# Patient Record
Sex: Female | Born: 1940 | Race: Black or African American | Hispanic: No | Smoking: Never smoker
Health system: Southern US, Community
[De-identification: ages and names within clinical notes are randomized; demographics above are authoritative.]

## PROBLEM LIST (undated history)

## (undated) DIAGNOSIS — K219 Gastro-esophageal reflux disease without esophagitis: Secondary | ICD-10-CM

## (undated) DIAGNOSIS — T451X5A Adverse effect of antineoplastic and immunosuppressive drugs, initial encounter: Secondary | ICD-10-CM

## (undated) DIAGNOSIS — Z87440 Personal history of urinary (tract) infections: Secondary | ICD-10-CM

## (undated) DIAGNOSIS — Z8619 Personal history of other infectious and parasitic diseases: Secondary | ICD-10-CM

## (undated) DIAGNOSIS — E876 Hypokalemia: Secondary | ICD-10-CM

## (undated) DIAGNOSIS — Z972 Presence of dental prosthetic device (complete) (partial): Secondary | ICD-10-CM

## (undated) DIAGNOSIS — Z9484 Stem cells transplant status: Secondary | ICD-10-CM

## (undated) DIAGNOSIS — N200 Calculus of kidney: Secondary | ICD-10-CM

## (undated) DIAGNOSIS — Z973 Presence of spectacles and contact lenses: Secondary | ICD-10-CM

## (undated) DIAGNOSIS — M109 Gout, unspecified: Secondary | ICD-10-CM

## (undated) DIAGNOSIS — G620 Drug-induced polyneuropathy: Secondary | ICD-10-CM

## (undated) DIAGNOSIS — R197 Diarrhea, unspecified: Secondary | ICD-10-CM

## (undated) DIAGNOSIS — E785 Hyperlipidemia, unspecified: Secondary | ICD-10-CM

## (undated) DIAGNOSIS — N201 Calculus of ureter: Secondary | ICD-10-CM

## (undated) DIAGNOSIS — Z9221 Personal history of antineoplastic chemotherapy: Secondary | ICD-10-CM

## (undated) DIAGNOSIS — M1990 Unspecified osteoarthritis, unspecified site: Secondary | ICD-10-CM

## (undated) DIAGNOSIS — C9001 Multiple myeloma in remission: Secondary | ICD-10-CM

## (undated) HISTORY — DX: Gastro-esophageal reflux disease without esophagitis: K21.9

## (undated) HISTORY — PX: CARPAL TUNNEL RELEASE: SHX101

## (undated) HISTORY — DX: Unspecified osteoarthritis, unspecified site: M19.90

## (undated) HISTORY — DX: Hyperlipidemia, unspecified: E78.5

## (undated) HISTORY — PX: APPENDECTOMY: SHX54

---

## 1992-04-01 HISTORY — PX: VAGINAL HYSTERECTOMY: SUR661

## 1998-03-13 ENCOUNTER — Other Ambulatory Visit: Admission: RE | Admit: 1998-03-13 | Discharge: 1998-03-13 | Payer: Self-pay | Admitting: *Deleted

## 1999-03-08 ENCOUNTER — Encounter: Admission: RE | Admit: 1999-03-08 | Discharge: 1999-03-08 | Payer: Self-pay | Admitting: *Deleted

## 1999-03-29 ENCOUNTER — Encounter: Admission: RE | Admit: 1999-03-29 | Discharge: 1999-03-29 | Payer: Self-pay | Admitting: *Deleted

## 1999-04-19 ENCOUNTER — Other Ambulatory Visit: Admission: RE | Admit: 1999-04-19 | Discharge: 1999-04-19 | Payer: Self-pay | Admitting: *Deleted

## 1999-11-19 ENCOUNTER — Encounter: Admission: RE | Admit: 1999-11-19 | Discharge: 1999-11-19 | Payer: Self-pay | Admitting: *Deleted

## 2000-11-24 ENCOUNTER — Encounter: Admission: RE | Admit: 2000-11-24 | Discharge: 2000-11-24 | Payer: Self-pay | Admitting: Internal Medicine

## 2000-11-24 ENCOUNTER — Encounter: Payer: Self-pay | Admitting: Internal Medicine

## 2001-09-22 ENCOUNTER — Ambulatory Visit (HOSPITAL_COMMUNITY): Admission: RE | Admit: 2001-09-22 | Discharge: 2001-09-22 | Payer: Self-pay | Admitting: *Deleted

## 2001-09-22 ENCOUNTER — Encounter (INDEPENDENT_AMBULATORY_CARE_PROVIDER_SITE_OTHER): Payer: Self-pay | Admitting: Specialist

## 2001-10-14 ENCOUNTER — Ambulatory Visit (HOSPITAL_BASED_OUTPATIENT_CLINIC_OR_DEPARTMENT_OTHER): Admission: RE | Admit: 2001-10-14 | Discharge: 2001-10-14 | Payer: Self-pay | Admitting: *Deleted

## 2001-12-11 ENCOUNTER — Encounter: Payer: Self-pay | Admitting: Internal Medicine

## 2001-12-11 ENCOUNTER — Encounter: Admission: RE | Admit: 2001-12-11 | Discharge: 2001-12-11 | Payer: Self-pay | Admitting: Internal Medicine

## 2005-06-17 ENCOUNTER — Encounter: Admission: RE | Admit: 2005-06-17 | Discharge: 2005-06-17 | Payer: Self-pay | Admitting: Internal Medicine

## 2007-09-23 ENCOUNTER — Encounter: Admission: RE | Admit: 2007-09-23 | Discharge: 2007-09-23 | Payer: Self-pay | Admitting: Internal Medicine

## 2008-06-04 ENCOUNTER — Inpatient Hospital Stay (HOSPITAL_COMMUNITY): Admission: EM | Admit: 2008-06-04 | Discharge: 2008-06-05 | Payer: Self-pay | Admitting: Emergency Medicine

## 2008-06-05 ENCOUNTER — Encounter (INDEPENDENT_AMBULATORY_CARE_PROVIDER_SITE_OTHER): Payer: Self-pay | Admitting: Cardiology

## 2008-12-23 ENCOUNTER — Encounter: Admission: RE | Admit: 2008-12-23 | Discharge: 2008-12-23 | Payer: Self-pay | Admitting: Internal Medicine

## 2009-01-31 ENCOUNTER — Ambulatory Visit: Payer: Self-pay | Admitting: Oncology

## 2009-02-03 LAB — CBC & DIFF AND RETIC
BASO%: 0.2 % (ref 0.0–2.0)
Basophils Absolute: 0 10*3/uL (ref 0.0–0.1)
EOS%: 0.6 % (ref 0.0–7.0)
Eosinophils Absolute: 0 10*3/uL (ref 0.0–0.5)
HCT: 31.3 % — ABNORMAL LOW (ref 34.8–46.6)
HGB: 10.1 g/dL — ABNORMAL LOW (ref 11.6–15.9)
Immature Retic Fract: 14.7 % — ABNORMAL HIGH (ref 0.00–10.70)
LYMPH%: 44.1 % (ref 14.0–49.7)
MCH: 30.9 pg (ref 25.1–34.0)
MCHC: 32.3 g/dL (ref 31.5–36.0)
MCV: 95.7 fL (ref 79.5–101.0)
MONO#: 0.3 10*3/uL (ref 0.1–0.9)
MONO%: 5.4 % (ref 0.0–14.0)
NEUT#: 2.7 10*3/uL (ref 1.5–6.5)
NEUT%: 49.7 % (ref 38.4–76.8)
Platelets: 222 10*3/uL (ref 145–400)
RBC: 3.27 10*6/uL — ABNORMAL LOW (ref 3.70–5.45)
RDW: 14 % (ref 11.2–14.5)
Retic %: 1.54 % — ABNORMAL HIGH (ref 0.50–1.50)
Retic Ct Abs: 50.36 10*3/uL (ref 18.30–72.70)
WBC: 5.4 10*3/uL (ref 3.9–10.3)
lymph#: 2.4 10*3/uL (ref 0.9–3.3)
nRBC: 0 % (ref 0–0)

## 2009-02-03 LAB — SEDIMENTATION RATE: Sed Rate: 31 mm/hr — ABNORMAL HIGH (ref 0–22)

## 2009-02-03 LAB — CHCC SMEAR

## 2009-02-08 LAB — LACTATE DEHYDROGENASE: LDH: 225 U/L (ref 94–250)

## 2009-02-08 LAB — IMMUNOFIXATION ELECTROPHORESIS
IgA: 68 mg/dL (ref 68–378)
IgG (Immunoglobin G), Serum: 868 mg/dL (ref 694–1618)
IgM, Serum: 22 mg/dL — ABNORMAL LOW (ref 60–263)
Total Protein, Serum Electrophoresis: 6.9 g/dL (ref 6.0–8.3)

## 2009-02-08 LAB — COMPREHENSIVE METABOLIC PANEL
ALT: 15 U/L (ref 0–35)
AST: 16 U/L (ref 0–37)
Albumin: 4.7 g/dL (ref 3.5–5.2)
Alkaline Phosphatase: 69 U/L (ref 39–117)
BUN: 13 mg/dL (ref 6–23)
CO2: 27 mEq/L (ref 19–32)
Calcium: 9.9 mg/dL (ref 8.4–10.5)
Chloride: 106 mEq/L (ref 96–112)
Creatinine, Ser: 0.93 mg/dL (ref 0.40–1.20)
Glucose, Bld: 109 mg/dL — ABNORMAL HIGH (ref 70–99)
Potassium: 4.1 mEq/L (ref 3.5–5.3)
Sodium: 144 mEq/L (ref 135–145)
Total Bilirubin: 0.4 mg/dL (ref 0.3–1.2)
Total Protein: 6.9 g/dL (ref 6.0–8.3)

## 2009-02-08 LAB — FOLATE RBC: RBC Folate: 855 ng/mL — ABNORMAL HIGH (ref 180–600)

## 2009-02-08 LAB — IRON AND TIBC
%SAT: 25 % (ref 20–55)
Iron: 75 ug/dL (ref 42–145)
TIBC: 297 ug/dL (ref 250–470)
UIBC: 222 ug/dL

## 2009-02-08 LAB — FERRITIN: Ferritin: 359 ng/mL — ABNORMAL HIGH (ref 10–291)

## 2009-02-08 LAB — VITAMIN B12: Vitamin B-12: 371 pg/mL (ref 211–911)

## 2009-02-08 LAB — TRANSFERRIN RECEPTOR, SOLUABLE: Transferrin Receptor, Soluble: 17.8 nmol/L

## 2009-03-03 ENCOUNTER — Ambulatory Visit: Payer: Self-pay | Admitting: Oncology

## 2009-03-03 LAB — CBC WITH DIFFERENTIAL/PLATELET
BASO%: 0.2 % (ref 0.0–2.0)
Basophils Absolute: 0 10*3/uL (ref 0.0–0.1)
EOS%: 0.7 % (ref 0.0–7.0)
Eosinophils Absolute: 0 10*3/uL (ref 0.0–0.5)
HCT: 30.9 % — ABNORMAL LOW (ref 34.8–46.6)
HGB: 9.8 g/dL — ABNORMAL LOW (ref 11.6–15.9)
LYMPH%: 47.3 % (ref 14.0–49.7)
MCH: 30.5 pg (ref 25.1–34.0)
MCHC: 31.7 g/dL (ref 31.5–36.0)
MCV: 96.3 fL (ref 79.5–101.0)
MONO#: 0.3 10*3/uL (ref 0.1–0.9)
MONO%: 5.7 % (ref 0.0–14.0)
NEUT#: 2.6 10*3/uL (ref 1.5–6.5)
NEUT%: 46.1 % (ref 38.4–76.8)
Platelets: 199 10*3/uL (ref 145–400)
RBC: 3.21 10*6/uL — ABNORMAL LOW (ref 3.70–5.45)
RDW: 14.3 % (ref 11.2–14.5)
WBC: 5.6 10*3/uL (ref 3.9–10.3)
lymph#: 2.7 10*3/uL (ref 0.9–3.3)
nRBC: 0 % (ref 0–0)

## 2009-03-09 LAB — UIFE/LIGHT CHAINS/TP QN, 24-HR UR
Albumin, U: DETECTED
Alpha 1, Urine: DETECTED — AB
Alpha 2, Urine: DETECTED — AB
Beta, Urine: DETECTED — AB
Free Kappa Lt Chains,Ur: 818 mg/dL — ABNORMAL HIGH (ref 0.04–1.51)
Free Kappa/Lambda Ratio: 1573.08 ratio — ABNORMAL HIGH (ref 0.46–4.00)
Free Lambda Excretion/Day: 9.36 mg/d
Free Lambda Lt Chains,Ur: 0.52 mg/dL (ref 0.08–1.01)
Free Lt Chn Excr Rate: 14724 mg/d
Gamma Globulin, Urine: DETECTED — AB
Time: 24 hours
Total Protein, Urine-Ur/day: 14796 mg/d — ABNORMAL HIGH (ref 10–140)
Total Protein, Urine: 822 mg/dL
Volume, Urine: 1800 mL

## 2009-03-09 LAB — CREATININE CLEARANCE, URINE, 24 HOUR
Collection Interval-CRCL: 24 hours
Creatinine Clearance: 115 mL/min (ref 75–115)
Creatinine, 24H Ur: 1628 mg/d (ref 700–1800)
Creatinine, Urine: 97.2 mg/dL
Creatinine: 0.98 mg/dL (ref 0.40–1.20)
Urine Total Volume-CRCL: 1675 mL

## 2009-03-30 ENCOUNTER — Ambulatory Visit (HOSPITAL_COMMUNITY): Admission: RE | Admit: 2009-03-30 | Discharge: 2009-03-30 | Payer: Self-pay | Admitting: Oncology

## 2009-03-30 ENCOUNTER — Ambulatory Visit: Payer: Self-pay | Admitting: Oncology

## 2009-04-05 ENCOUNTER — Ambulatory Visit: Payer: Self-pay | Admitting: Oncology

## 2009-04-07 LAB — CBC WITH DIFFERENTIAL/PLATELET
BASO%: 0.1 % (ref 0.0–2.0)
Basophils Absolute: 0 10*3/uL (ref 0.0–0.1)
EOS%: 0.8 % (ref 0.0–7.0)
Eosinophils Absolute: 0.1 10*3/uL (ref 0.0–0.5)
HCT: 29.2 % — ABNORMAL LOW (ref 34.8–46.6)
HGB: 9.8 g/dL — ABNORMAL LOW (ref 11.6–15.9)
LYMPH%: 48.3 % (ref 14.0–49.7)
MCH: 32.5 pg (ref 25.1–34.0)
MCHC: 33.5 g/dL (ref 31.5–36.0)
MCV: 96.9 fL (ref 79.5–101.0)
MONO#: 0.3 10*3/uL (ref 0.1–0.9)
MONO%: 5 % (ref 0.0–14.0)
NEUT#: 3.1 10*3/uL (ref 1.5–6.5)
NEUT%: 45.8 % (ref 38.4–76.8)
Platelets: 255 10*3/uL (ref 145–400)
RBC: 3.01 10*6/uL — ABNORMAL LOW (ref 3.70–5.45)
RDW: 14.2 % (ref 11.2–14.5)
WBC: 6.8 10*3/uL (ref 3.9–10.3)
lymph#: 3.3 10*3/uL (ref 0.9–3.3)

## 2009-04-07 LAB — COMPREHENSIVE METABOLIC PANEL
ALT: 14 U/L (ref 0–35)
AST: 18 U/L (ref 0–37)
Albumin: 4.1 g/dL (ref 3.5–5.2)
Alkaline Phosphatase: 68 U/L (ref 39–117)
BUN: 13 mg/dL (ref 6–23)
CO2: 29 mEq/L (ref 19–32)
Calcium: 9.5 mg/dL (ref 8.4–10.5)
Chloride: 106 mEq/L (ref 96–112)
Creatinine, Ser: 0.91 mg/dL (ref 0.40–1.20)
Glucose, Bld: 106 mg/dL — ABNORMAL HIGH (ref 70–99)
Potassium: 3.8 mEq/L (ref 3.5–5.3)
Sodium: 143 mEq/L (ref 135–145)
Total Bilirubin: 0.5 mg/dL (ref 0.3–1.2)
Total Protein: 7.2 g/dL (ref 6.0–8.3)

## 2009-04-07 LAB — LACTATE DEHYDROGENASE: LDH: 209 U/L (ref 94–250)

## 2009-04-10 LAB — CREATININE CLEARANCE, URINE, 24 HOUR
Collection Interval-CRCL: 24 hours
Creatinine Clearance: 134 mL/min — ABNORMAL HIGH (ref 75–115)
Creatinine, 24H Ur: 1733 mg/d (ref 700–1800)
Creatinine, Urine: 80.6 mg/dL
Creatinine: 0.9 mg/dL (ref 0.40–1.20)
Urine Total Volume-CRCL: 2150 mL

## 2009-04-10 LAB — SEDIMENTATION RATE: Sed Rate: 43 mm/hr — ABNORMAL HIGH (ref 0–22)

## 2009-04-10 LAB — IGG, IGA, IGM
IgA: 60 mg/dL — ABNORMAL LOW (ref 68–378)
IgG (Immunoglobin G), Serum: 774 mg/dL (ref 694–1618)
IgM, Serum: 24 mg/dL — ABNORMAL LOW (ref 60–263)

## 2009-04-10 LAB — PROTEIN, URINE, 24 HOUR
Collection Interval-UPROT: 24 hours
Protein, Ur: 1634 mg/d — ABNORMAL HIGH (ref 50–100)
Urine Total Volume-UPROT: 2150 mL

## 2009-04-10 LAB — KAPPA/LAMBDA LIGHT CHAINS
Kappa free light chain: 342 mg/dL — ABNORMAL HIGH (ref 0.33–1.94)
Lambda Free Lght Chn: 0.74 mg/dL (ref 0.57–2.63)

## 2009-04-18 ENCOUNTER — Ambulatory Visit: Admission: RE | Admit: 2009-04-18 | Discharge: 2009-04-18 | Payer: Self-pay | Admitting: Oncology

## 2009-04-18 ENCOUNTER — Encounter (HOSPITAL_COMMUNITY): Payer: Self-pay | Admitting: Oncology

## 2009-04-19 LAB — CBC WITH DIFFERENTIAL/PLATELET
BASO%: 0.4 % (ref 0.0–2.0)
Basophils Absolute: 0 10*3/uL (ref 0.0–0.1)
EOS%: 1.1 % (ref 0.0–7.0)
Eosinophils Absolute: 0.1 10*3/uL (ref 0.0–0.5)
HCT: 31.5 % — ABNORMAL LOW (ref 34.8–46.6)
HGB: 10.3 g/dL — ABNORMAL LOW (ref 11.6–15.9)
LYMPH%: 49.6 % (ref 14.0–49.7)
MCH: 32 pg (ref 25.1–34.0)
MCHC: 32.6 g/dL (ref 31.5–36.0)
MCV: 98.1 fL (ref 79.5–101.0)
MONO#: 0.3 10*3/uL (ref 0.1–0.9)
MONO%: 5.9 % (ref 0.0–14.0)
NEUT#: 2.3 10*3/uL (ref 1.5–6.5)
NEUT%: 43 % (ref 38.4–76.8)
Platelets: 203 10*3/uL (ref 145–400)
RBC: 3.21 10*6/uL — ABNORMAL LOW (ref 3.70–5.45)
RDW: 14.5 % (ref 11.2–14.5)
WBC: 5.2 10*3/uL (ref 3.9–10.3)
lymph#: 2.6 10*3/uL (ref 0.9–3.3)

## 2009-04-20 LAB — COMPREHENSIVE METABOLIC PANEL
ALT: 11 U/L (ref 0–35)
AST: 13 U/L (ref 0–37)
Albumin: 4.3 g/dL (ref 3.5–5.2)
Alkaline Phosphatase: 66 U/L (ref 39–117)
BUN: 15 mg/dL (ref 6–23)
CO2: 27 mEq/L (ref 19–32)
Calcium: 9.3 mg/dL (ref 8.4–10.5)
Chloride: 106 mEq/L (ref 96–112)
Creatinine, Ser: 1.02 mg/dL (ref 0.40–1.20)
Glucose, Bld: 106 mg/dL — ABNORMAL HIGH (ref 70–99)
Potassium: 3.5 mEq/L (ref 3.5–5.3)
Sodium: 144 mEq/L (ref 135–145)
Total Bilirubin: 0.3 mg/dL (ref 0.3–1.2)
Total Protein: 6.7 g/dL (ref 6.0–8.3)

## 2009-04-20 LAB — KAPPA/LAMBDA LIGHT CHAINS

## 2009-04-20 LAB — BETA 2 MICROGLOBULIN, SERUM: Beta-2 Microglobulin: 1.84 mg/L — ABNORMAL HIGH (ref 1.01–1.73)

## 2009-04-20 LAB — LACTATE DEHYDROGENASE: LDH: 209 U/L (ref 94–250)

## 2009-04-20 LAB — URIC ACID: Uric Acid, Serum: 7.3 mg/dL — ABNORMAL HIGH (ref 2.4–7.0)

## 2009-04-24 LAB — CBC WITH DIFFERENTIAL/PLATELET
BASO%: 0.2 % (ref 0.0–2.0)
Basophils Absolute: 0 10*3/uL (ref 0.0–0.1)
EOS%: 0.7 % (ref 0.0–7.0)
Eosinophils Absolute: 0 10*3/uL (ref 0.0–0.5)
HCT: 30.9 % — ABNORMAL LOW (ref 34.8–46.6)
HGB: 9.9 g/dL — ABNORMAL LOW (ref 11.6–15.9)
LYMPH%: 54 % — ABNORMAL HIGH (ref 14.0–49.7)
MCH: 30.9 pg (ref 25.1–34.0)
MCHC: 32 g/dL (ref 31.5–36.0)
MCV: 96.6 fL (ref 79.5–101.0)
MONO#: 0.4 10*3/uL (ref 0.1–0.9)
MONO%: 6.2 % (ref 0.0–14.0)
NEUT#: 2.2 10*3/uL (ref 1.5–6.5)
NEUT%: 38.9 % (ref 38.4–76.8)
Platelets: 178 10*3/uL (ref 145–400)
RBC: 3.2 10*6/uL — ABNORMAL LOW (ref 3.70–5.45)
RDW: 14.2 % (ref 11.2–14.5)
WBC: 5.7 10*3/uL (ref 3.9–10.3)
lymph#: 3.1 10*3/uL (ref 0.9–3.3)

## 2009-04-25 LAB — KAPPA/LAMBDA LIGHT CHAINS
Kappa free light chain: 342 mg/dL — ABNORMAL HIGH (ref 0.33–1.94)
Lambda Free Lght Chn: 0.72 mg/dL (ref 0.57–2.63)

## 2009-04-25 LAB — BETA 2 MICROGLOBULIN, SERUM: Beta-2 Microglobulin: 2.14 mg/L — ABNORMAL HIGH (ref 1.01–1.73)

## 2009-04-27 LAB — CBC WITH DIFFERENTIAL/PLATELET
BASO%: 0.3 % (ref 0.0–2.0)
Basophils Absolute: 0 10*3/uL (ref 0.0–0.1)
EOS%: 0.6 % (ref 0.0–7.0)
Eosinophils Absolute: 0 10*3/uL (ref 0.0–0.5)
HCT: 31.1 % — ABNORMAL LOW (ref 34.8–46.6)
HGB: 10 g/dL — ABNORMAL LOW (ref 11.6–15.9)
LYMPH%: 49.4 % (ref 14.0–49.7)
MCH: 30.9 pg (ref 25.1–34.0)
MCHC: 32.2 g/dL (ref 31.5–36.0)
MCV: 96 fL (ref 79.5–101.0)
MONO#: 0.5 10*3/uL (ref 0.1–0.9)
MONO%: 6.7 % (ref 0.0–14.0)
NEUT#: 3 10*3/uL (ref 1.5–6.5)
NEUT%: 43 % (ref 38.4–76.8)
Platelets: 190 10*3/uL (ref 145–400)
RBC: 3.24 10*6/uL — ABNORMAL LOW (ref 3.70–5.45)
RDW: 14.1 % (ref 11.2–14.5)
WBC: 6.9 10*3/uL (ref 3.9–10.3)
lymph#: 3.4 10*3/uL — ABNORMAL HIGH (ref 0.9–3.3)

## 2009-05-01 LAB — CBC WITH DIFFERENTIAL/PLATELET
BASO%: 0.1 % (ref 0.0–2.0)
Basophils Absolute: 0 10*3/uL (ref 0.0–0.1)
EOS%: 0.7 % (ref 0.0–7.0)
Eosinophils Absolute: 0.1 10*3/uL (ref 0.0–0.5)
HCT: 31.7 % — ABNORMAL LOW (ref 34.8–46.6)
HGB: 10.4 g/dL — ABNORMAL LOW (ref 11.6–15.9)
LYMPH%: 47.3 % (ref 14.0–49.7)
MCH: 31.4 pg (ref 25.1–34.0)
MCHC: 32.8 g/dL (ref 31.5–36.0)
MCV: 95.8 fL (ref 79.5–101.0)
MONO#: 0.5 10*3/uL (ref 0.1–0.9)
MONO%: 6.4 % (ref 0.0–14.0)
NEUT#: 3.4 10*3/uL (ref 1.5–6.5)
NEUT%: 45.5 % (ref 38.4–76.8)
Platelets: 198 10*3/uL (ref 145–400)
RBC: 3.31 10*6/uL — ABNORMAL LOW (ref 3.70–5.45)
RDW: 14 % (ref 11.2–14.5)
WBC: 7.5 10*3/uL (ref 3.9–10.3)
lymph#: 3.6 10*3/uL — ABNORMAL HIGH (ref 0.9–3.3)

## 2009-05-04 LAB — CBC WITH DIFFERENTIAL/PLATELET
BASO%: 0.1 % (ref 0.0–2.0)
Basophils Absolute: 0 10*3/uL (ref 0.0–0.1)
EOS%: 0.6 % (ref 0.0–7.0)
Eosinophils Absolute: 0 10*3/uL (ref 0.0–0.5)
HCT: 31.4 % — ABNORMAL LOW (ref 34.8–46.6)
HGB: 10 g/dL — ABNORMAL LOW (ref 11.6–15.9)
LYMPH%: 44 % (ref 14.0–49.7)
MCH: 30.7 pg (ref 25.1–34.0)
MCHC: 31.8 g/dL (ref 31.5–36.0)
MCV: 96.3 fL (ref 79.5–101.0)
MONO#: 0.4 10*3/uL (ref 0.1–0.9)
MONO%: 5.7 % (ref 0.0–14.0)
NEUT#: 3.6 10*3/uL (ref 1.5–6.5)
NEUT%: 49.6 % (ref 38.4–76.8)
Platelets: 176 10*3/uL (ref 145–400)
RBC: 3.26 10*6/uL — ABNORMAL LOW (ref 3.70–5.45)
RDW: 14.1 % (ref 11.2–14.5)
WBC: 7.2 10*3/uL (ref 3.9–10.3)
lymph#: 3.2 10*3/uL (ref 0.9–3.3)
nRBC: 1 % — ABNORMAL HIGH (ref 0–0)

## 2009-05-08 ENCOUNTER — Ambulatory Visit: Payer: Self-pay | Admitting: Oncology

## 2009-05-08 LAB — CBC WITH DIFFERENTIAL/PLATELET
BASO%: 0.3 % (ref 0.0–2.0)
Basophils Absolute: 0 10*3/uL (ref 0.0–0.1)
EOS%: 0.8 % (ref 0.0–7.0)
Eosinophils Absolute: 0 10*3/uL (ref 0.0–0.5)
HCT: 31.3 % — ABNORMAL LOW (ref 34.8–46.6)
HGB: 10.4 g/dL — ABNORMAL LOW (ref 11.6–15.9)
LYMPH%: 44.4 % (ref 14.0–49.7)
MCH: 32.5 pg (ref 25.1–34.0)
MCHC: 33.4 g/dL (ref 31.5–36.0)
MCV: 97.4 fL (ref 79.5–101.0)
MONO#: 0.3 10*3/uL (ref 0.1–0.9)
MONO%: 4.9 % (ref 0.0–14.0)
NEUT#: 3.1 10*3/uL (ref 1.5–6.5)
NEUT%: 49.6 % (ref 38.4–76.8)
Platelets: 150 10*3/uL (ref 145–400)
RBC: 3.21 10*6/uL — ABNORMAL LOW (ref 3.70–5.45)
RDW: 14.1 % (ref 11.2–14.5)
WBC: 6.2 10*3/uL (ref 3.9–10.3)
lymph#: 2.7 10*3/uL (ref 0.9–3.3)

## 2009-05-15 LAB — PROTEIN / CREATININE RATIO, URINE
Creatinine, Urine: 240.5 mg/dL
Protein Creatinine Ratio: 0.65 — ABNORMAL HIGH (ref <0.15)
Total Protein, Urine: 157 mg/dL

## 2009-05-15 LAB — CBC WITH DIFFERENTIAL/PLATELET
BASO%: 2.1 % — ABNORMAL HIGH (ref 0.0–2.0)
Basophils Absolute: 0.1 10*3/uL (ref 0.0–0.1)
EOS%: 0.9 % (ref 0.0–7.0)
Eosinophils Absolute: 0.1 10*3/uL (ref 0.0–0.5)
HCT: 31.8 % — ABNORMAL LOW (ref 34.8–46.6)
HGB: 10.4 g/dL — ABNORMAL LOW (ref 11.6–15.9)
LYMPH%: 50.2 % — ABNORMAL HIGH (ref 14.0–49.7)
MCH: 31.3 pg (ref 25.1–34.0)
MCHC: 32.7 g/dL (ref 31.5–36.0)
MCV: 95.8 fL (ref 79.5–101.0)
MONO#: 0.4 10*3/uL (ref 0.1–0.9)
MONO%: 6.3 % (ref 0.0–14.0)
NEUT#: 2.3 10*3/uL (ref 1.5–6.5)
NEUT%: 40.5 % (ref 38.4–76.8)
Platelets: 228 10*3/uL (ref 145–400)
RBC: 3.32 10*6/uL — ABNORMAL LOW (ref 3.70–5.45)
RDW: 14.1 % (ref 11.2–14.5)
WBC: 5.7 10*3/uL (ref 3.9–10.3)
lymph#: 2.9 10*3/uL (ref 0.9–3.3)
nRBC: 0 % (ref 0–0)

## 2009-05-17 LAB — COMPREHENSIVE METABOLIC PANEL
ALT: 12 U/L (ref 0–35)
AST: 13 U/L (ref 0–37)
Albumin: 4.5 g/dL (ref 3.5–5.2)
Alkaline Phosphatase: 62 U/L (ref 39–117)
BUN: 17 mg/dL (ref 6–23)
CO2: 25 mEq/L (ref 19–32)
Calcium: 9.6 mg/dL (ref 8.4–10.5)
Chloride: 108 mEq/L (ref 96–112)
Creatinine, Ser: 0.99 mg/dL (ref 0.40–1.20)
Glucose, Bld: 77 mg/dL (ref 70–99)
Potassium: 3.8 mEq/L (ref 3.5–5.3)
Sodium: 144 mEq/L (ref 135–145)
Total Bilirubin: 0.3 mg/dL (ref 0.3–1.2)
Total Protein: 6.8 g/dL (ref 6.0–8.3)

## 2009-05-17 LAB — KAPPA/LAMBDA LIGHT CHAINS
Kappa free light chain: 294 mg/dL — ABNORMAL HIGH (ref 0.33–1.94)
Lambda Free Lght Chn: <0.47 mg/dL — ABNORMAL LOW (ref 0.57–2.63)

## 2009-05-17 LAB — LACTATE DEHYDROGENASE: LDH: 249 U/L (ref 94–250)

## 2009-05-18 LAB — CBC WITH DIFFERENTIAL/PLATELET
BASO%: 0.3 % (ref 0.0–2.0)
Basophils Absolute: 0 10*3/uL (ref 0.0–0.1)
EOS%: 0 % (ref 0.0–7.0)
Eosinophils Absolute: 0 10*3/uL (ref 0.0–0.5)
HCT: 29.5 % — ABNORMAL LOW (ref 34.8–46.6)
HGB: 9.5 g/dL — ABNORMAL LOW (ref 11.6–15.9)
LYMPH%: 47.7 % (ref 14.0–49.7)
MCH: 30.5 pg (ref 25.1–34.0)
MCHC: 32.2 g/dL (ref 31.5–36.0)
MCV: 94.9 fL (ref 79.5–101.0)
MONO#: 0.3 10*3/uL (ref 0.1–0.9)
MONO%: 8.9 % (ref 0.0–14.0)
NEUT#: 1.4 10*3/uL — ABNORMAL LOW (ref 1.5–6.5)
NEUT%: 43.1 % (ref 38.4–76.8)
Platelets: 181 10*3/uL (ref 145–400)
RBC: 3.11 10*6/uL — ABNORMAL LOW (ref 3.70–5.45)
RDW: 14 % (ref 11.2–14.5)
WBC: 3.3 10*3/uL — ABNORMAL LOW (ref 3.9–10.3)
lymph#: 1.6 10*3/uL (ref 0.9–3.3)

## 2009-05-22 LAB — CBC WITH DIFFERENTIAL/PLATELET
BASO%: 0.4 % (ref 0.0–2.0)
Basophils Absolute: 0 10*3/uL (ref 0.0–0.1)
EOS%: 0 % (ref 0.0–7.0)
Eosinophils Absolute: 0 10*3/uL (ref 0.0–0.5)
HCT: 29.1 % — ABNORMAL LOW (ref 34.8–46.6)
HGB: 9.4 g/dL — ABNORMAL LOW (ref 11.6–15.9)
LYMPH%: 47.1 % (ref 14.0–49.7)
MCH: 30.9 pg (ref 25.1–34.0)
MCHC: 32.3 g/dL (ref 31.5–36.0)
MCV: 95.7 fL (ref 79.5–101.0)
MONO#: 0.1 10*3/uL (ref 0.1–0.9)
MONO%: 5.4 % (ref 0.0–14.0)
NEUT#: 1.2 10*3/uL — ABNORMAL LOW (ref 1.5–6.5)
NEUT%: 47.1 % (ref 38.4–76.8)
Platelets: 119 10*3/uL — ABNORMAL LOW (ref 145–400)
RBC: 3.04 10*6/uL — ABNORMAL LOW (ref 3.70–5.45)
RDW: 13.9 % (ref 11.2–14.5)
WBC: 2.6 10*3/uL — ABNORMAL LOW (ref 3.9–10.3)
lymph#: 1.2 10*3/uL (ref 0.9–3.3)

## 2009-05-25 LAB — CBC WITH DIFFERENTIAL/PLATELET
BASO%: 0.3 % (ref 0.0–2.0)
Basophils Absolute: 0 10*3/uL (ref 0.0–0.1)
EOS%: 0.3 % (ref 0.0–7.0)
Eosinophils Absolute: 0 10*3/uL (ref 0.0–0.5)
HCT: 26 % — ABNORMAL LOW (ref 34.8–46.6)
HGB: 8.4 g/dL — ABNORMAL LOW (ref 11.6–15.9)
LYMPH%: 58.8 % — ABNORMAL HIGH (ref 14.0–49.7)
MCH: 30.7 pg (ref 25.1–34.0)
MCHC: 32.3 g/dL (ref 31.5–36.0)
MCV: 94.9 fL (ref 79.5–101.0)
MONO#: 0.2 10*3/uL (ref 0.1–0.9)
MONO%: 5.9 % (ref 0.0–14.0)
NEUT#: 1.2 10*3/uL — ABNORMAL LOW (ref 1.5–6.5)
NEUT%: 34.7 % — ABNORMAL LOW (ref 38.4–76.8)
Platelets: 151 10*3/uL (ref 145–400)
RBC: 2.74 10*6/uL — ABNORMAL LOW (ref 3.70–5.45)
RDW: 13.9 % (ref 11.2–14.5)
WBC: 3.5 10*3/uL — ABNORMAL LOW (ref 3.9–10.3)
lymph#: 2.1 10*3/uL (ref 0.9–3.3)

## 2009-05-29 LAB — CBC WITH DIFFERENTIAL/PLATELET
BASO%: 0.3 % (ref 0.0–2.0)
Basophils Absolute: 0 10*3/uL (ref 0.0–0.1)
EOS%: 0.3 % (ref 0.0–7.0)
Eosinophils Absolute: 0 10*3/uL (ref 0.0–0.5)
HCT: 28.8 % — ABNORMAL LOW (ref 34.8–46.6)
HGB: 9.1 g/dL — ABNORMAL LOW (ref 11.6–15.9)
LYMPH%: 53.5 % — ABNORMAL HIGH (ref 14.0–49.7)
MCH: 29.9 pg (ref 25.1–34.0)
MCHC: 31.6 g/dL (ref 31.5–36.0)
MCV: 94.7 fL (ref 79.5–101.0)
MONO#: 0.1 10*3/uL (ref 0.1–0.9)
MONO%: 4 % (ref 0.0–14.0)
NEUT#: 1.4 10*3/uL — ABNORMAL LOW (ref 1.5–6.5)
NEUT%: 41.9 % (ref 38.4–76.8)
Platelets: 171 10*3/uL (ref 145–400)
RBC: 3.04 10*6/uL — ABNORMAL LOW (ref 3.70–5.45)
RDW: 13.8 % (ref 11.2–14.5)
WBC: 3.3 10*3/uL — ABNORMAL LOW (ref 3.9–10.3)
lymph#: 1.8 10*3/uL (ref 0.9–3.3)
nRBC: 0 % (ref 0–0)

## 2009-05-29 LAB — FECAL OCCULT BLOOD, GUAIAC: Occult Blood: NEGATIVE

## 2009-06-05 ENCOUNTER — Ambulatory Visit: Payer: Self-pay | Admitting: Oncology

## 2009-06-05 LAB — CBC WITH DIFFERENTIAL/PLATELET
BASO%: 0.3 % (ref 0.0–2.0)
Basophils Absolute: 0 10*3/uL (ref 0.0–0.1)
EOS%: 0.3 % (ref 0.0–7.0)
Eosinophils Absolute: 0 10*3/uL (ref 0.0–0.5)
HCT: 28 % — ABNORMAL LOW (ref 34.8–46.6)
HGB: 9 g/dL — ABNORMAL LOW (ref 11.6–15.9)
LYMPH%: 51.8 % — ABNORMAL HIGH (ref 14.0–49.7)
MCH: 30.8 pg (ref 25.1–34.0)
MCHC: 32.1 g/dL (ref 31.5–36.0)
MCV: 95.9 fL (ref 79.5–101.0)
MONO#: 0.3 10*3/uL (ref 0.1–0.9)
MONO%: 7.5 % (ref 0.0–14.0)
NEUT#: 1.4 10*3/uL — ABNORMAL LOW (ref 1.5–6.5)
NEUT%: 40.1 % (ref 38.4–76.8)
Platelets: 243 10*3/uL (ref 145–400)
RBC: 2.92 10*6/uL — ABNORMAL LOW (ref 3.70–5.45)
RDW: 14.5 % (ref 11.2–14.5)
WBC: 3.6 10*3/uL — ABNORMAL LOW (ref 3.9–10.3)
lymph#: 1.9 10*3/uL (ref 0.9–3.3)

## 2009-06-05 LAB — PROTEIN / CREATININE RATIO, URINE
Creatinine, Urine: 274.3 mg/dL
Protein Creatinine Ratio: 1.42 — ABNORMAL HIGH (ref <0.15)
Total Protein, Urine: 389 mg/dL

## 2009-06-06 LAB — COMPREHENSIVE METABOLIC PANEL
ALT: 19 U/L (ref 0–35)
AST: 15 U/L (ref 0–37)
Albumin: 4.3 g/dL (ref 3.5–5.2)
Alkaline Phosphatase: 69 U/L (ref 39–117)
BUN: 14 mg/dL (ref 6–23)
CO2: 23 mEq/L (ref 19–32)
Calcium: 9.2 mg/dL (ref 8.4–10.5)
Chloride: 106 mEq/L (ref 96–112)
Creatinine, Ser: 0.76 mg/dL (ref 0.40–1.20)
Glucose, Bld: 75 mg/dL (ref 70–99)
Potassium: 4.1 mEq/L (ref 3.5–5.3)
Sodium: 140 mEq/L (ref 135–145)
Total Bilirubin: 0.3 mg/dL (ref 0.3–1.2)
Total Protein: 6.9 g/dL (ref 6.0–8.3)

## 2009-06-06 LAB — KAPPA/LAMBDA LIGHT CHAINS
Kappa free light chain: 102 mg/dL — ABNORMAL HIGH (ref 0.33–1.94)
Lambda Free Lght Chn: <0.47 mg/dL — ABNORMAL LOW (ref 0.57–2.63)

## 2009-06-06 LAB — LACTATE DEHYDROGENASE: LDH: 283 U/L — ABNORMAL HIGH (ref 94–250)

## 2009-06-12 LAB — CBC WITH DIFFERENTIAL/PLATELET
BASO%: 0.2 % (ref 0.0–2.0)
Basophils Absolute: 0 10*3/uL (ref 0.0–0.1)
EOS%: 0.5 % (ref 0.0–7.0)
Eosinophils Absolute: 0 10*3/uL (ref 0.0–0.5)
HCT: 29.3 % — ABNORMAL LOW (ref 34.8–46.6)
HGB: 9.3 g/dL — ABNORMAL LOW (ref 11.6–15.9)
LYMPH%: 47 % (ref 14.0–49.7)
MCH: 30.6 pg (ref 25.1–34.0)
MCHC: 31.7 g/dL (ref 31.5–36.0)
MCV: 96.4 fL (ref 79.5–101.0)
MONO#: 0.6 10*3/uL (ref 0.1–0.9)
MONO%: 11.1 % (ref 0.0–14.0)
NEUT#: 2.3 10*3/uL (ref 1.5–6.5)
NEUT%: 41.2 % (ref 38.4–76.8)
Platelets: 231 10*3/uL (ref 145–400)
RBC: 3.04 10*6/uL — ABNORMAL LOW (ref 3.70–5.45)
RDW: 15.3 % — ABNORMAL HIGH (ref 11.2–14.5)
WBC: 5.6 10*3/uL (ref 3.9–10.3)
lymph#: 2.6 10*3/uL (ref 0.9–3.3)
nRBC: 0 % (ref 0–0)

## 2009-06-15 LAB — CBC WITH DIFFERENTIAL/PLATELET
BASO%: 0.1 % (ref 0.0–2.0)
Basophils Absolute: 0 10*3/uL (ref 0.0–0.1)
EOS%: 0.4 % (ref 0.0–7.0)
Eosinophils Absolute: 0 10*3/uL (ref 0.0–0.5)
HCT: 31.1 % — ABNORMAL LOW (ref 34.8–46.6)
HGB: 9.9 g/dL — ABNORMAL LOW (ref 11.6–15.9)
LYMPH%: 31.9 % (ref 14.0–49.7)
MCH: 31 pg (ref 25.1–34.0)
MCHC: 31.8 g/dL (ref 31.5–36.0)
MCV: 97.5 fL (ref 79.5–101.0)
MONO#: 0.9 10*3/uL (ref 0.1–0.9)
MONO%: 11.3 % (ref 0.0–14.0)
NEUT#: 4.4 10*3/uL (ref 1.5–6.5)
NEUT%: 56.3 % (ref 38.4–76.8)
Platelets: 222 10*3/uL (ref 145–400)
RBC: 3.19 10*6/uL — ABNORMAL LOW (ref 3.70–5.45)
RDW: 15.1 % — ABNORMAL HIGH (ref 11.2–14.5)
WBC: 7.8 10*3/uL (ref 3.9–10.3)
lymph#: 2.5 10*3/uL (ref 0.9–3.3)
nRBC: 1 % — ABNORMAL HIGH (ref 0–0)

## 2009-06-19 LAB — CBC WITH DIFFERENTIAL/PLATELET
BASO%: 0.1 % (ref 0.0–2.0)
Basophils Absolute: 0 10*3/uL (ref 0.0–0.1)
EOS%: 0.6 % (ref 0.0–7.0)
Eosinophils Absolute: 0 10*3/uL (ref 0.0–0.5)
HCT: 28.7 % — ABNORMAL LOW (ref 34.8–46.6)
HGB: 9.2 g/dL — ABNORMAL LOW (ref 11.6–15.9)
LYMPH%: 42.3 % (ref 14.0–49.7)
MCH: 30.8 pg (ref 25.1–34.0)
MCHC: 32.1 g/dL (ref 31.5–36.0)
MCV: 96 fL (ref 79.5–101.0)
MONO#: 0.6 10*3/uL (ref 0.1–0.9)
MONO%: 8.5 % (ref 0.0–14.0)
NEUT#: 3.4 10*3/uL (ref 1.5–6.5)
NEUT%: 48.5 % (ref 38.4–76.8)
Platelets: 170 10*3/uL (ref 145–400)
RBC: 2.99 10*6/uL — ABNORMAL LOW (ref 3.70–5.45)
RDW: 15.3 % — ABNORMAL HIGH (ref 11.2–14.5)
WBC: 7 10*3/uL (ref 3.9–10.3)
lymph#: 3 10*3/uL (ref 0.9–3.3)
nRBC: 1 % — ABNORMAL HIGH (ref 0–0)

## 2009-06-27 LAB — CBC WITH DIFFERENTIAL/PLATELET
BASO%: 0.3 % (ref 0.0–2.0)
Basophils Absolute: 0 10*3/uL (ref 0.0–0.1)
EOS%: 0.5 % (ref 0.0–7.0)
Eosinophils Absolute: 0 10*3/uL (ref 0.0–0.5)
HCT: 32 % — ABNORMAL LOW (ref 34.8–46.6)
HGB: 10.2 g/dL — ABNORMAL LOW (ref 11.6–15.9)
LYMPH%: 32.6 % (ref 14.0–49.7)
MCH: 31.2 pg (ref 25.1–34.0)
MCHC: 31.9 g/dL (ref 31.5–36.0)
MCV: 97.9 fL (ref 79.5–101.0)
MONO#: 0.4 10*3/uL (ref 0.1–0.9)
MONO%: 6.4 % (ref 0.0–14.0)
NEUT#: 3.8 10*3/uL (ref 1.5–6.5)
NEUT%: 60.2 % (ref 38.4–76.8)
Platelets: 189 10*3/uL (ref 145–400)
RBC: 3.27 10*6/uL — ABNORMAL LOW (ref 3.70–5.45)
RDW: 16 % — ABNORMAL HIGH (ref 11.2–14.5)
WBC: 6.3 10*3/uL (ref 3.9–10.3)
lymph#: 2.1 10*3/uL (ref 0.9–3.3)
nRBC: 0 % (ref 0–0)

## 2009-06-28 LAB — COMPREHENSIVE METABOLIC PANEL
ALT: 11 U/L (ref 0–35)
AST: 16 U/L (ref 0–37)
Albumin: 4.5 g/dL (ref 3.5–5.2)
Alkaline Phosphatase: 62 U/L (ref 39–117)
BUN: 16 mg/dL (ref 6–23)
CO2: 22 mEq/L (ref 19–32)
Calcium: 9.8 mg/dL (ref 8.4–10.5)
Chloride: 108 mEq/L (ref 96–112)
Creatinine, Ser: 0.86 mg/dL (ref 0.40–1.20)
Glucose, Bld: 95 mg/dL (ref 70–99)
Potassium: 4.1 mEq/L (ref 3.5–5.3)
Sodium: 143 mEq/L (ref 135–145)
Total Bilirubin: 0.3 mg/dL (ref 0.3–1.2)
Total Protein: 7 g/dL (ref 6.0–8.3)

## 2009-06-28 LAB — KAPPA/LAMBDA LIGHT CHAINS
Kappa free light chain: 237 mg/dL — ABNORMAL HIGH (ref 0.33–1.94)
Lambda Free Lght Chn: 0.65 mg/dL (ref 0.57–2.63)

## 2009-06-28 LAB — PROTEIN / CREATININE RATIO, URINE
Creatinine, Urine: 90.5 mg/dL
Protein Creatinine Ratio: 0.72 — ABNORMAL HIGH (ref <0.15)
Total Protein, Urine: 65 mg/dL

## 2009-06-28 LAB — LACTATE DEHYDROGENASE: LDH: 258 U/L — ABNORMAL HIGH (ref 94–250)

## 2009-06-30 LAB — CBC WITH DIFFERENTIAL/PLATELET
BASO%: 0.2 % (ref 0.0–2.0)
Basophils Absolute: 0 10*3/uL (ref 0.0–0.1)
EOS%: 0.3 % (ref 0.0–7.0)
Eosinophils Absolute: 0 10*3/uL (ref 0.0–0.5)
HCT: 28.8 % — ABNORMAL LOW (ref 34.8–46.6)
HGB: 9.3 g/dL — ABNORMAL LOW (ref 11.6–15.9)
LYMPH%: 37.3 % (ref 14.0–49.7)
MCH: 31.1 pg (ref 25.1–34.0)
MCHC: 32.3 g/dL (ref 31.5–36.0)
MCV: 96.3 fL (ref 79.5–101.0)
MONO#: 0.5 10*3/uL (ref 0.1–0.9)
MONO%: 7.9 % (ref 0.0–14.0)
NEUT#: 3.2 10*3/uL (ref 1.5–6.5)
NEUT%: 54.3 % (ref 38.4–76.8)
Platelets: 186 10*3/uL (ref 145–400)
RBC: 2.99 10*6/uL — ABNORMAL LOW (ref 3.70–5.45)
RDW: 15.8 % — ABNORMAL HIGH (ref 11.2–14.5)
WBC: 5.8 10*3/uL (ref 3.9–10.3)
lymph#: 2.2 10*3/uL (ref 0.9–3.3)
nRBC: 1 % — ABNORMAL HIGH (ref 0–0)

## 2009-07-03 LAB — CBC WITH DIFFERENTIAL/PLATELET
BASO%: 0.2 % (ref 0.0–2.0)
Basophils Absolute: 0 10*3/uL (ref 0.0–0.1)
EOS%: 0.7 % (ref 0.0–7.0)
Eosinophils Absolute: 0 10*3/uL (ref 0.0–0.5)
HCT: 29.1 % — ABNORMAL LOW (ref 34.8–46.6)
HGB: 9.4 g/dL — ABNORMAL LOW (ref 11.6–15.9)
LYMPH%: 43.4 % (ref 14.0–49.7)
MCH: 31.1 pg (ref 25.1–34.0)
MCHC: 32.3 g/dL (ref 31.5–36.0)
MCV: 96.4 fL (ref 79.5–101.0)
MONO#: 0.5 10*3/uL (ref 0.1–0.9)
MONO%: 8.1 % (ref 0.0–14.0)
NEUT#: 2.8 10*3/uL (ref 1.5–6.5)
NEUT%: 47.6 % (ref 38.4–76.8)
Platelets: 186 10*3/uL (ref 145–400)
RBC: 3.02 10*6/uL — ABNORMAL LOW (ref 3.70–5.45)
RDW: 15.6 % — ABNORMAL HIGH (ref 11.2–14.5)
WBC: 5.8 10*3/uL (ref 3.9–10.3)
lymph#: 2.5 10*3/uL (ref 0.9–3.3)

## 2009-07-06 ENCOUNTER — Ambulatory Visit: Payer: Self-pay | Admitting: Oncology

## 2009-07-06 LAB — CBC WITH DIFFERENTIAL/PLATELET
BASO%: 0.2 % (ref 0.0–2.0)
Basophils Absolute: 0 10*3/uL (ref 0.0–0.1)
EOS%: 0.2 % (ref 0.0–7.0)
Eosinophils Absolute: 0 10*3/uL (ref 0.0–0.5)
HCT: 28.6 % — ABNORMAL LOW (ref 34.8–46.6)
HGB: 9.2 g/dL — ABNORMAL LOW (ref 11.6–15.9)
LYMPH%: 44.9 % (ref 14.0–49.7)
MCH: 30.6 pg (ref 25.1–34.0)
MCHC: 32.2 g/dL (ref 31.5–36.0)
MCV: 95 fL (ref 79.5–101.0)
MONO#: 0.5 10*3/uL (ref 0.1–0.9)
MONO%: 8.2 % (ref 0.0–14.0)
NEUT#: 3 10*3/uL (ref 1.5–6.5)
NEUT%: 46.5 % (ref 38.4–76.8)
Platelets: 153 10*3/uL (ref 145–400)
RBC: 3.01 10*6/uL — ABNORMAL LOW (ref 3.70–5.45)
RDW: 15.7 % — ABNORMAL HIGH (ref 11.2–14.5)
WBC: 6.4 10*3/uL (ref 3.9–10.3)
lymph#: 2.9 10*3/uL (ref 0.9–3.3)
nRBC: 2 % — ABNORMAL HIGH (ref 0–0)

## 2009-07-24 ENCOUNTER — Ambulatory Visit: Admission: RE | Admit: 2009-07-24 | Discharge: 2009-07-24 | Payer: Self-pay | Admitting: Oncology

## 2009-07-24 ENCOUNTER — Ambulatory Visit: Payer: Self-pay | Admitting: Vascular Surgery

## 2009-07-24 ENCOUNTER — Encounter (HOSPITAL_COMMUNITY): Payer: Self-pay | Admitting: Oncology

## 2009-07-24 LAB — CBC WITH DIFFERENTIAL/PLATELET
BASO%: 0.5 % (ref 0.0–2.0)
Basophils Absolute: 0 10*3/uL (ref 0.0–0.1)
EOS%: 1.5 % (ref 0.0–7.0)
Eosinophils Absolute: 0.1 10*3/uL (ref 0.0–0.5)
HCT: 28.8 % — ABNORMAL LOW (ref 34.8–46.6)
HGB: 9.2 g/dL — ABNORMAL LOW (ref 11.6–15.9)
LYMPH%: 40.7 % (ref 14.0–49.7)
MCH: 31.1 pg (ref 25.1–34.0)
MCHC: 31.9 g/dL (ref 31.5–36.0)
MCV: 97.3 fL (ref 79.5–101.0)
MONO#: 0.4 10*3/uL (ref 0.1–0.9)
MONO%: 9 % (ref 0.0–14.0)
NEUT#: 1.9 10*3/uL (ref 1.5–6.5)
NEUT%: 48.3 % (ref 38.4–76.8)
Platelets: 267 10*3/uL (ref 145–400)
RBC: 2.96 10*6/uL — ABNORMAL LOW (ref 3.70–5.45)
RDW: 16.7 % — ABNORMAL HIGH (ref 11.2–14.5)
WBC: 4 10*3/uL (ref 3.9–10.3)
lymph#: 1.6 10*3/uL (ref 0.9–3.3)
nRBC: 0 % (ref 0–0)

## 2009-07-25 LAB — KAPPA/LAMBDA LIGHT CHAINS
Kappa free light chain: 242 mg/dL — ABNORMAL HIGH (ref 0.33–1.94)
Lambda Free Lght Chn: 0.64 mg/dL (ref 0.57–2.63)

## 2009-07-25 LAB — COMPREHENSIVE METABOLIC PANEL
ALT: 20 U/L (ref 0–35)
AST: 24 U/L (ref 0–37)
Albumin: 4.4 g/dL (ref 3.5–5.2)
Alkaline Phosphatase: 58 U/L (ref 39–117)
BUN: 11 mg/dL (ref 6–23)
CO2: 24 mEq/L (ref 19–32)
Calcium: 9.2 mg/dL (ref 8.4–10.5)
Chloride: 106 mEq/L (ref 96–112)
Creatinine, Ser: 0.75 mg/dL (ref 0.40–1.20)
Glucose, Bld: 88 mg/dL (ref 70–99)
Potassium: 4.2 mEq/L (ref 3.5–5.3)
Sodium: 141 mEq/L (ref 135–145)
Total Bilirubin: 0.3 mg/dL (ref 0.3–1.2)
Total Protein: 6.7 g/dL (ref 6.0–8.3)

## 2009-07-25 LAB — PROTEIN / CREATININE RATIO, URINE
Creatinine, Urine: 229.7 mg/dL
Protein Creatinine Ratio: 0.61 — ABNORMAL HIGH (ref <0.15)
Total Protein, Urine: 140 mg/dL

## 2009-07-25 LAB — LACTATE DEHYDROGENASE: LDH: 327 U/L — ABNORMAL HIGH (ref 94–250)

## 2009-07-27 LAB — CBC WITH DIFFERENTIAL/PLATELET
BASO%: 0.4 % (ref 0.0–2.0)
Basophils Absolute: 0 10*3/uL (ref 0.0–0.1)
EOS%: 0.8 % (ref 0.0–7.0)
Eosinophils Absolute: 0 10*3/uL (ref 0.0–0.5)
HCT: 30.3 % — ABNORMAL LOW (ref 34.8–46.6)
HGB: 9.9 g/dL — ABNORMAL LOW (ref 11.6–15.9)
LYMPH%: 40.7 % (ref 14.0–49.7)
MCH: 31.5 pg (ref 25.1–34.0)
MCHC: 32.7 g/dL (ref 31.5–36.0)
MCV: 96.5 fL (ref 79.5–101.0)
MONO#: 0.6 10*3/uL (ref 0.1–0.9)
MONO%: 11.5 % (ref 0.0–14.0)
NEUT#: 2.2 10*3/uL (ref 1.5–6.5)
NEUT%: 46.6 % (ref 38.4–76.8)
Platelets: 240 10*3/uL (ref 145–400)
RBC: 3.14 10*6/uL — ABNORMAL LOW (ref 3.70–5.45)
RDW: 16.4 % — ABNORMAL HIGH (ref 11.2–14.5)
WBC: 4.8 10*3/uL (ref 3.9–10.3)
lymph#: 2 10*3/uL (ref 0.9–3.3)

## 2009-07-31 ENCOUNTER — Ambulatory Visit (HOSPITAL_COMMUNITY): Admission: RE | Admit: 2009-07-31 | Discharge: 2009-07-31 | Payer: Self-pay | Admitting: Oncology

## 2009-07-31 ENCOUNTER — Ambulatory Visit: Payer: Self-pay | Admitting: Oncology

## 2009-07-31 LAB — CBC WITH DIFFERENTIAL/PLATELET
BASO%: 0.2 % (ref 0.0–2.0)
Basophils Absolute: 0 10*3/uL (ref 0.0–0.1)
EOS%: 0.9 % (ref 0.0–7.0)
Eosinophils Absolute: 0.1 10*3/uL (ref 0.0–0.5)
HCT: 30.5 % — ABNORMAL LOW (ref 34.8–46.6)
HGB: 9.8 g/dL — ABNORMAL LOW (ref 11.6–15.9)
LYMPH%: 47 % (ref 14.0–49.7)
MCH: 31.2 pg (ref 25.1–34.0)
MCHC: 32.1 g/dL (ref 31.5–36.0)
MCV: 97.1 fL (ref 79.5–101.0)
MONO#: 0.6 10*3/uL (ref 0.1–0.9)
MONO%: 10.5 % (ref 0.0–14.0)
NEUT#: 2.3 10*3/uL (ref 1.5–6.5)
NEUT%: 41.4 % (ref 38.4–76.8)
Platelets: 185 10*3/uL (ref 145–400)
RBC: 3.14 10*6/uL — ABNORMAL LOW (ref 3.70–5.45)
RDW: 16.1 % — ABNORMAL HIGH (ref 11.2–14.5)
WBC: 5.5 10*3/uL (ref 3.9–10.3)
lymph#: 2.6 10*3/uL (ref 0.9–3.3)
nRBC: 1 % — ABNORMAL HIGH (ref 0–0)

## 2009-08-01 LAB — UIFE/LIGHT CHAINS/TP QN, 24-HR UR
Albumin, U: DETECTED
Alpha 1, Urine: DETECTED — AB
Alpha 2, Urine: DETECTED — AB
Beta, Urine: DETECTED — AB
Free Kappa Lt Chains,Ur: 322 mg/dL — ABNORMAL HIGH (ref 0.04–1.51)
Free Kappa/Lambda Ratio: 2476.92 ratio — ABNORMAL HIGH (ref 0.46–4.00)
Free Lambda Excretion/Day: 3.25 mg/d
Free Lambda Lt Chains,Ur: 0.13 mg/dL (ref 0.08–1.01)
Free Lt Chn Excr Rate: 8050 mg/d
Gamma Globulin, Urine: DETECTED — AB
Time: 24 hours
Total Protein, Urine-Ur/day: 8100 mg/d — ABNORMAL HIGH (ref 10–140)
Total Protein, Urine: 324 mg/dL
Volume, Urine: 2500 mL

## 2009-08-03 ENCOUNTER — Ambulatory Visit (HOSPITAL_COMMUNITY): Admission: RE | Admit: 2009-08-03 | Discharge: 2009-08-03 | Payer: Self-pay | Admitting: Oncology

## 2009-08-17 ENCOUNTER — Ambulatory Visit: Payer: Self-pay | Admitting: Oncology

## 2009-08-18 LAB — CBC WITH DIFFERENTIAL/PLATELET
BASO%: 0.2 % (ref 0.0–2.0)
Basophils Absolute: 0 10*3/uL (ref 0.0–0.1)
EOS%: 0.6 % (ref 0.0–7.0)
Eosinophils Absolute: 0 10*3/uL (ref 0.0–0.5)
HCT: 32.2 % — ABNORMAL LOW (ref 34.8–46.6)
HGB: 10.4 g/dL — ABNORMAL LOW (ref 11.6–15.9)
LYMPH%: 42.3 % (ref 14.0–49.7)
MCH: 31.3 pg (ref 25.1–34.0)
MCHC: 32.3 g/dL (ref 31.5–36.0)
MCV: 97 fL (ref 79.5–101.0)
MONO#: 0.4 10*3/uL (ref 0.1–0.9)
MONO%: 8 % (ref 0.0–14.0)
NEUT#: 2.5 10*3/uL (ref 1.5–6.5)
NEUT%: 48.9 % (ref 38.4–76.8)
Platelets: 246 10*3/uL (ref 145–400)
RBC: 3.32 10*6/uL — ABNORMAL LOW (ref 3.70–5.45)
RDW: 15.2 % — ABNORMAL HIGH (ref 11.2–14.5)
WBC: 5.2 10*3/uL (ref 3.9–10.3)
lymph#: 2.2 10*3/uL (ref 0.9–3.3)

## 2009-08-19 LAB — PROTEIN / CREATININE RATIO, URINE
Creatinine, Urine: 265.8 mg/dL
Protein Creatinine Ratio: 0.96 — ABNORMAL HIGH (ref <0.15)
Total Protein, Urine: 255 mg/dL

## 2009-08-21 LAB — CBC WITH DIFFERENTIAL/PLATELET
BASO%: 0.3 % (ref 0.0–2.0)
Basophils Absolute: 0 10*3/uL (ref 0.0–0.1)
EOS%: 0.7 % (ref 0.0–7.0)
Eosinophils Absolute: 0 10*3/uL (ref 0.0–0.5)
HCT: 28.7 % — ABNORMAL LOW (ref 34.8–46.6)
HGB: 9.7 g/dL — ABNORMAL LOW (ref 11.6–15.9)
LYMPH%: 35 % (ref 14.0–49.7)
MCH: 33.2 pg (ref 25.1–34.0)
MCHC: 33.7 g/dL (ref 31.5–36.0)
MCV: 98.5 fL (ref 79.5–101.0)
MONO#: 0.4 10*3/uL (ref 0.1–0.9)
MONO%: 9.7 % (ref 0.0–14.0)
NEUT#: 2.2 10*3/uL (ref 1.5–6.5)
NEUT%: 54.3 % (ref 38.4–76.8)
Platelets: 222 10*3/uL (ref 145–400)
RBC: 2.91 10*6/uL — ABNORMAL LOW (ref 3.70–5.45)
RDW: 15.9 % — ABNORMAL HIGH (ref 11.2–14.5)
WBC: 4 10*3/uL (ref 3.9–10.3)
lymph#: 1.4 10*3/uL (ref 0.9–3.3)

## 2009-08-21 LAB — CK: Total CK: 174 U/L (ref 7–177)

## 2009-09-01 LAB — PROTIME-INR
INR: 1 — ABNORMAL LOW (ref 2.00–3.50)
Protime: 12 Seconds (ref 10.6–13.4)

## 2009-09-01 LAB — CBC WITH DIFFERENTIAL/PLATELET
BASO%: 0.4 % (ref 0.0–2.0)
Basophils Absolute: 0 10*3/uL (ref 0.0–0.1)
EOS%: 0.6 % (ref 0.0–7.0)
Eosinophils Absolute: 0 10*3/uL (ref 0.0–0.5)
HCT: 30.1 % — ABNORMAL LOW (ref 34.8–46.6)
HGB: 10.2 g/dL — ABNORMAL LOW (ref 11.6–15.9)
LYMPH%: 33.5 % (ref 14.0–49.7)
MCH: 33.1 pg (ref 25.1–34.0)
MCHC: 33.7 g/dL (ref 31.5–36.0)
MCV: 98 fL (ref 79.5–101.0)
MONO#: 0.6 10*3/uL (ref 0.1–0.9)
MONO%: 11.3 % (ref 0.0–14.0)
NEUT#: 2.7 10*3/uL (ref 1.5–6.5)
NEUT%: 54.2 % (ref 38.4–76.8)
Platelets: 207 10*3/uL (ref 145–400)
RBC: 3.08 10*6/uL — ABNORMAL LOW (ref 3.70–5.45)
RDW: 15 % — ABNORMAL HIGH (ref 11.2–14.5)
WBC: 4.9 10*3/uL (ref 3.9–10.3)
lymph#: 1.7 10*3/uL (ref 0.9–3.3)

## 2009-09-01 LAB — COMPREHENSIVE METABOLIC PANEL
ALT: <8 U/L (ref 0–35)
AST: 13 U/L (ref 0–37)
Albumin: 4.8 g/dL (ref 3.5–5.2)
Alkaline Phosphatase: 67 U/L (ref 39–117)
BUN: 32 mg/dL — ABNORMAL HIGH (ref 6–23)
CO2: 23 mEq/L (ref 19–32)
Calcium: 10 mg/dL (ref 8.4–10.5)
Chloride: 105 mEq/L (ref 96–112)
Creatinine, Ser: 1.69 mg/dL — ABNORMAL HIGH (ref 0.40–1.20)
Glucose, Bld: 101 mg/dL — ABNORMAL HIGH (ref 70–99)
Potassium: 4.7 mEq/L (ref 3.5–5.3)
Sodium: 140 mEq/L (ref 135–145)
Total Bilirubin: 0.3 mg/dL (ref 0.3–1.2)
Total Protein: 7.3 g/dL (ref 6.0–8.3)

## 2009-09-01 LAB — LACTATE DEHYDROGENASE: LDH: 307 U/L — ABNORMAL HIGH (ref 94–250)

## 2009-09-01 LAB — URIC ACID: Uric Acid, Serum: 4.6 mg/dL (ref 2.4–7.0)

## 2009-09-05 LAB — PROTIME-INR
INR: 1.1 — ABNORMAL LOW (ref 2.00–3.50)
Protime: 13.2 Seconds (ref 10.6–13.4)

## 2009-09-08 LAB — CBC WITH DIFFERENTIAL/PLATELET
BASO%: 0.2 % (ref 0.0–2.0)
Basophils Absolute: 0 10*3/uL (ref 0.0–0.1)
EOS%: 1.8 % (ref 0.0–7.0)
Eosinophils Absolute: 0.1 10*3/uL (ref 0.0–0.5)
HCT: 29 % — ABNORMAL LOW (ref 34.8–46.6)
HGB: 9.4 g/dL — ABNORMAL LOW (ref 11.6–15.9)
LYMPH%: 33.1 % (ref 14.0–49.7)
MCH: 31.4 pg (ref 25.1–34.0)
MCHC: 32.4 g/dL (ref 31.5–36.0)
MCV: 97 fL (ref 79.5–101.0)
MONO#: 0.4 10*3/uL (ref 0.1–0.9)
MONO%: 6.5 % (ref 0.0–14.0)
NEUT#: 3.2 10*3/uL (ref 1.5–6.5)
NEUT%: 58.4 % (ref 38.4–76.8)
Platelets: 184 10*3/uL (ref 145–400)
RBC: 2.99 10*6/uL — ABNORMAL LOW (ref 3.70–5.45)
RDW: 13.7 % (ref 11.2–14.5)
WBC: 5.5 10*3/uL (ref 3.9–10.3)
lymph#: 1.8 10*3/uL (ref 0.9–3.3)

## 2009-09-08 LAB — PROTIME-INR
INR: 1 — ABNORMAL LOW (ref 2.00–3.50)
Protime: 12 Seconds (ref 10.6–13.4)

## 2009-09-12 LAB — PROTIME-INR
INR: 1.1 — ABNORMAL LOW (ref 2.00–3.50)
Protime: 13.2 s (ref 10.6–13.4)

## 2009-09-15 LAB — CBC WITH DIFFERENTIAL/PLATELET
BASO%: 0.2 % (ref 0.0–2.0)
Basophils Absolute: 0 10*3/uL (ref 0.0–0.1)
EOS%: 1.4 % (ref 0.0–7.0)
Eosinophils Absolute: 0.1 10*3/uL (ref 0.0–0.5)
HCT: 30.2 % — ABNORMAL LOW (ref 34.8–46.6)
HGB: 9.9 g/dL — ABNORMAL LOW (ref 11.6–15.9)
LYMPH%: 31.5 % (ref 14.0–49.7)
MCH: 31.7 pg (ref 25.1–34.0)
MCHC: 32.8 g/dL (ref 31.5–36.0)
MCV: 96.8 fL (ref 79.5–101.0)
MONO#: 0.6 10*3/uL (ref 0.1–0.9)
MONO%: 9.8 % (ref 0.0–14.0)
NEUT#: 3.4 10*3/uL (ref 1.5–6.5)
NEUT%: 57.1 % (ref 38.4–76.8)
Platelets: 179 10*3/uL (ref 145–400)
RBC: 3.12 10*6/uL — ABNORMAL LOW (ref 3.70–5.45)
RDW: 13.4 % (ref 11.2–14.5)
WBC: 5.9 10*3/uL (ref 3.9–10.3)
lymph#: 1.9 10*3/uL (ref 0.9–3.3)

## 2009-09-15 LAB — PROTIME-INR
INR: 1.1 — ABNORMAL LOW (ref 2.00–3.50)
Protime: 13.2 Seconds (ref 10.6–13.4)

## 2009-09-20 ENCOUNTER — Ambulatory Visit: Payer: Self-pay | Admitting: Oncology

## 2009-09-22 LAB — CBC WITH DIFFERENTIAL/PLATELET
BASO%: 0.3 % (ref 0.0–2.0)
Basophils Absolute: 0 10*3/uL (ref 0.0–0.1)
EOS%: 3.5 % (ref 0.0–7.0)
Eosinophils Absolute: 0.1 10*3/uL (ref 0.0–0.5)
HCT: 29.2 % — ABNORMAL LOW (ref 34.8–46.6)
HGB: 9.5 g/dL — ABNORMAL LOW (ref 11.6–15.9)
LYMPH%: 49.8 % — ABNORMAL HIGH (ref 14.0–49.7)
MCH: 31.6 pg (ref 25.1–34.0)
MCHC: 32.5 g/dL (ref 31.5–36.0)
MCV: 97 fL (ref 79.5–101.0)
MONO#: 0.3 10*3/uL (ref 0.1–0.9)
MONO%: 8.6 % (ref 0.0–14.0)
NEUT#: 1.2 10*3/uL — ABNORMAL LOW (ref 1.5–6.5)
NEUT%: 37.8 % — ABNORMAL LOW (ref 38.4–76.8)
Platelets: 192 10*3/uL (ref 145–400)
RBC: 3.01 10*6/uL — ABNORMAL LOW (ref 3.70–5.45)
RDW: 13.7 % (ref 11.2–14.5)
WBC: 3.2 10*3/uL — ABNORMAL LOW (ref 3.9–10.3)
lymph#: 1.6 10*3/uL (ref 0.9–3.3)
nRBC: 0 % (ref 0–0)

## 2009-09-22 LAB — PROTIME-INR
INR: 2.5 (ref 2.00–3.50)
Protime: 30 Seconds — ABNORMAL HIGH (ref 10.6–13.4)

## 2009-09-25 LAB — PROTIME-INR
INR: 3 (ref 2.00–3.50)
Protime: 36 Seconds — ABNORMAL HIGH (ref 10.6–13.4)

## 2009-09-28 LAB — CBC WITH DIFFERENTIAL/PLATELET
BASO%: 0.6 % (ref 0.0–2.0)
Basophils Absolute: 0 10*3/uL (ref 0.0–0.1)
EOS%: 2.9 % (ref 0.0–7.0)
Eosinophils Absolute: 0.1 10*3/uL (ref 0.0–0.5)
HCT: 31.4 % — ABNORMAL LOW (ref 34.8–46.6)
HGB: 10 g/dL — ABNORMAL LOW (ref 11.6–15.9)
LYMPH%: 39.4 % (ref 14.0–49.7)
MCH: 31.6 pg (ref 25.1–34.0)
MCHC: 32 g/dL (ref 31.5–36.0)
MCV: 98.9 fL (ref 79.5–101.0)
MONO#: 0.2 10*3/uL (ref 0.1–0.9)
MONO%: 5.3 % (ref 0.0–14.0)
NEUT#: 2.1 10*3/uL (ref 1.5–6.5)
NEUT%: 51.8 % (ref 38.4–76.8)
Platelets: 210 10*3/uL (ref 145–400)
RBC: 3.18 10*6/uL — ABNORMAL LOW (ref 3.70–5.45)
RDW: 14.4 % (ref 11.2–14.5)
WBC: 4 10*3/uL (ref 3.9–10.3)
lymph#: 1.6 10*3/uL (ref 0.9–3.3)

## 2009-09-28 LAB — COMPREHENSIVE METABOLIC PANEL
ALT: 14 U/L (ref 0–35)
AST: 16 U/L (ref 0–37)
Albumin: 4 g/dL (ref 3.5–5.2)
Alkaline Phosphatase: 61 U/L (ref 39–117)
BUN: 14 mg/dL (ref 6–23)
CO2: 28 mEq/L (ref 19–32)
Calcium: 9.3 mg/dL (ref 8.4–10.5)
Chloride: 105 mEq/L (ref 96–112)
Creatinine, Ser: 0.94 mg/dL (ref 0.40–1.20)
Glucose, Bld: 122 mg/dL — ABNORMAL HIGH (ref 70–99)
Potassium: 4 mEq/L (ref 3.5–5.3)
Sodium: 139 mEq/L (ref 135–145)
Total Bilirubin: 0.7 mg/dL (ref 0.3–1.2)
Total Protein: 6.3 g/dL (ref 6.0–8.3)

## 2009-09-28 LAB — LACTATE DEHYDROGENASE: LDH: 184 U/L (ref 94–250)

## 2009-09-28 LAB — PROTIME-INR
INR: 1.3 — ABNORMAL LOW (ref 2.00–3.50)
Protime: 15.6 Seconds — ABNORMAL HIGH (ref 10.6–13.4)

## 2009-09-28 LAB — URIC ACID: Uric Acid, Serum: 3.1 mg/dL (ref 2.4–7.0)

## 2009-09-29 LAB — KAPPA/LAMBDA LIGHT CHAINS
Kappa free light chain: 145 mg/dL — ABNORMAL HIGH (ref 0.33–1.94)
Lambda Free Lght Chn: <0.42 mg/dL — ABNORMAL LOW (ref 0.57–2.63)

## 2009-09-29 LAB — PROTEIN / CREATININE RATIO, URINE
Creatinine, Urine: 162.4 mg/dL
Protein Creatinine Ratio: 0.35 — ABNORMAL HIGH (ref <0.15)
Total Protein, Urine: 57 mg/dL

## 2009-10-05 LAB — CBC WITH DIFFERENTIAL/PLATELET
BASO%: 0.7 % (ref 0.0–2.0)
Basophils Absolute: 0 10*3/uL (ref 0.0–0.1)
EOS%: 6.4 % (ref 0.0–7.0)
Eosinophils Absolute: 0.3 10*3/uL (ref 0.0–0.5)
HCT: 30.4 % — ABNORMAL LOW (ref 34.8–46.6)
HGB: 9.9 g/dL — ABNORMAL LOW (ref 11.6–15.9)
LYMPH%: 42.3 % (ref 14.0–49.7)
MCH: 31.6 pg (ref 25.1–34.0)
MCHC: 32.6 g/dL (ref 31.5–36.0)
MCV: 97.1 fL (ref 79.5–101.0)
MONO#: 0.2 10*3/uL (ref 0.1–0.9)
MONO%: 4.6 % (ref 0.0–14.0)
NEUT#: 1.9 10*3/uL (ref 1.5–6.5)
NEUT%: 46 % (ref 38.4–76.8)
Platelets: 141 10*3/uL — ABNORMAL LOW (ref 145–400)
RBC: 3.13 10*6/uL — ABNORMAL LOW (ref 3.70–5.45)
RDW: 13.8 % (ref 11.2–14.5)
WBC: 4.1 10*3/uL (ref 3.9–10.3)
lymph#: 1.7 10*3/uL (ref 0.9–3.3)
nRBC: 0 % (ref 0–0)

## 2009-10-05 LAB — PROTIME-INR
INR: 1.3 — ABNORMAL LOW (ref 2.00–3.50)
Protime: 15.6 Seconds — ABNORMAL HIGH (ref 10.6–13.4)

## 2009-10-12 LAB — CBC WITH DIFFERENTIAL/PLATELET
BASO%: 0.5 % (ref 0.0–2.0)
Basophils Absolute: 0 10*3/uL (ref 0.0–0.1)
EOS%: 4.6 % (ref 0.0–7.0)
Eosinophils Absolute: 0.2 10*3/uL (ref 0.0–0.5)
HCT: 30 % — ABNORMAL LOW (ref 34.8–46.6)
HGB: 9.8 g/dL — ABNORMAL LOW (ref 11.6–15.9)
LYMPH%: 55.2 % — ABNORMAL HIGH (ref 14.0–49.7)
MCH: 31.9 pg (ref 25.1–34.0)
MCHC: 32.7 g/dL (ref 31.5–36.0)
MCV: 97.7 fL (ref 79.5–101.0)
MONO#: 0.2 10*3/uL (ref 0.1–0.9)
MONO%: 5.2 % (ref 0.0–14.0)
NEUT#: 1.3 10*3/uL — ABNORMAL LOW (ref 1.5–6.5)
NEUT%: 34.5 % — ABNORMAL LOW (ref 38.4–76.8)
Platelets: 168 10*3/uL (ref 145–400)
RBC: 3.07 10*6/uL — ABNORMAL LOW (ref 3.70–5.45)
RDW: 13.7 % (ref 11.2–14.5)
WBC: 3.7 10*3/uL — ABNORMAL LOW (ref 3.9–10.3)
lymph#: 2 10*3/uL (ref 0.9–3.3)

## 2009-10-12 LAB — PROTIME-INR
INR: 1.3 — ABNORMAL LOW (ref 2.00–3.50)
Protime: 15.6 Seconds — ABNORMAL HIGH (ref 10.6–13.4)

## 2009-10-19 LAB — CBC WITH DIFFERENTIAL/PLATELET
BASO%: 0.6 % (ref 0.0–2.0)
Basophils Absolute: 0 10*3/uL (ref 0.0–0.1)
EOS%: 3.1 % (ref 0.0–7.0)
Eosinophils Absolute: 0.1 10*3/uL (ref 0.0–0.5)
HCT: 29.3 % — ABNORMAL LOW (ref 34.8–46.6)
HGB: 9.5 g/dL — ABNORMAL LOW (ref 11.6–15.9)
LYMPH%: 55.9 % — ABNORMAL HIGH (ref 14.0–49.7)
MCH: 31.4 pg (ref 25.1–34.0)
MCHC: 32.4 g/dL (ref 31.5–36.0)
MCV: 96.7 fL (ref 79.5–101.0)
MONO#: 0.1 10*3/uL (ref 0.1–0.9)
MONO%: 4 % (ref 0.0–14.0)
NEUT#: 1.2 10*3/uL — ABNORMAL LOW (ref 1.5–6.5)
NEUT%: 36.4 % — ABNORMAL LOW (ref 38.4–76.8)
Platelets: 214 10*3/uL (ref 145–400)
RBC: 3.03 10*6/uL — ABNORMAL LOW (ref 3.70–5.45)
RDW: 13.8 % (ref 11.2–14.5)
WBC: 3.2 10*3/uL — ABNORMAL LOW (ref 3.9–10.3)
lymph#: 1.8 10*3/uL (ref 0.9–3.3)

## 2009-10-19 LAB — PROTIME-INR
INR: 1.3 — ABNORMAL LOW (ref 2.00–3.50)
Protime: 15.6 Seconds — ABNORMAL HIGH (ref 10.6–13.4)

## 2009-10-23 ENCOUNTER — Ambulatory Visit: Payer: Self-pay | Admitting: Oncology

## 2009-10-23 LAB — PROTIME-INR
INR: 1.5 — ABNORMAL LOW (ref 2.00–3.50)
Protime: 18 Seconds — ABNORMAL HIGH (ref 10.6–13.4)

## 2009-10-26 LAB — CBC WITH DIFFERENTIAL/PLATELET
BASO%: 0.3 % (ref 0.0–2.0)
Basophils Absolute: 0 10*3/uL (ref 0.0–0.1)
EOS%: 2.8 % (ref 0.0–7.0)
Eosinophils Absolute: 0.1 10*3/uL (ref 0.0–0.5)
HCT: 29.3 % — ABNORMAL LOW (ref 34.8–46.6)
HGB: 10 g/dL — ABNORMAL LOW (ref 11.6–15.9)
LYMPH%: 49.9 % — ABNORMAL HIGH (ref 14.0–49.7)
MCH: 33.4 pg (ref 25.1–34.0)
MCHC: 34.1 g/dL (ref 31.5–36.0)
MCV: 97.9 fL (ref 79.5–101.0)
MONO#: 0.1 10*3/uL (ref 0.1–0.9)
MONO%: 4.6 % (ref 0.0–14.0)
NEUT#: 1.4 10*3/uL — ABNORMAL LOW (ref 1.5–6.5)
NEUT%: 42.4 % (ref 38.4–76.8)
Platelets: 225 10*3/uL (ref 145–400)
RBC: 2.99 10*6/uL — ABNORMAL LOW (ref 3.70–5.45)
RDW: 13.9 % (ref 11.2–14.5)
WBC: 3.2 10*3/uL — ABNORMAL LOW (ref 3.9–10.3)
lymph#: 1.6 10*3/uL (ref 0.9–3.3)

## 2009-10-26 LAB — PROTHROMBIN TIME
INR: 1.34 (ref <1.50)
Prothrombin Time: 16.5 seconds — ABNORMAL HIGH (ref 11.6–15.2)

## 2009-10-27 LAB — COMPREHENSIVE METABOLIC PANEL
ALT: 9 U/L (ref 0–35)
AST: 10 U/L (ref 0–37)
Albumin: 4.2 g/dL (ref 3.5–5.2)
Alkaline Phosphatase: 77 U/L (ref 39–117)
BUN: 13 mg/dL (ref 6–23)
CO2: 24 mEq/L (ref 19–32)
Calcium: 9 mg/dL (ref 8.4–10.5)
Chloride: 102 mEq/L (ref 96–112)
Creatinine, Ser: 0.84 mg/dL (ref 0.40–1.20)
Glucose, Bld: 98 mg/dL (ref 70–99)
Potassium: 4 mEq/L (ref 3.5–5.3)
Sodium: 138 mEq/L (ref 135–145)
Total Bilirubin: 0.3 mg/dL (ref 0.3–1.2)
Total Protein: 6.2 g/dL (ref 6.0–8.3)

## 2009-10-27 LAB — PROTEIN / CREATININE RATIO, URINE
Creatinine, Urine: 48.9 mg/dL
Protein Creatinine Ratio: 0.41 — ABNORMAL HIGH (ref <0.15)
Total Protein, Urine: 20 mg/dL

## 2009-10-27 LAB — KAPPA/LAMBDA LIGHT CHAINS
Kappa free light chain: 73.2 mg/dL — ABNORMAL HIGH (ref 0.33–1.94)
Lambda Free Lght Chn: <0.42 mg/dL — ABNORMAL LOW (ref 0.57–2.63)

## 2009-10-27 LAB — LACTATE DEHYDROGENASE: LDH: 178 U/L (ref 94–250)

## 2009-10-27 LAB — URIC ACID: Uric Acid, Serum: 3 mg/dL (ref 2.4–7.0)

## 2009-11-03 LAB — CBC WITH DIFFERENTIAL/PLATELET
BASO%: 0.4 % (ref 0.0–2.0)
Basophils Absolute: 0 10*3/uL (ref 0.0–0.1)
EOS%: 7.1 % — ABNORMAL HIGH (ref 0.0–7.0)
Eosinophils Absolute: 0.2 10*3/uL (ref 0.0–0.5)
HCT: 32.3 % — ABNORMAL LOW (ref 34.8–46.6)
HGB: 10.3 g/dL — ABNORMAL LOW (ref 11.6–15.9)
LYMPH%: 50 % — ABNORMAL HIGH (ref 14.0–49.7)
MCH: 31.1 pg (ref 25.1–34.0)
MCHC: 31.9 g/dL (ref 31.5–36.0)
MCV: 97.6 fL (ref 79.5–101.0)
MONO#: 0.2 10*3/uL (ref 0.1–0.9)
MONO%: 6 % (ref 0.0–14.0)
NEUT#: 1 10*3/uL — ABNORMAL LOW (ref 1.5–6.5)
NEUT%: 36.5 % — ABNORMAL LOW (ref 38.4–76.8)
Platelets: 188 10*3/uL (ref 145–400)
RBC: 3.31 10*6/uL — ABNORMAL LOW (ref 3.70–5.45)
RDW: 14.6 % — ABNORMAL HIGH (ref 11.2–14.5)
WBC: 2.7 10*3/uL — ABNORMAL LOW (ref 3.9–10.3)
lymph#: 1.3 10*3/uL (ref 0.9–3.3)

## 2009-11-03 LAB — PROTIME-INR
INR: 3 (ref 2.00–3.50)
Protime: 36 Seconds — ABNORMAL HIGH (ref 10.6–13.4)

## 2009-11-06 LAB — PROTIME-INR
INR: 4.2 — ABNORMAL HIGH (ref 2.00–3.50)
Protime: 50.4 Seconds — ABNORMAL HIGH (ref 10.6–13.4)

## 2009-11-10 LAB — CBC WITH DIFFERENTIAL/PLATELET
BASO%: 0.3 % (ref 0.0–2.0)
Basophils Absolute: 0 10*3/uL (ref 0.0–0.1)
EOS%: 2.3 % (ref 0.0–7.0)
Eosinophils Absolute: 0.1 10*3/uL (ref 0.0–0.5)
HCT: 29.8 % — ABNORMAL LOW (ref 34.8–46.6)
HGB: 9.5 g/dL — ABNORMAL LOW (ref 11.6–15.9)
LYMPH%: 44.8 % (ref 14.0–49.7)
MCH: 31.1 pg (ref 25.1–34.0)
MCHC: 31.9 g/dL (ref 31.5–36.0)
MCV: 97.7 fL (ref 79.5–101.0)
MONO#: 0.3 10*3/uL (ref 0.1–0.9)
MONO%: 8.4 % (ref 0.0–14.0)
NEUT#: 1.7 10*3/uL (ref 1.5–6.5)
NEUT%: 44.2 % (ref 38.4–76.8)
Platelets: 238 10*3/uL (ref 145–400)
RBC: 3.05 10*6/uL — ABNORMAL LOW (ref 3.70–5.45)
RDW: 14.5 % (ref 11.2–14.5)
WBC: 3.9 10*3/uL (ref 3.9–10.3)
lymph#: 1.8 10*3/uL (ref 0.9–3.3)

## 2009-11-10 LAB — PROTIME-INR
INR: 2.2 (ref 2.00–3.50)
Protime: 26.4 Seconds — ABNORMAL HIGH (ref 10.6–13.4)

## 2009-11-13 LAB — PROTIME-INR
INR: 3.4 (ref 2.00–3.50)
Protime: 40.8 Seconds — ABNORMAL HIGH (ref 10.6–13.4)

## 2009-11-17 LAB — CBC WITH DIFFERENTIAL/PLATELET
BASO%: 0.6 % (ref 0.0–2.0)
Basophils Absolute: 0 10*3/uL (ref 0.0–0.1)
EOS%: 3 % (ref 0.0–7.0)
Eosinophils Absolute: 0.1 10*3/uL (ref 0.0–0.5)
HCT: 29.5 % — ABNORMAL LOW (ref 34.8–46.6)
HGB: 9.4 g/dL — ABNORMAL LOW (ref 11.6–15.9)
LYMPH%: 35.8 % (ref 14.0–49.7)
MCH: 31 pg (ref 25.1–34.0)
MCHC: 31.9 g/dL (ref 31.5–36.0)
MCV: 97.4 fL (ref 79.5–101.0)
MONO#: 0.3 10*3/uL (ref 0.1–0.9)
MONO%: 7 % (ref 0.0–14.0)
NEUT#: 2.5 10*3/uL (ref 1.5–6.5)
NEUT%: 53.6 % (ref 38.4–76.8)
Platelets: 264 10*3/uL (ref 145–400)
RBC: 3.03 10*6/uL — ABNORMAL LOW (ref 3.70–5.45)
RDW: 14.9 % — ABNORMAL HIGH (ref 11.2–14.5)
WBC: 4.7 10*3/uL (ref 3.9–10.3)
lymph#: 1.7 10*3/uL (ref 0.9–3.3)

## 2009-11-17 LAB — PROTIME-INR
INR: 1.9 — ABNORMAL LOW (ref 2.00–3.50)
Protime: 22.8 Seconds — ABNORMAL HIGH (ref 10.6–13.4)

## 2009-11-21 LAB — PROTIME-INR
INR: 2.3 (ref 2.00–3.50)
Protime: 27.6 Seconds — ABNORMAL HIGH (ref 10.6–13.4)

## 2009-11-22 ENCOUNTER — Ambulatory Visit: Payer: Self-pay | Admitting: Oncology

## 2009-11-24 LAB — CBC WITH DIFFERENTIAL/PLATELET
BASO%: 0.5 % (ref 0.0–2.0)
Basophils Absolute: 0 10*3/uL (ref 0.0–0.1)
EOS%: 5.3 % (ref 0.0–7.0)
Eosinophils Absolute: 0.2 10*3/uL (ref 0.0–0.5)
HCT: 31.4 % — ABNORMAL LOW (ref 34.8–46.6)
HGB: 10 g/dL — ABNORMAL LOW (ref 11.6–15.9)
LYMPH%: 32.9 % (ref 14.0–49.7)
MCH: 31.1 pg (ref 25.1–34.0)
MCHC: 31.8 g/dL (ref 31.5–36.0)
MCV: 97.5 fL (ref 79.5–101.0)
MONO#: 0.3 10*3/uL (ref 0.1–0.9)
MONO%: 8 % (ref 0.0–14.0)
NEUT#: 2 10*3/uL (ref 1.5–6.5)
NEUT%: 53.3 % (ref 38.4–76.8)
Platelets: 250 10*3/uL (ref 145–400)
RBC: 3.22 10*6/uL — ABNORMAL LOW (ref 3.70–5.45)
RDW: 15 % — ABNORMAL HIGH (ref 11.2–14.5)
WBC: 3.7 10*3/uL — ABNORMAL LOW (ref 3.9–10.3)
lymph#: 1.2 10*3/uL (ref 0.9–3.3)
nRBC: 0 % (ref 0–0)

## 2009-11-24 LAB — PROTIME-INR
INR: 2.4 (ref 2.00–3.50)
Protime: 28.8 Seconds — ABNORMAL HIGH (ref 10.6–13.4)

## 2009-11-25 LAB — PROTEIN / CREATININE RATIO, URINE
Creatinine, Urine: 194.4 mg/dL
Protein Creatinine Ratio: 0.43 — ABNORMAL HIGH (ref <0.15)
Total Protein, Urine: 83 mg/dL

## 2009-11-27 LAB — COMPREHENSIVE METABOLIC PANEL
ALT: 13 U/L (ref 0–35)
AST: 13 U/L (ref 0–37)
Albumin: 4.1 g/dL (ref 3.5–5.2)
Alkaline Phosphatase: 81 U/L (ref 39–117)
BUN: 15 mg/dL (ref 6–23)
CO2: 24 mEq/L (ref 19–32)
Calcium: 9.1 mg/dL (ref 8.4–10.5)
Chloride: 106 mEq/L (ref 96–112)
Creatinine, Ser: 0.92 mg/dL (ref 0.40–1.20)
Glucose, Bld: 101 mg/dL — ABNORMAL HIGH (ref 70–99)
Potassium: 4 mEq/L (ref 3.5–5.3)
Sodium: 140 mEq/L (ref 135–145)
Total Bilirubin: 0.3 mg/dL (ref 0.3–1.2)
Total Protein: 6.2 g/dL (ref 6.0–8.3)

## 2009-11-27 LAB — LACTATE DEHYDROGENASE: LDH: 217 U/L (ref 94–250)

## 2009-11-27 LAB — KAPPA/LAMBDA LIGHT CHAINS
Kappa free light chain: 15.9 mg/dL — ABNORMAL HIGH (ref 0.33–1.94)
Kappa:Lambda Ratio: 17.87 — ABNORMAL HIGH (ref 0.26–1.65)
Lambda Free Lght Chn: 0.89 mg/dL (ref 0.57–2.63)

## 2009-12-01 LAB — CBC WITH DIFFERENTIAL/PLATELET
BASO%: 0.5 % (ref 0.0–2.0)
Basophils Absolute: 0 10*3/uL (ref 0.0–0.1)
EOS%: 9.5 % — ABNORMAL HIGH (ref 0.0–7.0)
Eosinophils Absolute: 0.3 10*3/uL (ref 0.0–0.5)
HCT: 30.5 % — ABNORMAL LOW (ref 34.8–46.6)
HGB: 10.1 g/dL — ABNORMAL LOW (ref 11.6–15.9)
LYMPH%: 37.8 % (ref 14.0–49.7)
MCH: 32.2 pg (ref 25.1–34.0)
MCHC: 33.2 g/dL (ref 31.5–36.0)
MCV: 97.2 fL (ref 79.5–101.0)
MONO#: 0.2 10*3/uL (ref 0.1–0.9)
MONO%: 5.5 % (ref 0.0–14.0)
NEUT#: 1.6 10*3/uL (ref 1.5–6.5)
NEUT%: 46.7 % (ref 38.4–76.8)
Platelets: 193 10*3/uL (ref 145–400)
RBC: 3.14 10*6/uL — ABNORMAL LOW (ref 3.70–5.45)
RDW: 16.7 % — ABNORMAL HIGH (ref 11.2–14.5)
WBC: 3.4 10*3/uL — ABNORMAL LOW (ref 3.9–10.3)
lymph#: 1.3 10*3/uL (ref 0.9–3.3)

## 2009-12-01 LAB — PROTIME-INR
INR: 2.2 (ref 2.00–3.50)
Protime: 26.4 Seconds — ABNORMAL HIGH (ref 10.6–13.4)

## 2009-12-06 LAB — UIFE/LIGHT CHAINS/TP QN, 24-HR UR
Albumin, U: DETECTED
Alpha 1, Urine: DETECTED — AB
Alpha 2, Urine: DETECTED — AB
Beta, Urine: DETECTED — AB
Free Kappa Lt Chains,Ur: 113 mg/dL — ABNORMAL HIGH (ref 0.04–1.51)
Free Kappa/Lambda Ratio: 513.64 ratio — ABNORMAL HIGH (ref 0.46–4.00)
Free Lambda Excretion/Day: 4.51 mg/d
Free Lambda Lt Chains,Ur: 0.22 mg/dL (ref 0.08–1.01)
Free Lt Chn Excr Rate: 2316.5 mg/d
Gamma Globulin, Urine: DETECTED — AB
Time: 24 hours
Total Protein, Urine-Ur/day: 2331 mg/d — ABNORMAL HIGH (ref 10–140)
Total Protein, Urine: 113.7 mg/dL
Volume, Urine: 2050 mL

## 2009-12-06 LAB — CREATININE CLEARANCE, URINE, 24 HOUR
Collection Interval-CRCL: 24 hours
Creatinine Clearance: 136 mL/min — ABNORMAL HIGH (ref 75–115)
Creatinine, 24H Ur: 1924 mg/d — ABNORMAL HIGH (ref 700–1800)
Creatinine, Urine: 93.9 mg/dL
Creatinine: 0.98 mg/dL (ref 0.40–1.20)
Urine Total Volume-CRCL: 2050 mL

## 2009-12-08 LAB — CBC WITH DIFFERENTIAL/PLATELET
BASO%: 0.6 % (ref 0.0–2.0)
Basophils Absolute: 0 10*3/uL (ref 0.0–0.1)
EOS%: 5.1 % (ref 0.0–7.0)
Eosinophils Absolute: 0.2 10*3/uL (ref 0.0–0.5)
HCT: 30.6 % — ABNORMAL LOW (ref 34.8–46.6)
HGB: 9.7 g/dL — ABNORMAL LOW (ref 11.6–15.9)
LYMPH%: 49.9 % — ABNORMAL HIGH (ref 14.0–49.7)
MCH: 30.8 pg (ref 25.1–34.0)
MCHC: 31.7 g/dL (ref 31.5–36.0)
MCV: 97.1 fL (ref 79.5–101.0)
MONO#: 0.3 10*3/uL (ref 0.1–0.9)
MONO%: 9.3 % (ref 0.0–14.0)
NEUT#: 1.3 10*3/uL — ABNORMAL LOW (ref 1.5–6.5)
NEUT%: 35.1 % — ABNORMAL LOW (ref 38.4–76.8)
Platelets: 206 10*3/uL (ref 145–400)
RBC: 3.15 10*6/uL — ABNORMAL LOW (ref 3.70–5.45)
RDW: 15.6 % — ABNORMAL HIGH (ref 11.2–14.5)
WBC: 3.6 10*3/uL — ABNORMAL LOW (ref 3.9–10.3)
lymph#: 1.8 10*3/uL (ref 0.9–3.3)

## 2009-12-08 LAB — PROTIME-INR
INR: 2.6 (ref 2.00–3.50)
Protime: 31.2 Seconds — ABNORMAL HIGH (ref 10.6–13.4)

## 2009-12-15 LAB — CBC WITH DIFFERENTIAL/PLATELET
BASO%: 2.2 % — ABNORMAL HIGH (ref 0.0–2.0)
Basophils Absolute: 0.1 10*3/uL (ref 0.0–0.1)
EOS%: 4.7 % (ref 0.0–7.0)
Eosinophils Absolute: 0.2 10*3/uL (ref 0.0–0.5)
HCT: 33.1 % — ABNORMAL LOW (ref 34.8–46.6)
HGB: 10.4 g/dL — ABNORMAL LOW (ref 11.6–15.9)
LYMPH%: 41.5 % (ref 14.0–49.7)
MCH: 30.9 pg (ref 25.1–34.0)
MCHC: 31.4 g/dL — ABNORMAL LOW (ref 31.5–36.0)
MCV: 98.2 fL (ref 79.5–101.0)
MONO#: 0.2 10*3/uL (ref 0.1–0.9)
MONO%: 5.4 % (ref 0.0–14.0)
NEUT#: 1.9 10*3/uL (ref 1.5–6.5)
NEUT%: 46.2 % (ref 38.4–76.8)
Platelets: 207 10*3/uL (ref 145–400)
RBC: 3.37 10*6/uL — ABNORMAL LOW (ref 3.70–5.45)
RDW: 16 % — ABNORMAL HIGH (ref 11.2–14.5)
WBC: 4.1 10*3/uL (ref 3.9–10.3)
lymph#: 1.7 10*3/uL (ref 0.9–3.3)
nRBC: 2 % — ABNORMAL HIGH (ref 0–0)

## 2009-12-15 LAB — PROTIME-INR
INR: 2.8 (ref 2.00–3.50)
Protime: 33.6 Seconds — ABNORMAL HIGH (ref 10.6–13.4)

## 2009-12-22 ENCOUNTER — Ambulatory Visit: Payer: Self-pay | Admitting: Oncology

## 2009-12-22 LAB — CBC WITH DIFFERENTIAL/PLATELET
BASO%: 0.5 % (ref 0.0–2.0)
Basophils Absolute: 0 10*3/uL (ref 0.0–0.1)
EOS%: 3.4 % (ref 0.0–7.0)
Eosinophils Absolute: 0.1 10*3/uL (ref 0.0–0.5)
HCT: 32.9 % — ABNORMAL LOW (ref 34.8–46.6)
HGB: 10.6 g/dL — ABNORMAL LOW (ref 11.6–15.9)
LYMPH%: 44.1 % (ref 14.0–49.7)
MCH: 31.4 pg (ref 25.1–34.0)
MCHC: 32.2 g/dL (ref 31.5–36.0)
MCV: 97.5 fL (ref 79.5–101.0)
MONO#: 0.2 10*3/uL (ref 0.1–0.9)
MONO%: 7.7 % (ref 0.0–14.0)
NEUT#: 1.4 10*3/uL — ABNORMAL LOW (ref 1.5–6.5)
NEUT%: 44.3 % (ref 38.4–76.8)
Platelets: 201 10*3/uL (ref 145–400)
RBC: 3.38 10*6/uL — ABNORMAL LOW (ref 3.70–5.45)
RDW: 17.2 % — ABNORMAL HIGH (ref 11.2–14.5)
WBC: 3.2 10*3/uL — ABNORMAL LOW (ref 3.9–10.3)
lymph#: 1.4 10*3/uL (ref 0.9–3.3)

## 2009-12-22 LAB — PROTIME-INR
INR: 4.6 — ABNORMAL HIGH (ref 2.00–3.50)
Protime: 55.2 Seconds — ABNORMAL HIGH (ref 10.6–13.4)

## 2009-12-23 LAB — PROTEIN / CREATININE RATIO, URINE
Creatinine, Urine: 271.5 mg/dL
Protein Creatinine Ratio: 0.27 — ABNORMAL HIGH (ref <0.15)
Total Protein, Urine: 73 mg/dL

## 2009-12-25 LAB — KAPPA/LAMBDA LIGHT CHAINS
Kappa free light chain: 10.1 mg/dL — ABNORMAL HIGH (ref 0.33–1.94)
Kappa:Lambda Ratio: 18.36 — ABNORMAL HIGH (ref 0.26–1.65)
Lambda Free Lght Chn: 0.55 mg/dL — ABNORMAL LOW (ref 0.57–2.63)

## 2009-12-25 LAB — COMPREHENSIVE METABOLIC PANEL
ALT: 13 U/L (ref 0–35)
AST: 12 U/L (ref 0–37)
Albumin: 4 g/dL (ref 3.5–5.2)
Alkaline Phosphatase: 90 U/L (ref 39–117)
BUN: 13 mg/dL (ref 6–23)
CO2: 24 mEq/L (ref 19–32)
Calcium: 9 mg/dL (ref 8.4–10.5)
Chloride: 102 mEq/L (ref 96–112)
Creatinine, Ser: 0.95 mg/dL (ref 0.40–1.20)
Glucose, Bld: 85 mg/dL (ref 70–99)
Potassium: 3.6 mEq/L (ref 3.5–5.3)
Sodium: 136 mEq/L (ref 135–145)
Total Bilirubin: 0.3 mg/dL (ref 0.3–1.2)
Total Protein: 5.8 g/dL — ABNORMAL LOW (ref 6.0–8.3)

## 2009-12-25 LAB — LACTATE DEHYDROGENASE: LDH: 195 U/L (ref 94–250)

## 2009-12-26 LAB — PROTIME-INR
INR: 1.6 — ABNORMAL LOW (ref 2.00–3.50)
Protime: 19.2 Seconds — ABNORMAL HIGH (ref 10.6–13.4)

## 2009-12-29 LAB — CBC WITH DIFFERENTIAL/PLATELET
BASO%: 0.6 % (ref 0.0–2.0)
Basophils Absolute: 0 10*3/uL (ref 0.0–0.1)
EOS%: 6.7 % (ref 0.0–7.0)
Eosinophils Absolute: 0.2 10*3/uL (ref 0.0–0.5)
HCT: 32.3 % — ABNORMAL LOW (ref 34.8–46.6)
HGB: 10.1 g/dL — ABNORMAL LOW (ref 11.6–15.9)
LYMPH%: 52.6 % — ABNORMAL HIGH (ref 14.0–49.7)
MCH: 30.7 pg (ref 25.1–34.0)
MCHC: 31.3 g/dL — ABNORMAL LOW (ref 31.5–36.0)
MCV: 98.2 fL (ref 79.5–101.0)
MONO#: 0.2 10*3/uL (ref 0.1–0.9)
MONO%: 6.1 % (ref 0.0–14.0)
NEUT#: 1.1 10*3/uL — ABNORMAL LOW (ref 1.5–6.5)
NEUT%: 34 % — ABNORMAL LOW (ref 38.4–76.8)
Platelets: 170 10*3/uL (ref 145–400)
RBC: 3.29 10*6/uL — ABNORMAL LOW (ref 3.70–5.45)
RDW: 15.7 % — ABNORMAL HIGH (ref 11.2–14.5)
WBC: 3.1 10*3/uL — ABNORMAL LOW (ref 3.9–10.3)
lymph#: 1.6 10*3/uL (ref 0.9–3.3)
nRBC: 0 % (ref 0–0)

## 2009-12-29 LAB — PROTIME-INR
INR: 2 (ref 2.00–3.50)
Protime: 24 Seconds — ABNORMAL HIGH (ref 10.6–13.4)

## 2010-01-05 LAB — CBC WITH DIFFERENTIAL/PLATELET
BASO%: 0.9 % (ref 0.0–2.0)
Basophils Absolute: 0 10*3/uL (ref 0.0–0.1)
EOS%: 1.8 % (ref 0.0–7.0)
Eosinophils Absolute: 0.1 10*3/uL (ref 0.0–0.5)
HCT: 32.8 % — ABNORMAL LOW (ref 34.8–46.6)
HGB: 10.3 g/dL — ABNORMAL LOW (ref 11.6–15.9)
LYMPH%: 54.8 % — ABNORMAL HIGH (ref 14.0–49.7)
MCH: 30.7 pg (ref 25.1–34.0)
MCHC: 31.4 g/dL — ABNORMAL LOW (ref 31.5–36.0)
MCV: 97.9 fL (ref 79.5–101.0)
MONO#: 0.3 10*3/uL (ref 0.1–0.9)
MONO%: 8.3 % (ref 0.0–14.0)
NEUT#: 1.2 10*3/uL — ABNORMAL LOW (ref 1.5–6.5)
NEUT%: 34.2 % — ABNORMAL LOW (ref 38.4–76.8)
Platelets: 238 10*3/uL (ref 145–400)
RBC: 3.35 10*6/uL — ABNORMAL LOW (ref 3.70–5.45)
RDW: 15.9 % — ABNORMAL HIGH (ref 11.2–14.5)
WBC: 3.4 10*3/uL — ABNORMAL LOW (ref 3.9–10.3)
lymph#: 1.8 10*3/uL (ref 0.9–3.3)
nRBC: 0 % (ref 0–0)

## 2010-01-05 LAB — PROTIME-INR
INR: 2.1 (ref 2.00–3.50)
Protime: 25.2 Seconds — ABNORMAL HIGH (ref 10.6–13.4)

## 2010-01-12 LAB — PROTIME-INR
INR: 2.1 (ref 2.00–3.50)
Protime: 25.2 Seconds — ABNORMAL HIGH (ref 10.6–13.4)

## 2010-01-12 LAB — CBC WITH DIFFERENTIAL/PLATELET
BASO%: 0.5 % (ref 0.0–2.0)
Basophils Absolute: 0 10*3/uL (ref 0.0–0.1)
EOS%: 2.1 % (ref 0.0–7.0)
Eosinophils Absolute: 0.1 10*3/uL (ref 0.0–0.5)
HCT: 32.9 % — ABNORMAL LOW (ref 34.8–46.6)
HGB: 10.2 g/dL — ABNORMAL LOW (ref 11.6–15.9)
LYMPH%: 44.9 % (ref 14.0–49.7)
MCH: 31.2 pg (ref 25.1–34.0)
MCHC: 31 g/dL — ABNORMAL LOW (ref 31.5–36.0)
MCV: 100.6 fL (ref 79.5–101.0)
MONO#: 0.3 10*3/uL (ref 0.1–0.9)
MONO%: 6.6 % (ref 0.0–14.0)
NEUT#: 1.8 10*3/uL (ref 1.5–6.5)
NEUT%: 45.9 % (ref 38.4–76.8)
Platelets: 236 10*3/uL (ref 145–400)
RBC: 3.27 10*6/uL — ABNORMAL LOW (ref 3.70–5.45)
RDW: 16.5 % — ABNORMAL HIGH (ref 11.2–14.5)
WBC: 3.8 10*3/uL — ABNORMAL LOW (ref 3.9–10.3)
lymph#: 1.7 10*3/uL (ref 0.9–3.3)

## 2010-01-17 ENCOUNTER — Ambulatory Visit: Payer: Self-pay | Admitting: Oncology

## 2010-01-18 LAB — CBC WITH DIFFERENTIAL/PLATELET
BASO%: 0.7 % (ref 0.0–2.0)
Basophils Absolute: 0 10*3/uL (ref 0.0–0.1)
EOS%: 2.1 % (ref 0.0–7.0)
Eosinophils Absolute: 0.1 10*3/uL (ref 0.0–0.5)
HCT: 34.4 % — ABNORMAL LOW (ref 34.8–46.6)
HGB: 11.2 g/dL — ABNORMAL LOW (ref 11.6–15.9)
LYMPH%: 35.6 % (ref 14.0–49.7)
MCH: 32.2 pg (ref 25.1–34.0)
MCHC: 32.6 g/dL (ref 31.5–36.0)
MCV: 98.8 fL (ref 79.5–101.0)
MONO#: 0.1 10*3/uL (ref 0.1–0.9)
MONO%: 2.9 % (ref 0.0–14.0)
NEUT#: 2.6 10*3/uL (ref 1.5–6.5)
NEUT%: 58.7 % (ref 38.4–76.8)
Platelets: 206 10*3/uL (ref 145–400)
RBC: 3.49 10*6/uL — ABNORMAL LOW (ref 3.70–5.45)
RDW: 18.4 % — ABNORMAL HIGH (ref 11.2–14.5)
WBC: 4.4 10*3/uL (ref 3.9–10.3)
lymph#: 1.5 10*3/uL (ref 0.9–3.3)

## 2010-01-18 LAB — PROTIME-INR
INR: 1.7 — ABNORMAL LOW (ref 2.00–3.50)
Protime: 20.4 Seconds — ABNORMAL HIGH (ref 10.6–13.4)

## 2010-01-19 LAB — COMPREHENSIVE METABOLIC PANEL
ALT: 10 U/L (ref 0–35)
AST: 12 U/L (ref 0–37)
Albumin: 4.3 g/dL (ref 3.5–5.2)
Alkaline Phosphatase: 87 U/L (ref 39–117)
BUN: 12 mg/dL (ref 6–23)
CO2: 23 mEq/L (ref 19–32)
Calcium: 9.3 mg/dL (ref 8.4–10.5)
Chloride: 106 mEq/L (ref 96–112)
Creatinine, Ser: 0.88 mg/dL (ref 0.40–1.20)
Glucose, Bld: 89 mg/dL (ref 70–99)
Potassium: 3.6 mEq/L (ref 3.5–5.3)
Sodium: 143 mEq/L (ref 135–145)
Total Bilirubin: 0.4 mg/dL (ref 0.3–1.2)
Total Protein: 6.2 g/dL (ref 6.0–8.3)

## 2010-01-19 LAB — KAPPA/LAMBDA LIGHT CHAINS
Kappa free light chain: 10.4 mg/dL — ABNORMAL HIGH (ref 0.33–1.94)
Kappa:Lambda Ratio: 20.39 — ABNORMAL HIGH (ref 0.26–1.65)
Lambda Free Lght Chn: 0.51 mg/dL — ABNORMAL LOW (ref 0.57–2.63)

## 2010-01-19 LAB — LACTATE DEHYDROGENASE: LDH: 210 U/L (ref 94–250)

## 2010-01-19 LAB — PROTEIN / CREATININE RATIO, URINE
Creatinine, Urine: 62.2 mg/dL
Protein Creatinine Ratio: 0.16 — ABNORMAL HIGH (ref <0.15)
Total Protein, Urine: 10 mg/dL

## 2010-01-26 LAB — CBC WITH DIFFERENTIAL/PLATELET
BASO%: 1.8 % (ref 0.0–2.0)
Basophils Absolute: 0.1 10*3/uL (ref 0.0–0.1)
EOS%: 6.2 % (ref 0.0–7.0)
Eosinophils Absolute: 0.2 10*3/uL (ref 0.0–0.5)
HCT: 33.4 % — ABNORMAL LOW (ref 34.8–46.6)
HGB: 10.7 g/dL — ABNORMAL LOW (ref 11.6–15.9)
LYMPH%: 36.1 % (ref 14.0–49.7)
MCH: 31.3 pg (ref 25.1–34.0)
MCHC: 32 g/dL (ref 31.5–36.0)
MCV: 97.7 fL (ref 79.5–101.0)
MONO#: 0.4 10*3/uL (ref 0.1–0.9)
MONO%: 11.4 % (ref 0.0–14.0)
NEUT#: 1.5 10*3/uL (ref 1.5–6.5)
NEUT%: 44.5 % (ref 38.4–76.8)
Platelets: 183 10*3/uL (ref 145–400)
RBC: 3.42 10*6/uL — ABNORMAL LOW (ref 3.70–5.45)
RDW: 16.1 % — ABNORMAL HIGH (ref 11.2–14.5)
WBC: 3.4 10*3/uL — ABNORMAL LOW (ref 3.9–10.3)
lymph#: 1.2 10*3/uL (ref 0.9–3.3)
nRBC: 1 % — ABNORMAL HIGH (ref 0–0)

## 2010-01-26 LAB — PROTIME-INR
INR: 2.4 (ref 2.00–3.50)
Protime: 28.8 Seconds — ABNORMAL HIGH (ref 10.6–13.4)

## 2010-02-02 LAB — CBC WITH DIFFERENTIAL/PLATELET
BASO%: 0.7 % (ref 0.0–2.0)
Basophils Absolute: 0 10*3/uL (ref 0.0–0.1)
EOS%: 2.7 % (ref 0.0–7.0)
Eosinophils Absolute: 0.1 10*3/uL (ref 0.0–0.5)
HCT: 34.2 % — ABNORMAL LOW (ref 34.8–46.6)
HGB: 10.8 g/dL — ABNORMAL LOW (ref 11.6–15.9)
LYMPH%: 48.8 % (ref 14.0–49.7)
MCH: 31 pg (ref 25.1–34.0)
MCHC: 31.6 g/dL (ref 31.5–36.0)
MCV: 98.3 fL (ref 79.5–101.0)
MONO#: 0.5 10*3/uL (ref 0.1–0.9)
MONO%: 10.9 % (ref 0.0–14.0)
NEUT#: 1.5 10*3/uL (ref 1.5–6.5)
NEUT%: 36.9 % — ABNORMAL LOW (ref 38.4–76.8)
Platelets: 210 10*3/uL (ref 145–400)
RBC: 3.48 10*6/uL — ABNORMAL LOW (ref 3.70–5.45)
RDW: 16.2 % — ABNORMAL HIGH (ref 11.2–14.5)
WBC: 4.1 10*3/uL (ref 3.9–10.3)
lymph#: 2 10*3/uL (ref 0.9–3.3)
nRBC: 0 % (ref 0–0)

## 2010-02-02 LAB — PROTIME-INR
INR: 2.1 (ref 2.00–3.50)
Protime: 25.2 Seconds — ABNORMAL HIGH (ref 10.6–13.4)

## 2010-02-09 LAB — CBC WITH DIFFERENTIAL/PLATELET
BASO%: 0.7 % (ref 0.0–2.0)
Basophils Absolute: 0 10*3/uL (ref 0.0–0.1)
EOS%: 2 % (ref 0.0–7.0)
Eosinophils Absolute: 0.1 10*3/uL (ref 0.0–0.5)
HCT: 32.9 % — ABNORMAL LOW (ref 34.8–46.6)
HGB: 10.4 g/dL — ABNORMAL LOW (ref 11.6–15.9)
LYMPH%: 40.2 % (ref 14.0–49.7)
MCH: 31.5 pg (ref 25.1–34.0)
MCHC: 31.6 g/dL (ref 31.5–36.0)
MCV: 99.7 fL (ref 79.5–101.0)
MONO#: 0.5 10*3/uL (ref 0.1–0.9)
MONO%: 12.7 % (ref 0.0–14.0)
NEUT#: 1.8 10*3/uL (ref 1.5–6.5)
NEUT%: 44.4 % (ref 38.4–76.8)
Platelets: 227 10*3/uL (ref 145–400)
RBC: 3.3 10*6/uL — ABNORMAL LOW (ref 3.70–5.45)
RDW: 16.4 % — ABNORMAL HIGH (ref 11.2–14.5)
WBC: 4.1 10*3/uL (ref 3.9–10.3)
lymph#: 1.7 10*3/uL (ref 0.9–3.3)

## 2010-02-09 LAB — PROTIME-INR
INR: 2 (ref 2.00–3.50)
Protime: 24 Seconds — ABNORMAL HIGH (ref 10.6–13.4)

## 2010-02-16 ENCOUNTER — Ambulatory Visit: Payer: Self-pay | Admitting: Oncology

## 2010-02-16 LAB — CBC WITH DIFFERENTIAL/PLATELET
BASO%: 0.6 % (ref 0.0–2.0)
Basophils Absolute: 0 10*3/uL (ref 0.0–0.1)
EOS%: 2.5 % (ref 0.0–7.0)
Eosinophils Absolute: 0.1 10*3/uL (ref 0.0–0.5)
HCT: 35.1 % (ref 34.8–46.6)
HGB: 11.1 g/dL — ABNORMAL LOW (ref 11.6–15.9)
LYMPH%: 33.4 % (ref 14.0–49.7)
MCH: 31.3 pg (ref 25.1–34.0)
MCHC: 31.6 g/dL (ref 31.5–36.0)
MCV: 98.9 fL (ref 79.5–101.0)
MONO#: 0.5 10*3/uL (ref 0.1–0.9)
MONO%: 9.7 % (ref 0.0–14.0)
NEUT#: 2.6 10*3/uL (ref 1.5–6.5)
NEUT%: 53.8 % (ref 38.4–76.8)
Platelets: 192 10*3/uL (ref 145–400)
RBC: 3.55 10*6/uL — ABNORMAL LOW (ref 3.70–5.45)
RDW: 16 % — ABNORMAL HIGH (ref 11.2–14.5)
WBC: 4.8 10*3/uL (ref 3.9–10.3)
lymph#: 1.6 10*3/uL (ref 0.9–3.3)

## 2010-02-16 LAB — PROTIME-INR
INR: 4.5 — ABNORMAL HIGH (ref 2.00–3.50)
Protime: 54 Seconds — ABNORMAL HIGH (ref 10.6–13.4)

## 2010-02-19 LAB — COMPREHENSIVE METABOLIC PANEL
ALT: 16 U/L (ref 0–35)
AST: 14 U/L (ref 0–37)
Albumin: 3.8 g/dL (ref 3.5–5.2)
Alkaline Phosphatase: 74 U/L (ref 39–117)
BUN: 19 mg/dL (ref 6–23)
CO2: 28 mEq/L (ref 19–32)
Calcium: 9.2 mg/dL (ref 8.4–10.5)
Chloride: 108 mEq/L (ref 96–112)
Creatinine, Ser: 1.17 mg/dL (ref 0.40–1.20)
Glucose, Bld: 81 mg/dL (ref 70–99)
Potassium: 3.3 mEq/L — ABNORMAL LOW (ref 3.5–5.3)
Sodium: 143 mEq/L (ref 135–145)
Total Bilirubin: 0.6 mg/dL (ref 0.3–1.2)
Total Protein: 6 g/dL (ref 6.0–8.3)

## 2010-02-19 LAB — CBC WITH DIFFERENTIAL/PLATELET
BASO%: 0 % (ref 0.0–2.0)
Basophils Absolute: 0 10*3/uL (ref 0.0–0.1)
EOS%: 1.6 % (ref 0.0–7.0)
Eosinophils Absolute: 0.1 10*3/uL (ref 0.0–0.5)
HCT: 32 % — ABNORMAL LOW (ref 34.8–46.6)
HGB: 10.5 g/dL — ABNORMAL LOW (ref 11.6–15.9)
LYMPH%: 32.3 % (ref 14.0–49.7)
MCH: 32 pg (ref 25.1–34.0)
MCHC: 32.8 g/dL (ref 31.5–36.0)
MCV: 97.4 fL (ref 79.5–101.0)
MONO#: 0.6 10*3/uL (ref 0.1–0.9)
MONO%: 11.9 % (ref 0.0–14.0)
NEUT#: 2.7 10*3/uL (ref 1.5–6.5)
NEUT%: 54.2 % (ref 38.4–76.8)
Platelets: 176 10*3/uL (ref 145–400)
RBC: 3.28 10*6/uL — ABNORMAL LOW (ref 3.70–5.45)
RDW: 17.8 % — ABNORMAL HIGH (ref 11.2–14.5)
WBC: 4.9 10*3/uL (ref 3.9–10.3)
lymph#: 1.6 10*3/uL (ref 0.9–3.3)

## 2010-02-19 LAB — LACTATE DEHYDROGENASE: LDH: 184 U/L (ref 94–250)

## 2010-02-19 LAB — PROTEIN / CREATININE RATIO, URINE
Creatinine, Urine: 214.2 mg/dL
Protein Creatinine Ratio: 0.14 (ref <0.15)
Total Protein, Urine: 31 mg/dL

## 2010-02-19 LAB — PROTIME-INR
INR: 2.9 (ref 2.00–3.50)
Protime: 34.8 Seconds — ABNORMAL HIGH (ref 10.6–13.4)

## 2010-02-20 LAB — KAPPA/LAMBDA LIGHT CHAINS
Kappa free light chain: 6.1 mg/dL — ABNORMAL HIGH (ref 0.33–1.94)
Lambda Free Lght Chn: <0.47 mg/dL — ABNORMAL LOW (ref 0.57–2.63)

## 2010-02-21 LAB — PROTIME-INR
INR: 2.9 (ref 2.00–3.50)
Protime: 34.8 Seconds — ABNORMAL HIGH (ref 10.6–13.4)

## 2010-02-26 LAB — PROTIME-INR
INR: 3.1 (ref 2.00–3.50)
Protime: 37.2 Seconds — ABNORMAL HIGH (ref 10.6–13.4)

## 2010-03-01 ENCOUNTER — Ambulatory Visit (HOSPITAL_COMMUNITY)
Admission: RE | Admit: 2010-03-01 | Discharge: 2010-03-01 | Payer: Self-pay | Source: Home / Self Care | Admitting: Oncology

## 2010-03-02 LAB — CBC WITH DIFFERENTIAL/PLATELET
BASO%: 0.4 % (ref 0.0–2.0)
Basophils Absolute: 0 10*3/uL (ref 0.0–0.1)
EOS%: 3.9 % (ref 0.0–7.0)
Eosinophils Absolute: 0.1 10*3/uL (ref 0.0–0.5)
HCT: 31.3 % — ABNORMAL LOW (ref 34.8–46.6)
HGB: 10.4 g/dL — ABNORMAL LOW (ref 11.6–15.9)
LYMPH%: 38.8 % (ref 14.0–49.7)
MCH: 32.1 pg (ref 25.1–34.0)
MCHC: 33.1 g/dL (ref 31.5–36.0)
MCV: 97 fL (ref 79.5–101.0)
MONO#: 0.3 10*3/uL (ref 0.1–0.9)
MONO%: 9.1 % (ref 0.0–14.0)
NEUT#: 1.5 10*3/uL (ref 1.5–6.5)
NEUT%: 47.8 % (ref 38.4–76.8)
Platelets: 198 10*3/uL (ref 145–400)
RBC: 3.23 10*6/uL — ABNORMAL LOW (ref 3.70–5.45)
RDW: 17.4 % — ABNORMAL HIGH (ref 11.2–14.5)
WBC: 3.2 10*3/uL — ABNORMAL LOW (ref 3.9–10.3)
lymph#: 1.3 10*3/uL (ref 0.9–3.3)

## 2010-03-02 LAB — PROTIME-INR
INR: 2.7 (ref 2.00–3.50)
Protime: 32.4 Seconds — ABNORMAL HIGH (ref 10.6–13.4)

## 2010-03-09 LAB — PROTIME-INR
INR: 2 (ref 2.00–3.50)
Protime: 24 Seconds — ABNORMAL HIGH (ref 10.6–13.4)

## 2010-03-16 LAB — PROTIME-INR
INR: 2 (ref 2.00–3.50)
Protime: 24 Seconds — ABNORMAL HIGH (ref 10.6–13.4)

## 2010-03-21 ENCOUNTER — Emergency Department (HOSPITAL_COMMUNITY)
Admission: EM | Admit: 2010-03-21 | Discharge: 2010-03-21 | Payer: Self-pay | Source: Home / Self Care | Admitting: Emergency Medicine

## 2010-03-22 ENCOUNTER — Ambulatory Visit: Payer: Self-pay | Admitting: Oncology

## 2010-03-23 LAB — PROTIME-INR
INR: 2.1 (ref 2.00–3.50)
Protime: 25.2 Seconds — ABNORMAL HIGH (ref 10.6–13.4)

## 2010-03-30 ENCOUNTER — Emergency Department (HOSPITAL_COMMUNITY)
Admission: EM | Admit: 2010-03-30 | Discharge: 2010-03-30 | Payer: Self-pay | Source: Home / Self Care | Admitting: Family Medicine

## 2010-03-30 LAB — PROTIME-INR
INR: 1.5 — ABNORMAL LOW (ref 2.00–3.50)
Protime: 18 Seconds — ABNORMAL HIGH (ref 10.6–13.4)

## 2010-04-03 LAB — CBC WITH DIFFERENTIAL/PLATELET
BASO%: 0.7 % (ref 0.0–2.0)
Basophils Absolute: 0 10*3/uL (ref 0.0–0.1)
EOS%: 0.9 % (ref 0.0–7.0)
Eosinophils Absolute: 0 10*3/uL (ref 0.0–0.5)
HCT: 31.9 % — ABNORMAL LOW (ref 34.8–46.6)
HGB: 10.3 g/dL — ABNORMAL LOW (ref 11.6–15.9)
LYMPH%: 37.1 % (ref 14.0–49.7)
MCH: 32.3 pg (ref 25.1–34.0)
MCHC: 32.3 g/dL (ref 31.5–36.0)
MCV: 99.9 fL (ref 79.5–101.0)
MONO#: 0.3 10*3/uL (ref 0.1–0.9)
MONO%: 7.3 % (ref 0.0–14.0)
NEUT#: 2 10*3/uL (ref 1.5–6.5)
NEUT%: 54 % (ref 38.4–76.8)
Platelets: 243 10*3/uL (ref 145–400)
RBC: 3.19 10*6/uL — ABNORMAL LOW (ref 3.70–5.45)
RDW: 17.5 % — ABNORMAL HIGH (ref 11.2–14.5)
WBC: 3.6 10*3/uL — ABNORMAL LOW (ref 3.9–10.3)
lymph#: 1.3 10*3/uL (ref 0.9–3.3)

## 2010-04-03 LAB — COMPREHENSIVE METABOLIC PANEL
ALT: 13 U/L (ref 0–35)
AST: 16 U/L (ref 0–37)
Albumin: 3.7 g/dL (ref 3.5–5.2)
Alkaline Phosphatase: 76 U/L (ref 39–117)
BUN: 15 mg/dL (ref 6–23)
CO2: 29 mEq/L (ref 19–32)
Calcium: 9.2 mg/dL (ref 8.4–10.5)
Chloride: 108 mEq/L (ref 96–112)
Creatinine, Ser: 0.98 mg/dL (ref 0.40–1.20)
Glucose, Bld: 89 mg/dL (ref 70–99)
Potassium: 4 mEq/L (ref 3.5–5.3)
Sodium: 143 mEq/L (ref 135–145)
Total Bilirubin: 0.4 mg/dL (ref 0.3–1.2)
Total Protein: 6 g/dL (ref 6.0–8.3)

## 2010-04-09 LAB — COMPREHENSIVE METABOLIC PANEL
ALT: 12 U/L (ref 0–35)
AST: 16 U/L (ref 0–37)
Albumin: 3.5 g/dL (ref 3.5–5.2)
Alkaline Phosphatase: 81 U/L (ref 39–117)
BUN: 14 mg/dL (ref 6–23)
CO2: 30 mEq/L (ref 19–32)
Calcium: 9 mg/dL (ref 8.4–10.5)
Chloride: 104 mEq/L (ref 96–112)
Creatinine, Ser: 0.98 mg/dL (ref 0.40–1.20)
Glucose, Bld: 140 mg/dL — ABNORMAL HIGH (ref 70–99)
Potassium: 3.4 mEq/L — ABNORMAL LOW (ref 3.5–5.3)
Sodium: 142 mEq/L (ref 135–145)
Total Bilirubin: 0.8 mg/dL (ref 0.3–1.2)
Total Protein: 5.8 g/dL — ABNORMAL LOW (ref 6.0–8.3)

## 2010-04-09 LAB — CBC WITH DIFFERENTIAL/PLATELET
BASO%: 0.4 % (ref 0.0–2.0)
Basophils Absolute: 0 10*3/uL (ref 0.0–0.1)
EOS%: 0.8 % (ref 0.0–7.0)
Eosinophils Absolute: 0 10*3/uL (ref 0.0–0.5)
HCT: 30 % — ABNORMAL LOW (ref 34.8–46.6)
HGB: 9.9 g/dL — ABNORMAL LOW (ref 11.6–15.9)
LYMPH%: 14.9 % (ref 14.0–49.7)
MCH: 32.5 pg (ref 25.1–34.0)
MCHC: 33 g/dL (ref 31.5–36.0)
MCV: 98.4 fL (ref 79.5–101.0)
MONO#: 0 10*3/uL — ABNORMAL LOW (ref 0.1–0.9)
MONO%: 1.1 % (ref 0.0–14.0)
NEUT#: 2.8 10*3/uL (ref 1.5–6.5)
NEUT%: 82.8 % — ABNORMAL HIGH (ref 38.4–76.8)
Platelets: 195 10*3/uL (ref 145–400)
RBC: 3.04 10*6/uL — ABNORMAL LOW (ref 3.70–5.45)
RDW: 18.2 % — ABNORMAL HIGH (ref 11.2–14.5)
WBC: 3.4 10*3/uL — ABNORMAL LOW (ref 3.9–10.3)
lymph#: 0.5 10*3/uL — ABNORMAL LOW (ref 0.9–3.3)

## 2010-04-11 LAB — CBC WITH DIFFERENTIAL/PLATELET
BASO%: 0.2 % (ref 0.0–2.0)
Basophils Absolute: 0 10*3/uL (ref 0.0–0.1)
EOS%: 0.8 % (ref 0.0–7.0)
Eosinophils Absolute: 0 10*3/uL (ref 0.0–0.5)
HCT: 31.7 % — ABNORMAL LOW (ref 34.8–46.6)
HGB: 10.3 g/dL — ABNORMAL LOW (ref 11.6–15.9)
LYMPH%: 8.9 % — ABNORMAL LOW (ref 14.0–49.7)
MCH: 32.1 pg (ref 25.1–34.0)
MCHC: 32.6 g/dL (ref 31.5–36.0)
MCV: 98.5 fL (ref 79.5–101.0)
MONO#: 0 10*3/uL — ABNORMAL LOW (ref 0.1–0.9)
MONO%: 0.7 % (ref 0.0–14.0)
NEUT#: 3 10*3/uL (ref 1.5–6.5)
NEUT%: 89.4 % — ABNORMAL HIGH (ref 38.4–76.8)
Platelets: 148 10*3/uL (ref 145–400)
RBC: 3.22 10*6/uL — ABNORMAL LOW (ref 3.70–5.45)
RDW: 17 % — ABNORMAL HIGH (ref 11.2–14.5)
WBC: 3.3 10*3/uL — ABNORMAL LOW (ref 3.9–10.3)
lymph#: 0.3 10*3/uL — ABNORMAL LOW (ref 0.9–3.3)

## 2010-04-11 LAB — COMPREHENSIVE METABOLIC PANEL
ALT: 13 U/L (ref 0–35)
AST: 16 U/L (ref 0–37)
Albumin: 3.6 g/dL (ref 3.5–5.2)
Alkaline Phosphatase: 86 U/L (ref 39–117)
BUN: 12 mg/dL (ref 6–23)
CO2: 29 mEq/L (ref 19–32)
Calcium: 9.1 mg/dL (ref 8.4–10.5)
Chloride: 107 mEq/L (ref 96–112)
Creatinine, Ser: 0.88 mg/dL (ref 0.40–1.20)
Glucose, Bld: 129 mg/dL — ABNORMAL HIGH (ref 70–99)
Potassium: 3.5 mEq/L (ref 3.5–5.3)
Sodium: 142 mEq/L (ref 135–145)
Total Bilirubin: 0.7 mg/dL (ref 0.3–1.2)
Total Protein: 6 g/dL (ref 6.0–8.3)

## 2010-04-13 LAB — CBC WITH DIFFERENTIAL/PLATELET
HCT: 29.8 % — ABNORMAL LOW (ref 34.8–46.6)
HGB: 9.6 g/dL — ABNORMAL LOW (ref 11.6–15.9)
MCH: 31.1 pg (ref 25.1–34.0)
MCHC: 32.2 g/dL (ref 31.5–36.0)
MCV: 96.4 fL (ref 79.5–101.0)
NEUT#: 0 10*3/uL — CL (ref 1.5–6.5)
Platelets: 82 10*3/uL — ABNORMAL LOW (ref 145–400)
RBC: 3.09 10*6/uL — ABNORMAL LOW (ref 3.70–5.45)
RDW: 15 % — ABNORMAL HIGH (ref 11.2–14.5)
WBC: 0.2 10*3/uL — CL (ref 3.9–10.3)

## 2010-04-13 LAB — PROTEIN / CREATININE RATIO, URINE
Creatinine, Urine: 310.3 mg/dL
Protein Creatinine Ratio: 0.41 — ABNORMAL HIGH (ref <0.15)
Total Protein, Urine: 128 mg/dL

## 2010-04-13 LAB — PROTIME-INR
INR: 1 — ABNORMAL LOW (ref 2.00–3.50)
Protime: 12 Seconds (ref 10.6–13.4)

## 2010-04-13 LAB — TECHNOLOGIST REVIEW

## 2010-04-16 LAB — COMPREHENSIVE METABOLIC PANEL
ALT: 16 U/L (ref 0–35)
AST: 15 U/L (ref 0–37)
Albumin: 3.7 g/dL (ref 3.5–5.2)
Alkaline Phosphatase: 88 U/L (ref 39–117)
BUN: 10 mg/dL (ref 6–23)
CO2: 27 mEq/L (ref 19–32)
Calcium: 9.3 mg/dL (ref 8.4–10.5)
Chloride: 108 mEq/L (ref 96–112)
Creatinine, Ser: 0.86 mg/dL (ref 0.40–1.20)
Glucose, Bld: 175 mg/dL — ABNORMAL HIGH (ref 70–99)
Potassium: 3.6 mEq/L (ref 3.5–5.3)
Sodium: 143 mEq/L (ref 135–145)
Total Bilirubin: 0.6 mg/dL (ref 0.3–1.2)
Total Protein: 6.1 g/dL (ref 6.0–8.3)

## 2010-04-16 LAB — CBC WITH DIFFERENTIAL/PLATELET
BASO%: 0 % (ref 0.0–2.0)
Basophils Absolute: 0 10*3/uL (ref 0.0–0.1)
EOS%: 2.4 % (ref 0.0–7.0)
Eosinophils Absolute: 0 10*3/uL (ref 0.0–0.5)
HCT: 27.8 % — ABNORMAL LOW (ref 34.8–46.6)
HGB: 8.9 g/dL — ABNORMAL LOW (ref 11.6–15.9)
LYMPH%: 47.6 % (ref 14.0–49.7)
MCH: 30.6 pg (ref 25.1–34.0)
MCHC: 32 g/dL (ref 31.5–36.0)
MCV: 95.5 fL (ref 79.5–101.0)
MONO#: 0.1 10*3/uL (ref 0.1–0.9)
MONO%: 11.9 % (ref 0.0–14.0)
NEUT#: 0.2 10*3/uL — CL (ref 1.5–6.5)
NEUT%: 38.1 % — ABNORMAL LOW (ref 38.4–76.8)
Platelets: 80 10*3/uL — ABNORMAL LOW (ref 145–400)
RBC: 2.91 10*6/uL — ABNORMAL LOW (ref 3.70–5.45)
RDW: 14.7 % — ABNORMAL HIGH (ref 11.2–14.5)
WBC: 0.4 10*3/uL — CL (ref 3.9–10.3)
lymph#: 0.2 10*3/uL — ABNORMAL LOW (ref 0.9–3.3)
nRBC: 0 % (ref 0–0)

## 2010-04-17 LAB — COMPREHENSIVE METABOLIC PANEL
ALT: 8 U/L (ref 0–35)
AST: 11 U/L (ref 0–37)
Albumin: 4.2 g/dL (ref 3.5–5.2)
Alkaline Phosphatase: 98 U/L (ref 39–117)
BUN: 13 mg/dL (ref 6–23)
CO2: 27 mEq/L (ref 19–32)
Calcium: 9 mg/dL (ref 8.4–10.5)
Chloride: 109 mEq/L (ref 96–112)
Creatinine, Ser: 0.79 mg/dL (ref 0.40–1.20)
Glucose, Bld: 142 mg/dL — ABNORMAL HIGH (ref 70–99)
Potassium: 3.6 mEq/L (ref 3.5–5.3)
Sodium: 143 mEq/L (ref 135–145)
Total Bilirubin: 0.5 mg/dL (ref 0.3–1.2)
Total Protein: 6.2 g/dL (ref 6.0–8.3)

## 2010-04-17 LAB — KAPPA/LAMBDA LIGHT CHAINS
Kappa free light chain: 87.6 mg/dL — ABNORMAL HIGH (ref 0.33–1.94)
Kappa:Lambda Ratio: 40.56 — ABNORMAL HIGH (ref 0.26–1.65)
Lambda Free Lght Chn: 2.16 mg/dL (ref 0.57–2.63)

## 2010-04-17 LAB — LACTATE DEHYDROGENASE: LDH: 154 U/L (ref 94–250)

## 2010-04-22 ENCOUNTER — Encounter (HOSPITAL_COMMUNITY): Payer: Self-pay | Admitting: Oncology

## 2010-05-21 ENCOUNTER — Encounter (HOSPITAL_BASED_OUTPATIENT_CLINIC_OR_DEPARTMENT_OTHER): Payer: MEDICARE | Admitting: Oncology

## 2010-05-21 ENCOUNTER — Other Ambulatory Visit (HOSPITAL_COMMUNITY): Payer: Self-pay | Admitting: Oncology

## 2010-05-21 DIAGNOSIS — H409 Unspecified glaucoma: Secondary | ICD-10-CM

## 2010-05-21 DIAGNOSIS — C9000 Multiple myeloma not having achieved remission: Secondary | ICD-10-CM

## 2010-05-21 DIAGNOSIS — Z7901 Long term (current) use of anticoagulants: Secondary | ICD-10-CM

## 2010-05-21 LAB — CBC WITH DIFFERENTIAL/PLATELET
BASO%: 0.3 % (ref 0.0–2.0)
Basophils Absolute: 0 10*3/uL (ref 0.0–0.1)
EOS%: 0.1 % (ref 0.0–7.0)
Eosinophils Absolute: 0 10*3/uL (ref 0.0–0.5)
HCT: 33.4 % — ABNORMAL LOW (ref 34.8–46.6)
HGB: 11.2 g/dL — ABNORMAL LOW (ref 11.6–15.9)
LYMPH%: 15.4 % (ref 14.0–49.7)
MCH: 32 pg (ref 25.1–34.0)
MCHC: 33.6 g/dL (ref 31.5–36.0)
MCV: 95.2 fL (ref 79.5–101.0)
MONO#: 0.8 10*3/uL (ref 0.1–0.9)
MONO%: 11.7 % (ref 0.0–14.0)
NEUT#: 5.1 10*3/uL (ref 1.5–6.5)
NEUT%: 72.5 % (ref 38.4–76.8)
Platelets: 292 10*3/uL (ref 145–400)
RBC: 3.51 10*6/uL — ABNORMAL LOW (ref 3.70–5.45)
RDW: 17.2 % — ABNORMAL HIGH (ref 11.2–14.5)
WBC: 7.1 10*3/uL (ref 3.9–10.3)
lymph#: 1.1 10*3/uL (ref 0.9–3.3)

## 2010-05-21 LAB — COMPREHENSIVE METABOLIC PANEL
ALT: 26 U/L (ref 0–35)
AST: 25 U/L (ref 0–37)
Albumin: 3.9 g/dL (ref 3.5–5.2)
Alkaline Phosphatase: 89 U/L (ref 39–117)
BUN: 14 mg/dL (ref 6–23)
CO2: 25 mEq/L (ref 19–32)
Calcium: 10.2 mg/dL (ref 8.4–10.5)
Chloride: 106 mEq/L (ref 96–112)
Creatinine, Ser: 1.25 mg/dL — ABNORMAL HIGH (ref 0.40–1.20)
Glucose, Bld: 138 mg/dL — ABNORMAL HIGH (ref 70–99)
Potassium: 4.7 mEq/L (ref 3.5–5.3)
Sodium: 140 mEq/L (ref 135–145)
Total Bilirubin: 0.6 mg/dL (ref 0.3–1.2)
Total Protein: 6.8 g/dL (ref 6.0–8.3)

## 2010-05-21 LAB — MAGNESIUM: Magnesium: 2.1 mg/dL (ref 1.5–2.5)

## 2010-05-21 LAB — LACTATE DEHYDROGENASE: LDH: 271 U/L — ABNORMAL HIGH (ref 94–250)

## 2010-05-28 ENCOUNTER — Other Ambulatory Visit (HOSPITAL_COMMUNITY): Payer: Self-pay | Admitting: Oncology

## 2010-05-28 ENCOUNTER — Encounter (HOSPITAL_BASED_OUTPATIENT_CLINIC_OR_DEPARTMENT_OTHER): Payer: MEDICARE | Admitting: Oncology

## 2010-05-28 DIAGNOSIS — C9000 Multiple myeloma not having achieved remission: Secondary | ICD-10-CM

## 2010-05-28 DIAGNOSIS — Z7901 Long term (current) use of anticoagulants: Secondary | ICD-10-CM

## 2010-05-28 LAB — COMPREHENSIVE METABOLIC PANEL
ALT: 22 U/L (ref 0–35)
AST: 27 U/L (ref 0–37)
Albumin: 3.8 g/dL (ref 3.5–5.2)
Alkaline Phosphatase: 88 U/L (ref 39–117)
BUN: 10 mg/dL (ref 6–23)
CO2: 26 mEq/L (ref 19–32)
Calcium: 9.7 mg/dL (ref 8.4–10.5)
Chloride: 107 mEq/L (ref 96–112)
Creatinine, Ser: 1.07 mg/dL (ref 0.40–1.20)
Glucose, Bld: 111 mg/dL — ABNORMAL HIGH (ref 70–99)
Potassium: 4.4 mEq/L (ref 3.5–5.3)
Sodium: 141 mEq/L (ref 135–145)
Total Bilirubin: 0.4 mg/dL (ref 0.3–1.2)
Total Protein: 6.2 g/dL (ref 6.0–8.3)

## 2010-05-28 LAB — CBC WITH DIFFERENTIAL/PLATELET
BASO%: 0.4 % (ref 0.0–2.0)
Basophils Absolute: 0 10*3/uL (ref 0.0–0.1)
EOS%: 1.2 % (ref 0.0–7.0)
Eosinophils Absolute: 0.1 10*3/uL (ref 0.0–0.5)
HCT: 31.9 % — ABNORMAL LOW (ref 34.8–46.6)
HGB: 10.3 g/dL — ABNORMAL LOW (ref 11.6–15.9)
LYMPH%: 36.6 % (ref 14.0–49.7)
MCH: 30.7 pg (ref 25.1–34.0)
MCHC: 32.3 g/dL (ref 31.5–36.0)
MCV: 94.9 fL (ref 79.5–101.0)
MONO#: 1.8 10*3/uL — ABNORMAL HIGH (ref 0.1–0.9)
MONO%: 21.9 % — ABNORMAL HIGH (ref 0.0–14.0)
NEUT#: 3.2 10*3/uL (ref 1.5–6.5)
NEUT%: 39.9 % (ref 38.4–76.8)
Platelets: 218 10*3/uL (ref 145–400)
RBC: 3.36 10*6/uL — ABNORMAL LOW (ref 3.70–5.45)
RDW: 17.2 % — ABNORMAL HIGH (ref 11.2–14.5)
WBC: 8.1 10*3/uL (ref 3.9–10.3)
lymph#: 3 10*3/uL (ref 0.9–3.3)
nRBC: 0 % (ref 0–0)

## 2010-05-28 LAB — TECHNOLOGIST REVIEW

## 2010-05-28 LAB — MAGNESIUM: Magnesium: 2.1 mg/dL (ref 1.5–2.5)

## 2010-06-04 ENCOUNTER — Encounter (HOSPITAL_BASED_OUTPATIENT_CLINIC_OR_DEPARTMENT_OTHER): Payer: MEDICARE | Admitting: Oncology

## 2010-06-04 ENCOUNTER — Other Ambulatory Visit (HOSPITAL_COMMUNITY): Payer: Self-pay | Admitting: Oncology

## 2010-06-04 DIAGNOSIS — C9000 Multiple myeloma not having achieved remission: Secondary | ICD-10-CM

## 2010-06-04 DIAGNOSIS — Z7901 Long term (current) use of anticoagulants: Secondary | ICD-10-CM

## 2010-06-04 LAB — CBC WITH DIFFERENTIAL/PLATELET
BASO%: 0.4 % (ref 0.0–2.0)
Basophils Absolute: 0 10*3/uL (ref 0.0–0.1)
EOS%: 1.9 % (ref 0.0–7.0)
Eosinophils Absolute: 0.1 10*3/uL (ref 0.0–0.5)
HCT: 30.6 % — ABNORMAL LOW (ref 34.8–46.6)
HGB: 10.1 g/dL — ABNORMAL LOW (ref 11.6–15.9)
LYMPH%: 35.5 % (ref 14.0–49.7)
MCH: 31.8 pg (ref 25.1–34.0)
MCHC: 33 g/dL (ref 31.5–36.0)
MCV: 96.5 fL (ref 79.5–101.0)
MONO#: 1.4 10*3/uL — ABNORMAL HIGH (ref 0.1–0.9)
MONO%: 17.7 % — ABNORMAL HIGH (ref 0.0–14.0)
NEUT#: 3.5 10*3/uL (ref 1.5–6.5)
NEUT%: 44.5 % (ref 38.4–76.8)
Platelets: 205 10*3/uL (ref 145–400)
RBC: 3.17 10*6/uL — ABNORMAL LOW (ref 3.70–5.45)
RDW: 19.4 % — ABNORMAL HIGH (ref 11.2–14.5)
WBC: 7.9 10*3/uL (ref 3.9–10.3)
lymph#: 2.8 10*3/uL (ref 0.9–3.3)

## 2010-06-04 LAB — COMPREHENSIVE METABOLIC PANEL
ALT: 29 U/L (ref 0–35)
AST: 30 U/L (ref 0–37)
Albumin: 3.7 g/dL (ref 3.5–5.2)
Alkaline Phosphatase: 91 U/L (ref 39–117)
BUN: 13 mg/dL (ref 6–23)
CO2: 26 mEq/L (ref 19–32)
Calcium: 9.6 mg/dL (ref 8.4–10.5)
Chloride: 108 mEq/L (ref 96–112)
Creatinine, Ser: 0.94 mg/dL (ref 0.40–1.20)
Glucose, Bld: 83 mg/dL (ref 70–99)
Potassium: 4.5 mEq/L (ref 3.5–5.3)
Sodium: 141 mEq/L (ref 135–145)
Total Bilirubin: 0.6 mg/dL (ref 0.3–1.2)
Total Protein: 6.3 g/dL (ref 6.0–8.3)

## 2010-06-04 LAB — MAGNESIUM: Magnesium: 2.2 mg/dL (ref 1.5–2.5)

## 2010-06-11 LAB — CBC
HCT: 33.3 % — ABNORMAL LOW (ref 36.0–46.0)
Hemoglobin: 10.3 g/dL — ABNORMAL LOW (ref 12.0–15.0)
MCH: 30.5 pg (ref 26.0–34.0)
MCHC: 30.9 g/dL (ref 30.0–36.0)
MCV: 98.5 fL (ref 78.0–100.0)
Platelets: 203 10*3/uL (ref 150–400)
RBC: 3.38 MIL/uL — ABNORMAL LOW (ref 3.87–5.11)
RDW: 15.7 % — ABNORMAL HIGH (ref 11.5–15.5)
WBC: 3.2 10*3/uL — ABNORMAL LOW (ref 4.0–10.5)

## 2010-06-11 LAB — POCT URINALYSIS DIPSTICK
Bilirubin Urine: NEGATIVE
Glucose, UA: NEGATIVE mg/dL
Ketones, ur: NEGATIVE mg/dL
Nitrite: NEGATIVE
Protein, ur: 100 mg/dL — AB
Specific Gravity, Urine: 1.02 (ref 1.005–1.030)
Urobilinogen, UA: 1 mg/dL (ref 0.0–1.0)
pH: 7 (ref 5.0–8.0)

## 2010-06-11 LAB — COMPREHENSIVE METABOLIC PANEL
ALT: 17 U/L (ref 0–35)
AST: 13 U/L (ref 0–37)
Albumin: 3.5 g/dL (ref 3.5–5.2)
Alkaline Phosphatase: 77 U/L (ref 39–117)
BUN: 11 mg/dL (ref 6–23)
CO2: 26 mEq/L (ref 19–32)
Calcium: 9.1 mg/dL (ref 8.4–10.5)
Chloride: 110 mEq/L (ref 96–112)
Creatinine, Ser: 0.93 mg/dL (ref 0.4–1.2)
GFR calc Af Amer: >60 mL/min (ref >60)
GFR calc non Af Amer: >60 mL/min (ref >60)
Glucose, Bld: 152 mg/dL — ABNORMAL HIGH (ref 70–99)
Potassium: 3.7 mEq/L (ref 3.5–5.1)
Sodium: 143 mEq/L (ref 135–145)
Total Bilirubin: 0.3 mg/dL (ref 0.3–1.2)
Total Protein: 6.1 g/dL (ref 6.0–8.3)

## 2010-06-11 LAB — URINALYSIS, ROUTINE W REFLEX MICROSCOPIC
Bilirubin Urine: NEGATIVE
Glucose, UA: NEGATIVE mg/dL
Ketones, ur: NEGATIVE mg/dL
Leukocytes, UA: NEGATIVE
Nitrite: NEGATIVE
Protein, ur: NEGATIVE mg/dL
Specific Gravity, Urine: 1.007 (ref 1.005–1.030)
Urobilinogen, UA: 0.2 mg/dL (ref 0.0–1.0)
pH: 6 (ref 5.0–8.0)

## 2010-06-11 LAB — POCT CARDIAC MARKERS
CKMB, poc: <1 ng/mL — ABNORMAL LOW (ref 1.0–8.0)
Myoglobin, poc: 77.2 ng/mL (ref 12–200)
Troponin i, poc: <0.05 ng/mL (ref 0.00–0.09)

## 2010-06-11 LAB — DIFFERENTIAL
Basophils Absolute: 0 10*3/uL (ref 0.0–0.1)
Basophils Relative: 0 % (ref 0–1)
Eosinophils Absolute: 0.1 10*3/uL (ref 0.0–0.7)
Eosinophils Relative: 3 % (ref 0–5)
Lymphocytes Relative: 32 % (ref 12–46)
Lymphs Abs: 1 10*3/uL (ref 0.7–4.0)
Monocytes Absolute: 0.2 10*3/uL (ref 0.1–1.0)
Monocytes Relative: 6 % (ref 3–12)
Neutro Abs: 1.9 10*3/uL (ref 1.7–7.7)
Neutrophils Relative %: 58 % (ref 43–77)

## 2010-06-11 LAB — URINE MICROSCOPIC-ADD ON

## 2010-06-11 LAB — PROTIME-INR
INR: 1.95 — ABNORMAL HIGH (ref 0.00–1.49)
Prothrombin Time: 22.4 seconds — ABNORMAL HIGH (ref 11.6–15.2)

## 2010-06-12 LAB — CBC
HCT: 32.5 % — ABNORMAL LOW (ref 36.0–46.0)
Hemoglobin: 10.5 g/dL — ABNORMAL LOW (ref 12.0–15.0)
MCH: 31.3 pg (ref 26.0–34.0)
MCHC: 32.3 g/dL (ref 30.0–36.0)
MCV: 97 fL (ref 78.0–100.0)
Platelets: 197 10*3/uL (ref 150–400)
RBC: 3.35 MIL/uL — ABNORMAL LOW (ref 3.87–5.11)
RDW: 15.7 % — ABNORMAL HIGH (ref 11.5–15.5)
WBC: 2.4 10*3/uL — ABNORMAL LOW (ref 4.0–10.5)

## 2010-06-12 LAB — DIFFERENTIAL
Basophils Absolute: 0 10*3/uL (ref 0.0–0.1)
Basophils Relative: 0 % (ref 0–1)
Eosinophils Absolute: 0.1 10*3/uL (ref 0.0–0.7)
Eosinophils Relative: 5 % (ref 0–5)
Lymphocytes Relative: 46 % (ref 12–46)
Lymphs Abs: 1.1 10*3/uL (ref 0.7–4.0)
Monocytes Absolute: 0.3 10*3/uL (ref 0.1–1.0)
Monocytes Relative: 11 % (ref 3–12)
Neutro Abs: 0.9 10*3/uL — ABNORMAL LOW (ref 1.7–7.7)
Neutrophils Relative %: 38 % — ABNORMAL LOW (ref 43–77)

## 2010-06-12 LAB — TISSUE HYBRIDIZATION (BONE MARROW)-NCBH

## 2010-06-12 LAB — BONE MARROW EXAM

## 2010-06-12 LAB — CHROMOSOME ANALYSIS, BONE MARROW

## 2010-06-19 ENCOUNTER — Encounter (HOSPITAL_BASED_OUTPATIENT_CLINIC_OR_DEPARTMENT_OTHER): Payer: MEDICARE | Admitting: Oncology

## 2010-06-19 ENCOUNTER — Other Ambulatory Visit (HOSPITAL_COMMUNITY): Payer: Self-pay | Admitting: Oncology

## 2010-06-19 DIAGNOSIS — Z7901 Long term (current) use of anticoagulants: Secondary | ICD-10-CM

## 2010-06-19 DIAGNOSIS — C9000 Multiple myeloma not having achieved remission: Secondary | ICD-10-CM

## 2010-06-19 LAB — CBC WITH DIFFERENTIAL/PLATELET
BASO%: 0.4 % (ref 0.0–2.0)
Basophils Absolute: 0 10*3/uL (ref 0.0–0.1)
EOS%: 2.6 % (ref 0.0–7.0)
Eosinophils Absolute: 0.2 10*3/uL (ref 0.0–0.5)
HCT: 32.3 % — ABNORMAL LOW (ref 34.8–46.6)
HGB: 10.6 g/dL — ABNORMAL LOW (ref 11.6–15.9)
LYMPH%: 35.8 % (ref 14.0–49.7)
MCH: 32.2 pg (ref 25.1–34.0)
MCHC: 32.7 g/dL (ref 31.5–36.0)
MCV: 98.3 fL (ref 79.5–101.0)
MONO#: 0.7 10*3/uL (ref 0.1–0.9)
MONO%: 10 % (ref 0.0–14.0)
NEUT#: 3.3 10*3/uL (ref 1.5–6.5)
NEUT%: 51.2 % (ref 38.4–76.8)
Platelets: 210 10*3/uL (ref 145–400)
RBC: 3.29 10*6/uL — ABNORMAL LOW (ref 3.70–5.45)
RDW: 18.9 % — ABNORMAL HIGH (ref 11.2–14.5)
WBC: 6.5 10*3/uL (ref 3.9–10.3)
lymph#: 2.3 10*3/uL (ref 0.9–3.3)

## 2010-06-19 LAB — DIFFERENTIAL
Basophils Absolute: 0 10*3/uL (ref 0.0–0.1)
Basophils Relative: 1 % (ref 0–1)
Eosinophils Absolute: 0 10*3/uL (ref 0.0–0.7)
Eosinophils Relative: 0 % (ref 0–5)
Lymphocytes Relative: 35 % (ref 12–46)
Lymphs Abs: 1.3 10*3/uL (ref 0.7–4.0)
Monocytes Absolute: 0.4 10*3/uL (ref 0.1–1.0)
Monocytes Relative: 10 % (ref 3–12)
Neutro Abs: 2 10*3/uL (ref 1.7–7.7)
Neutrophils Relative %: 54 % (ref 43–77)

## 2010-06-19 LAB — CBC
HCT: 29.2 % — ABNORMAL LOW (ref 36.0–46.0)
Hemoglobin: 9.5 g/dL — ABNORMAL LOW (ref 12.0–15.0)
MCHC: 32.7 g/dL (ref 30.0–36.0)
MCV: 98.6 fL (ref 78.0–100.0)
Platelets: 161 10*3/uL (ref 150–400)
RBC: 2.96 MIL/uL — ABNORMAL LOW (ref 3.87–5.11)
RDW: 17.7 % — ABNORMAL HIGH (ref 11.5–15.5)
WBC: 3.7 10*3/uL — ABNORMAL LOW (ref 4.0–10.5)

## 2010-06-19 LAB — TISSUE HYBRIDIZATION (BONE MARROW)-NCBH

## 2010-06-19 LAB — CHROMOSOME ANALYSIS, BONE MARROW

## 2010-06-19 LAB — BONE MARROW EXAM

## 2010-06-20 LAB — COMPREHENSIVE METABOLIC PANEL
ALT: 20 U/L (ref 0–35)
AST: 24 U/L (ref 0–37)
Albumin: 4.1 g/dL (ref 3.5–5.2)
Alkaline Phosphatase: 74 U/L (ref 39–117)
BUN: 15 mg/dL (ref 6–23)
CO2: 22 mEq/L (ref 19–32)
Calcium: 9.7 mg/dL (ref 8.4–10.5)
Chloride: 109 mEq/L (ref 96–112)
Creatinine, Ser: 0.91 mg/dL (ref 0.40–1.20)
Glucose, Bld: 115 mg/dL — ABNORMAL HIGH (ref 70–99)
Potassium: 4.3 mEq/L (ref 3.5–5.3)
Sodium: 141 mEq/L (ref 135–145)
Total Bilirubin: 0.4 mg/dL (ref 0.3–1.2)
Total Protein: 5.9 g/dL — ABNORMAL LOW (ref 6.0–8.3)

## 2010-06-20 LAB — LACTATE DEHYDROGENASE: LDH: 211 U/L (ref 94–250)

## 2010-06-20 LAB — MAGNESIUM: Magnesium: 2.1 mg/dL (ref 1.5–2.5)

## 2010-06-20 LAB — KAPPA/LAMBDA LIGHT CHAINS
Kappa free light chain: 5.77 mg/dL — ABNORMAL HIGH (ref 0.33–1.94)
Kappa:Lambda Ratio: 9.78 — ABNORMAL HIGH (ref 0.26–1.65)
Lambda Free Lght Chn: 0.59 mg/dL (ref 0.57–2.63)

## 2010-06-20 LAB — URIC ACID: Uric Acid, Serum: 3.5 mg/dL (ref 2.4–7.0)

## 2010-07-02 LAB — CBC
HCT: 27.3 % — ABNORMAL LOW (ref 36.0–46.0)
Hemoglobin: 9 g/dL — ABNORMAL LOW (ref 12.0–15.0)
MCHC: 32.9 g/dL (ref 30.0–36.0)
MCV: 95.5 fL (ref 78.0–100.0)
Platelets: 174 10*3/uL (ref 150–400)
RBC: 2.86 MIL/uL — ABNORMAL LOW (ref 3.87–5.11)
RDW: 14.2 % (ref 11.5–15.5)
WBC: 4.6 10*3/uL (ref 4.0–10.5)

## 2010-07-02 LAB — DIFFERENTIAL
Basophils Absolute: 0 10*3/uL (ref 0.0–0.1)
Basophils Relative: 0 % (ref 0–1)
Eosinophils Absolute: 0 10*3/uL (ref 0.0–0.7)
Eosinophils Relative: 1 % (ref 0–5)
Lymphocytes Relative: 48 % — ABNORMAL HIGH (ref 12–46)
Lymphs Abs: 2.2 10*3/uL (ref 0.7–4.0)
Monocytes Absolute: 0.3 10*3/uL (ref 0.1–1.0)
Monocytes Relative: 7 % (ref 3–12)
Neutro Abs: 2 10*3/uL (ref 1.7–7.7)
Neutrophils Relative %: 44 % (ref 43–77)

## 2010-07-02 LAB — BONE MARROW EXAM

## 2010-07-02 LAB — CHROMOSOME ANALYSIS, BONE MARROW

## 2010-07-02 LAB — TISSUE HYBRIDIZATION (BONE MARROW)-NCBH

## 2010-07-10 ENCOUNTER — Other Ambulatory Visit (HOSPITAL_COMMUNITY): Payer: Self-pay | Admitting: Oncology

## 2010-07-12 LAB — COMPREHENSIVE METABOLIC PANEL
ALT: 17 U/L (ref 0–35)
AST: 16 U/L (ref 0–37)
Albumin: 3.6 g/dL (ref 3.5–5.2)
Alkaline Phosphatase: 68 U/L (ref 39–117)
BUN: 11 mg/dL (ref 6–23)
CO2: 29 mEq/L (ref 19–32)
Calcium: 9.5 mg/dL (ref 8.4–10.5)
Chloride: 109 mEq/L (ref 96–112)
Creatinine, Ser: 0.85 mg/dL (ref 0.4–1.2)
GFR calc Af Amer: >60 mL/min (ref >60)
GFR calc non Af Amer: >60 mL/min (ref >60)
Glucose, Bld: 116 mg/dL — ABNORMAL HIGH (ref 70–99)
Potassium: 4 mEq/L (ref 3.5–5.1)
Sodium: 142 mEq/L (ref 135–145)
Total Bilirubin: 0.6 mg/dL (ref 0.3–1.2)
Total Protein: 6 g/dL (ref 6.0–8.3)

## 2010-07-12 LAB — CBC
HCT: 35 % — ABNORMAL LOW (ref 36.0–46.0)
Hemoglobin: 12 g/dL (ref 12.0–15.0)
MCHC: 34.3 g/dL (ref 30.0–36.0)
MCV: 94.9 fL (ref 78.0–100.0)
Platelets: 208 10*3/uL (ref 150–400)
RBC: 3.68 MIL/uL — ABNORMAL LOW (ref 3.87–5.11)
RDW: 13.5 % (ref 11.5–15.5)
WBC: 6.6 10*3/uL (ref 4.0–10.5)

## 2010-07-12 LAB — HEPATIC FUNCTION PANEL
ALT: 22 U/L (ref 0–35)
AST: 20 U/L (ref 0–37)
Albumin: 4.3 g/dL (ref 3.5–5.2)
Alkaline Phosphatase: 77 U/L (ref 39–117)
Bilirubin, Direct: 0.1 mg/dL (ref 0.0–0.3)
Indirect Bilirubin: 0.5 mg/dL (ref 0.3–0.9)
Total Bilirubin: 0.6 mg/dL (ref 0.3–1.2)
Total Protein: 7 g/dL (ref 6.0–8.3)

## 2010-07-12 LAB — TSH: TSH: 0.547 u[IU]/mL (ref 0.350–4.500)

## 2010-07-12 LAB — URINE MICROSCOPIC-ADD ON

## 2010-07-12 LAB — CK TOTAL AND CKMB (NOT AT ARMC)
CK, MB: 1 ng/mL (ref 0.3–4.0)
CK, MB: 1 ng/mL (ref 0.3–4.0)
CK, MB: 1.2 ng/mL (ref 0.3–4.0)
Relative Index: 0.8 (ref 0.0–2.5)
Relative Index: 0.8 (ref 0.0–2.5)
Relative Index: 0.9 (ref 0.0–2.5)
Total CK: 116 U/L (ref 7–177)
Total CK: 128 U/L (ref 7–177)
Total CK: 158 U/L (ref 7–177)

## 2010-07-12 LAB — LIPID PANEL
Cholesterol: 155 mg/dL (ref 0–200)
HDL: 38 mg/dL — ABNORMAL LOW (ref >39)
LDL Cholesterol: 92 mg/dL (ref 0–99)
Total CHOL/HDL Ratio: 4.1 RATIO
Triglycerides: 125 mg/dL (ref <150)
VLDL: 25 mg/dL (ref 0–40)

## 2010-07-12 LAB — UIFE/LIGHT CHAINS/TP QN, 24-HR UR
Albumin, U: DETECTED
Alpha 1, Urine: DETECTED — AB
Alpha 2, Urine: DETECTED — AB
Beta, Urine: DETECTED — AB
Free Kappa Lt Chains,Ur: 2.29 mg/dL — ABNORMAL HIGH (ref 0.04–1.51)
Free Lambda Lt Chains,Ur: <0.04 mg/dL — ABNORMAL LOW (ref 0.08–1.01)
Free Lt Chn Excr Rate: 50.38 mg/d
Gamma Globulin, Urine: DETECTED — AB
Time: 24 hours
Total Protein, Urine-Ur/day: 62 mg/d (ref 10–140)
Total Protein, Urine: 2.8 mg/dL
Volume, Urine: 2200 mL

## 2010-07-12 LAB — POCT CARDIAC MARKERS
CKMB, poc: <1 ng/mL — ABNORMAL LOW (ref 1.0–8.0)
CKMB, poc: <1 ng/mL — ABNORMAL LOW (ref 1.0–8.0)
Myoglobin, poc: 52.1 ng/mL (ref 12–200)
Myoglobin, poc: 76.3 ng/mL (ref 12–200)
Troponin i, poc: <0.05 ng/mL (ref 0.00–0.09)
Troponin i, poc: <0.05 ng/mL (ref 0.00–0.09)

## 2010-07-12 LAB — DIFFERENTIAL
Basophils Absolute: 0 10*3/uL (ref 0.0–0.1)
Basophils Relative: 0 % (ref 0–1)
Eosinophils Absolute: 0 10*3/uL (ref 0.0–0.7)
Eosinophils Relative: 0 % (ref 0–5)
Lymphocytes Relative: 27 % (ref 12–46)
Lymphs Abs: 1.8 10*3/uL (ref 0.7–4.0)
Monocytes Absolute: 0.3 10*3/uL (ref 0.1–1.0)
Monocytes Relative: 5 % (ref 3–12)
Neutro Abs: 4.4 10*3/uL (ref 1.7–7.7)
Neutrophils Relative %: 67 % (ref 43–77)

## 2010-07-12 LAB — POCT I-STAT, CHEM 8
BUN: 18 mg/dL (ref 6–23)
Calcium, Ion: 1.21 mmol/L (ref 1.12–1.32)
Chloride: 107 mEq/L (ref 96–112)
Creatinine, Ser: 0.8 mg/dL (ref 0.4–1.2)
Glucose, Bld: 117 mg/dL — ABNORMAL HIGH (ref 70–99)
HCT: 38 % (ref 36.0–46.0)
Hemoglobin: 12.9 g/dL (ref 12.0–15.0)
Potassium: 4 mEq/L (ref 3.5–5.1)
Sodium: 141 mEq/L (ref 135–145)
TCO2: 28 mmol/L (ref 0–100)

## 2010-07-12 LAB — CARDIAC PANEL(CRET KIN+CKTOT+MB+TROPI)
CK, MB: 0.9 ng/mL (ref 0.3–4.0)
Relative Index: 0.7 (ref 0.0–2.5)
Total CK: 126 U/L (ref 7–177)
Troponin I: <0.01 ng/mL (ref 0.00–0.06)

## 2010-07-12 LAB — PROTIME-INR
INR: 1 (ref 0.00–1.49)
Prothrombin Time: 13.5 seconds (ref 11.6–15.2)

## 2010-07-12 LAB — URINALYSIS, ROUTINE W REFLEX MICROSCOPIC
Bilirubin Urine: NEGATIVE
Glucose, UA: NEGATIVE mg/dL
Hgb urine dipstick: NEGATIVE
Ketones, ur: NEGATIVE mg/dL
Leukocytes, UA: NEGATIVE
Nitrite: NEGATIVE
Protein, ur: 100 mg/dL — AB
Specific Gravity, Urine: 1.029 (ref 1.005–1.030)
Urobilinogen, UA: 0.2 mg/dL (ref 0.0–1.0)
pH: 7 (ref 5.0–8.0)

## 2010-07-12 LAB — URINE CULTURE: Colony Count: 25000

## 2010-07-12 LAB — TROPONIN I
Troponin I: <0.01 ng/mL (ref 0.00–0.06)
Troponin I: <0.01 ng/mL (ref 0.00–0.06)
Troponin I: <0.01 ng/mL (ref 0.00–0.06)

## 2010-07-12 LAB — APTT: aPTT: 26 seconds (ref 24–37)

## 2010-07-12 LAB — D-DIMER, QUANTITATIVE: D-Dimer, Quant: 0.55 ug/mL-FEU — ABNORMAL HIGH (ref 0.00–0.48)

## 2010-07-12 LAB — GLUCOSE, CAPILLARY: Glucose-Capillary: 130 mg/dL — ABNORMAL HIGH (ref 70–99)

## 2010-07-12 LAB — HEMOGLOBIN A1C
Hgb A1c MFr Bld: 6.4 % — ABNORMAL HIGH (ref 4.6–6.1)
Mean Plasma Glucose: 137 mg/dL

## 2010-07-12 LAB — BRAIN NATRIURETIC PEPTIDE: Pro B Natriuretic peptide (BNP): <30 pg/mL (ref 0.0–100.0)

## 2010-07-19 ENCOUNTER — Encounter (HOSPITAL_BASED_OUTPATIENT_CLINIC_OR_DEPARTMENT_OTHER): Payer: MEDICARE | Admitting: Oncology

## 2010-07-19 ENCOUNTER — Other Ambulatory Visit (HOSPITAL_COMMUNITY): Payer: Self-pay | Admitting: Oncology

## 2010-07-19 DIAGNOSIS — C9000 Multiple myeloma not having achieved remission: Secondary | ICD-10-CM

## 2010-07-19 LAB — CBC WITH DIFFERENTIAL/PLATELET
BASO%: 0.4 % (ref 0.0–2.0)
Basophils Absolute: 0 10*3/uL (ref 0.0–0.1)
EOS%: 2.1 % (ref 0.0–7.0)
Eosinophils Absolute: 0.1 10*3/uL (ref 0.0–0.5)
HCT: 33.2 % — ABNORMAL LOW (ref 34.8–46.6)
HGB: 10.9 g/dL — ABNORMAL LOW (ref 11.6–15.9)
LYMPH%: 26.6 % (ref 14.0–49.7)
MCH: 33.3 pg (ref 25.1–34.0)
MCHC: 33 g/dL (ref 31.5–36.0)
MCV: 101.1 fL — ABNORMAL HIGH (ref 79.5–101.0)
MONO#: 0.7 10*3/uL (ref 0.1–0.9)
MONO%: 11.1 % (ref 0.0–14.0)
NEUT#: 4 10*3/uL (ref 1.5–6.5)
NEUT%: 59.8 % (ref 38.4–76.8)
Platelets: 212 10*3/uL (ref 145–400)
RBC: 3.29 10*6/uL — ABNORMAL LOW (ref 3.70–5.45)
RDW: 15.9 % — ABNORMAL HIGH (ref 11.2–14.5)
WBC: 6.8 10*3/uL (ref 3.9–10.3)
lymph#: 1.8 10*3/uL (ref 0.9–3.3)

## 2010-07-19 LAB — PROTEIN / CREATININE RATIO, URINE
Creatinine, Urine: 93.7 mg/dL
Protein Creatinine Ratio: 0.09 (ref <0.15)
Total Protein, Urine: 8 mg/dL

## 2010-07-20 LAB — MAGNESIUM: Magnesium: 2 mg/dL (ref 1.5–2.5)

## 2010-07-20 LAB — COMPREHENSIVE METABOLIC PANEL
ALT: 12 U/L (ref 0–35)
AST: 17 U/L (ref 0–37)
Albumin: 4.5 g/dL (ref 3.5–5.2)
Alkaline Phosphatase: 91 U/L (ref 39–117)
BUN: 14 mg/dL (ref 6–23)
CO2: 24 mEq/L (ref 19–32)
Calcium: 9.9 mg/dL (ref 8.4–10.5)
Chloride: 105 mEq/L (ref 96–112)
Creatinine, Ser: 0.86 mg/dL (ref 0.40–1.20)
Glucose, Bld: 115 mg/dL — ABNORMAL HIGH (ref 70–99)
Potassium: 3.9 mEq/L (ref 3.5–5.3)
Sodium: 140 mEq/L (ref 135–145)
Total Bilirubin: 0.4 mg/dL (ref 0.3–1.2)
Total Protein: 6.6 g/dL (ref 6.0–8.3)

## 2010-07-20 LAB — URIC ACID: Uric Acid, Serum: 3.6 mg/dL (ref 2.4–7.0)

## 2010-07-20 LAB — KAPPA/LAMBDA LIGHT CHAINS
Kappa free light chain: 4.93 mg/dL — ABNORMAL HIGH (ref 0.33–1.94)
Kappa:Lambda Ratio: 10.49 — ABNORMAL HIGH (ref 0.26–1.65)
Lambda Free Lght Chn: 0.47 mg/dL — ABNORMAL LOW (ref 0.57–2.63)

## 2010-07-20 LAB — LACTATE DEHYDROGENASE: LDH: 187 U/L (ref 94–250)

## 2010-08-14 NOTE — H&P (Signed)
NAME:  Carmen Fisher, Carmen Fisher NO.:  [id number]   MEDICAL RECORD NO.:  [id number]          PATIENT TYPE:  INP   LOCATION:  1827                         FACILITY:  [facility]   PHYSICIAN:  Elliot Cousin, [name]    DATE OF BIRTH:  12-04-1940   DATE OF ADMISSION:  06/04/2008  DATE OF DISCHARGE:                              HISTORY & PHYSICAL   PRIMARY CARE PHYSICIAN:  [name], [name]   CHIEF COMPLAINT:  Chest pain.   HISTORY OF PRESENT ILLNESS:  The patient is a 70 year old woman with a  past medical history significant for dyslipidemia and hypertension, who  presents to the emergency department today with a chief complaint of  chest pain.  The chest pain actually started approximately 2 days ago.  Over the past 48 hours it has become progressively worse.  It is located  primarily in the substernal area with a tendency to radiate a little to  the left.  There is no other radiation.  The pain is described as a  throbbing pain.  At its worse it has been an 8/10 in intensity.  Initially the pain was intermittent.  However, over the past 24 hours it  has been constant.  At times, when she breathes in deeply the pain  intensifies.  The pain also can occur at rest and with activity.  She  did have some associated dizziness, frontal headache, shortness of  breath, diaphoresis and nausea.  She denies unusual swelling and pain in  her legs.  She denies any recent heartburn.  She does acknowledge that a  small child ran into her chest a few days ago.  She denies any recent  heavy lifting or musculoskeletal strain.  She had chest pain similar to  the pain today approximately 10 years ago.  Apparently she underwent a  stress test that was negative.   During the evaluation in the emergency department, the patient is noted  to be mildly hypertensive.  Otherwise hemodynamically stable.  Her EKG  reveals a heart rate of 81 beats per minute, artifact and nonspecific T-  wave  abnormalities..  Her electrolytes are virtually unremarkable.  Her  CBC is virtually unremarkable.  Her initial cardiac markers are  negative.  Her D-dimer however was elevated at 0.55.  The emergency  department physician ordered a CT angiogram of the chest and it was  negative for PE.  Given her risk factors, she will be admitted for  further evaluation and management.   PAST MEDICAL HISTORY:  1. Dyslipidemia.  2. Hypertension (diet-controlled).  3. Lipoma of the left upper back.  4. Status post hysterectomy secondary to fibroids.   MEDICATIONS:  1. Lipitor 40 mg daily.  2. Multivitamin once daily.   ALLERGIES:  The patient has an allergy to ASPIRIN which causes facial  swelling.   SOCIAL HISTORY:  The patient is widowed.  [place]  She has two children.  She is retired from [employer].  She  denies tobacco and illicit drug use.  She does drink alcohol  occasionally.  FAMILY HISTORY:  Her mother is 68 years of age and has a history of a  heart murmur but no history of myocardial infarction or heart failure.  She also has a history of two mini strokes.  The patient's father died  of throat cancer at 68 years of age.   REVIEW OF SYSTEMS:  Positive for occasional numbness in the right hand.  Her review of systems is negative for blackouts, visual changes,  difficulty swallowing, abdominal pain, vomiting, black tarry stools,  bright red blood per rectum, pain with urination and unusual swelling in  her legs.   PHYSICAL EXAMINATION:  VITAL SIGNS:  Temperature 98.1, blood pressure  143/89, pulse 72, respiratory rate 18, oxygen saturation 98% on room  air.  GENERAL:  The patient is a pleasant 70 year old African American woman  who is currently sitting up in bed in no acute distress.  HEENT:  Head is normocephalic and nontraumatic.  Pupils equal, round,  reactive to light.  Extraocular muscles are intact.  Conjunctivae are  clear.  Sclerae are  white.  Nasal mucosa is mildly dry.  No sinus  tenderness.  Oropharynx reveals moist mucous membranes.  There is a full  set of dentures.  No posterior exudates or erythema.  NECK:  Neck is supple.  No adenopathy, no thyromegaly, no bruit or JVD.  LUNGS:  Clear to auscultation bilaterally.  HEART:  S1-S2 with no murmurs, rubs or gallops.  ABDOMEN:  Positive bowel sounds, soft, nontender, nondistended.  No  hepatosplenomegaly, no masses palpated.  GU:  Deferred.  RECTAL:  Deferred.  EXTREMITIES:  Pedal pulses are palpable bilaterally.  No pretibial  edema.  No pedal edema.  NEUROLOGIC:  The patient is alert and oriented x3.  Cranial nerves II-  XII are intact.  Strength is 5/5 throughout.  Sensation is intact.   Chest x-ray reveals no active cardiopulmonary disease.  CT angiogram of  the chest reveals negative for acute PE, scattered aortic and  brachiocephalic arterial calcifications and bibasilar dependent  atelectasis.  EKG reveals nonspecific T-wave abnormalities with a heart  rate of 81 beats per minute and artifact.  Urinalysis essentially  negative.  CK-MB less than 1, troponin I less than 0.05, myoglobin 52.1,  D-dimer 0.55, PTT 26, PT 13.5.  Ionized calcium 1.21, CO2 28, hemoglobin  12.9, sodium 141, potassium 4.0, chloride 107, glucose 117, BUN 18,  creatinine 0.8, WBC 6.6, hemoglobin 12.0, hematocrit 35.0, platelets  208.   ASSESSMENT:  1. Chest pain.  The patient's pain is somewhat concerning for angina,      although at other times it appears to be atypical.  She does have      risk factors for coronary artery disease including age,      hypertension and dyslipidemia.  Her chest pain was mildly relieved      with one sublingual nitroglycerin.  Her EKG reveals quite a bit of      artifact although there may be some nonspecific T-wave      abnormalities.  2. Dyslipidemia.  3. Hypertension.  4. Aspirin allergy.   PLAN:  1. The patient was started on nitroglycerin  paste.  This will      continue.  2. Will add metoprolol at 12.5 mg b.i.d. for beta blockade therapy and      achievement of control of mild hypertension.  3. The patient received 300 mg of Plavix by the emergency department      physician.  Will continue Plavix at  75 mg daily.  Will add full-      dose Lovenox empirically.  4. Will treat the patient's pain in addition with as needed morphine.      She will be placed on oxygen to keep her oxygen saturations greater      than 90%.  Supportive treatment with antiemetics will be initiated      and proton pump      inhibitor therapy will be started.  5. Will consult cardiology (Dr. Elsie Lincoln from [facility] and      Vascular has been consulted already).  6. For further evaluation will check cardiac enzymes, LFTs, TSH,      fasting lipid panel and BNP.      Elliot Cousin, [name]  Electronically Signed     DF/MEDQ  D:  06/04/2008  T:  06/04/2008  Job:  [id number]   cc:   [name], [name]

## 2010-08-17 NOTE — Op Note (Signed)
[place]. [facility]  Patient:    Carmen Fisher, Carmen Fisher Visit Number: [id number] MRN: [id number]          Service Type: DSU Location: [facility] Attending Physician:  Kendell Bane Dictated by:   Lowell Bouton, [name] Proc. Date: 10/14/01 Admit Date:  10/14/2001                             Operative Report  PREOPERATIVE DIAGNOSIS:  Left carpal tunnel syndrome and left ring trigger finger.  POSTOPERATIVE DIAGNOSIS:  Left carpal tunnel syndrome and left ring trigger finger.  OPERATION PERFORMED:  Decompression median nerve, left carpal tunnel with release of A-1 pulley, left ring finger.  SURGEON:  Lowell Bouton, [name]  ANESTHESIA:  0.5% Marcaine local with sedation.  OPERATIVE FINDINGS:  The patient had a fairly narrow carpal canal with no masses present.  The motor branch of the nerve was intact.  DESCRIPTION OF PROCEDURE:  Under 0.5% Marcaine local anesthesia with a tourniquet on the left arm, the left hand was prepped and draped in the usual fashion and after exsanguinating the limb, the tourniquet was inflated to 250 mmHg.  A 3 cm longitudinal incision was made in the palm just ulnar to the thenar crease.  Sharp dissection was carried through the subcutaneous tissues and bleeding points were coagulated.  Blunt dissection was carried down through the superficial palmar fascia distal to the transverse carpal ligament.  A hemostat was placed in the carpal canal up against the hook of the hamate and the transverse carpal ligament was divided on the ulnar border of the median nerve.  The proximal end of the ligament was divided with the scissors after dissecting the nerve away from the undersurface of the ligament.  The carpal canal was then palpated and was found to be adequately decompressed.  The nerve was examined and motor branch identified.  The wound was irrigated with saline and closed with 4-0 nylon sutures.  A  transverse incision was then made separately over the A-1 pulley of the left ring finger. Blunt dissection was carried through the subcutaneous tissues down to the flexor tendon sheath.  The A-1 pulley was incised with a knife followed by scissors to completely release the pulley.  The finger was then passively flexed and no further triggering was identified.  The wound was irrigated with saline and the skin was closed with 4-0 nylon sutures.  Sterile dressing was applied followed by volar wrist splint.  The patient tolerated the procedure well and went to the recovery room awake and stable in good condition. Dictated by:   Lowell Bouton, [name] Attending Physician:  Kendell Bane DD:  10/14/01 TD:  10/15/01 Job: [id number]

## 2010-08-17 NOTE — Procedures (Signed)
[place]. [facility]  Patient:    Carmen Fisher, Carmen Fisher Visit Number: [id number] MRN: [id number]          Service Type: END Location: ENDO Attending Physician:  Sabino Gasser Dictated by:   Sabino Gasser, [name] Admit Date:  09/22/2001                             Procedure Report  PROCEDURE:  Colonoscopy.  INDICATION:  Rectal bleeding.  ANESTHESIA:  Demerol none further, Versed 5 mg.  DESCRIPTION OF PROCEDURE:  With the patient mildly sedated in the left lateral decubitus position, the Olympus videoscopic colonoscope was inserted in the rectum and passed under direct vision to the cecum, identified by the ileocecal valve and appendiceal orifice.  We entered into the terminal ileum, which also appeared normal and was photographed.  From this point the colonoscope was slowly withdrawn, taking circumferential views of the entire colonic mucosa, stopping in the proximal rectum, where a small polyp was seen, photographed, and removed using snare cautery technique, setting of 20-20 blended current.  The endoscope was then withdrawn to the rectum, which appeared normal on direct and showed hemorrhoids on retroflex view.  The endoscope was straightened and withdrawn.  The patients vital signs and pulse oximetry remained stable.  The patient tolerated the procedure well with no apparent complications.  FINDINGS:  Internal hemorrhoids, small polyp of rectosigmoid area.  PLAN:  Await biopsy report.  The patient will call me for results and follow up with me as an outpatient. Dictated by:   Sabino Gasser, [name] Attending Physician:  Sabino Gasser DD:  09/22/01 TD:  09/23/01 Job: [id number]

## 2010-08-17 NOTE — Discharge Summary (Signed)
NAME:  Carmen Fisher, Carmen Fisher                ACCOUNT NO.:  [id number]   MEDICAL RECORD NO.:  [id number]          PATIENT TYPE:  INP   LOCATION:  3730                         FACILITY:  [facility]   PHYSICIAN:  Theodosia Paling, MD    DATE OF BIRTH:  December 19, 1940   DATE OF ADMISSION:  06/04/2008  DATE OF DISCHARGE:  06/05/2008                               DISCHARGE SUMMARY   PRIMARY CARE PHYSICIAN:  [name], MD   DISCHARGE DIAGNOSES:  1. Chest pain with negative stress test, likely noncardiac in origin      and negative CTA for pulmonary embolism also.  2. History of hypertension.  3. History of dyslipidemia.  4. History of lipoma of the left upper back.   DISCHARGE MEDICATIONS:  1. The patient is to continue home medication Lipitor of 40 mg daily.  2. Multivitamin 1 tablet p.o. daily.   NEW MEDICATIONS ADDED:  1. Aspirin enteric-coated 81 mg daily.  2. Prilosec 20 mg daily q. 12 h.  3. Maalox 30 mL p.o. q.6 h. p.r.n.   HOSPITAL COURSE:  Following issues were addressed during the  hospitalization.  1. Chest pain.  The patient was admitted.  Cardiac enzymes were      performed every 8 hours.  The patient underwent a cardiac stress      test, which was essentially negative.  She also underwent a CTA,      pulmonary embolism was ruled out.  Cardiology consultation was      performed.  2. The patient's pain resolved on its own especially with Maalox.  The      patient is going home with aspirin, Prilosec, and Maalox.  3. Dyslipidemia.  Statins were continued.  4. Hypertension.  The patient's blood pressure actually stayed within      normal limit without any antihypertensives.   PROCEDURES PERFORMED:  1. Myocardial perfusion test, negative for pharmacological stress-      induced ischemia, left ventricular ejection fraction around 71%.  2. CTA of the chest performed on June 04, 2008, was negative for acute      pulmonary embolism.  3. Chest x-ray performed on June 04, 2008, was  negative for any acute      cardiopulmonary event.   DISPOSITION:  The patient will follow up with Dr. [name] within 1 week's  time.   TOTAL TIME SPENT IN DISCHARGE OF THIS PATIENT:  45 minutes.      Theodosia Paling, MD  Electronically Signed     NP/MEDQ  D:  08/11/2008  T:  08/12/2008  Job:  [id number]   cc:   [name], [name]

## 2010-09-03 ENCOUNTER — Other Ambulatory Visit (HOSPITAL_COMMUNITY): Payer: Self-pay | Admitting: Oncology

## 2010-09-03 ENCOUNTER — Encounter (HOSPITAL_BASED_OUTPATIENT_CLINIC_OR_DEPARTMENT_OTHER): Payer: Medicare Other | Admitting: Oncology

## 2010-09-03 DIAGNOSIS — C9000 Multiple myeloma not having achieved remission: Secondary | ICD-10-CM

## 2010-09-03 LAB — CBC WITH DIFFERENTIAL/PLATELET
BASO%: 0.3 % (ref 0.0–2.0)
Basophils Absolute: 0 10*3/uL (ref 0.0–0.1)
EOS%: 1.6 % (ref 0.0–7.0)
Eosinophils Absolute: 0.1 10*3/uL (ref 0.0–0.5)
HCT: 34.5 % — ABNORMAL LOW (ref 34.8–46.6)
HGB: 11.3 g/dL — ABNORMAL LOW (ref 11.6–15.9)
LYMPH%: 34.4 % (ref 14.0–49.7)
MCH: 32.7 pg (ref 25.1–34.0)
MCHC: 32.7 g/dL (ref 31.5–36.0)
MCV: 100.1 fL (ref 79.5–101.0)
MONO#: 0.3 10*3/uL (ref 0.1–0.9)
MONO%: 7.4 % (ref 0.0–14.0)
NEUT#: 2.6 10*3/uL (ref 1.5–6.5)
NEUT%: 56.3 % (ref 38.4–76.8)
Platelets: 199 10*3/uL (ref 145–400)
RBC: 3.45 10*6/uL — ABNORMAL LOW (ref 3.70–5.45)
RDW: 13.7 % (ref 11.2–14.5)
WBC: 4.6 10*3/uL (ref 3.9–10.3)
lymph#: 1.6 10*3/uL (ref 0.9–3.3)

## 2010-09-03 LAB — PROTEIN / CREATININE RATIO, URINE
Creatinine, Urine: 147.5 mg/dL
Protein Creatinine Ratio: 0.04 (ref <0.15)
Total Protein, Urine: 6 mg/dL

## 2010-09-04 LAB — COMPREHENSIVE METABOLIC PANEL
ALT: 18 U/L (ref 0–35)
AST: 18 U/L (ref 0–37)
Albumin: 4.5 g/dL (ref 3.5–5.2)
Alkaline Phosphatase: 86 U/L (ref 39–117)
BUN: 17 mg/dL (ref 6–23)
CO2: 25 mEq/L (ref 19–32)
Calcium: 10 mg/dL (ref 8.4–10.5)
Chloride: 106 mEq/L (ref 96–112)
Creatinine, Ser: 0.87 mg/dL (ref 0.50–1.10)
Glucose, Bld: 128 mg/dL — ABNORMAL HIGH (ref 70–99)
Potassium: 4 mEq/L (ref 3.5–5.3)
Sodium: 141 mEq/L (ref 135–145)
Total Bilirubin: 0.2 mg/dL — ABNORMAL LOW (ref 0.3–1.2)
Total Protein: 6.6 g/dL (ref 6.0–8.3)

## 2010-09-04 LAB — URIC ACID: Uric Acid, Serum: 3.4 mg/dL (ref 2.4–7.0)

## 2010-09-04 LAB — KAPPA/LAMBDA LIGHT CHAINS
Kappa free light chain: 8.55 mg/dL — ABNORMAL HIGH (ref 0.33–1.94)
Kappa:Lambda Ratio: 9.61 — ABNORMAL HIGH (ref 0.26–1.65)
Lambda Free Lght Chn: 0.89 mg/dL (ref 0.57–2.63)

## 2010-09-04 LAB — MAGNESIUM: Magnesium: 2 mg/dL (ref 1.5–2.5)

## 2010-09-04 LAB — LACTATE DEHYDROGENASE: LDH: 164 U/L (ref 94–250)

## 2010-09-12 ENCOUNTER — Encounter (HOSPITAL_BASED_OUTPATIENT_CLINIC_OR_DEPARTMENT_OTHER): Payer: Medicare Other | Admitting: Oncology

## 2010-09-12 ENCOUNTER — Other Ambulatory Visit (HOSPITAL_COMMUNITY): Payer: Self-pay | Admitting: Oncology

## 2010-09-12 DIAGNOSIS — C9000 Multiple myeloma not having achieved remission: Secondary | ICD-10-CM

## 2010-09-12 DIAGNOSIS — Z7901 Long term (current) use of anticoagulants: Secondary | ICD-10-CM

## 2010-09-12 LAB — PROTIME-INR
INR: 0.9 — ABNORMAL LOW (ref 2.00–3.50)
Protime: 10.8 Seconds (ref 10.6–13.4)

## 2010-09-17 ENCOUNTER — Encounter (HOSPITAL_BASED_OUTPATIENT_CLINIC_OR_DEPARTMENT_OTHER): Payer: Medicare Other | Admitting: Oncology

## 2010-09-17 ENCOUNTER — Other Ambulatory Visit (HOSPITAL_COMMUNITY): Payer: Self-pay | Admitting: Oncology

## 2010-09-17 DIAGNOSIS — C9000 Multiple myeloma not having achieved remission: Secondary | ICD-10-CM

## 2010-09-17 DIAGNOSIS — Z7901 Long term (current) use of anticoagulants: Secondary | ICD-10-CM

## 2010-09-17 LAB — PROTIME-INR
INR: 1.2 — ABNORMAL LOW (ref 2.00–3.50)
Protime: 14.4 Seconds — ABNORMAL HIGH (ref 10.6–13.4)

## 2010-09-19 ENCOUNTER — Encounter (HOSPITAL_BASED_OUTPATIENT_CLINIC_OR_DEPARTMENT_OTHER): Payer: Medicare Other | Admitting: Oncology

## 2010-09-19 ENCOUNTER — Other Ambulatory Visit (HOSPITAL_COMMUNITY): Payer: Self-pay | Admitting: Oncology

## 2010-09-19 DIAGNOSIS — Z7901 Long term (current) use of anticoagulants: Secondary | ICD-10-CM

## 2010-09-19 DIAGNOSIS — C9000 Multiple myeloma not having achieved remission: Secondary | ICD-10-CM

## 2010-09-19 LAB — PROTIME-INR
INR: 1.3 — ABNORMAL LOW (ref 2.00–3.50)
Protime: 15.6 Seconds — ABNORMAL HIGH (ref 10.6–13.4)

## 2010-09-24 ENCOUNTER — Encounter (HOSPITAL_BASED_OUTPATIENT_CLINIC_OR_DEPARTMENT_OTHER): Payer: Medicare Other | Admitting: Oncology

## 2010-09-24 ENCOUNTER — Other Ambulatory Visit (HOSPITAL_COMMUNITY): Payer: Self-pay | Admitting: Oncology

## 2010-09-24 DIAGNOSIS — C9000 Multiple myeloma not having achieved remission: Secondary | ICD-10-CM

## 2010-09-24 DIAGNOSIS — Z7901 Long term (current) use of anticoagulants: Secondary | ICD-10-CM

## 2010-09-24 LAB — PROTIME-INR
INR: 1.5 — ABNORMAL LOW (ref 2.00–3.50)
Protime: 18 Seconds — ABNORMAL HIGH (ref 10.6–13.4)

## 2010-09-28 ENCOUNTER — Other Ambulatory Visit (HOSPITAL_COMMUNITY): Payer: Self-pay | Admitting: Oncology

## 2010-09-28 ENCOUNTER — Encounter (HOSPITAL_BASED_OUTPATIENT_CLINIC_OR_DEPARTMENT_OTHER): Payer: Medicare Other | Admitting: Oncology

## 2010-09-28 DIAGNOSIS — C9000 Multiple myeloma not having achieved remission: Secondary | ICD-10-CM

## 2010-09-28 DIAGNOSIS — Z7901 Long term (current) use of anticoagulants: Secondary | ICD-10-CM

## 2010-09-28 LAB — CBC WITH DIFFERENTIAL/PLATELET
BASO%: 0.4 % (ref 0.0–2.0)
Basophils Absolute: 0 10*3/uL (ref 0.0–0.1)
EOS%: 2 % (ref 0.0–7.0)
Eosinophils Absolute: 0.1 10*3/uL (ref 0.0–0.5)
HCT: 36.2 % (ref 34.8–46.6)
HGB: 11.9 g/dL (ref 11.6–15.9)
LYMPH%: 32.5 % (ref 14.0–49.7)
MCH: 31.2 pg (ref 25.1–34.0)
MCHC: 32.9 g/dL (ref 31.5–36.0)
MCV: 95 fL (ref 79.5–101.0)
MONO#: 0.6 10*3/uL (ref 0.1–0.9)
MONO%: 11.8 % (ref 0.0–14.0)
NEUT#: 2.7 10*3/uL (ref 1.5–6.5)
NEUT%: 53.3 % (ref 38.4–76.8)
Platelets: 213 10*3/uL (ref 145–400)
RBC: 3.81 10*6/uL (ref 3.70–5.45)
RDW: 13.4 % (ref 11.2–14.5)
WBC: 5 10*3/uL (ref 3.9–10.3)
lymph#: 1.6 10*3/uL (ref 0.9–3.3)

## 2010-09-28 LAB — PROTIME-INR
INR: 2.1 (ref 2.00–3.50)
Protime: 25.2 Seconds — ABNORMAL HIGH (ref 10.6–13.4)

## 2010-09-29 LAB — PROTEIN / CREATININE RATIO, URINE
Creatinine, Urine: 142.5 mg/dL
Protein Creatinine Ratio: 0.04 (ref <0.15)
Total Protein, Urine: 5 mg/dL

## 2010-10-01 ENCOUNTER — Other Ambulatory Visit (HOSPITAL_COMMUNITY): Payer: Self-pay | Admitting: Oncology

## 2010-10-02 LAB — IMMUNOFIXATION ELECTROPHORESIS
IgA: 86 mg/dL (ref 68–380)
IgG (Immunoglobin G), Serum: 858 mg/dL (ref 690–1700)
IgM, Serum: 30 mg/dL — ABNORMAL LOW (ref 52–322)
Total Protein, Serum Electrophoresis: 7 g/dL (ref 6.0–8.3)

## 2010-10-02 LAB — COMPREHENSIVE METABOLIC PANEL
ALT: 18 U/L (ref 0–35)
AST: 16 U/L (ref 0–37)
Albumin: 4.6 g/dL (ref 3.5–5.2)
Alkaline Phosphatase: 94 U/L (ref 39–117)
BUN: 14 mg/dL (ref 6–23)
CO2: 25 mEq/L (ref 19–32)
Calcium: 10.2 mg/dL (ref 8.4–10.5)
Chloride: 105 mEq/L (ref 96–112)
Creatinine, Ser: 0.93 mg/dL (ref 0.50–1.10)
Glucose, Bld: 95 mg/dL (ref 70–99)
Potassium: 4 mEq/L (ref 3.5–5.3)
Sodium: 140 mEq/L (ref 135–145)
Total Bilirubin: 0.2 mg/dL — ABNORMAL LOW (ref 0.3–1.2)
Total Protein: 7 g/dL (ref 6.0–8.3)

## 2010-10-02 LAB — KAPPA/LAMBDA LIGHT CHAINS
Kappa free light chain: 8.29 mg/dL — ABNORMAL HIGH (ref 0.33–1.94)
Kappa:Lambda Ratio: 7.9 — ABNORMAL HIGH (ref 0.26–1.65)
Lambda Free Lght Chn: 1.05 mg/dL (ref 0.57–2.63)

## 2010-10-02 LAB — LACTATE DEHYDROGENASE: LDH: 181 U/L (ref 94–250)

## 2010-10-04 LAB — UIFE/LIGHT CHAINS/TP QN, 24-HR UR
Albumin, U: DETECTED
Alpha 1, Urine: DETECTED — AB
Alpha 2, Urine: DETECTED — AB
Beta, Urine: DETECTED — AB
Free Kappa Lt Chains,Ur: 4.87 mg/dL — ABNORMAL HIGH (ref 0.14–2.42)
Free Kappa/Lambda Ratio: 27.06 ratio — ABNORMAL HIGH (ref 2.04–10.37)
Free Lambda Excretion/Day: 3.96 mg/d
Free Lambda Lt Chains,Ur: 0.18 mg/dL (ref 0.02–0.67)
Free Lt Chn Excr Rate: 107.14 mg/d
Gamma Globulin, Urine: DETECTED — AB
Time: 24 hours
Total Protein, Urine-Ur/day: 125 mg/d (ref 10–140)
Total Protein, Urine: 5.7 mg/dL
Volume, Urine: 2200 mL

## 2010-10-04 LAB — CREATININE CLEARANCE, URINE, 24 HOUR
Collection Interval-CRCL: 24 hours
Creatinine Clearance: 116 mL/min — ABNORMAL HIGH (ref 75–115)
Creatinine, 24H Ur: 1560 mg/d (ref 700–1800)
Creatinine, Urine: 70.9 mg/dL
Creatinine: 0.93 mg/dL (ref 0.50–1.10)
Urine Total Volume-CRCL: 2200 mL

## 2010-10-05 ENCOUNTER — Other Ambulatory Visit (HOSPITAL_COMMUNITY): Payer: Self-pay | Admitting: Oncology

## 2010-10-05 ENCOUNTER — Encounter (HOSPITAL_BASED_OUTPATIENT_CLINIC_OR_DEPARTMENT_OTHER): Payer: Medicare Other | Admitting: Oncology

## 2010-10-05 DIAGNOSIS — Z7901 Long term (current) use of anticoagulants: Secondary | ICD-10-CM

## 2010-10-05 DIAGNOSIS — C9000 Multiple myeloma not having achieved remission: Secondary | ICD-10-CM

## 2010-10-05 LAB — CBC WITH DIFFERENTIAL/PLATELET
BASO%: 0.3 % (ref 0.0–2.0)
Basophils Absolute: 0 10*3/uL (ref 0.0–0.1)
EOS%: 1.7 % (ref 0.0–7.0)
Eosinophils Absolute: 0.1 10*3/uL (ref 0.0–0.5)
HCT: 36.2 % (ref 34.8–46.6)
HGB: 11.8 g/dL (ref 11.6–15.9)
LYMPH%: 25 % (ref 14.0–49.7)
MCH: 30.9 pg (ref 25.1–34.0)
MCHC: 32.6 g/dL (ref 31.5–36.0)
MCV: 94.8 fL (ref 79.5–101.0)
MONO#: 0.8 10*3/uL (ref 0.1–0.9)
MONO%: 12 % (ref 0.0–14.0)
NEUT#: 3.9 10*3/uL (ref 1.5–6.5)
NEUT%: 61 % (ref 38.4–76.8)
Platelets: 193 10*3/uL (ref 145–400)
RBC: 3.82 10*6/uL (ref 3.70–5.45)
RDW: 13.5 % (ref 11.2–14.5)
WBC: 6.4 10*3/uL (ref 3.9–10.3)
lymph#: 1.6 10*3/uL (ref 0.9–3.3)
nRBC: 0 % (ref 0–0)

## 2010-10-05 LAB — PROTIME-INR
INR: 1.7 — ABNORMAL LOW (ref 2.00–3.50)
Protime: 20.4 Seconds — ABNORMAL HIGH (ref 10.6–13.4)

## 2010-10-12 ENCOUNTER — Encounter (HOSPITAL_BASED_OUTPATIENT_CLINIC_OR_DEPARTMENT_OTHER): Payer: Medicare Other | Admitting: Oncology

## 2010-10-12 ENCOUNTER — Other Ambulatory Visit (HOSPITAL_COMMUNITY): Payer: Self-pay | Admitting: Oncology

## 2010-10-12 DIAGNOSIS — Z7901 Long term (current) use of anticoagulants: Secondary | ICD-10-CM

## 2010-10-12 DIAGNOSIS — C9000 Multiple myeloma not having achieved remission: Secondary | ICD-10-CM

## 2010-10-12 LAB — CBC WITH DIFFERENTIAL/PLATELET
BASO%: 0.7 % (ref 0.0–2.0)
Basophils Absolute: 0 10*3/uL (ref 0.0–0.1)
EOS%: 2.3 % (ref 0.0–7.0)
Eosinophils Absolute: 0.1 10*3/uL (ref 0.0–0.5)
HCT: 33.3 % — ABNORMAL LOW (ref 34.8–46.6)
HGB: 10.9 g/dL — ABNORMAL LOW (ref 11.6–15.9)
LYMPH%: 35.7 % (ref 14.0–49.7)
MCH: 30.9 pg (ref 25.1–34.0)
MCHC: 32.7 g/dL (ref 31.5–36.0)
MCV: 94.3 fL (ref 79.5–101.0)
MONO#: 0.6 10*3/uL (ref 0.1–0.9)
MONO%: 14 % (ref 0.0–14.0)
NEUT#: 2.1 10*3/uL (ref 1.5–6.5)
NEUT%: 47.3 % (ref 38.4–76.8)
Platelets: 222 10*3/uL (ref 145–400)
RBC: 3.53 10*6/uL — ABNORMAL LOW (ref 3.70–5.45)
RDW: 13.7 % (ref 11.2–14.5)
WBC: 4.4 10*3/uL (ref 3.9–10.3)
lymph#: 1.6 10*3/uL (ref 0.9–3.3)

## 2010-10-12 LAB — PROTIME-INR
INR: 1.7 — ABNORMAL LOW (ref 2.00–3.50)
Protime: 20.4 Seconds — ABNORMAL HIGH (ref 10.6–13.4)

## 2010-10-19 ENCOUNTER — Encounter (HOSPITAL_BASED_OUTPATIENT_CLINIC_OR_DEPARTMENT_OTHER): Payer: Medicare Other | Admitting: Oncology

## 2010-10-19 ENCOUNTER — Other Ambulatory Visit (HOSPITAL_COMMUNITY): Payer: Self-pay | Admitting: Oncology

## 2010-10-19 DIAGNOSIS — Z7901 Long term (current) use of anticoagulants: Secondary | ICD-10-CM

## 2010-10-19 DIAGNOSIS — C9000 Multiple myeloma not having achieved remission: Secondary | ICD-10-CM

## 2010-10-19 LAB — CBC WITH DIFFERENTIAL/PLATELET
BASO%: 0.7 % (ref 0.0–2.0)
Basophils Absolute: 0 10*3/uL (ref 0.0–0.1)
EOS%: 1.9 % (ref 0.0–7.0)
Eosinophils Absolute: 0.1 10*3/uL (ref 0.0–0.5)
HCT: 34.6 % — ABNORMAL LOW (ref 34.8–46.6)
HGB: 11.4 g/dL — ABNORMAL LOW (ref 11.6–15.9)
LYMPH%: 41.2 % (ref 14.0–49.7)
MCH: 30.8 pg (ref 25.1–34.0)
MCHC: 32.9 g/dL (ref 31.5–36.0)
MCV: 93.5 fL (ref 79.5–101.0)
MONO#: 0.5 10*3/uL (ref 0.1–0.9)
MONO%: 11.4 % (ref 0.0–14.0)
NEUT#: 1.9 10*3/uL (ref 1.5–6.5)
NEUT%: 44.8 % (ref 38.4–76.8)
Platelets: 259 10*3/uL (ref 145–400)
RBC: 3.7 10*6/uL (ref 3.70–5.45)
RDW: 14 % (ref 11.2–14.5)
WBC: 4.2 10*3/uL (ref 3.9–10.3)
lymph#: 1.7 10*3/uL (ref 0.9–3.3)
nRBC: 0 % (ref 0–0)

## 2010-10-19 LAB — PROTIME-INR
INR: 1.7 — ABNORMAL LOW (ref 2.00–3.50)
Protime: 20.4 Seconds — ABNORMAL HIGH (ref 10.6–13.4)

## 2010-10-26 ENCOUNTER — Other Ambulatory Visit (HOSPITAL_COMMUNITY): Payer: Self-pay | Admitting: Oncology

## 2010-10-26 ENCOUNTER — Encounter (HOSPITAL_BASED_OUTPATIENT_CLINIC_OR_DEPARTMENT_OTHER): Payer: Medicare Other | Admitting: Oncology

## 2010-10-26 DIAGNOSIS — C9000 Multiple myeloma not having achieved remission: Secondary | ICD-10-CM

## 2010-10-26 DIAGNOSIS — D649 Anemia, unspecified: Secondary | ICD-10-CM

## 2010-10-26 DIAGNOSIS — Z7901 Long term (current) use of anticoagulants: Secondary | ICD-10-CM

## 2010-10-26 LAB — CBC WITH DIFFERENTIAL/PLATELET
BASO%: 1.2 % (ref 0.0–2.0)
Basophils Absolute: 0.1 10*3/uL (ref 0.0–0.1)
EOS%: 3 % (ref 0.0–7.0)
Eosinophils Absolute: 0.1 10*3/uL (ref 0.0–0.5)
HCT: 35.8 % (ref 34.8–46.6)
HGB: 11.7 g/dL (ref 11.6–15.9)
LYMPH%: 39 % (ref 14.0–49.7)
MCH: 30.7 pg (ref 25.1–34.0)
MCHC: 32.7 g/dL (ref 31.5–36.0)
MCV: 94 fL (ref 79.5–101.0)
MONO#: 0.4 10*3/uL (ref 0.1–0.9)
MONO%: 9.5 % (ref 0.0–14.0)
NEUT#: 2 10*3/uL (ref 1.5–6.5)
NEUT%: 47.3 % (ref 38.4–76.8)
Platelets: 212 10*3/uL (ref 145–400)
RBC: 3.81 10*6/uL (ref 3.70–5.45)
RDW: 14.4 % (ref 11.2–14.5)
WBC: 4.3 10*3/uL (ref 3.9–10.3)
lymph#: 1.7 10*3/uL (ref 0.9–3.3)

## 2010-10-26 LAB — PROTIME-INR
INR: 1.9 — ABNORMAL LOW (ref 2.00–3.50)
Protime: 22.8 Seconds — ABNORMAL HIGH (ref 10.6–13.4)

## 2010-10-30 LAB — KAPPA/LAMBDA LIGHT CHAINS
Kappa free light chain: 7.47 mg/dL — ABNORMAL HIGH (ref 0.33–1.94)
Kappa:Lambda Ratio: 4.5 — ABNORMAL HIGH (ref 0.26–1.65)
Lambda Free Lght Chn: 1.66 mg/dL (ref 0.57–2.63)

## 2010-10-30 LAB — IMMUNOFIXATION ELECTROPHORESIS
IgA: 56 mg/dL — ABNORMAL LOW (ref 68–380)
IgG (Immunoglobin G), Serum: 932 mg/dL (ref 690–1700)
IgM, Serum: 25 mg/dL — ABNORMAL LOW (ref 52–322)
Total Protein, Serum Electrophoresis: 6.8 g/dL (ref 6.0–8.3)

## 2010-10-30 LAB — COMPREHENSIVE METABOLIC PANEL
ALT: 22 U/L (ref 0–35)
AST: 19 U/L (ref 0–37)
Albumin: 4.1 g/dL (ref 3.5–5.2)
Alkaline Phosphatase: 84 U/L (ref 39–117)
BUN: 15 mg/dL (ref 6–23)
CO2: 24 mEq/L (ref 19–32)
Calcium: 9 mg/dL (ref 8.4–10.5)
Chloride: 108 mEq/L (ref 96–112)
Creatinine, Ser: 0.84 mg/dL (ref 0.50–1.10)
Glucose, Bld: 116 mg/dL — ABNORMAL HIGH (ref 70–99)
Potassium: 4.1 mEq/L (ref 3.5–5.3)
Sodium: 141 mEq/L (ref 135–145)
Total Bilirubin: 0.3 mg/dL (ref 0.3–1.2)
Total Protein: 6.8 g/dL (ref 6.0–8.3)

## 2010-10-30 LAB — LACTATE DEHYDROGENASE: LDH: 175 U/L (ref 94–250)

## 2010-11-09 ENCOUNTER — Other Ambulatory Visit (HOSPITAL_COMMUNITY): Payer: Self-pay | Admitting: Oncology

## 2010-11-09 ENCOUNTER — Encounter: Payer: Medicare Other | Admitting: Oncology

## 2010-11-09 LAB — CBC WITH DIFFERENTIAL/PLATELET
BASO%: 1.5 % (ref 0.0–2.0)
Basophils Absolute: 0.1 10*3/uL (ref 0.0–0.1)
EOS%: 5.6 % (ref 0.0–7.0)
Eosinophils Absolute: 0.2 10*3/uL (ref 0.0–0.5)
HCT: 37 % (ref 34.8–46.6)
HGB: 11.8 g/dL (ref 11.6–15.9)
LYMPH%: 39.3 % (ref 14.0–49.7)
MCH: 30.9 pg (ref 25.1–34.0)
MCHC: 31.9 g/dL (ref 31.5–36.0)
MCV: 96.9 fL (ref 79.5–101.0)
MONO#: 0.5 10*3/uL (ref 0.1–0.9)
MONO%: 12.2 % (ref 0.0–14.0)
NEUT#: 1.7 10*3/uL (ref 1.5–6.5)
NEUT%: 41.4 % (ref 38.4–76.8)
Platelets: 206 10*3/uL (ref 145–400)
RBC: 3.82 10*6/uL (ref 3.70–5.45)
RDW: 14.8 % — ABNORMAL HIGH (ref 11.2–14.5)
WBC: 4.1 10*3/uL (ref 3.9–10.3)
lymph#: 1.6 10*3/uL (ref 0.9–3.3)

## 2010-11-09 LAB — PROTIME-INR
INR: 1.8 — ABNORMAL LOW (ref 2.00–3.50)
Protime: 21.6 Seconds — ABNORMAL HIGH (ref 10.6–13.4)

## 2010-11-22 ENCOUNTER — Encounter (HOSPITAL_BASED_OUTPATIENT_CLINIC_OR_DEPARTMENT_OTHER): Payer: Medicare Other | Admitting: Oncology

## 2010-11-22 ENCOUNTER — Other Ambulatory Visit (HOSPITAL_COMMUNITY): Payer: Self-pay | Admitting: Oncology

## 2010-11-22 DIAGNOSIS — Z7901 Long term (current) use of anticoagulants: Secondary | ICD-10-CM

## 2010-11-22 DIAGNOSIS — C9000 Multiple myeloma not having achieved remission: Secondary | ICD-10-CM

## 2010-11-22 LAB — CBC WITH DIFFERENTIAL/PLATELET
BASO%: 1.2 % (ref 0.0–2.0)
Basophils Absolute: 0 10*3/uL (ref 0.0–0.1)
EOS%: 4.6 % (ref 0.0–7.0)
Eosinophils Absolute: 0.2 10*3/uL (ref 0.0–0.5)
HCT: 37 % (ref 34.8–46.6)
HGB: 12 g/dL (ref 11.6–15.9)
LYMPH%: 45.7 % (ref 14.0–49.7)
MCH: 30.5 pg (ref 25.1–34.0)
MCHC: 32.4 g/dL (ref 31.5–36.0)
MCV: 94.1 fL (ref 79.5–101.0)
MONO#: 0.3 10*3/uL (ref 0.1–0.9)
MONO%: 8.4 % (ref 0.0–14.0)
NEUT#: 1.4 10*3/uL — ABNORMAL LOW (ref 1.5–6.5)
NEUT%: 40.1 % (ref 38.4–76.8)
Platelets: 180 10*3/uL (ref 145–400)
RBC: 3.93 10*6/uL (ref 3.70–5.45)
RDW: 14.5 % (ref 11.2–14.5)
WBC: 3.5 10*3/uL — ABNORMAL LOW (ref 3.9–10.3)
lymph#: 1.6 10*3/uL (ref 0.9–3.3)
nRBC: 0 % (ref 0–0)

## 2010-11-22 LAB — COMPREHENSIVE METABOLIC PANEL
ALT: 22 U/L (ref 0–35)
AST: 21 U/L (ref 0–37)
Albumin: 4.3 g/dL (ref 3.5–5.2)
Alkaline Phosphatase: 68 U/L (ref 39–117)
BUN: 14 mg/dL (ref 6–23)
CO2: 24 mEq/L (ref 19–32)
Calcium: 9.1 mg/dL (ref 8.4–10.5)
Chloride: 107 mEq/L (ref 96–112)
Creatinine, Ser: 0.86 mg/dL (ref 0.50–1.10)
Glucose, Bld: 86 mg/dL (ref 70–99)
Potassium: 4.2 mEq/L (ref 3.5–5.3)
Sodium: 142 mEq/L (ref 135–145)
Total Bilirubin: 0.4 mg/dL (ref 0.3–1.2)
Total Protein: 6.5 g/dL (ref 6.0–8.3)

## 2010-11-22 LAB — PROTIME-INR
INR: 1.6 — ABNORMAL LOW (ref 2.00–3.50)
Protime: 19.2 Seconds — ABNORMAL HIGH (ref 10.6–13.4)

## 2010-11-22 LAB — LACTATE DEHYDROGENASE: LDH: 244 U/L (ref 94–250)

## 2010-11-29 ENCOUNTER — Other Ambulatory Visit (HOSPITAL_COMMUNITY): Payer: Self-pay | Admitting: Oncology

## 2010-11-29 ENCOUNTER — Encounter (HOSPITAL_BASED_OUTPATIENT_CLINIC_OR_DEPARTMENT_OTHER): Payer: Medicare Other | Admitting: Oncology

## 2010-11-29 DIAGNOSIS — C9000 Multiple myeloma not having achieved remission: Secondary | ICD-10-CM

## 2010-11-29 LAB — PROTIME-INR
INR: 1.5 — ABNORMAL LOW (ref 2.00–3.50)
Protime: 18 Seconds — ABNORMAL HIGH (ref 10.6–13.4)

## 2010-11-29 LAB — CBC WITH DIFFERENTIAL/PLATELET
BASO%: 1.9 % (ref 0.0–2.0)
Basophils Absolute: 0.1 10*3/uL (ref 0.0–0.1)
EOS%: 2.7 % (ref 0.0–7.0)
Eosinophils Absolute: 0.1 10*3/uL (ref 0.0–0.5)
HCT: 37.2 % (ref 34.8–46.6)
HGB: 12 g/dL (ref 11.6–15.9)
LYMPH%: 44.8 % (ref 14.0–49.7)
MCH: 30.6 pg (ref 25.1–34.0)
MCHC: 32.3 g/dL (ref 31.5–36.0)
MCV: 94.9 fL (ref 79.5–101.0)
MONO#: 0.5 10*3/uL (ref 0.1–0.9)
MONO%: 11.9 % (ref 0.0–14.0)
NEUT#: 1.5 10*3/uL (ref 1.5–6.5)
NEUT%: 38.7 % (ref 38.4–76.8)
Platelets: 195 10*3/uL (ref 145–400)
RBC: 3.92 10*6/uL (ref 3.70–5.45)
RDW: 14.3 % (ref 11.2–14.5)
WBC: 3.8 10*3/uL — ABNORMAL LOW (ref 3.9–10.3)
lymph#: 1.7 10*3/uL (ref 0.9–3.3)
nRBC: 0 % (ref 0–0)

## 2010-12-06 ENCOUNTER — Other Ambulatory Visit (HOSPITAL_COMMUNITY): Payer: Self-pay | Admitting: Oncology

## 2010-12-06 ENCOUNTER — Encounter (HOSPITAL_BASED_OUTPATIENT_CLINIC_OR_DEPARTMENT_OTHER): Payer: Medicare Other | Admitting: Oncology

## 2010-12-06 DIAGNOSIS — Z7901 Long term (current) use of anticoagulants: Secondary | ICD-10-CM

## 2010-12-06 LAB — PROTIME-INR
INR: 1.4 — ABNORMAL LOW (ref 2.00–3.50)
Protime: 16.8 Seconds — ABNORMAL HIGH (ref 10.6–13.4)

## 2010-12-07 ENCOUNTER — Encounter: Payer: Medicare Other | Admitting: Oncology

## 2010-12-07 ENCOUNTER — Other Ambulatory Visit (HOSPITAL_COMMUNITY): Payer: Self-pay | Admitting: Oncology

## 2010-12-07 DIAGNOSIS — Z7901 Long term (current) use of anticoagulants: Secondary | ICD-10-CM

## 2010-12-07 DIAGNOSIS — C9000 Multiple myeloma not having achieved remission: Secondary | ICD-10-CM

## 2010-12-07 DIAGNOSIS — D649 Anemia, unspecified: Secondary | ICD-10-CM

## 2010-12-07 LAB — CBC WITH DIFFERENTIAL/PLATELET
BASO%: 0.7 % (ref 0.0–2.0)
Basophils Absolute: 0 10*3/uL (ref 0.0–0.1)
EOS%: 1.8 % (ref 0.0–7.0)
Eosinophils Absolute: 0.1 10*3/uL (ref 0.0–0.5)
HCT: 34.5 % — ABNORMAL LOW (ref 34.8–46.6)
HGB: 11.5 g/dL — ABNORMAL LOW (ref 11.6–15.9)
LYMPH%: 35.5 % (ref 14.0–49.7)
MCH: 31.9 pg (ref 25.1–34.0)
MCHC: 33.3 g/dL (ref 31.5–36.0)
MCV: 95.8 fL (ref 79.5–101.0)
MONO#: 0.3 10*3/uL (ref 0.1–0.9)
MONO%: 8.7 % (ref 0.0–14.0)
NEUT#: 1.8 10*3/uL (ref 1.5–6.5)
NEUT%: 53.3 % (ref 38.4–76.8)
Platelets: 184 10*3/uL (ref 145–400)
RBC: 3.61 10*6/uL — ABNORMAL LOW (ref 3.70–5.45)
RDW: 15.1 % — ABNORMAL HIGH (ref 11.2–14.5)
WBC: 3.4 10*3/uL — ABNORMAL LOW (ref 3.9–10.3)
lymph#: 1.2 10*3/uL (ref 0.9–3.3)

## 2010-12-14 ENCOUNTER — Other Ambulatory Visit (HOSPITAL_COMMUNITY): Payer: Self-pay | Admitting: Oncology

## 2010-12-14 ENCOUNTER — Encounter (HOSPITAL_BASED_OUTPATIENT_CLINIC_OR_DEPARTMENT_OTHER): Payer: Medicare Other | Admitting: Oncology

## 2010-12-14 DIAGNOSIS — C9000 Multiple myeloma not having achieved remission: Secondary | ICD-10-CM

## 2010-12-14 DIAGNOSIS — D649 Anemia, unspecified: Secondary | ICD-10-CM

## 2010-12-14 DIAGNOSIS — Z7901 Long term (current) use of anticoagulants: Secondary | ICD-10-CM

## 2010-12-14 LAB — CBC WITH DIFFERENTIAL/PLATELET
BASO%: 0.3 % (ref 0.0–2.0)
Basophils Absolute: 0 10*3/uL (ref 0.0–0.1)
EOS%: 1.6 % (ref 0.0–7.0)
Eosinophils Absolute: 0.1 10*3/uL (ref 0.0–0.5)
HCT: 35.9 % (ref 34.8–46.6)
HGB: 12 g/dL (ref 11.6–15.9)
LYMPH%: 36.2 % (ref 14.0–49.7)
MCH: 32.2 pg (ref 25.1–34.0)
MCHC: 33.3 g/dL (ref 31.5–36.0)
MCV: 96.6 fL (ref 79.5–101.0)
MONO#: 0.4 10*3/uL (ref 0.1–0.9)
MONO%: 11.6 % (ref 0.0–14.0)
NEUT#: 1.9 10*3/uL (ref 1.5–6.5)
NEUT%: 50.3 % (ref 38.4–76.8)
Platelets: 174 10*3/uL (ref 145–400)
RBC: 3.72 10*6/uL (ref 3.70–5.45)
RDW: 15.7 % — ABNORMAL HIGH (ref 11.2–14.5)
WBC: 3.8 10*3/uL — ABNORMAL LOW (ref 3.9–10.3)
lymph#: 1.4 10*3/uL (ref 0.9–3.3)

## 2010-12-14 LAB — PROTIME-INR
INR: 1.4 — ABNORMAL LOW (ref 2.00–3.50)
Protime: 16.8 Seconds — ABNORMAL HIGH (ref 10.6–13.4)

## 2010-12-20 ENCOUNTER — Encounter (HOSPITAL_BASED_OUTPATIENT_CLINIC_OR_DEPARTMENT_OTHER): Payer: Medicare Other | Admitting: Oncology

## 2010-12-20 ENCOUNTER — Other Ambulatory Visit (HOSPITAL_COMMUNITY): Payer: Self-pay | Admitting: Oncology

## 2010-12-20 DIAGNOSIS — Z7901 Long term (current) use of anticoagulants: Secondary | ICD-10-CM

## 2010-12-20 DIAGNOSIS — C9000 Multiple myeloma not having achieved remission: Secondary | ICD-10-CM

## 2010-12-20 LAB — PROTIME-INR
INR: 1.9 — ABNORMAL LOW (ref 2.00–3.50)
Protime: 22.8 Seconds — ABNORMAL HIGH (ref 10.6–13.4)

## 2010-12-20 LAB — CBC WITH DIFFERENTIAL/PLATELET
BASO%: 0.3 % (ref 0.0–2.0)
Basophils Absolute: 0 10*3/uL (ref 0.0–0.1)
EOS%: 1.8 % (ref 0.0–7.0)
Eosinophils Absolute: 0.1 10*3/uL (ref 0.0–0.5)
HCT: 34.7 % — ABNORMAL LOW (ref 34.8–46.6)
HGB: 11.4 g/dL — ABNORMAL LOW (ref 11.6–15.9)
LYMPH%: 40 % (ref 14.0–49.7)
MCH: 32 pg (ref 25.1–34.0)
MCHC: 32.7 g/dL (ref 31.5–36.0)
MCV: 97.7 fL (ref 79.5–101.0)
MONO#: 0.5 10*3/uL (ref 0.1–0.9)
MONO%: 11.7 % (ref 0.0–14.0)
NEUT#: 1.9 10*3/uL (ref 1.5–6.5)
NEUT%: 46.2 % (ref 38.4–76.8)
Platelets: 172 10*3/uL (ref 145–400)
RBC: 3.55 10*6/uL — ABNORMAL LOW (ref 3.70–5.45)
RDW: 15.5 % — ABNORMAL HIGH (ref 11.2–14.5)
WBC: 4.2 10*3/uL (ref 3.9–10.3)
lymph#: 1.7 10*3/uL (ref 0.9–3.3)

## 2010-12-20 LAB — PROTEIN / CREATININE RATIO, URINE
Creatinine, Urine: 106.6 mg/dL
Protein Creatinine Ratio: 0.05 (ref <0.15)
Total Protein, Urine: 5 mg/dL

## 2010-12-21 LAB — COMPREHENSIVE METABOLIC PANEL
ALT: 18 U/L (ref 0–35)
AST: 17 U/L (ref 0–37)
Albumin: 4.4 g/dL (ref 3.5–5.2)
Alkaline Phosphatase: 64 U/L (ref 39–117)
BUN: 14 mg/dL (ref 6–23)
CO2: 25 mEq/L (ref 19–32)
Calcium: 8.8 mg/dL (ref 8.4–10.5)
Chloride: 106 mEq/L (ref 96–112)
Creatinine, Ser: 1 mg/dL (ref 0.50–1.10)
Glucose, Bld: 78 mg/dL (ref 70–99)
Potassium: 4.1 mEq/L (ref 3.5–5.3)
Sodium: 144 mEq/L (ref 135–145)
Total Bilirubin: 0.3 mg/dL (ref 0.3–1.2)
Total Protein: 6.5 g/dL (ref 6.0–8.3)

## 2010-12-21 LAB — URIC ACID: Uric Acid, Serum: 2.9 mg/dL (ref 2.4–7.0)

## 2010-12-21 LAB — BETA 2 MICROGLOBULIN, SERUM: Beta-2 Microglobulin: 1.66 mg/L (ref 1.01–1.73)

## 2010-12-21 LAB — KAPPA/LAMBDA LIGHT CHAINS
Kappa free light chain: 7.43 mg/dL — ABNORMAL HIGH (ref 0.33–1.94)
Kappa:Lambda Ratio: 5.02 — ABNORMAL HIGH (ref 0.26–1.65)
Lambda Free Lght Chn: 1.48 mg/dL (ref 0.57–2.63)

## 2010-12-21 LAB — LACTATE DEHYDROGENASE: LDH: 186 U/L (ref 94–250)

## 2010-12-28 ENCOUNTER — Encounter (HOSPITAL_BASED_OUTPATIENT_CLINIC_OR_DEPARTMENT_OTHER): Payer: Medicare Other | Admitting: Oncology

## 2010-12-28 ENCOUNTER — Other Ambulatory Visit (HOSPITAL_COMMUNITY): Payer: Self-pay | Admitting: Oncology

## 2010-12-28 DIAGNOSIS — C9000 Multiple myeloma not having achieved remission: Secondary | ICD-10-CM

## 2010-12-28 DIAGNOSIS — Z7901 Long term (current) use of anticoagulants: Secondary | ICD-10-CM

## 2010-12-28 DIAGNOSIS — D649 Anemia, unspecified: Secondary | ICD-10-CM

## 2010-12-28 LAB — CBC WITH DIFFERENTIAL/PLATELET
BASO%: 0.5 % (ref 0.0–2.0)
Basophils Absolute: 0 10*3/uL (ref 0.0–0.1)
EOS%: 1 % (ref 0.0–7.0)
Eosinophils Absolute: 0.1 10*3/uL (ref 0.0–0.5)
HCT: 37.9 % (ref 34.8–46.6)
HGB: 12.3 g/dL (ref 11.6–15.9)
LYMPH%: 36.6 % (ref 14.0–49.7)
MCH: 30.9 pg (ref 25.1–34.0)
MCHC: 32.5 g/dL (ref 31.5–36.0)
MCV: 95.2 fL (ref 79.5–101.0)
MONO#: 0.9 10*3/uL (ref 0.1–0.9)
MONO%: 16 % — ABNORMAL HIGH (ref 0.0–14.0)
NEUT#: 2.6 10*3/uL (ref 1.5–6.5)
NEUT%: 45.9 % (ref 38.4–76.8)
Platelets: 189 10*3/uL (ref 145–400)
RBC: 3.98 10*6/uL (ref 3.70–5.45)
RDW: 14.8 % — ABNORMAL HIGH (ref 11.2–14.5)
WBC: 5.8 10*3/uL (ref 3.9–10.3)
lymph#: 2.1 10*3/uL (ref 0.9–3.3)
nRBC: 0 % (ref 0–0)

## 2010-12-28 LAB — PROTIME-INR
INR: 1.4 — ABNORMAL LOW (ref 2.00–3.50)
Protime: 16.8 Seconds — ABNORMAL HIGH (ref 10.6–13.4)

## 2011-01-04 ENCOUNTER — Other Ambulatory Visit (HOSPITAL_COMMUNITY): Payer: Self-pay | Admitting: Oncology

## 2011-01-04 ENCOUNTER — Encounter (HOSPITAL_BASED_OUTPATIENT_CLINIC_OR_DEPARTMENT_OTHER): Payer: Medicare Other | Admitting: Oncology

## 2011-01-04 DIAGNOSIS — C9000 Multiple myeloma not having achieved remission: Secondary | ICD-10-CM

## 2011-01-04 DIAGNOSIS — Z7901 Long term (current) use of anticoagulants: Secondary | ICD-10-CM

## 2011-01-04 DIAGNOSIS — D649 Anemia, unspecified: Secondary | ICD-10-CM

## 2011-01-04 LAB — CBC WITH DIFFERENTIAL/PLATELET
BASO%: 0.9 % (ref 0.0–2.0)
Basophils Absolute: 0 10*3/uL (ref 0.0–0.1)
EOS%: 1.4 % (ref 0.0–7.0)
Eosinophils Absolute: 0.1 10*3/uL (ref 0.0–0.5)
HCT: 37.2 % (ref 34.8–46.6)
HGB: 11.9 g/dL (ref 11.6–15.9)
LYMPH%: 45.3 % (ref 14.0–49.7)
MCH: 30.6 pg (ref 25.1–34.0)
MCHC: 32 g/dL (ref 31.5–36.0)
MCV: 95.6 fL (ref 79.5–101.0)
MONO#: 0.5 10*3/uL (ref 0.1–0.9)
MONO%: 11.3 % (ref 0.0–14.0)
NEUT#: 1.7 10*3/uL (ref 1.5–6.5)
NEUT%: 41.1 % (ref 38.4–76.8)
Platelets: 193 10*3/uL (ref 145–400)
RBC: 3.89 10*6/uL (ref 3.70–5.45)
RDW: 15.2 % — ABNORMAL HIGH (ref 11.2–14.5)
WBC: 4.2 10*3/uL (ref 3.9–10.3)
lymph#: 1.9 10*3/uL (ref 0.9–3.3)
nRBC: 0 % (ref 0–0)

## 2011-01-04 LAB — PROTIME-INR
INR: 1.4 — ABNORMAL LOW (ref 2.00–3.50)
Protime: 16.8 Seconds — ABNORMAL HIGH (ref 10.6–13.4)

## 2011-01-04 LAB — TECHNOLOGIST REVIEW

## 2011-01-11 ENCOUNTER — Other Ambulatory Visit (HOSPITAL_COMMUNITY): Payer: Self-pay | Admitting: Oncology

## 2011-01-11 ENCOUNTER — Encounter (HOSPITAL_BASED_OUTPATIENT_CLINIC_OR_DEPARTMENT_OTHER): Payer: Medicare Other | Admitting: Oncology

## 2011-01-11 DIAGNOSIS — D649 Anemia, unspecified: Secondary | ICD-10-CM

## 2011-01-11 DIAGNOSIS — Z7901 Long term (current) use of anticoagulants: Secondary | ICD-10-CM

## 2011-01-11 DIAGNOSIS — C9000 Multiple myeloma not having achieved remission: Secondary | ICD-10-CM

## 2011-01-11 LAB — CBC WITH DIFFERENTIAL/PLATELET
BASO%: 2.4 % — ABNORMAL HIGH (ref 0.0–2.0)
Basophils Absolute: 0.1 10*3/uL (ref 0.0–0.1)
EOS%: 1.1 % (ref 0.0–7.0)
Eosinophils Absolute: 0.1 10*3/uL (ref 0.0–0.5)
HCT: 37.2 % (ref 34.8–46.6)
HGB: 12 g/dL (ref 11.6–15.9)
LYMPH%: 38.9 % (ref 14.0–49.7)
MCH: 30.8 pg (ref 25.1–34.0)
MCHC: 32.3 g/dL (ref 31.5–36.0)
MCV: 95.6 fL (ref 79.5–101.0)
MONO#: 0.6 10*3/uL (ref 0.1–0.9)
MONO%: 14.2 % — ABNORMAL HIGH (ref 0.0–14.0)
NEUT#: 2 10*3/uL (ref 1.5–6.5)
NEUT%: 43.4 % (ref 38.4–76.8)
Platelets: 221 10*3/uL (ref 145–400)
RBC: 3.89 10*6/uL (ref 3.70–5.45)
RDW: 15 % — ABNORMAL HIGH (ref 11.2–14.5)
WBC: 4.5 10*3/uL (ref 3.9–10.3)
lymph#: 1.8 10*3/uL (ref 0.9–3.3)
nRBC: 0 % (ref 0–0)

## 2011-01-11 LAB — PROTIME-INR
INR: 1.5 — ABNORMAL LOW (ref 2.00–3.50)
Protime: 18 Seconds — ABNORMAL HIGH (ref 10.6–13.4)

## 2011-01-22 ENCOUNTER — Other Ambulatory Visit (HOSPITAL_COMMUNITY): Payer: Self-pay | Admitting: Oncology

## 2011-01-22 ENCOUNTER — Encounter (HOSPITAL_BASED_OUTPATIENT_CLINIC_OR_DEPARTMENT_OTHER): Payer: Medicare Other | Admitting: Oncology

## 2011-01-22 DIAGNOSIS — C9000 Multiple myeloma not having achieved remission: Secondary | ICD-10-CM

## 2011-01-22 DIAGNOSIS — D649 Anemia, unspecified: Secondary | ICD-10-CM

## 2011-01-22 DIAGNOSIS — Z7901 Long term (current) use of anticoagulants: Secondary | ICD-10-CM

## 2011-01-22 LAB — PROTIME-INR
INR: 1.4 — ABNORMAL LOW (ref 2.00–3.50)
Protime: 16.8 Seconds — ABNORMAL HIGH (ref 10.6–13.4)

## 2011-01-22 LAB — URINALYSIS, MICROSCOPIC - CHCC
Bilirubin (Urine): NEGATIVE
Glucose: NEGATIVE g/dL
Ketones: NEGATIVE mg/dL
Nitrite: NEGATIVE
Protein: <30 mg/dL
Specific Gravity, Urine: 1.02 (ref 1.003–1.035)
pH: 6 (ref 4.6–8.0)

## 2011-01-22 LAB — CBC WITH DIFFERENTIAL/PLATELET
BASO%: 0.6 % (ref 0.0–2.0)
Basophils Absolute: 0 10*3/uL (ref 0.0–0.1)
EOS%: 1.4 % (ref 0.0–7.0)
Eosinophils Absolute: 0.1 10*3/uL (ref 0.0–0.5)
HCT: 36 % (ref 34.8–46.6)
HGB: 11.5 g/dL — ABNORMAL LOW (ref 11.6–15.9)
LYMPH%: 38.1 % (ref 14.0–49.7)
MCH: 30.6 pg (ref 25.1–34.0)
MCHC: 31.9 g/dL (ref 31.5–36.0)
MCV: 95.7 fL (ref 79.5–101.0)
MONO#: 0.5 10*3/uL (ref 0.1–0.9)
MONO%: 9.6 % (ref 0.0–14.0)
NEUT#: 2.6 10*3/uL (ref 1.5–6.5)
NEUT%: 50.3 % (ref 38.4–76.8)
Platelets: 201 10*3/uL (ref 145–400)
RBC: 3.76 10*6/uL (ref 3.70–5.45)
RDW: 14.9 % — ABNORMAL HIGH (ref 11.2–14.5)
WBC: 5.1 10*3/uL (ref 3.9–10.3)
lymph#: 2 10*3/uL (ref 0.9–3.3)
nRBC: 0 % (ref 0–0)

## 2011-01-22 LAB — PROTEIN / CREATININE RATIO, URINE
Creatinine, Urine: 111.9 mg/dL
Protein Creatinine Ratio: 0.23 — ABNORMAL HIGH (ref <0.15)
Total Protein, Urine: 26 mg/dL

## 2011-01-23 LAB — COMPREHENSIVE METABOLIC PANEL
ALT: 12 U/L (ref 0–35)
AST: 18 U/L (ref 0–37)
Albumin: 4.4 g/dL (ref 3.5–5.2)
Alkaline Phosphatase: 61 U/L (ref 39–117)
BUN: 19 mg/dL (ref 6–23)
CO2: 22 mEq/L (ref 19–32)
Calcium: 9.2 mg/dL (ref 8.4–10.5)
Chloride: 108 mEq/L (ref 96–112)
Creatinine, Ser: 0.86 mg/dL (ref 0.50–1.10)
Glucose, Bld: 112 mg/dL — ABNORMAL HIGH (ref 70–99)
Potassium: 4.2 mEq/L (ref 3.5–5.3)
Sodium: 140 mEq/L (ref 135–145)
Total Bilirubin: 0.2 mg/dL — ABNORMAL LOW (ref 0.3–1.2)
Total Protein: 6.9 g/dL (ref 6.0–8.3)

## 2011-01-23 LAB — BETA 2 MICROGLOBULIN, SERUM: Beta-2 Microglobulin: 1.59 mg/L (ref 1.01–1.73)

## 2011-01-23 LAB — KAPPA/LAMBDA LIGHT CHAINS
Kappa free light chain: 6.56 mg/dL — ABNORMAL HIGH (ref 0.33–1.94)
Kappa:Lambda Ratio: 4.43 — ABNORMAL HIGH (ref 0.26–1.65)
Lambda Free Lght Chn: 1.48 mg/dL (ref 0.57–2.63)

## 2011-01-23 LAB — LACTATE DEHYDROGENASE: LDH: 196 U/L (ref 94–250)

## 2011-01-25 LAB — URINE CULTURE

## 2011-02-01 ENCOUNTER — Encounter (HOSPITAL_BASED_OUTPATIENT_CLINIC_OR_DEPARTMENT_OTHER): Payer: Medicare Other | Admitting: Oncology

## 2011-02-01 ENCOUNTER — Other Ambulatory Visit (HOSPITAL_COMMUNITY): Payer: Self-pay | Admitting: Oncology

## 2011-02-01 DIAGNOSIS — Z7901 Long term (current) use of anticoagulants: Secondary | ICD-10-CM

## 2011-02-01 DIAGNOSIS — Z5112 Encounter for antineoplastic immunotherapy: Secondary | ICD-10-CM

## 2011-02-01 DIAGNOSIS — Z5181 Encounter for therapeutic drug level monitoring: Secondary | ICD-10-CM

## 2011-02-01 DIAGNOSIS — C9000 Multiple myeloma not having achieved remission: Secondary | ICD-10-CM

## 2011-02-01 DIAGNOSIS — D649 Anemia, unspecified: Secondary | ICD-10-CM

## 2011-02-01 LAB — PROTIME-INR
INR: 1.4 — ABNORMAL LOW (ref 2.00–3.50)
Protime: 16.8 Seconds — ABNORMAL HIGH (ref 10.6–13.4)

## 2011-02-01 LAB — CBC WITH DIFFERENTIAL/PLATELET
BASO%: 0.6 % (ref 0.0–2.0)
Basophils Absolute: 0 10*3/uL (ref 0.0–0.1)
EOS%: 2.9 % (ref 0.0–7.0)
Eosinophils Absolute: 0.2 10*3/uL (ref 0.0–0.5)
HCT: 37.4 % (ref 34.8–46.6)
HGB: 12.2 g/dL (ref 11.6–15.9)
LYMPH%: 31.4 % (ref 14.0–49.7)
MCH: 31.1 pg (ref 25.1–34.0)
MCHC: 32.6 g/dL (ref 31.5–36.0)
MCV: 95.4 fL (ref 79.5–101.0)
MONO#: 0.4 10*3/uL (ref 0.1–0.9)
MONO%: 8.3 % (ref 0.0–14.0)
NEUT#: 2.9 10*3/uL (ref 1.5–6.5)
NEUT%: 56.8 % (ref 38.4–76.8)
Platelets: 167 10*3/uL (ref 145–400)
RBC: 3.92 10*6/uL (ref 3.70–5.45)
RDW: 15 % — ABNORMAL HIGH (ref 11.2–14.5)
WBC: 5.2 10*3/uL (ref 3.9–10.3)
lymph#: 1.6 10*3/uL (ref 0.9–3.3)

## 2011-02-08 ENCOUNTER — Other Ambulatory Visit (HOSPITAL_COMMUNITY): Payer: Self-pay | Admitting: Oncology

## 2011-02-08 ENCOUNTER — Other Ambulatory Visit (HOSPITAL_BASED_OUTPATIENT_CLINIC_OR_DEPARTMENT_OTHER): Payer: Medicare Other | Admitting: Lab

## 2011-02-08 DIAGNOSIS — Z7901 Long term (current) use of anticoagulants: Secondary | ICD-10-CM

## 2011-02-08 DIAGNOSIS — C9000 Multiple myeloma not having achieved remission: Secondary | ICD-10-CM

## 2011-02-08 DIAGNOSIS — D649 Anemia, unspecified: Secondary | ICD-10-CM

## 2011-02-08 LAB — CBC WITH DIFFERENTIAL/PLATELET
BASO%: 0.3 % (ref 0.0–2.0)
Basophils Absolute: 0 10*3/uL (ref 0.0–0.1)
EOS%: 2.2 % (ref 0.0–7.0)
Eosinophils Absolute: 0.1 10*3/uL (ref 0.0–0.5)
HCT: 35.8 % (ref 34.8–46.6)
HGB: 11.7 g/dL (ref 11.6–15.9)
LYMPH%: 38.7 % (ref 14.0–49.7)
MCH: 31.9 pg (ref 25.1–34.0)
MCHC: 32.8 g/dL (ref 31.5–36.0)
MCV: 97.4 fL (ref 79.5–101.0)
MONO#: 0.5 10*3/uL (ref 0.1–0.9)
MONO%: 12.1 % (ref 0.0–14.0)
NEUT#: 1.8 10*3/uL (ref 1.5–6.5)
NEUT%: 46.7 % (ref 38.4–76.8)
Platelets: 167 10*3/uL (ref 145–400)
RBC: 3.67 10*6/uL — ABNORMAL LOW (ref 3.70–5.45)
RDW: 16.5 % — ABNORMAL HIGH (ref 11.2–14.5)
WBC: 3.9 10*3/uL (ref 3.9–10.3)
lymph#: 1.5 10*3/uL (ref 0.9–3.3)

## 2011-02-12 ENCOUNTER — Other Ambulatory Visit: Payer: Self-pay | Admitting: Oncology

## 2011-02-12 DIAGNOSIS — C9000 Multiple myeloma not having achieved remission: Secondary | ICD-10-CM | POA: Insufficient documentation

## 2011-02-13 ENCOUNTER — Encounter: Payer: Self-pay | Admitting: *Deleted

## 2011-02-13 ENCOUNTER — Telehealth: Payer: Self-pay | Admitting: Medical Oncology

## 2011-02-13 ENCOUNTER — Other Ambulatory Visit: Payer: Self-pay | Admitting: *Deleted

## 2011-02-13 DIAGNOSIS — C9000 Multiple myeloma not having achieved remission: Secondary | ICD-10-CM

## 2011-02-13 MED ORDER — LENALIDOMIDE 10 MG PO CAPS: 10.0000 mg | ORAL_CAPSULE | Freq: Every day | ORAL | Status: DC

## 2011-02-13 NOTE — Telephone Encounter (Signed)
I spoke with pt regarding her appointments. She is scheduled for labs and zometa 02/14/11 and then labs MD and Zometa for 02/19/11. I told her to cancel her appointments on the 15th and to come on the 20th. She states she had called about this yesterday but has not heard from anyone. She voiced understanding of her appointments.

## 2011-02-14 ENCOUNTER — Other Ambulatory Visit: Payer: Medicare Other | Admitting: Lab

## 2011-02-14 ENCOUNTER — Ambulatory Visit: Payer: Medicare Other

## 2011-02-14 ENCOUNTER — Encounter: Payer: Self-pay | Admitting: *Deleted

## 2011-02-14 NOTE — Progress Notes (Signed)
RECEIVED A FAX FROM [facility] CONCERNING PRESCRIPTION BENEFITS AND DELIVERY DATE.

## 2011-02-16 ENCOUNTER — Other Ambulatory Visit: Payer: Self-pay | Admitting: Oncology

## 2011-02-19 ENCOUNTER — Ambulatory Visit (HOSPITAL_BASED_OUTPATIENT_CLINIC_OR_DEPARTMENT_OTHER): Payer: Medicare Other

## 2011-02-19 ENCOUNTER — Other Ambulatory Visit (HOSPITAL_COMMUNITY): Payer: Self-pay | Admitting: Oncology

## 2011-02-19 ENCOUNTER — Other Ambulatory Visit (HOSPITAL_BASED_OUTPATIENT_CLINIC_OR_DEPARTMENT_OTHER): Payer: Medicare Other | Admitting: Lab

## 2011-02-19 ENCOUNTER — Ambulatory Visit (HOSPITAL_BASED_OUTPATIENT_CLINIC_OR_DEPARTMENT_OTHER): Payer: Medicare Other | Admitting: Oncology

## 2011-02-19 ENCOUNTER — Telehealth: Payer: Self-pay | Admitting: Oncology

## 2011-02-19 DIAGNOSIS — I10 Essential (primary) hypertension: Secondary | ICD-10-CM

## 2011-02-19 DIAGNOSIS — D649 Anemia, unspecified: Secondary | ICD-10-CM

## 2011-02-19 DIAGNOSIS — C9000 Multiple myeloma not having achieved remission: Secondary | ICD-10-CM

## 2011-02-19 DIAGNOSIS — Z7901 Long term (current) use of anticoagulants: Secondary | ICD-10-CM

## 2011-02-19 DIAGNOSIS — IMO0002 Reserved for concepts with insufficient information to code with codable children: Secondary | ICD-10-CM | POA: Insufficient documentation

## 2011-02-19 LAB — CBC WITH DIFFERENTIAL/PLATELET
BASO%: 1.5 % (ref 0.0–2.0)
Basophils Absolute: 0 10*3/uL (ref 0.0–0.1)
EOS%: 1.1 % (ref 0.0–7.0)
Eosinophils Absolute: 0 10*3/uL (ref 0.0–0.5)
HCT: 33.3 % — ABNORMAL LOW (ref 34.8–46.6)
HGB: 11 g/dL — ABNORMAL LOW (ref 11.6–15.9)
LYMPH%: 45.4 % (ref 14.0–49.7)
MCH: 32 pg (ref 25.1–34.0)
MCHC: 33.1 g/dL (ref 31.5–36.0)
MCV: 96.6 fL (ref 79.5–101.0)
MONO#: 0.4 10*3/uL (ref 0.1–0.9)
MONO%: 11.9 % (ref 0.0–14.0)
NEUT#: 1.4 10*3/uL — ABNORMAL LOW (ref 1.5–6.5)
NEUT%: 40.1 % (ref 38.4–76.8)
Platelets: 181 10*3/uL (ref 145–400)
RBC: 3.45 10*6/uL — ABNORMAL LOW (ref 3.70–5.45)
RDW: 16 % — ABNORMAL HIGH (ref 11.2–14.5)
WBC: 3.4 10*3/uL — ABNORMAL LOW (ref 3.9–10.3)
lymph#: 1.5 10*3/uL (ref 0.9–3.3)

## 2011-02-19 LAB — PROTIME-INR
INR: 1.5 — ABNORMAL LOW (ref 2.00–3.50)
Protime: 18 Seconds — ABNORMAL HIGH (ref 10.6–13.4)

## 2011-02-19 MED ORDER — ALTEPLASE 2 MG IJ SOLR
2.0000 mg | Freq: Once | INTRAMUSCULAR | Status: DC | PRN
  Filled 2011-02-19: qty 2

## 2011-02-19 MED ORDER — SODIUM CHLORIDE 0.9 % IV SOLN
Freq: Once | INTRAVENOUS | Status: AC
  Administered 2011-02-19: 13:00:00 via INTRAVENOUS

## 2011-02-19 MED ORDER — ZOLEDRONIC ACID 4 MG/100ML IV SOLN
4.0000 mg | Freq: Once | INTRAVENOUS | Status: AC
  Administered 2011-02-19: 4 mg via INTRAVENOUS
  Filled 2011-02-19: qty 100

## 2011-02-19 MED ORDER — ZOLEDRONIC ACID 4 MG/5ML IV CONC
4.0000 mg | Freq: Once | INTRAVENOUS | Status: DC
  Filled 2011-02-19: qty 5

## 2011-02-19 NOTE — Progress Notes (Signed)
CC:   [name], MD [name], [name] 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  Details of the patient's myeloma history will be presented below and have been included in her EPIC chart.  Mrs. Curfman is accompanied by her daughter, Gordan Payment, today.  Mrs. Dewberry condition remains stable.  She was last seen by [place] on 01/22/2011.  We have been seeing her monthly.  She has been taking Revlimid 10 mg daily 3 weeks on/1 week off.  The patient had been off of Revlimid for the last week or so and just started taking another cycle of Revlimid yesterday.  She also has been receiving Zometa every 4 weeks.  Both of these treatments started in late June.  The patient also was on prophylactic Coumadin 6 mg daily with an INR that we try to keep at around 1.5.  The patient in general has been doing quite well.  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  That is 50% better than it had been.  For that, she takes Neurontin 600 mg twice a day.  The patient really is without any other significant complaints and overall seems to be doing quite well.  I might add that she was seen recently at [facility] by Dr. Barbaraann Boys; that was on November 8th.  I believe the patient will be seen again in May.  PROBLEMS:  As follows: 1. Multiple myeloma diagnosed in August 2010.  The patient was     diagnosed with kappa light chain disease.  The urine showed IgG and     kappa light chains.  Bone marrow at that time was 50% plasma cells.     The patient was treated with Velcade, Decadron and Doxil 4-1/2     cycles from 04/24/2009 through 07/27/2009.  She developed a     peripheral neuropathy.  Her plasma cells on the next bone marrow,     which was 08/03/2009, were 30%.  The patient then received Revlimid     and Decadron from 09/01/2009 through December 2011.  Bone marrow on     March 01, 2010, showed 5% plasma cells.  The patient then     received high-dose melphalan with autologous stem cell infusion on     04/26/2010.  She appeared to have an excellent response and since     late June 2012 has been on Revlimid and Zometa. 2. 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     Onset of that problem was April 2011.  The patient is receiving     Neurontin for treatment. 3. Hypertension. 4. GERD. 5. Dyslipidemia. 6. Glaucoma. 7. Anemia, unspecified.  MEDICINES:  Reviewed and recorded.  The patient is still on acyclovir 800 mg twice a day.  I believe she will be able to stop that 1 year from her transplant, which would be late January.  As stated, the patient is on Neurontin 600 mg twice a day, Coumadin 6 mg daily.  Prothrombin time has been fairly stable.  PHYSICAL EXAMINATION:  General:  Mrs. Brafford certainly looks well. Weight is up a few pounds, 172 pounds 4.8 ounces, height 5 feet 4-1/2 inches, body surface area 1.89 sq m.  Vital Signs:  Blood pressure in the left arm sitting was 108/60.  Other vital signs are normal.  HEENT: There is no scleral icterus.  Mouth and pharynx are benign.  Lymphatic: No peripheral adenopathy palpable.  Heart and Lungs:  Normal.  Breasts: The patient has regular mammograms and is not due until next year. Abdomen:  Benign.  Extremities:  No peripheral edema, clubbing, calf tenderness.  Back:  No spinal tenderness, although the patient occasionally has some back pain and takes Tylenol.  Neurologic Exam: Normal.  Skin:  Normal.  No Port-A-Cath or central catheter.  LABORATORY DATA:  Today, white count 3.4, ANC 1.4, hemoglobin 11.0, hematocrit 33.3, platelets 181,000.  Prothrombin time was 18.0 with an INR of 1.50.  Chemistries today are pending.  Also pending are serum light chains and beta-2 microglobulin.  Chemistries from 01/22/2011 were normal.  Glucose was 112.  Chemistries from 12/20/2010 were normal. Uric  acid on 12/20/2010 was 2.9.  Beta-2 microglobulins have been normal, most recently 1.5 on 10/23.  Protein determinations most recently on 01/22/2011:  The urine kappa/lambda ratio was 4.43.  The serum immunofixation electrophoresis shows no monoclonal protein; however, the urine immunoelectrophoresis does show monoclonal free kappa light chains.  That was on 10/01/2010.  At that time, the total urine protein was 125 mg.  Urine protein back in December 2010 was approximately 15 g and then on 07/27/2009 8.1 g.  We have data from [place].  The patient had a metastatic bone survey on 02/07/2011.  In looking over that report, I do not see any evidence of myeloma.  The patient does have some degenerative changes.  We have labs from 02/07/2011.  IgG level was 1050, IgA 43 which is low, IgM 21 which is low, IgE was 34.  Kappa/lambda ratio on the serum, I believe, was 4.92, beta-2 microglobulin was 2.1 which is only slightly elevated. Once again, serum immunofixation electrophoresis showed no monoclonal protein in the serum.  The serum protein electrophoresis also did not show any monoclonal protein.  IMPRESSION AND PLAN:  In general, Mrs. Acocella continues to do extremely well.  Because of her ANC of 1.4 and white count of 3.4, we are going to have her hold her Revlimid today.  We will plan to recheck a CBC in 1 week, which will be November 27th.  If the CBC is satisfactory, we will have the patient go ahead with Revlimid 6 mg daily.  We are going to shorten her interval of treatment from 3 weeks on/1 week off to 2 weeks on/1 week off.  She is due for Zometa today 4 mg IV.  We are continuing that every 4 weeks.  She has had dental clearance.  Patient's prothrombin time has been fairly stable.  We will plan to see her again 4 weeks from today, which will be around December 18th, at which time we will check CBC, chemistries, uric acid, urine light chains and urine protein to creatinine ratio.  The  patient will also be due for Zometa. When we see her again, she should be coming off her 1 week of rest from Revlimid and should be ready to start that somewhere around December 18th unless we have had further delays.  In looking over past notes, the patient apparently had a urinary tract infection back about a month ago, somewhere around October 23rd.  Urine grew out greater than 100,000 colonies of E coli that was multisensitive including sensitivities to Cipro and Levaquin.  We also got a notification from [facility] about vaccinations that are being requested 1 year from the time of transplant.  We will see if Dr. [name] wants to go ahead with these inoculations; otherwise, we  will try to obtain the vaccines.    ______________________________ Samul Dada, [name] DSM/MEDQ  D:  02/19/2011  T:  02/19/2011  Job:  [id number]

## 2011-02-19 NOTE — Progress Notes (Signed)
This office note has been dictated.  #[id number]

## 2011-02-19 NOTE — Patient Instructions (Signed)
Patient aware of next appointment; patient has Rx for pain and nausea meds

## 2011-02-19 NOTE — Telephone Encounter (Signed)
gve the pt her nov,dec 2012 appt calendar °

## 2011-02-22 ENCOUNTER — Other Ambulatory Visit (HOSPITAL_COMMUNITY): Payer: Self-pay | Admitting: Oncology

## 2011-02-22 ENCOUNTER — Other Ambulatory Visit (HOSPITAL_BASED_OUTPATIENT_CLINIC_OR_DEPARTMENT_OTHER): Payer: Medicare Other | Admitting: Lab

## 2011-02-22 DIAGNOSIS — D649 Anemia, unspecified: Secondary | ICD-10-CM

## 2011-02-22 DIAGNOSIS — Z7901 Long term (current) use of anticoagulants: Secondary | ICD-10-CM

## 2011-02-22 DIAGNOSIS — C9000 Multiple myeloma not having achieved remission: Secondary | ICD-10-CM

## 2011-02-22 LAB — CBC WITH DIFFERENTIAL/PLATELET
BASO%: 1.4 % (ref 0.0–2.0)
Basophils Absolute: 0.1 10*3/uL (ref 0.0–0.1)
EOS%: 1.2 % (ref 0.0–7.0)
Eosinophils Absolute: 0.1 10*3/uL (ref 0.0–0.5)
HCT: 38.4 % (ref 34.8–46.6)
HGB: 12.3 g/dL (ref 11.6–15.9)
LYMPH%: 47.7 % (ref 14.0–49.7)
MCH: 30.8 pg (ref 25.1–34.0)
MCHC: 32 g/dL (ref 31.5–36.0)
MCV: 96 fL (ref 79.5–101.0)
MONO#: 0.6 10*3/uL (ref 0.1–0.9)
MONO%: 11.5 % (ref 0.0–14.0)
NEUT#: 1.9 10*3/uL (ref 1.5–6.5)
NEUT%: 38.2 % — ABNORMAL LOW (ref 38.4–76.8)
Platelets: 195 10*3/uL (ref 145–400)
RBC: 4 10*6/uL (ref 3.70–5.45)
RDW: 14.9 % — ABNORMAL HIGH (ref 11.2–14.5)
WBC: 5 10*3/uL (ref 3.9–10.3)
lymph#: 2.4 10*3/uL (ref 0.9–3.3)
nRBC: 0 % (ref 0–0)

## 2011-02-22 LAB — LACTATE DEHYDROGENASE: LDH: 206 U/L (ref 94–250)

## 2011-02-22 LAB — COMPREHENSIVE METABOLIC PANEL
ALT: 16 U/L (ref 0–35)
AST: 18 U/L (ref 0–37)
Albumin: 4.4 g/dL (ref 3.5–5.2)
Alkaline Phosphatase: 61 U/L (ref 39–117)
BUN: 12 mg/dL (ref 6–23)
CO2: 24 mEq/L (ref 19–32)
Calcium: 9.7 mg/dL (ref 8.4–10.5)
Chloride: 107 mEq/L (ref 96–112)
Creatinine, Ser: 0.79 mg/dL (ref 0.50–1.10)
Glucose, Bld: 80 mg/dL (ref 70–99)
Potassium: 4.5 mEq/L (ref 3.5–5.3)
Sodium: 141 mEq/L (ref 135–145)
Total Bilirubin: 0.4 mg/dL (ref 0.3–1.2)
Total Protein: 6.7 g/dL (ref 6.0–8.3)

## 2011-02-22 LAB — KAPPA/LAMBDA LIGHT CHAINS
Kappa free light chain: 4.92 mg/dL — ABNORMAL HIGH (ref 0.33–1.94)
Kappa:Lambda Ratio: 3.49 — ABNORMAL HIGH (ref 0.26–1.65)
Lambda Free Lght Chn: 1.41 mg/dL (ref 0.57–2.63)

## 2011-02-22 LAB — BETA 2 MICROGLOBULIN, SERUM: Beta-2 Microglobulin: 1.45 mg/L (ref 1.01–1.73)

## 2011-02-28 ENCOUNTER — Other Ambulatory Visit: Payer: Self-pay

## 2011-02-28 ENCOUNTER — Other Ambulatory Visit (HOSPITAL_BASED_OUTPATIENT_CLINIC_OR_DEPARTMENT_OTHER): Payer: Medicare Other | Admitting: Lab

## 2011-02-28 DIAGNOSIS — C9000 Multiple myeloma not having achieved remission: Secondary | ICD-10-CM

## 2011-02-28 LAB — CBC WITH DIFFERENTIAL/PLATELET
BASO%: 0.9 % (ref 0.0–2.0)
Basophils Absolute: 0.1 10*3/uL (ref 0.0–0.1)
EOS%: 1.1 % (ref 0.0–7.0)
Eosinophils Absolute: 0.1 10*3/uL (ref 0.0–0.5)
HCT: 35.5 % (ref 34.8–46.6)
HGB: 11.4 g/dL — ABNORMAL LOW (ref 11.6–15.9)
LYMPH%: 32.7 % (ref 14.0–49.7)
MCH: 30.5 pg (ref 25.1–34.0)
MCHC: 32.1 g/dL (ref 31.5–36.0)
MCV: 94.9 fL (ref 79.5–101.0)
MONO#: 0.6 10*3/uL (ref 0.1–0.9)
MONO%: 11 % (ref 0.0–14.0)
NEUT#: 3 10*3/uL (ref 1.5–6.5)
NEUT%: 54.3 % (ref 38.4–76.8)
Platelets: 209 10*3/uL (ref 145–400)
RBC: 3.74 10*6/uL (ref 3.70–5.45)
RDW: 14.8 % — ABNORMAL HIGH (ref 11.2–14.5)
WBC: 5.5 10*3/uL (ref 3.9–10.3)
lymph#: 1.8 10*3/uL (ref 0.9–3.3)
nRBC: 0 % (ref 0–0)

## 2011-03-01 ENCOUNTER — Telehealth: Payer: Self-pay

## 2011-03-01 NOTE — Telephone Encounter (Signed)
Pt called about revlimid schedule. Pt to start taking revlimid 10mg  today for 2 weeks and then 1 week off. Pt expressed understanding. Aware of next lab appt

## 2011-03-06 ENCOUNTER — Telehealth: Payer: Self-pay | Admitting: Medical Oncology

## 2011-03-06 ENCOUNTER — Other Ambulatory Visit (HOSPITAL_COMMUNITY): Payer: Self-pay | Admitting: Oncology

## 2011-03-06 ENCOUNTER — Encounter: Payer: Self-pay | Admitting: Medical Oncology

## 2011-03-06 NOTE — Telephone Encounter (Signed)
I called Dr. Carolee Rota nurse April to see they can do pt's post transplant vaccines. She states that they can if we will fax the orders. I faxed a copy to April to [phone]

## 2011-03-07 ENCOUNTER — Other Ambulatory Visit (HOSPITAL_COMMUNITY): Payer: Self-pay | Admitting: Oncology

## 2011-03-07 ENCOUNTER — Other Ambulatory Visit (HOSPITAL_BASED_OUTPATIENT_CLINIC_OR_DEPARTMENT_OTHER): Payer: Medicare Other | Admitting: Lab

## 2011-03-07 DIAGNOSIS — C9000 Multiple myeloma not having achieved remission: Secondary | ICD-10-CM

## 2011-03-07 DIAGNOSIS — D649 Anemia, unspecified: Secondary | ICD-10-CM

## 2011-03-07 DIAGNOSIS — Z7901 Long term (current) use of anticoagulants: Secondary | ICD-10-CM

## 2011-03-07 LAB — CBC WITH DIFFERENTIAL/PLATELET
BASO%: 0.6 % (ref 0.0–2.0)
Basophils Absolute: 0 10*3/uL (ref 0.0–0.1)
EOS%: 2.9 % (ref 0.0–7.0)
Eosinophils Absolute: 0.2 10*3/uL (ref 0.0–0.5)
HCT: 36.7 % (ref 34.8–46.6)
HGB: 11.7 g/dL (ref 11.6–15.9)
LYMPH%: 32.3 % (ref 14.0–49.7)
MCH: 30.9 pg (ref 25.1–34.0)
MCHC: 31.9 g/dL (ref 31.5–36.0)
MCV: 96.8 fL (ref 79.5–101.0)
MONO#: 0.4 10*3/uL (ref 0.1–0.9)
MONO%: 7 % (ref 0.0–14.0)
NEUT#: 3.1 10*3/uL (ref 1.5–6.5)
NEUT%: 57.2 % (ref 38.4–76.8)
Platelets: 200 10*3/uL (ref 145–400)
RBC: 3.79 10*6/uL (ref 3.70–5.45)
RDW: 14.7 % — ABNORMAL HIGH (ref 11.2–14.5)
WBC: 5.5 10*3/uL (ref 3.9–10.3)
lymph#: 1.8 10*3/uL (ref 0.9–3.3)
nRBC: 0 % (ref 0–0)

## 2011-03-11 ENCOUNTER — Telehealth: Payer: Self-pay | Admitting: Medical Oncology

## 2011-03-11 NOTE — Telephone Encounter (Signed)
Pt called and left a message that she went to the pharmacist to pick her coumadin up and she got warfarin 5 mg and only 5 tablets. She is taking 6 mg daily and is not sure why she got only 5 tabs.

## 2011-03-11 NOTE — Telephone Encounter (Signed)
I called pt and left her a message that I am not sure how she got a bottle of warfarin 5 mg with 5 tabs. I explained she is to continue taking the 6 mg tab one daily as she has been doing. If any questions  Please call.

## 2011-03-12 ENCOUNTER — Telehealth: Payer: Self-pay | Admitting: Medical Oncology

## 2011-03-12 ENCOUNTER — Other Ambulatory Visit: Payer: Self-pay | Admitting: *Deleted

## 2011-03-12 ENCOUNTER — Other Ambulatory Visit: Payer: Self-pay | Admitting: Medical Oncology

## 2011-03-12 ENCOUNTER — Encounter: Payer: Self-pay | Admitting: Medical Oncology

## 2011-03-12 DIAGNOSIS — C9000 Multiple myeloma not having achieved remission: Secondary | ICD-10-CM

## 2011-03-12 MED ORDER — WARFARIN SODIUM 6 MG PO TABS: 6.0000 mg | ORAL_TABLET | ORAL | Status: DC

## 2011-03-12 MED ORDER — LENALIDOMIDE 10 MG PO CAPS: ORAL_CAPSULE | ORAL | Status: DC

## 2011-03-12 NOTE — Telephone Encounter (Signed)
Carmen Fisher with [facility] called stating that received a prescription for coumadin 5 mg tablets. Pt has been taking 6mg  daily and she needs to clarify. I told her pt has been taking a 6mg  tab daily. I gave her refills for coumadin 6 mg tablets. She will remove the old prescriptions for 2mg  and 5 mg tablets,

## 2011-03-14 ENCOUNTER — Other Ambulatory Visit (HOSPITAL_BASED_OUTPATIENT_CLINIC_OR_DEPARTMENT_OTHER): Payer: Medicare Other | Admitting: Lab

## 2011-03-14 ENCOUNTER — Ambulatory Visit: Payer: Medicare Other

## 2011-03-14 ENCOUNTER — Other Ambulatory Visit: Payer: Medicare Other | Admitting: Lab

## 2011-03-14 ENCOUNTER — Other Ambulatory Visit (HOSPITAL_COMMUNITY): Payer: Self-pay | Admitting: Oncology

## 2011-03-14 DIAGNOSIS — C9000 Multiple myeloma not having achieved remission: Secondary | ICD-10-CM

## 2011-03-14 DIAGNOSIS — Z7901 Long term (current) use of anticoagulants: Secondary | ICD-10-CM

## 2011-03-14 DIAGNOSIS — D649 Anemia, unspecified: Secondary | ICD-10-CM

## 2011-03-14 LAB — CBC WITH DIFFERENTIAL/PLATELET
BASO%: 0.4 % (ref 0.0–2.0)
Basophils Absolute: 0 10*3/uL (ref 0.0–0.1)
EOS%: 2.5 % (ref 0.0–7.0)
Eosinophils Absolute: 0.1 10*3/uL (ref 0.0–0.5)
HCT: 36.2 % (ref 34.8–46.6)
HGB: 11.6 g/dL (ref 11.6–15.9)
LYMPH%: 34.3 % (ref 14.0–49.7)
MCH: 30.6 pg (ref 25.1–34.0)
MCHC: 32 g/dL (ref 31.5–36.0)
MCV: 95.5 fL (ref 79.5–101.0)
MONO#: 0.9 10*3/uL (ref 0.1–0.9)
MONO%: 16.6 % — ABNORMAL HIGH (ref 0.0–14.0)
NEUT#: 2.6 10*3/uL (ref 1.5–6.5)
NEUT%: 46.2 % (ref 38.4–76.8)
Platelets: 214 10*3/uL (ref 145–400)
RBC: 3.79 10*6/uL (ref 3.70–5.45)
RDW: 14.7 % — ABNORMAL HIGH (ref 11.2–14.5)
WBC: 5.7 10*3/uL (ref 3.9–10.3)
lymph#: 1.9 10*3/uL (ref 0.9–3.3)
nRBC: 0 % (ref 0–0)

## 2011-03-18 ENCOUNTER — Other Ambulatory Visit (HOSPITAL_COMMUNITY): Payer: Self-pay | Admitting: Oncology

## 2011-03-18 NOTE — Progress Notes (Signed)
NOTE TO THE CHART:  Carmen Fisher has been on Revlimid now 2 weeks on, 1 week off.  On 11/20, her ANC was 1.4, and Revlimid was held at that time.  On 11/29, white count was 5.5, ANC 3.0, hemoglobin 11.4, hematocrit 35.5, and platelets 209,000.  The patient was going to start taking Revlimid 10 mg daily, 2 weeks on, 1 week off.  She was actually going to start her Revlimid on November 30th.    ______________________________ Samul Dada, [name] DSM/MEDQ  D:  03/05/2011  T:  03/05/2011  Job:  [id number]

## 2011-03-19 ENCOUNTER — Other Ambulatory Visit (HOSPITAL_BASED_OUTPATIENT_CLINIC_OR_DEPARTMENT_OTHER): Payer: Medicare Other | Admitting: Lab

## 2011-03-19 ENCOUNTER — Other Ambulatory Visit (HOSPITAL_COMMUNITY): Payer: Self-pay | Admitting: Oncology

## 2011-03-19 ENCOUNTER — Ambulatory Visit (HOSPITAL_BASED_OUTPATIENT_CLINIC_OR_DEPARTMENT_OTHER): Payer: Medicare Other

## 2011-03-19 ENCOUNTER — Ambulatory Visit (HOSPITAL_BASED_OUTPATIENT_CLINIC_OR_DEPARTMENT_OTHER): Payer: Medicare Other | Admitting: Oncology

## 2011-03-19 ENCOUNTER — Telehealth: Payer: Self-pay | Admitting: Internal Medicine

## 2011-03-19 DIAGNOSIS — D72819 Decreased white blood cell count, unspecified: Secondary | ICD-10-CM

## 2011-03-19 DIAGNOSIS — C9000 Multiple myeloma not having achieved remission: Secondary | ICD-10-CM

## 2011-03-19 DIAGNOSIS — D709 Neutropenia, unspecified: Secondary | ICD-10-CM

## 2011-03-19 DIAGNOSIS — Z7901 Long term (current) use of anticoagulants: Secondary | ICD-10-CM

## 2011-03-19 DIAGNOSIS — G620 Drug-induced polyneuropathy: Secondary | ICD-10-CM

## 2011-03-19 DIAGNOSIS — D649 Anemia, unspecified: Secondary | ICD-10-CM

## 2011-03-19 LAB — PROTEIN / CREATININE RATIO, URINE
Creatinine, Urine: 149.4 mg/dL
Protein Creatinine Ratio: 0.04 (ref <0.15)
Total Protein, Urine: 6 mg/dL

## 2011-03-19 LAB — PROTIME-INR
INR: 1.5 — ABNORMAL LOW (ref 2.00–3.50)
Protime: 18 Seconds — ABNORMAL HIGH (ref 10.6–13.4)

## 2011-03-19 LAB — CBC WITH DIFFERENTIAL/PLATELET
BASO%: 1 % (ref 0.0–2.0)
Basophils Absolute: 0 10*3/uL (ref 0.0–0.1)
EOS%: 1 % (ref 0.0–7.0)
Eosinophils Absolute: 0 10*3/uL (ref 0.0–0.5)
HCT: 34.2 % — ABNORMAL LOW (ref 34.8–46.6)
HGB: 11.4 g/dL — ABNORMAL LOW (ref 11.6–15.9)
LYMPH%: 46 % (ref 14.0–49.7)
MCH: 32.4 pg (ref 25.1–34.0)
MCHC: 33.3 g/dL (ref 31.5–36.0)
MCV: 97.2 fL (ref 79.5–101.0)
MONO#: 0.3 10*3/uL (ref 0.1–0.9)
MONO%: 7.7 % (ref 0.0–14.0)
NEUT#: 1.7 10*3/uL (ref 1.5–6.5)
NEUT%: 44.3 % (ref 38.4–76.8)
Platelets: 193 10*3/uL (ref 145–400)
RBC: 3.52 10*6/uL — ABNORMAL LOW (ref 3.70–5.45)
RDW: 15.4 % — ABNORMAL HIGH (ref 11.2–14.5)
WBC: 3.9 10*3/uL (ref 3.9–10.3)
lymph#: 1.8 10*3/uL (ref 0.9–3.3)

## 2011-03-19 MED ORDER — HEPARIN SOD (PORK) LOCK FLUSH 100 UNIT/ML IV SOLN
500.0000 [IU] | Freq: Once | INTRAVENOUS | Status: DC | PRN
  Filled 2011-03-19: qty 5

## 2011-03-19 MED ORDER — SODIUM CHLORIDE 0.9 % IJ SOLN
10.0000 mL | INTRAMUSCULAR | Status: DC | PRN
  Filled 2011-03-19: qty 10

## 2011-03-19 MED ORDER — ZOLEDRONIC ACID 4 MG/100ML IV SOLN
4.0000 mg | Freq: Once | INTRAVENOUS | Status: AC
  Administered 2011-03-19: 4 mg via INTRAVENOUS
  Filled 2011-03-19: qty 100

## 2011-03-19 MED ORDER — SODIUM CHLORIDE 0.9 % IV SOLN
Freq: Once | INTRAVENOUS | Status: AC
  Administered 2011-03-19: 14:00:00 via INTRAVENOUS

## 2011-03-19 NOTE — Progress Notes (Signed)
CC:   [name], MD [name], [name] 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  Details of the patient's history will be presented below.  Ms. Sane was last seen by [place] on 02/19/2011.  She has been receiving monthly Zometa 4 mg IV beginning in late June.  She has also been on Revlimid since late June. Recently we had to make some dose modification such that she is on Revlimid 10 mg daily 2 weeks on, 1 week off.  She will be starting Revlimid again on Thursday, December 20th.  She is also on Coumadin for DVT prophylaxis.  She takes Coumadin 6 mg daily with a target INR of around 1.5.  The patient denies any major changes in her condition.  She apparently saw Dr. Barbaraann Boys on November 8th and will be seeing her again in May. She sees her every 6 months.  The patient needs a set of inoculations in late January or early February.  We are going to check with Dr. Carolee Rota office to see whether they want to give these vaccines or whether we should give them.  About 6 months ago, the patient did receive her Pneumovax.  She has also received her flu shot in November.  She is on acyclovir and is due to come off of that as well 1 year post high-dose melphalan.  The patient has some mild back discomfort and that has not changed.  She also has mild peripheral neuropathy which is improved. All in all, Ms. Dumler is doing quite well.  PROBLEM LIST:  Reads as follows. 1. Multiple myeloma diagnosed in August 2010.  She has kappa light     chain disease.  Her urine showed IgG in kappa light chains.  Bone     marrow at that time showed 50% plasma cells.  Metastatic bone     survey carried out on March 30, 2009, was negative.  The patient     was initially treated with Velcade, Decadron, and Doxil for 4-1/2     cycles from 04/24/2009 through 07/27/2009.  She developed     peripheral  neuropathy.  Next bone marrow carried out on 08/03/2009     showed 30% plasma cells.  The patient was then treated with     Revlimid and Decadron from 09/01/2009 through December 2011.  Bone     marrow on March 01, 2010, showed 5% plasma cells.  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  She has had excellent response to that     treatment.  Since June 2012, she has been receiving maintenance     Revlimid and Zometa. 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  Those symptoms     developed in April 2011.  The patient is on Neurontin. 3. Hypertension. 4. GERD. 5. Dyslipidemia. 6. Glaucoma.  MEDICINES:  Medicines are reviewed and recorded.  The patient is continued on: 1. Acyclovir 800 mg twice a day.  She will be finishing up with this     in late January. 2. Allopurinol 300 mg daily. 3. Vitamin B12. 4. Neurontin 600 mg twice a day. 5. Xalatan eyedrops. 6. Revlimid 10 mg daily, now 2 weeks, on 1 week off. 7. Ativan as needed. 8. OxyIR 5 mg as needed. 9. Protonix. 10.K-Dur. 11.Compazine as needed. 12.Timolol eye drops. 13.Vitamin D.  14.Coumadin 6 mg daily.  PHYSICAL EXAMINATION:  General:  The patient looks well.  Vital Signs: Weight is 170 pounds, height 5 feet 4-1/2 inches, body surface area 1.87 sq m.  Blood pressure 135/81 in the right arm sitting.  Other vital signs are normal.  HEENT:  There is no scleral icterus.  Mouth and pharynx are benign.  Lymph:  There is no peripheral adenopathy palpable. Heart/Lungs:  Normal.  Breasts:  Not examined.  The patient has regular mammograms due next year.  Abdomen:  Somewhat obese, nontender with no organomegaly or masses palpable.  Extremities:  No peripheral edema, clubbing, or calf tenderness.  Back:  No skeletal tenderness.  The patient does not have a Port-A-Cath or central catheter.  Neurologic Exam:  Normal.  LABORATORY DATA:  Today white count 3.9,  ANC 1.7, hemoglobin 11.4, hematocrit 34.2, platelets 193,000.  Pro-time 18.0 with an INR of 1.50. Chemistries today are pending, along with uric acid, urine for light chains, and urine protein to creatinine ratio.  Chemistries from 02/19/2011 were normal.  It will be recalled that recent serum immunofixation electrophoresis showed no monoclonal protein.  The last urine immunoelectrophoresis carried out in July and did show monoclonal free kappa light chains.  Total urine protein was 125 mg.  It will be recalled that back in December 2010, a 24-hour urine protein was over 8 g.  The patient's last metastatic bone survey was on 02/07/2011 and was negative.  I believe this was carried out at [facility].  IMPRESSION AND PLAN:  Ms. Aguirre continues to do extraordinarily well, now over 2 years from the time of diagnosis.  She is going to receive Zometa today 4 mg IV.  She will be resuming her Revlimid 10 mg daily, 2 weeks on, 1 week off.  Of note is the fact that her white count and ANC are a little low today.  We will plan to see her again on or about January 15th, at which time we will check CBC, chemistries, uric acid, pro-time, urine immunofixation electrophoresis, and urine for protein to creatinine ratio.  The patient will need her vaccines given 1 year post transplant.  She will be able to stop her acyclovir.  She will be seeing Dr. Barbaraann Boys again in May 2013.    ______________________________ Samul Dada, [name] DSM/MEDQ  D:  03/19/2011  T:  03/19/2011  Job:  [id number]

## 2011-03-19 NOTE — Progress Notes (Signed)
IV started in left posterior  forearm with 22g x 1 in. Angiocath.  First attempt with good blood return;  Blood drawn for labs,  IVF initiated for zometa today.

## 2011-03-19 NOTE — Telephone Encounter (Signed)
gve the pt her dec,jan 2013 appt calendar °

## 2011-03-19 NOTE — Progress Notes (Signed)
This office note has been dictated.  #[id number]

## 2011-03-20 LAB — COMPREHENSIVE METABOLIC PANEL
ALT: 18 U/L (ref 0–35)
AST: 19 U/L (ref 0–37)
Albumin: 4.3 g/dL (ref 3.5–5.2)
Alkaline Phosphatase: 64 U/L (ref 39–117)
BUN: 14 mg/dL (ref 6–23)
CO2: 25 mEq/L (ref 19–32)
Calcium: 9.5 mg/dL (ref 8.4–10.5)
Chloride: 108 mEq/L (ref 96–112)
Creatinine, Ser: 0.91 mg/dL (ref 0.50–1.10)
Glucose, Bld: 115 mg/dL — ABNORMAL HIGH (ref 70–99)
Potassium: 4.1 mEq/L (ref 3.5–5.3)
Sodium: 141 mEq/L (ref 135–145)
Total Bilirubin: 0.3 mg/dL (ref 0.3–1.2)
Total Protein: 7 g/dL (ref 6.0–8.3)

## 2011-03-20 LAB — KAPPA/LAMBDA LIGHT CHAINS
Kappa free light chain: 5.76 mg/dL — ABNORMAL HIGH (ref 0.33–1.94)
Kappa:Lambda Ratio: 3.51 — ABNORMAL HIGH (ref 0.26–1.65)
Lambda Free Lght Chn: 1.64 mg/dL (ref 0.57–2.63)

## 2011-03-20 LAB — URIC ACID: Uric Acid, Serum: 2.4 mg/dL (ref 2.4–7.0)

## 2011-03-20 LAB — LACTATE DEHYDROGENASE: LDH: 194 U/L (ref 94–250)

## 2011-03-21 ENCOUNTER — Other Ambulatory Visit: Payer: Medicare Other | Admitting: Lab

## 2011-03-28 ENCOUNTER — Other Ambulatory Visit (HOSPITAL_BASED_OUTPATIENT_CLINIC_OR_DEPARTMENT_OTHER): Payer: Medicare Other | Admitting: Lab

## 2011-03-28 ENCOUNTER — Other Ambulatory Visit (HOSPITAL_COMMUNITY): Payer: Self-pay | Admitting: Oncology

## 2011-03-28 DIAGNOSIS — C9000 Multiple myeloma not having achieved remission: Secondary | ICD-10-CM

## 2011-03-28 DIAGNOSIS — Z7901 Long term (current) use of anticoagulants: Secondary | ICD-10-CM

## 2011-03-28 DIAGNOSIS — D649 Anemia, unspecified: Secondary | ICD-10-CM

## 2011-03-28 LAB — CBC WITH DIFFERENTIAL/PLATELET
BASO%: 0.5 % (ref 0.0–2.0)
Basophils Absolute: 0 10*3/uL (ref 0.0–0.1)
EOS%: 1.6 % (ref 0.0–7.0)
Eosinophils Absolute: 0.1 10*3/uL (ref 0.0–0.5)
HCT: 36.7 % (ref 34.8–46.6)
HGB: 11.7 g/dL (ref 11.6–15.9)
LYMPH%: 34.6 % (ref 14.0–49.7)
MCH: 30.9 pg (ref 25.1–34.0)
MCHC: 31.9 g/dL (ref 31.5–36.0)
MCV: 96.8 fL (ref 79.5–101.0)
MONO#: 0.4 10*3/uL (ref 0.1–0.9)
MONO%: 7 % (ref 0.0–14.0)
NEUT#: 3.1 10*3/uL (ref 1.5–6.5)
NEUT%: 56.3 % (ref 38.4–76.8)
Platelets: 213 10*3/uL (ref 145–400)
RBC: 3.79 10*6/uL (ref 3.70–5.45)
RDW: 14.8 % — ABNORMAL HIGH (ref 11.2–14.5)
WBC: 5.6 10*3/uL (ref 3.9–10.3)
lymph#: 1.9 10*3/uL (ref 0.9–3.3)
nRBC: 0 % (ref 0–0)

## 2011-04-11 ENCOUNTER — Other Ambulatory Visit: Payer: Self-pay | Admitting: *Deleted

## 2011-04-11 DIAGNOSIS — C9000 Multiple myeloma not having achieved remission: Secondary | ICD-10-CM

## 2011-04-11 MED ORDER — LENALIDOMIDE 10 MG PO CAPS: 10.0000 mg | ORAL_CAPSULE | Freq: Every day | ORAL | Status: DC

## 2011-04-12 ENCOUNTER — Encounter: Payer: Self-pay | Admitting: *Deleted

## 2011-04-12 NOTE — Progress Notes (Signed)
RECEIVED TWO FAXES FROM [facility] CONCERNING CONFIRMATION OF PRESCRIPTION RECEIPT AND NOTICE OF SHIPMENT DELIVERY. THESE FAXES WERE GIVEN TO DR.MURINSON'S NURSE, KATHY BUYCK,RN.

## 2011-04-16 ENCOUNTER — Ambulatory Visit (HOSPITAL_BASED_OUTPATIENT_CLINIC_OR_DEPARTMENT_OTHER): Payer: Medicare Other | Admitting: Oncology

## 2011-04-16 ENCOUNTER — Other Ambulatory Visit (HOSPITAL_BASED_OUTPATIENT_CLINIC_OR_DEPARTMENT_OTHER): Payer: Medicare Other | Admitting: Lab

## 2011-04-16 ENCOUNTER — Ambulatory Visit (HOSPITAL_BASED_OUTPATIENT_CLINIC_OR_DEPARTMENT_OTHER): Payer: Medicare Other

## 2011-04-16 ENCOUNTER — Encounter: Payer: Self-pay | Admitting: Oncology

## 2011-04-16 ENCOUNTER — Telehealth: Payer: Self-pay | Admitting: Oncology

## 2011-04-16 DIAGNOSIS — C9000 Multiple myeloma not having achieved remission: Secondary | ICD-10-CM

## 2011-04-16 DIAGNOSIS — G620 Drug-induced polyneuropathy: Secondary | ICD-10-CM

## 2011-04-16 DIAGNOSIS — T451X5A Adverse effect of antineoplastic and immunosuppressive drugs, initial encounter: Secondary | ICD-10-CM

## 2011-04-16 DIAGNOSIS — Z7901 Long term (current) use of anticoagulants: Secondary | ICD-10-CM

## 2011-04-16 DIAGNOSIS — I10 Essential (primary) hypertension: Secondary | ICD-10-CM

## 2011-04-16 LAB — COMPREHENSIVE METABOLIC PANEL
ALT: 17 U/L (ref 0–35)
AST: 17 U/L (ref 0–37)
Albumin: 4.3 g/dL (ref 3.5–5.2)
Alkaline Phosphatase: 61 U/L (ref 39–117)
BUN: 12 mg/dL (ref 6–23)
CO2: 26 mEq/L (ref 19–32)
Calcium: 9.1 mg/dL (ref 8.4–10.5)
Chloride: 106 mEq/L (ref 96–112)
Creatinine, Ser: 0.71 mg/dL (ref 0.50–1.10)
Glucose, Bld: 81 mg/dL (ref 70–99)
Potassium: 3.9 mEq/L (ref 3.5–5.3)
Sodium: 141 mEq/L (ref 135–145)
Total Bilirubin: 0.3 mg/dL (ref 0.3–1.2)
Total Protein: 7 g/dL (ref 6.0–8.3)

## 2011-04-16 LAB — CBC WITH DIFFERENTIAL/PLATELET
BASO%: 0.8 % (ref 0.0–2.0)
Basophils Absolute: 0 10*3/uL (ref 0.0–0.1)
EOS%: 4.1 % (ref 0.0–7.0)
Eosinophils Absolute: 0.2 10*3/uL (ref 0.0–0.5)
HCT: 35.4 % (ref 34.8–46.6)
HGB: 11.3 g/dL — ABNORMAL LOW (ref 11.6–15.9)
LYMPH%: 46.5 % (ref 14.0–49.7)
MCH: 30.9 pg (ref 25.1–34.0)
MCHC: 31.9 g/dL (ref 31.5–36.0)
MCV: 96.7 fL (ref 79.5–101.0)
MONO#: 0.3 10*3/uL (ref 0.1–0.9)
MONO%: 6.7 % (ref 0.0–14.0)
NEUT#: 1.6 10*3/uL (ref 1.5–6.5)
NEUT%: 41.9 % (ref 38.4–76.8)
Platelets: 259 10*3/uL (ref 145–400)
RBC: 3.66 10*6/uL — ABNORMAL LOW (ref 3.70–5.45)
RDW: 14.6 % — ABNORMAL HIGH (ref 11.2–14.5)
WBC: 3.9 10*3/uL (ref 3.9–10.3)
lymph#: 1.8 10*3/uL (ref 0.9–3.3)
nRBC: 0 % (ref 0–0)

## 2011-04-16 LAB — PROTEIN / CREATININE RATIO, URINE
Creatinine, Urine: 180.9 mg/dL
Protein Creatinine Ratio: 0.06 (ref <0.15)
Total Protein, Urine: 10 mg/dL

## 2011-04-16 LAB — PROTIME-INR
INR: 1.2 — ABNORMAL LOW (ref 2.00–3.50)
Protime: 14.4 Seconds — ABNORMAL HIGH (ref 10.6–13.4)

## 2011-04-16 LAB — URIC ACID: Uric Acid, Serum: 2.4 mg/dL (ref 2.4–7.0)

## 2011-04-16 LAB — LACTATE DEHYDROGENASE: LDH: 190 U/L (ref 94–250)

## 2011-04-16 MED ORDER — ZOLEDRONIC ACID 4 MG/100ML IV SOLN
4.0000 mg | Freq: Once | INTRAVENOUS | Status: AC
  Administered 2011-04-16: 4 mg via INTRAVENOUS
  Filled 2011-04-16: qty 100

## 2011-04-16 MED ORDER — SODIUM CHLORIDE 0.9 % IV SOLN
Freq: Once | INTRAVENOUS | Status: AC
  Administered 2011-04-16: 14:00:00 via INTRAVENOUS

## 2011-04-16 NOTE — Progress Notes (Signed)
CC:   [name], MD [name], [name] [name], MD  HISTORY:  I saw Carmen Fisher today for followup of her kappa light chain multiple myeloma diagnosed in December 2010.  Carmen Fisher was last seen by [place] on 03/19/2011.  Her major problem at this time is persistent upper respiratory infection, possibly bronchitis.  She was given Zithromax by Dr. [name].  She took this about a week ago.  Her respiratory symptoms seem to be lingering on.  She has had no recent fever.  She had a chest x-ray in his office recently and apparently she does not have pneumonia. She is slowly improving.  Carmen Fisher is being maintained on Revlimid 10 mg daily, 2 weeks on and 1 week off.  She will be finishing this recent course today and resuming treatment on Wednesday, January 23rd.  She also has been receiving Zometa 4 mg IV.  These treatments were started in late June.  The patient is on Coumadin 6 mg daily for DVT prophylaxis.  The patient denies any changes in her condition.  She saw Dr. Barbaraann Boys on November 8 and will be seeing her again in early May.  She sees Dr. Barbaraann Boys every 6 months.  The patient needs a set of inoculations to be given 1 year after her high-dose melphalan and autologous stem cell rescue that was given in late January of 2012.  She may be needing to get some of her vaccines at the health department.  I recommended that Carmen Fisher get full details on the vaccinations she will be receiving so that we will have an accurate list and be sure that nothing has been missed.  There are no problems related to her underlying multiple myeloma.  The patient still has mild peripheral neuropathy which has improved from several months ago but no recent changes.  All things considered, Carmen Fisher is doing well.  PROBLEM LIST: 1. Multiple myeloma diagnosed in August 2010.  She has kappa light     chain disease.  In addition, she was found to have monoclonal IgG     as well in her urine  back at the time of diagnosis.  Bone marrow     initially showed 50% plasma cells.  Metastatic bone survey carried     out on March 30, 2009 was negative.  Carmen Fisher was initially     treated with Velcade, Decadron and Doxil for 4-1/2 cycles from     04/24/2009 through 07/27/2009.  She developed peripheral     neuropathy.  Another bone marrow was carried out on 08/03/2009,     showed 30% plasma cells.  Revlimid and Decadron were given from     09/01/2009 through December 2011.  Bone marrow on March 01, 2010     showed 5% plasma cells.  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  She     has had an excellent response to that treatment.  Since June 2012,     she has been receiving maintenance Revlimid and Zometa.  Serum     immunofixation electrophoresis from 10/26/2010 was negative.  Urine     immunofixation electrophoresis from July 2012 continues to show     monoclonal free kappa light chains.  Total urine protein was 125 mg     at that time.  Back at the time of diagnosis in December 2010, the     24-hour urine protein was over 8  g.  The patient's most recent     metastatic bone survey carried out at [facility] on 02/07/2011 was     negative. 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  Symptoms     developed in April 2011.  The patient is currently on Neurontin. 3. Hypertension. 4. GERD. 5. Dyslipidemia. 6. Glaucoma.  MEDICATIONS: 1. Acyclovir 800 mg twice a day.  The patient will be able to     discontinue this in a couple weeks. 2. Allopurinol 300 mg daily. 3. Vitamin B12 3000 mcg sublingually every day. 4. Neurontin 600 mg twice a day. 5. Xalatan 0.005% ophthalmic solution 1 drop at bedtime. 6. Revlimid 10 mg daily, 2 weeks on, 1 week off. 7. Ativan 1 mg every 4 hours as needed. 8. OxyIR 5 mg as needed. 9. Protonix 40 mg daily. 10.K-Dur 20 mEq daily. 11.Compazine 10 mg every 6 hours as needed. 12.Timolol  0.5% ophthalmic solution 1 drop twice a day. 13.Vitamin D. 14.Ergocalciferol 1000 units daily. 15.Coumadin 6 mg daily.  The patient received flu shot on February 12, 2011.  Pneumovax was given in July 2012.  PHYSICAL EXAM:  General:  [name] looks well.  She has a troublesome cough and sounds congested.  Cough is nonproductive.  Vital signs: Weight is 168 pounds 14.4 ounces, height 5 feet 4.5 inches, body surface area 1.87 meter squared.  Blood pressure 140/75.  She is afebrile. Other vital signs are normal.  HEENT:  There is no scleral icterus. Mouth and pharynx are benign.  No peripheral adenopathy palpable.  No areas of musculoskeletal tenderness.  Lungs:  Clear but breath sounds were somewhat harsh.  Cardiac:  Regular rhythm without murmur or rub. Breasts:  Not examined.  The last record of mammograms that we have was from 12/23/2008.  The patient has told me in the past that she is due for mammograms next year.  Abdomen:  Somewhat obese, nontender with no organomegaly or masses palpable.  Extremities:  No peripheral edema, clubbing, calf tenderness.  The patient does not have a Port-A-Cath or central catheter.  Neurologic:  Exam is grossly normal.  LABORATORY DATA:  Today, white count 3.9, ANC 1.6, hemoglobin 11.3, hematocrit 35.4, platelets 259,000.  The patient will take Revlimid today and go 1-week break.  Prothrombin time 14.4 seconds with an INR of 1.20.  On 12/18, prothrombin time was 18.0 with an INR of 1.50.  It is possible that antibiotics may have lowered the patient's prothrombin time and INR.  Chemistries today are pending.  Chemistries from 03/19/2011 were normal except for a glucose of 115.  Albumin 4.3, uric acid 2.4, LDH 194.  Serum kappa lambda ratio on 12/18 was 3.51.  Urine protein to creatinine ratio was 0.04.  Pending today are a urine immunofixation electrophoresis and a urine for protein to creatinine ratio.  IMAGING STUDIES:  Metastatic bone survey  was carried out at [facility] on 02/07/2011.  This was negative.  The report has been scanned into the Epic system.  Chest x-ray, 2 view, from 03/21/2010 showed a tortuous aorta and scoliosis, otherwise negative.  IMPRESSION AND PLAN:  Carmen Fisher continues to do extremely well now over 2 years from the time of diagnosis, and 1 year from the time of her high- dose therapy and autologous stem cell rescue.  She will receive Zometa today 4 mg IV.  We will continue with the Revlimid 10 mg daily, 2 weeks on 1 week off.  The patient will be resuming her Revlimid on  or about Wednesday, January 23rd.  We would like to check a prothrombin time in 2 weeks, and I will plan to see Carmen Fisher again in 4 weeks at which time she will be due for another dose of Zometa 4 mg IV.  We will check CBC, chemistries, and a prothrombin time in 4 weeks.  The patient will be able to discontinue her acyclovir.  It will be important for [place] to be sure that she receives her vaccines that are to be given 1-year post transplant.  Carmen Fisher will be seeing Dr. Barbaraann Boys again in early May.    ______________________________ Samul Dada, [name] DSM/MEDQ  D:  04/16/2011  T:  04/16/2011  Job:  [id number]

## 2011-04-16 NOTE — Progress Notes (Signed)
This office note has been dictated.  #[id number]

## 2011-04-16 NOTE — Patient Instructions (Signed)
Patient ambulatory out of clinic without complaints.  Instructed patient to call with any issues.  Patient aware of next appointment

## 2011-04-16 NOTE — Telephone Encounter (Signed)
Gv pt appt for jan-feb2013 °

## 2011-04-23 ENCOUNTER — Telehealth: Payer: Self-pay | Admitting: Oncology

## 2011-04-23 LAB — UIFE/LIGHT CHAINS/TP QN, 24-HR UR
Albumin, U: DETECTED
Alpha 1, Urine: DETECTED — AB
Alpha 2, Urine: DETECTED — AB
Beta, Urine: DETECTED — AB
Free Kappa Lt Chains,Ur: 4.5 mg/dL — ABNORMAL HIGH (ref 0.14–2.42)
Free Kappa/Lambda Ratio: 20.45 ratio — ABNORMAL HIGH (ref 2.04–10.37)
Free Lambda Excretion/Day: 5.28 mg/d
Free Lambda Lt Chains,Ur: 0.22 mg/dL (ref 0.02–0.67)
Free Lt Chn Excr Rate: 108 mg/d
Gamma Globulin, Urine: DETECTED — AB
Time: 24 hours
Total Protein, Urine-Ur/day: 125 mg/d (ref 10–140)
Total Protein, Urine: 5.2 mg/dL
Volume, Urine: 2400 mL

## 2011-04-23 NOTE — Telephone Encounter (Signed)
pt had called and l/m to combine 2/21 and 2/22 appts,done,combined on 2/21,pt aware  aom

## 2011-04-30 ENCOUNTER — Other Ambulatory Visit (HOSPITAL_BASED_OUTPATIENT_CLINIC_OR_DEPARTMENT_OTHER): Payer: Medicare Other | Admitting: Lab

## 2011-04-30 DIAGNOSIS — Z7901 Long term (current) use of anticoagulants: Secondary | ICD-10-CM

## 2011-04-30 DIAGNOSIS — C9000 Multiple myeloma not having achieved remission: Secondary | ICD-10-CM

## 2011-04-30 LAB — BASIC METABOLIC PANEL
BUN: 15 mg/dL (ref 6–23)
CO2: 27 mEq/L (ref 19–32)
Calcium: 9.5 mg/dL (ref 8.4–10.5)
Chloride: 106 mEq/L (ref 96–112)
Creatinine, Ser: 0.89 mg/dL (ref 0.50–1.10)
Glucose, Bld: 97 mg/dL (ref 70–99)
Potassium: 4 mEq/L (ref 3.5–5.3)
Sodium: 141 mEq/L (ref 135–145)

## 2011-04-30 LAB — PROTIME-INR
INR: 1.3 — ABNORMAL LOW (ref 2.00–3.50)
Protime: 15.6 Seconds — ABNORMAL HIGH (ref 10.6–13.4)

## 2011-05-07 ENCOUNTER — Other Ambulatory Visit: Payer: Self-pay | Admitting: *Deleted

## 2011-05-07 DIAGNOSIS — C9000 Multiple myeloma not having achieved remission: Secondary | ICD-10-CM

## 2011-05-07 MED ORDER — GABAPENTIN 600 MG PO TABS: 600.0000 mg | ORAL_TABLET | Freq: Two times a day (BID) | ORAL | Status: DC

## 2011-05-07 NOTE — Telephone Encounter (Signed)
THIS REQUEST WAS GIVEN TO DR.MURINSON'S NURSE, ROBIN BASS,RN. 

## 2011-05-08 ENCOUNTER — Other Ambulatory Visit: Payer: Self-pay | Admitting: Medical Oncology

## 2011-05-08 DIAGNOSIS — C9000 Multiple myeloma not having achieved remission: Secondary | ICD-10-CM

## 2011-05-08 MED ORDER — LENALIDOMIDE 10 MG PO CAPS: 10.0000 mg | ORAL_CAPSULE | Freq: Every day | ORAL | Status: DC

## 2011-05-08 NOTE — Telephone Encounter (Signed)
Revlimid authorization number [id number]. Prescription faxed to [facility].

## 2011-05-09 ENCOUNTER — Encounter: Payer: Self-pay | Admitting: *Deleted

## 2011-05-09 ENCOUNTER — Telehealth: Payer: Self-pay | Admitting: Medical Oncology

## 2011-05-09 NOTE — Progress Notes (Signed)
RECEIVED A FAX FROM [facility] CONCERNING PT.'S BENEFITS AND REVLIMID.

## 2011-05-09 NOTE — Telephone Encounter (Signed)
Pt's revlimid was changed from daily to 14 days on then a 7 days rest. She currently has a 28 day supply. I asked her when she gets down to the last week to call and we can reorder. She voiced understanding.

## 2011-05-16 ENCOUNTER — Other Ambulatory Visit (HOSPITAL_COMMUNITY): Payer: Self-pay | Admitting: Oncology

## 2011-05-16 ENCOUNTER — Telehealth: Payer: Self-pay

## 2011-05-16 DIAGNOSIS — C9000 Multiple myeloma not having achieved remission: Secondary | ICD-10-CM

## 2011-05-16 NOTE — Telephone Encounter (Signed)
Pt called asking which medicine she can quit. According to Dr Murinson's note it is acyclovir. I confirmed w/pt that she did [facility] post [facility].

## 2011-05-17 ENCOUNTER — Other Ambulatory Visit (HOSPITAL_COMMUNITY): Payer: Self-pay | Admitting: Oncology

## 2011-05-23 ENCOUNTER — Telehealth: Payer: Self-pay | Admitting: Oncology

## 2011-05-23 ENCOUNTER — Telehealth: Payer: Self-pay | Admitting: Medical Oncology

## 2011-05-23 ENCOUNTER — Ambulatory Visit (HOSPITAL_BASED_OUTPATIENT_CLINIC_OR_DEPARTMENT_OTHER): Payer: Medicare Other

## 2011-05-23 ENCOUNTER — Other Ambulatory Visit: Payer: Medicare Other | Admitting: Lab

## 2011-05-23 ENCOUNTER — Ambulatory Visit (HOSPITAL_BASED_OUTPATIENT_CLINIC_OR_DEPARTMENT_OTHER): Payer: Medicare Other | Admitting: Physician Assistant

## 2011-05-23 DIAGNOSIS — C9000 Multiple myeloma not having achieved remission: Secondary | ICD-10-CM

## 2011-05-23 DIAGNOSIS — Z7901 Long term (current) use of anticoagulants: Secondary | ICD-10-CM

## 2011-05-23 LAB — COMPREHENSIVE METABOLIC PANEL
ALT: 21 U/L (ref 0–35)
AST: 21 U/L (ref 0–37)
Albumin: 4.1 g/dL (ref 3.5–5.2)
Alkaline Phosphatase: 67 U/L (ref 39–117)
BUN: 15 mg/dL (ref 6–23)
CO2: 24 mEq/L (ref 19–32)
Calcium: 9.5 mg/dL (ref 8.4–10.5)
Chloride: 108 mEq/L (ref 96–112)
Creatinine, Ser: 0.83 mg/dL (ref 0.50–1.10)
Glucose, Bld: 97 mg/dL (ref 70–99)
Potassium: 4.2 mEq/L (ref 3.5–5.3)
Sodium: 141 mEq/L (ref 135–145)
Total Bilirubin: 0.3 mg/dL (ref 0.3–1.2)
Total Protein: 6.8 g/dL (ref 6.0–8.3)

## 2011-05-23 LAB — CBC WITH DIFFERENTIAL/PLATELET
BASO%: 0.6 % (ref 0.0–2.0)
Basophils Absolute: 0 10*3/uL (ref 0.0–0.1)
EOS%: 0.8 % (ref 0.0–7.0)
Eosinophils Absolute: 0 10*3/uL (ref 0.0–0.5)
HCT: 35.1 % (ref 34.8–46.6)
HGB: 11.3 g/dL — ABNORMAL LOW (ref 11.6–15.9)
LYMPH%: 48.6 % (ref 14.0–49.7)
MCH: 31.1 pg (ref 25.1–34.0)
MCHC: 32.2 g/dL (ref 31.5–36.0)
MCV: 96.7 fL (ref 79.5–101.0)
MONO#: 0.5 10*3/uL (ref 0.1–0.9)
MONO%: 10.8 % (ref 0.0–14.0)
NEUT#: 2 10*3/uL (ref 1.5–6.5)
NEUT%: 39.2 % (ref 38.4–76.8)
Platelets: 176 10*3/uL (ref 145–400)
RBC: 3.63 10*6/uL — ABNORMAL LOW (ref 3.70–5.45)
RDW: 14.8 % — ABNORMAL HIGH (ref 11.2–14.5)
WBC: 5 10*3/uL (ref 3.9–10.3)
lymph#: 2.4 10*3/uL (ref 0.9–3.3)
nRBC: 0 % (ref 0–0)

## 2011-05-23 LAB — PROTIME-INR
INR: 1.5 — ABNORMAL LOW (ref 2.00–3.50)
Protime: 18 Seconds — ABNORMAL HIGH (ref 10.6–13.4)

## 2011-05-23 LAB — LACTATE DEHYDROGENASE: LDH: 220 U/L (ref 94–250)

## 2011-05-23 MED ORDER — SODIUM CHLORIDE 0.9 % IV SOLN
Freq: Once | INTRAVENOUS | Status: AC
  Administered 2011-05-23: 10:00:00 via INTRAVENOUS

## 2011-05-23 MED ORDER — ZOLEDRONIC ACID 4 MG/100ML IV SOLN
4.0000 mg | Freq: Once | INTRAVENOUS | Status: AC
  Administered 2011-05-23: 4 mg via INTRAVENOUS
  Filled 2011-05-23: qty 100

## 2011-05-23 NOTE — Progress Notes (Signed)
[facility] OFFICE PROGRESS NOTE  Carmen Moh, MD, MD  CC: Carmen Candle, MD  Carmen Fisher. Carmen Fisher, [name]  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 Carmen Fisher was last seen by [place] on 04/16/2011 Carmen Fisher is being maintained now on Revlimid 14 days on then a 7 days rest. She will start new cycle today, 05/23/2011.She also has been receiving Zometa 4 mg IV last dose on 04/16/2011. These treatments were started in late June. The patient is on Coumadin 6 mg daily for DVT prophylaxis. The patient denies any changes in her condition. She saw Dr. Barbaraann Fisher on November 8 and will be seeing her again in early May. She sees Dr. Barbaraann Fisher every 6 months. The patient needs a set of inoculations to be given 1 year after her high-dose melphalan and autologous stem cell rescue that was given in late January of 2012. Patient  Received on 2/3 HepB, Hib, Pneumo, Polio, Td, Tdap/Pertussis, and due on 3/13 for Hep B, Polio and Td. There are no problems related to her underlying multiple myeloma. The patient still has mild peripheral neuropathy which has improved from several months ago but no recent changes. All things considered, Carmen Fisher is doing well.   PROBLEM LIST:   1. Multiple myeloma diagnosed in August 2010. She has kappa light chain disease. In addition, she was found to have monoclonal IgG as well in her urine back at the time of diagnosis. Bone marrow initially showed 50% plasma cells. Metastatic bone survey carried out on March 30, 2009 was negative. Carmen Fisher was initially treated with Velcade, Decadron and Doxil for 4-1/2 cycles from 04/24/2009 through 07/27/2009. She developed peripheral neuropathy. Another bone marrow was carried out on 08/03/2009, showed 30% plasma cells. Revlimid and Decadron were given from 09/01/2009 through December 2011. Bone marrow on March 01, 2010 showed 5%  plasma cells. 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 She has had an excellent response to that treatment. Since June 2012, she has been receiving maintenance Revlimid and Zometa. Serum immunofixation electrophoresis from 10/26/2010 was negative. Urine immunofixation electrophoresis from July 2012 continues to show monoclonal free kappa light chains. Total urine protein was 125 mg at that time. Back at the time of diagnosis in December 2010, the 24-hour urine protein was over 8 g. The patient's most recent metastatic bone survey carried out at [facility] on 02/07/2011 was negative.  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 Symptoms developed in April 2011. The patient is currently on Neurontin.  3. Hypertension.  4. GERD.  5. Dyslipidemia.  6. Glaucoma.    SURGICAL HISTORY:  Past Surgical History  Procedure Date  . Appendectomy   . Abdominal hysterectomy 1994    w/BSO    MEDICATIONS: Current Outpatient Prescriptions  Medication Sig Dispense Refill  . acetaminophen (TYLENOL) 500 MG tablet Take 500 mg by mouth every 6 (six) hours as needed.        Marland Kitchen acyclovir (ZOVIRAX) 800 MG tablet Take 800 mg by mouth 2 (two) times daily.        Marland Kitchen allopurinol (ZYLOPRIM) 300 MG tablet TAKE ONE TABLET BY MOUTH EVERY DAY  30 tablet  0  . Alum & Mag Hydroxide-Simeth (MAGIC MOUTHWASH) SOLN Take 5 mLs by mouth as needed.        . Cyanocobalamin (VITAMIN B-12 SL) Place 3,000 mcg under the tongue  daily.        . gabapentin (NEURONTIN) 600 MG tablet Take 1 tablet (600 mg total) by mouth 2 (two) times daily.  120 tablet  2  . latanoprost (XALATAN) 0.005 % ophthalmic solution 1 drop at bedtime.        Marland Kitchen lenalidomide (REVLIMID) 10 MG capsule Take 1 capsule (10 mg total) by mouth daily. Authorization number [id number]   Adult female not of child bearing potential     ICD-9 Code:203.00  28 capsule  0  . LORazepam (ATIVAN) 1 MG tablet Take 1 mg by mouth every  4 (four) hours as needed.        . pantoprazole (PROTONIX) 40 MG tablet TAKE ONE TABLET BY MOUTH EVERY DAY  30 tablet  3  . Potassium Chloride (K-TABS PO) Take 20 mEq by mouth daily.        . timolol (BETIMOL) 0.5 % ophthalmic solution 1 drop 2 (two) times daily.        Marland Kitchen VITAMIN D, ERGOCALCIFEROL, PO Take 1,000 Units by mouth daily.        Marland Kitchen warfarin (COUMADIN) 6 MG tablet Take 1 tablet (6 mg total) by mouth See admin instructions.  30 tablet  4  . oxyCODONE (OXY IR/ROXICODONE) 5 MG immediate release tablet Take 5 mg by mouth every 6 (six) hours as needed.        . prochlorperazine (COMPAZINE) 10 MG tablet Take 10 mg by mouth every 6 (six) hours as needed.         No current facility-administered medications for this visit.   Facility-Administered Medications Ordered in Other Visits  Medication Dose Route Frequency Provider Last Rate Last Dose  . 0.9 %  sodium chloride infusion   Intravenous Once Carmen Dada, MD      . Zoledronic Acid (ZOMETA) 4 mg IVPB  4 mg Intravenous Once Carmen Dada, MD   4 mg at 05/23/11 0950    ALLERGIES:  is allergic to aspirin and sulfa antibiotics.  REVIEW OF SYSTEMS:  The rest of the 14-point review of system was negative.   Filed Vitals:   05/23/11 0840  BP: 120/78  Pulse: 75  Temp: 98.3 F (36.8 C)   Wt Readings from Last 3 Encounters:  05/23/11 169 lb 4.8 oz (76.794 kg)  04/16/11 168 lb 14.4 oz (76.613 kg)  03/19/11 170 lb 3.2 oz (77.202 kg)   ECOG Performance status:   PHYSICAL EXAMINATION:General: [name] looks well. She is afebrile. Other vital signs are normal. HEENT: There is no scleral icterus. Mouth and pharynx are benign. No peripheral adenopathy palpable. No areas of musculoskeletal tenderness. Lungs: Clear but breath sounds were somewhat harsh. Cardiac: Regular rhythm without murmur or rub. Breasts: Not examined. The last record of mammograms that we have was from 12/23/2008. The patient has told me in the past that she is  due for mammograms this year. Abdomen: Somewhat obese, nontender with no organomegaly or masses palpable. Extremities: No peripheral edema, clubbing, calf tenderness. The patient does not have a Port-A-Cath or central catheter. Neurologic: Exam is grossly normal    LABORATORY/RADIOLOGY DATA:   Lab 05/23/11 0811  WBC 5.0  HGB 11.3*  HCT 35.1  PLT 176  MCV 96.7  MCH 31.1  MCHC 32.2  RDW 14.8*  LYMPHSABS 2.4  MONOABS 0.5  EOSABS 0.0  BASOSABS 0.0  BANDABS --    CMP   No results found for this basename: NA:5,K:5,CL:5,CO2:5,GLUCOSE:5,BUN:5,CREATININE:5,GFRCGP,:5,CALCIUM:5,MG:5,AST:5,ALT:5,ALKPHOS:5,BILITOT:5 in the last 168 hours  Component Value Date/Time   BILITOT 0.3 04/16/2011 1059   BILIDIR 0.1 06/04/2008 1405   IBILI 0.5 06/04/2008 1405     Radiology Studies:  No results found.     ASSESSMENT AND PLAN:  Carmen Fisher continues to do extremely well now over 2 years from the time of diagnosis, and 1 year from the time of her high- dose therapy and autologous stem cell rescue. She will receive Zometa today 4 mg IV. We will continue with the Revlimid 10 mg daily, 2 weeks on 1 week off. The patient will be resuming her Revlimid today 05/23/2011 We would like to check a prothrombin time in 2 weeks.Will plan to see Carmen Fisher again in 4 weeks at which time she will be due for another dose of Zometa 4 mg IV. We will check CBC, chemistries, and a prothrombin time in 4 weeks. In the interim, she will receive the next set of vaccinations at the health department. The patient will be able to discontinue her acyclovir.Her vaccines are up to date now. Carmen Fisher will be seeing Dr. Barbaraann Fisher again in early May.

## 2011-05-23 NOTE — Patient Instructions (Signed)
[facility] Discharge Instructions for Patients Receiving Chemotherapy  Today you received the following non chemotherapy agent Zometa  To help prevent nausea and vomiting after your treatment, we encourage you to take your nausea medication as directed by MD   If you develop nausea and vomiting that is not controlled by your nausea medication, call the clinic. If it is after clinic hours your family physician or the after hours number for the clinic or go to the Emergency Department.   BELOW ARE SYMPTOMS THAT SHOULD BE REPORTED IMMEDIATELY:  *FEVER GREATER THAN 100.5 F  *CHILLS WITH OR WITHOUT FEVER  NAUSEA AND VOMITING THAT IS NOT CONTROLLED WITH YOUR NAUSEA MEDICATION  *UNUSUAL SHORTNESS OF BREATH  *UNUSUAL BRUISING OR BLEEDING  TENDERNESS IN MOUTH AND THROAT WITH OR WITHOUT PRESENCE OF ULCERS  *URINARY PROBLEMS  *BOWEL PROBLEMS  UNUSUAL RASH Items with * indicate a potential emergency and should be followed up as soon as possible.  One of the nurses will contact you 24 hours after your first treatment. Please let the nurse know about any problems that you may have experienced. Feel free to call the clinic you have any questions or concerns. The clinic phone number is [phone].   I have been informed and understand all the instructions given to me. I know to contact the clinic, my physician, or go to the Emergency Department if any problems should occur. I do not have any questions at this time, but understand that I may call the clinic during office hours   should I have any questions or need assistance in obtaining follow up care.    __________________________________________  _____________  __________ Signature of Patient or Authorized Representative            Date                   Time    __________________________________________ Nurse's Signature

## 2011-05-23 NOTE — Telephone Encounter (Signed)
I have attempted to reach pt several times to verify she needs a refill on revilmid. Her dose has been changed by Dr. Barbaraann Boys and she had 28 tabs around the Feb 7 th. She is take 14 days on and 7 days off.I just need to know if she has enough for the next cycle or do we need to reorder at this time. I asked her to call me back.

## 2011-05-23 NOTE — Telephone Encounter (Signed)
Gv pt appt for [id number] 

## 2011-05-24 ENCOUNTER — Ambulatory Visit: Payer: Medicare Other

## 2011-05-24 ENCOUNTER — Telehealth: Payer: Self-pay

## 2011-05-24 ENCOUNTER — Encounter: Payer: Self-pay | Admitting: *Deleted

## 2011-05-24 ENCOUNTER — Other Ambulatory Visit: Payer: Self-pay | Admitting: *Deleted

## 2011-05-24 NOTE — Telephone Encounter (Signed)
S/w pt and she says she has enough Revilid probably through March.

## 2011-05-24 NOTE — Telephone Encounter (Signed)
PT.'S REVLIMID DOSE HAS BEEN DECREASED. SHE WILL NOT NEED MEDICATION UNTIL SOMETIME IN MARCH. NOTIFIED [facility], MARY BETH. SHE VOICES UNDERSTANDING.

## 2011-05-29 ENCOUNTER — Other Ambulatory Visit (HOSPITAL_BASED_OUTPATIENT_CLINIC_OR_DEPARTMENT_OTHER): Payer: Medicare Other | Admitting: Lab

## 2011-05-29 DIAGNOSIS — C9000 Multiple myeloma not having achieved remission: Secondary | ICD-10-CM

## 2011-05-29 LAB — BASIC METABOLIC PANEL
BUN: 16 mg/dL (ref 6–23)
CO2: 28 mEq/L (ref 19–32)
Calcium: 9.1 mg/dL (ref 8.4–10.5)
Chloride: 108 mEq/L (ref 96–112)
Creatinine, Ser: 0.91 mg/dL (ref 0.50–1.10)
Glucose, Bld: 114 mg/dL — ABNORMAL HIGH (ref 70–99)
Potassium: 4.1 mEq/L (ref 3.5–5.3)
Sodium: 142 mEq/L (ref 135–145)

## 2011-06-13 ENCOUNTER — Other Ambulatory Visit: Payer: Self-pay | Admitting: *Deleted

## 2011-06-13 DIAGNOSIS — C9000 Multiple myeloma not having achieved remission: Secondary | ICD-10-CM

## 2011-06-13 MED ORDER — LENALIDOMIDE 10 MG PO CAPS: 10.0000 mg | ORAL_CAPSULE | Freq: Every day | ORAL | Status: DC

## 2011-06-13 NOTE — Telephone Encounter (Signed)
PT. IS ADULT FEMALE NOT OF CHILDBEARING POTENTIAL. AUTHORIZATION #[id number]

## 2011-06-14 ENCOUNTER — Encounter: Payer: Self-pay | Admitting: *Deleted

## 2011-06-14 NOTE — Progress Notes (Signed)
RECEIVED A FAXES FROM [facility] CONCERNING A CONFIRMATION OF PRESCRIPTION BENEFITS AND PRESCRIPTION SHIPMENT FOR REVLIMID.

## 2011-06-28 ENCOUNTER — Encounter: Payer: Self-pay | Admitting: Oncology

## 2011-06-28 ENCOUNTER — Telehealth: Payer: Self-pay | Admitting: Oncology

## 2011-06-28 ENCOUNTER — Other Ambulatory Visit (HOSPITAL_BASED_OUTPATIENT_CLINIC_OR_DEPARTMENT_OTHER): Payer: Medicare Other

## 2011-06-28 ENCOUNTER — Other Ambulatory Visit: Payer: Self-pay | Admitting: Oncology

## 2011-06-28 ENCOUNTER — Ambulatory Visit (HOSPITAL_BASED_OUTPATIENT_CLINIC_OR_DEPARTMENT_OTHER): Payer: Medicare Other

## 2011-06-28 ENCOUNTER — Other Ambulatory Visit: Payer: Self-pay | Admitting: Medical Oncology

## 2011-06-28 ENCOUNTER — Ambulatory Visit: Payer: Medicare Other | Admitting: Oncology

## 2011-06-28 DIAGNOSIS — C9000 Multiple myeloma not having achieved remission: Secondary | ICD-10-CM

## 2011-06-28 DIAGNOSIS — Z7901 Long term (current) use of anticoagulants: Secondary | ICD-10-CM

## 2011-06-28 LAB — CBC WITH DIFFERENTIAL/PLATELET
BASO%: 0.5 % (ref 0.0–2.0)
Basophils Absolute: 0 10*3/uL (ref 0.0–0.1)
EOS%: 1.5 % (ref 0.0–7.0)
Eosinophils Absolute: 0.1 10*3/uL (ref 0.0–0.5)
HCT: 34.7 % — ABNORMAL LOW (ref 34.8–46.6)
HGB: 11.3 g/dL — ABNORMAL LOW (ref 11.6–15.9)
LYMPH%: 37.7 % (ref 14.0–49.7)
MCH: 32.4 pg (ref 25.1–34.0)
MCHC: 32.5 g/dL (ref 31.5–36.0)
MCV: 99.5 fL (ref 79.5–101.0)
MONO#: 0.4 10*3/uL (ref 0.1–0.9)
MONO%: 11.1 % (ref 0.0–14.0)
NEUT#: 1.9 10*3/uL (ref 1.5–6.5)
NEUT%: 49.2 % (ref 38.4–76.8)
Platelets: 163 10*3/uL (ref 145–400)
RBC: 3.49 10*6/uL — ABNORMAL LOW (ref 3.70–5.45)
RDW: 15.7 % — ABNORMAL HIGH (ref 11.2–14.5)
WBC: 3.9 10*3/uL (ref 3.9–10.3)
lymph#: 1.5 10*3/uL (ref 0.9–3.3)
nRBC: 0 % (ref 0–0)

## 2011-06-28 LAB — PROTIME-INR
INR: 1.4 — ABNORMAL LOW (ref 2.00–3.50)
Protime: 16.8 Seconds — ABNORMAL HIGH (ref 10.6–13.4)

## 2011-06-28 MED ORDER — ZOLEDRONIC ACID 4 MG/100ML IV SOLN
4.0000 mg | Freq: Once | INTRAVENOUS | Status: AC
  Administered 2011-06-28: 4 mg via INTRAVENOUS
  Filled 2011-06-28: qty 100

## 2011-06-28 NOTE — Patient Instructions (Signed)
[facility] Discharge Instructions for Patients Receiving Chemotherapy  Today you received the following chemotherapy agents zometa  To help prevent nausea and vomiting after your treatment, we encourage you to take your nausea medication and take it as often as prescribed for the next   If you develop nausea and vomiting that is not controlled by your nausea medication, call the clinic. If it is after clinic hours your family physician or the after hours number for the clinic or go to the Emergency Department.   BELOW ARE SYMPTOMS THAT SHOULD BE REPORTED IMMEDIATELY:  *FEVER GREATER THAN 100.5 F  *CHILLS WITH OR WITHOUT FEVER  NAUSEA AND VOMITING THAT IS NOT CONTROLLED WITH YOUR NAUSEA MEDICATION  *UNUSUAL SHORTNESS OF BREATH  *UNUSUAL BRUISING OR BLEEDING  TENDERNESS IN MOUTH AND THROAT WITH OR WITHOUT PRESENCE OF ULCERS  *URINARY PROBLEMS  *BOWEL PROBLEMS  UNUSUAL RASH Items with * indicate a potential emergency and should be followed up as soon as possible.  One of the nurses will contact you 24 hours after your treatment. Please let the nurse know about any problems that you may have experienced. Feel free to call the clinic you have any questions or concerns. The clinic phone number is [phone].   I have been informed and understand all the instructions given to me. I know to contact the clinic, my physician, or go to the Emergency Department if any problems should occur. I do not have any questions at this time, but understand that I may call the clinic during office hours   should I have any questions or need assistance in obtaining follow up care.    __________________________________________  _____________  __________ Signature of Patient or Authorized Representative            Date                   Time    __________________________________________ Nurse's Signature    

## 2011-06-28 NOTE — Telephone Encounter (Signed)
Gv pt appt for april-[id number] 

## 2011-06-28 NOTE — Progress Notes (Signed)
This office note has been dictated.  #[id number]

## 2011-06-28 NOTE — Progress Notes (Signed)
CC:   Carmen Candle, MD Carmen Fisher. Carmen Fisher, [name] Carmen Feinstein, MD  PROBLEM LIST:  1. Multiple myeloma diagnosed in August 2010. She has kappa light  chain disease. In addition, she was found to have monoclonal IgG  as well in her urine back at the time of diagnosis. Bone marrow  initially showed 50% plasma cells. Metastatic bone survey carried  out on March 30, 2009 was negative. Carmen Fisher was initially  treated with Velcade, Decadron and Doxil for 4-1/2 cycles from  04/24/2009 through 07/27/2009. She developed peripheral  neuropathy. Another bone marrow was carried out on 08/03/2009,  showed 30% plasma cells. Revlimid and Decadron were given from  09/01/2009 through December 2011. Bone marrow on March 01, 2010  showed 5% plasma cells. The patient then underwent high-dose  melphalan with autologous stem cell infusion on 04/26/2010. She  has had an excellent response to that treatment. Since June 2012,  she has been receiving maintenance Revlimid and Zometa. Serum  immunofixation electrophoresis from 10/26/2010 was negative. Urine  immunofixation electrophoresis from July 2012 continues to show  monoclonal free kappa light chains. Total urine protein was 125 mg  at that time. Back at the time of diagnosis in December 2010, the  24-hour urine protein was over 8 g. The patient's most recent  metastatic bone survey carried out at [facility] on 02/07/2011 was  negative.  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 Symptoms  developed in April 2011. The patient is currently on Neurontin.  3. Hypertension.  4. GERD.  5. Dyslipidemia.  6. Glaucoma.   MEDICATIONS:  1. Zometa 4 mg IV given monthly.  This was started on 09/28/2010. 2. Allopurinol 300 mg daily.  3. Vitamin B12 3000 mcg sublingually every day.  4. Neurontin 600 mg twice a day.  5. Xalatan 0.005% ophthalmic solution 1 drop at bedtime.  6. Revlimid 10 mg daily, 2 weeks on, 1 week off.  7. Ativan 1 mg every 4 hours  as needed.  8. OxyIR 5 mg as needed.  9. Protonix 40 mg daily.  10.K-Dur 20 mEq daily.  11.Compazine 10 mg every 6 hours as needed.  12.Timolol 0.5% ophthalmic solution 1 drop twice a day.  13.Vitamin D.  14.Ergocalciferol 1000 units daily.  15.Coumadin 6 mg daily.    HISTORY:  Carmen Fisher was seen today for followup of her kappa light chain multiple myeloma diagnosed in December 2010.  Carmen Fisher was last seen by [place] on 05/23/2011 and prior to that on 04/16/2011.  Her condition remains excellent.  Her only real problem is continued peripheral neuropathy which we attribute to the Velcade.  This seems to be relatively stable and markedly improved from 1-2 years ago.  Carmen Fisher continues on Revlimid 10 mg daily for 2 weeks and then 1 week off thus a 3 week cycle.  She tells me that she took her last dose of this cycle just yesterday.  She will be off for a week.  She was started on Zometa on 09/28/2010.  Has been receiving this every 4 weeks in a dose of 4 mg. She tolerates this well.  She is also on Coumadin 6 mg daily as prophylaxis.  We try to maintain her INR around 1.5.  Carmen Fisher is due to see Dr. Barbaraann Boys in early May.  There are no problems related to her ongoing therapy or underlying myeloma at this time.  PHYSICAL EXAMINATION:  The patient looks well.  Weight is stable at 170.8 pounds.  Height 5  feet 4-1/2 inches, body surface area 1.88 meters squared.  Blood pressure 118/67.  Other vital signs are normal.  There is no scleral icterus.  Mouth and pharynx are benign.  No peripheral adenopathy palpable.  Heart and lungs are normal.  Breasts are not examined.  The patient states that she has regular mammograms.  The last record that we have of a mammogram was on 12/23/2008.  We have mentioned this to her previously.  Abdomen is somewhat obese, nontender with no organomegaly or masses palpable.  No skeletal tenderness over the spine. Extremities:  No peripheral edema,  clubbing, calf tenderness. Neurologic exam is grossly normal.  The patient does not have a Port-A- Cath or central catheter.  LABORATORY DATA:  Today, white count 3.9, ANC 1.9, hemoglobin 11.3, hematocrit 34.7, platelets 163,000.  Pro-time today was 16.8 with an INR of 1.40.  Chemistries and kappa lambda light chains are pending. Chemistries from 05/23/2011 were normal.  Albumin was 4.1, LDH 220.  On 04/19/2011 the free kappa lambda ratio was 20.45 with normal being 2.04 to 10.37.  On 04/19/2011 the total protein excretion for 24 hours was 125 mg with normal being 10 to 140 mg per 24 hours.  IMAGING STUDIES: 1. Metastatic bone survey was carried out at [facility] on 02/07/2011.  This     was negative.  The report has been scanned into the Epic system. 2. Chest x-ray, 2 view, from 03/21/2010 showed a tortuous aorta and     scoliosis, otherwise negative.  IMPRESSION AND PLAN:  Carmen Fisher continues to do extraordinarily well, now over 2 years from the time of diagnosis and over 1 year from the time of her high-dose melphalan and autologous stem cell infusion.  Carmen Fisher is due for Zometa 4 mg IV today.  We will continue this every 4 weeks as suggested by Dr. Barbaraann Boys.  We will plan to check CBC and pro- time on 04/26, at which time the patient will be due for Zometa 4 mg IV.  Will plan to see her again in 2 months which will be around May 24, at which time we will check CBC, chemistries, pro-time and a urine protein to creatinine ratio.  We will also obtain a urine immunofixation electrophoresis.  The patient will be due for Zometa.  Carmen Fisher will continue with Revlimid 10 mg 2 weeks on and 1 week off. She will be restarting her Revlimid in approximately 1 week.  She will continue with Coumadin 6 mg daily.  She is now off of her acyclovir.  As stated, she will be seeing Dr. Barbaraann Boys in early May.    ______________________________ Samul Dada, [name] DSM/MEDQ  D:  06/28/2011  T:   06/28/2011  Job:  [id number]

## 2011-07-01 LAB — KAPPA/LAMBDA LIGHT CHAINS
Kappa free light chain: 5.07 mg/dL — ABNORMAL HIGH (ref 0.33–1.94)
Kappa:Lambda Ratio: 3 — ABNORMAL HIGH (ref 0.26–1.65)
Lambda Free Lght Chn: 1.69 mg/dL (ref 0.57–2.63)

## 2011-07-01 LAB — COMPREHENSIVE METABOLIC PANEL
ALT: 15 U/L (ref 0–35)
AST: 17 U/L (ref 0–37)
Albumin: 4 g/dL (ref 3.5–5.2)
Alkaline Phosphatase: 57 U/L (ref 39–117)
BUN: 11 mg/dL (ref 6–23)
CO2: 24 mEq/L (ref 19–32)
Calcium: 8.7 mg/dL (ref 8.4–10.5)
Chloride: 111 mEq/L (ref 96–112)
Creatinine, Ser: 1.1 mg/dL (ref 0.50–1.10)
Glucose, Bld: 120 mg/dL — ABNORMAL HIGH (ref 70–99)
Potassium: 3.8 mEq/L (ref 3.5–5.3)
Sodium: 143 mEq/L (ref 135–145)
Total Bilirubin: 0.3 mg/dL (ref 0.3–1.2)
Total Protein: 6.6 g/dL (ref 6.0–8.3)

## 2011-07-10 ENCOUNTER — Other Ambulatory Visit: Payer: Self-pay | Admitting: *Deleted

## 2011-07-10 DIAGNOSIS — C9000 Multiple myeloma not having achieved remission: Secondary | ICD-10-CM

## 2011-07-10 MED ORDER — LENALIDOMIDE 10 MG PO CAPS: 10.0000 mg | ORAL_CAPSULE | Freq: Every day | ORAL | Status: DC

## 2011-07-10 NOTE — Telephone Encounter (Signed)
Refill request to MD desk 

## 2011-07-11 ENCOUNTER — Other Ambulatory Visit (HOSPITAL_COMMUNITY): Payer: Self-pay | Admitting: Oncology

## 2011-07-11 ENCOUNTER — Encounter: Payer: Self-pay | Admitting: *Deleted

## 2011-07-11 DIAGNOSIS — C9000 Multiple myeloma not having achieved remission: Secondary | ICD-10-CM

## 2011-07-11 NOTE — Progress Notes (Signed)
RECEIVED TWO FAXES FROM [facility] CONCERNING A CONFIRMATION OF BENEFITS AND OF PRESCRIPTION SHIPMENT OF REVLIMID.

## 2011-07-23 ENCOUNTER — Other Ambulatory Visit: Payer: Self-pay | Admitting: Oncology

## 2011-07-23 DIAGNOSIS — C9000 Multiple myeloma not having achieved remission: Secondary | ICD-10-CM

## 2011-07-26 ENCOUNTER — Ambulatory Visit (HOSPITAL_BASED_OUTPATIENT_CLINIC_OR_DEPARTMENT_OTHER): Payer: Medicare Other

## 2011-07-26 ENCOUNTER — Encounter: Payer: Self-pay | Admitting: Medical Oncology

## 2011-07-26 ENCOUNTER — Telehealth: Payer: Self-pay | Admitting: Medical Oncology

## 2011-07-26 ENCOUNTER — Other Ambulatory Visit (HOSPITAL_BASED_OUTPATIENT_CLINIC_OR_DEPARTMENT_OTHER): Payer: Medicare Other | Admitting: Lab

## 2011-07-26 DIAGNOSIS — C9000 Multiple myeloma not having achieved remission: Secondary | ICD-10-CM

## 2011-07-26 DIAGNOSIS — Z7901 Long term (current) use of anticoagulants: Secondary | ICD-10-CM

## 2011-07-26 LAB — CBC WITH DIFFERENTIAL/PLATELET
BASO%: 0.8 % (ref 0.0–2.0)
Basophils Absolute: 0 10*3/uL (ref 0.0–0.1)
EOS%: 1.9 % (ref 0.0–7.0)
Eosinophils Absolute: 0.1 10*3/uL (ref 0.0–0.5)
HCT: 36.1 % (ref 34.8–46.6)
HGB: 11.6 g/dL (ref 11.6–15.9)
LYMPH%: 54.9 % — ABNORMAL HIGH (ref 14.0–49.7)
MCH: 31.1 pg (ref 25.1–34.0)
MCHC: 32.1 g/dL (ref 31.5–36.0)
MCV: 96.8 fL (ref 79.5–101.0)
MONO#: 0.5 10*3/uL (ref 0.1–0.9)
MONO%: 13.3 % (ref 0.0–14.0)
NEUT#: 1.1 10*3/uL — ABNORMAL LOW (ref 1.5–6.5)
NEUT%: 29.1 % — ABNORMAL LOW (ref 38.4–76.8)
Platelets: 202 10*3/uL (ref 145–400)
RBC: 3.73 10*6/uL (ref 3.70–5.45)
RDW: 14.5 % (ref 11.2–14.5)
WBC: 3.7 10*3/uL — ABNORMAL LOW (ref 3.9–10.3)
lymph#: 2 10*3/uL (ref 0.9–3.3)
nRBC: 0 % (ref 0–0)

## 2011-07-26 LAB — BASIC METABOLIC PANEL
BUN: 13 mg/dL (ref 6–23)
CO2: 25 mEq/L (ref 19–32)
Calcium: 9.3 mg/dL (ref 8.4–10.5)
Chloride: 108 mEq/L (ref 96–112)
Creatinine, Ser: 0.76 mg/dL (ref 0.50–1.10)
Glucose, Bld: 85 mg/dL (ref 70–99)
Potassium: 3.9 mEq/L (ref 3.5–5.3)
Sodium: 141 mEq/L (ref 135–145)

## 2011-07-26 LAB — PROTIME-INR
INR: 1.5 — ABNORMAL LOW (ref 2.00–3.50)
Protime: 18 Seconds — ABNORMAL HIGH (ref 10.6–13.4)

## 2011-07-26 MED ORDER — SODIUM CHLORIDE 0.9 % IV SOLN
Freq: Once | INTRAVENOUS | Status: AC
  Administered 2011-07-26: 10:00:00 via INTRAVENOUS

## 2011-07-26 MED ORDER — SODIUM CHLORIDE 0.9 % IJ SOLN
3.0000 mL | Freq: Once | INTRAMUSCULAR | Status: DC | PRN
  Filled 2011-07-26: qty 10

## 2011-07-26 MED ORDER — ZOLEDRONIC ACID 4 MG/100ML IV SOLN
4.0000 mg | Freq: Once | INTRAVENOUS | Status: AC
  Administered 2011-07-26: 4 mg via INTRAVENOUS
  Filled 2011-07-26: qty 100

## 2011-07-26 NOTE — Telephone Encounter (Signed)
I spoke with pt per Dr. Arline Asp to continue her 6 mg of coumadin. Next pt 08/23/11 with MD visit.

## 2011-07-26 NOTE — Patient Instructions (Signed)
[facility] Discharge Instructions for Patients Receiving Chemotherapy  Today you received the following chemotherapy agents Zometa To help prevent nausea and vomiting after your treatment, we encourage you to take your nausea medication as prescribed. If you develop nausea and vomiting that is not controlled by your nausea medication, call the clinic. If it is after clinic hours your family physician or the after hours number for the clinic or go to the Emergency Department.   BELOW ARE SYMPTOMS THAT SHOULD BE REPORTED IMMEDIATELY:  *FEVER GREATER THAN 100.5 F  *CHILLS WITH OR WITHOUT FEVER  NAUSEA AND VOMITING THAT IS NOT CONTROLLED WITH YOUR NAUSEA MEDICATION  *UNUSUAL SHORTNESS OF BREATH  *UNUSUAL BRUISING OR BLEEDING  TENDERNESS IN MOUTH AND THROAT WITH OR WITHOUT PRESENCE OF ULCERS  *URINARY PROBLEMS  *BOWEL PROBLEMS  UNUSUAL RASH Items with * indicate a potential emergency and should be followed up as soon as possible.  One of the nurses will contact you 24 hours after your treatment. Please let the nurse know about any problems that you may have experienced. Feel free to call the clinic you have any questions or concerns. The clinic phone number is [phone].   I have been informed and understand all the instructions given to me. I know to contact the clinic, my physician, or go to the Emergency Department if any problems should occur. I do not have any questions at this time, but understand that I may call the clinic during office hours   should I have any questions or need assistance in obtaining follow up care.    __________________________________________  _____________  __________ Signature of Patient or Authorized Representative            Date                   Time    __________________________________________ Nurse's Signature    

## 2011-07-30 ENCOUNTER — Ambulatory Visit: Payer: Medicare Other

## 2011-08-01 LAB — UIFE/LIGHT CHAINS/TP QN, 24-HR UR
Albumin, U: DETECTED
Alpha 1, Urine: DETECTED — AB
Alpha 2, Urine: DETECTED — AB
Beta, Urine: DETECTED — AB
Free Kappa Lt Chains,Ur: 10 mg/dL — ABNORMAL HIGH (ref 0.14–2.42)
Free Kappa/Lambda Ratio: 37.04 ratio — ABNORMAL HIGH (ref 2.04–10.37)
Free Lambda Excretion/Day: 5.4 mg/d
Free Lambda Lt Chains,Ur: 0.27 mg/dL (ref 0.02–0.67)
Free Lt Chn Excr Rate: 200 mg/d
Gamma Globulin, Urine: DETECTED — AB
Time: 24 hours
Total Protein, Urine-Ur/day: 218 mg/d — ABNORMAL HIGH (ref 10–140)
Total Protein, Urine: 10.9 mg/dL
Volume, Urine: 2000 mL

## 2011-08-08 ENCOUNTER — Other Ambulatory Visit: Payer: Self-pay | Admitting: *Deleted

## 2011-08-08 ENCOUNTER — Other Ambulatory Visit: Payer: Self-pay

## 2011-08-08 ENCOUNTER — Telehealth: Payer: Self-pay | Admitting: *Deleted

## 2011-08-08 DIAGNOSIS — C9000 Multiple myeloma not having achieved remission: Secondary | ICD-10-CM

## 2011-08-08 MED ORDER — LENALIDOMIDE 10 MG PO CAPS: ORAL_CAPSULE | ORAL | Status: DC

## 2011-08-08 NOTE — Telephone Encounter (Signed)
lvm that we had received refill request for revlimid today and faxed it to [name] pharmacy today. If there is any other problem with revlimid Rx please give [place] a call

## 2011-08-08 NOTE — Telephone Encounter (Signed)
[facility] faxed refill request for Revlimid.  Request to MD for review.

## 2011-08-09 ENCOUNTER — Encounter: Payer: Self-pay | Admitting: *Deleted

## 2011-08-09 NOTE — Progress Notes (Signed)
Confirmation from [facility] that Revlimid 10 mg script has been received and co pay is zero. They will contact patient for delivery.

## 2011-08-12 ENCOUNTER — Encounter: Payer: Self-pay | Admitting: *Deleted

## 2011-08-12 NOTE — Progress Notes (Signed)
RECEIVED A FAX FROM [facility] CONCERNING A CONFIRMATION OF PRESCRIPTION SHIPMENT FOR REVLIMID. 

## 2011-08-23 ENCOUNTER — Ambulatory Visit (HOSPITAL_BASED_OUTPATIENT_CLINIC_OR_DEPARTMENT_OTHER): Payer: Medicare Other | Admitting: Oncology

## 2011-08-23 ENCOUNTER — Encounter: Payer: Self-pay | Admitting: Oncology

## 2011-08-23 ENCOUNTER — Ambulatory Visit (HOSPITAL_BASED_OUTPATIENT_CLINIC_OR_DEPARTMENT_OTHER): Payer: Medicare Other

## 2011-08-23 ENCOUNTER — Telehealth: Payer: Self-pay | Admitting: *Deleted

## 2011-08-23 ENCOUNTER — Other Ambulatory Visit (HOSPITAL_BASED_OUTPATIENT_CLINIC_OR_DEPARTMENT_OTHER): Payer: Medicare Other | Admitting: Lab

## 2011-08-23 ENCOUNTER — Telehealth: Payer: Self-pay | Admitting: Oncology

## 2011-08-23 DIAGNOSIS — C9000 Multiple myeloma not having achieved remission: Secondary | ICD-10-CM

## 2011-08-23 DIAGNOSIS — Z7901 Long term (current) use of anticoagulants: Secondary | ICD-10-CM

## 2011-08-23 LAB — COMPREHENSIVE METABOLIC PANEL
ALT: 15 U/L (ref 0–35)
AST: 16 U/L (ref 0–37)
Albumin: 4.3 g/dL (ref 3.5–5.2)
Alkaline Phosphatase: 58 U/L (ref 39–117)
BUN: 15 mg/dL (ref 6–23)
CO2: 28 mEq/L (ref 19–32)
Calcium: 9.6 mg/dL (ref 8.4–10.5)
Chloride: 106 mEq/L (ref 96–112)
Creatinine, Ser: 0.93 mg/dL (ref 0.50–1.10)
Glucose, Bld: 85 mg/dL (ref 70–99)
Potassium: 4.2 mEq/L (ref 3.5–5.3)
Sodium: 140 mEq/L (ref 135–145)
Total Bilirubin: 0.4 mg/dL (ref 0.3–1.2)
Total Protein: 7.2 g/dL (ref 6.0–8.3)

## 2011-08-23 LAB — LACTATE DEHYDROGENASE: LDH: 179 U/L (ref 94–250)

## 2011-08-23 LAB — CBC WITH DIFFERENTIAL/PLATELET
BASO%: 0.3 % (ref 0.0–2.0)
Basophils Absolute: 0 10*3/uL (ref 0.0–0.1)
EOS%: 1.8 % (ref 0.0–7.0)
Eosinophils Absolute: 0.1 10*3/uL (ref 0.0–0.5)
HCT: 36 % (ref 34.8–46.6)
HGB: 11.5 g/dL — ABNORMAL LOW (ref 11.6–15.9)
LYMPH%: 45.6 % (ref 14.0–49.7)
MCH: 30.7 pg (ref 25.1–34.0)
MCHC: 31.9 g/dL (ref 31.5–36.0)
MCV: 96 fL (ref 79.5–101.0)
MONO#: 0.4 10*3/uL (ref 0.1–0.9)
MONO%: 9.1 % (ref 0.0–14.0)
NEUT#: 1.7 10*3/uL (ref 1.5–6.5)
NEUT%: 43.2 % (ref 38.4–76.8)
Platelets: 230 10*3/uL (ref 145–400)
RBC: 3.75 10*6/uL (ref 3.70–5.45)
RDW: 14.8 % — ABNORMAL HIGH (ref 11.2–14.5)
WBC: 3.9 10*3/uL (ref 3.9–10.3)
lymph#: 1.8 10*3/uL (ref 0.9–3.3)
nRBC: 0 % (ref 0–0)

## 2011-08-23 LAB — PROTEIN / CREATININE RATIO, URINE
Creatinine, Urine: 113.8 mg/dL
Protein Creatinine Ratio: 0.05 (ref <0.15)
Total Protein, Urine: 6 mg/dL

## 2011-08-23 LAB — PROTIME-INR
INR: 1.6 — ABNORMAL LOW (ref 2.00–3.50)
Protime: 19.2 Seconds — ABNORMAL HIGH (ref 10.6–13.4)

## 2011-08-23 MED ORDER — SODIUM CHLORIDE 0.9 % IV SOLN
INTRAVENOUS | Status: DC
  Administered 2011-08-23: 13:00:00 via INTRAVENOUS

## 2011-08-23 MED ORDER — ZOLEDRONIC ACID 4 MG/100ML IV SOLN
4.0000 mg | Freq: Once | INTRAVENOUS | Status: AC
  Administered 2011-08-23: 4 mg via INTRAVENOUS
  Filled 2011-08-23: qty 100

## 2011-08-23 NOTE — Telephone Encounter (Signed)
Per voicemail from [place], I have scheduled treatment appt for the patient.  JMW

## 2011-08-23 NOTE — Telephone Encounter (Signed)
Gave pt appt calendar for June , July and August 2013 lab MD and Zometa

## 2011-08-23 NOTE — Progress Notes (Signed)
CC:   Carmen Candle, MD Carmen Fisher. Carmen Fisher, [name] Carmen Feinstein, MD   PROBLEM LIST:  1. Multiple myeloma diagnosed in August 2010. She has kappa light  chain disease. In addition, she was found to have monoclonal IgG  as well in her urine back at the time of diagnosis. Bone marrow  initially showed 50% plasma cells. Metastatic bone survey carried  out on March 30, 2009 was negative. Ms. Fisher was initially  treated with Velcade, Decadron and Doxil for 4-1/2 cycles from  04/24/2009 through 07/27/2009. She developed peripheral  neuropathy. Another bone marrow was carried out on 08/03/2009,  showed 30% plasma cells. Revlimid and Decadron were given from  09/01/2009 through December 2011. Bone marrow on March 01, 2010  showed 5% plasma cells. The patient then underwent high-dose  melphalan with autologous stem cell infusion on 04/26/2010. She  has had an excellent response to that treatment. Since June 2012,  she has been receiving maintenance Revlimid and Zometa. Serum  immunofixation electrophoresis from 10/26/2010 was negative. Urine  immunofixation electrophoresis from July 2012 continues to show  monoclonal free kappa light chains. Total urine protein was 125 mg  at that time. Back at the time of diagnosis in December 2010, the  24-hour urine protein was over 8 g. The patient's most recent  metastatic bone survey carried out at [facility] on 02/07/2011 was  negative.  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 Symptoms  developed in April 2011. The patient is currently on Neurontin.  3. Hypertension.  4. GERD.  5. Dyslipidemia.  6. Glaucoma.   MEDICATIONS:  1. Zometa 4 mg IV to be given every 3 months.  This was started on     09/28/2010 and initially was given monthly.  2. Allopurinol 300 mg daily.  3. Vitamin B12 3000 mcg sublingually every day.  4. Neurontin 600 mg twice a day.  5. Xalatan 0.005% ophthalmic solution 1 drop at bedtime.  6. Revlimid 10 mg daily, 2  weeks on, 1 week off.  7. Ativan 1 mg every 4 hours as needed.  8. OxyIR 5 mg as needed.  9. Protonix 40 mg daily.  10.K-Dur 20 mEq daily.  11.Compazine 10 mg every 6 hours as needed.  12.Timolol 0.5% ophthalmic solution 1 drop twice a day.  13.Vitamin D.  14.Ergocalciferol 1000 units daily.  15.Coumadin 6 mg daily to maintain INR at approximately 1.5.     HISTORY:  I saw Carmen Fisher today for followup of her kappa light chain multiple myeloma diagnosed in December 2010.  Carmen Fisher was last seen by [place] on 06/28/2011.  She tells me that she saw Dr. Barbaraann Fisher at [facility] on 08/07/2011.  Her next appointment at [facility] will be in 1 year.  Carmen Fisher continues on Revlimid 10 mg daily 2 weeks on 1 week off.  She has about 6 more days before she goes on break.  She is tolerating the Revlimid well.  Her last Zometa was a month ago.  She denies any problems.  She was told by Dr. Barbaraann Fisher that the interval between Zometa treatments can be increased every 3 months.  The patient denies any major changes in her condition.  She continues to have neuropathy for which she takes Neurontin.  The neuropathy is certainly much improved over a few years ago.  She has some intermittent back discomfort, but this is not a new problem.  In general she feels well.  PHYSICAL EXAM:  Carmen Fisher looks well.  Weight is 168.8  pounds, height 5 feet 4-1/2 inches, body surface area 1.87  sq/m.  Blood pressure 117/70. Other vital signs are normal.  There is no scleral icterus.  Mouth and pharynx are benign.  No peripheral adenopathy palpable.  Heart/Lungs: Normal.  Breasts:  Not examined.  The patient states she has regular mammograms.  Abdomen:  Benign with no organomegaly or masses palpable. No skeletal tenderness.  Extremities:  No peripheral edema, clubbing, calf tenderness.  Neurologic:  Grossly normal.  The patient does not have a Port-A-Cath or central catheter.  LABORATORY DATA:  Today, white count 3.9,  ANC 1.7, hemoglobin 11.5, hematocrit 36.0, platelets 230,000.  Protime was 19.2 with an INR of 1.60.  Chemistries and urine protein to creatinine ratio are pending. Chemistries from 06/28/2011 essentially normal.  Glucose was 120, BUN 11, creatinine 1.10.  Serum kappa lambda ratio was 3.00.  A 24 hour urine collection from 07/30/2011 yielded 218 mg of protein as compared with 125 mg of protein on 04/19/2011.  The urine free kappa lambda ratio on 04/30 was 37.04, whereas it was 20.45 on 04/11/2011.  On 10/01/2010 the 24 hour urine protein was 125 mg and the urine free kappa lambda ratio was 27.06.   IMAGING STUDIES:  1. Chest x-ray, 2 view, from 03/21/2010 showed a tortuous aorta and  scoliosis, otherwise negative. 2. Metastatic bone survey was carried out at [facility] on 02/07/2011. This  was negative. The report has been scanned into the Epic system.    IMPRESSION AND PLAN:  Carmen Fisher continues to do extraordinarily well now 2-1/2 years from the time of diagnosis and approximately a year and a half from the time of her high-dose chemotherapy and autologous stem cell infusion.  Carmen Fisher will receive Zometa today 4 mg IV.  She will continue with the Revlimid 10 mg daily 2 weeks on 1 week off.  Will continue to check CBC and protime every month and plan to see the patient again in approximately 3 months, around November 15, 2011 at which time will check CBC, chemistries, protime, urine protein to creatinine ratio.  The patient will be scheduled for Zometa on 11/15/2011.  Will plan to treat her with Zometa every 3 months as suggested by Dr. Barbaraann Fisher.  We anticipate that her next visit will be in mid November with Zometa at that time.  We will probably be checking another 24 hour urine collection around the end of the year.    ______________________________ Samul Dada, [name] DSM/MEDQ  D:  08/23/2011  T:  08/23/2011  Job:  [id number]

## 2011-08-23 NOTE — Patient Instructions (Signed)
Call for any concerns 

## 2011-08-23 NOTE — Progress Notes (Signed)
This office note has been dictated.  #[id number]

## 2011-08-27 ENCOUNTER — Other Ambulatory Visit: Payer: Self-pay | Admitting: *Deleted

## 2011-08-27 DIAGNOSIS — C9000 Multiple myeloma not having achieved remission: Secondary | ICD-10-CM

## 2011-08-27 MED ORDER — LENALIDOMIDE 10 MG PO CAPS: ORAL_CAPSULE | ORAL | Status: DC

## 2011-08-27 NOTE — Telephone Encounter (Signed)
THIS REQUEST WAS GIVEN TO DR.MURINSON'S NURSE, ROBIN BASS,RN. 

## 2011-08-27 NOTE — Telephone Encounter (Signed)
Addended by: Arvilla Meres on: 08/27/2011 02:41 PM   Modules accepted: Orders

## 2011-08-30 ENCOUNTER — Telehealth: Payer: Self-pay | Admitting: Oncology

## 2011-08-30 NOTE — Telephone Encounter (Signed)
Talked to pt and she is aware of appt every month for lab and Zometa in August 2013 lab, ML and chemo

## 2011-09-13 ENCOUNTER — Other Ambulatory Visit: Payer: Self-pay | Admitting: Medical Oncology

## 2011-09-13 ENCOUNTER — Telehealth: Payer: Self-pay | Admitting: Medical Oncology

## 2011-09-13 DIAGNOSIS — C9000 Multiple myeloma not having achieved remission: Secondary | ICD-10-CM

## 2011-09-13 MED ORDER — LENALIDOMIDE 10 MG PO CAPS: ORAL_CAPSULE | ORAL | Status: DC

## 2011-09-13 NOTE — Telephone Encounter (Signed)
Hard copy given to Dr Murinson's  nurse

## 2011-09-13 NOTE — Telephone Encounter (Signed)
Faxed to [facility] 

## 2011-09-16 ENCOUNTER — Encounter: Payer: Self-pay | Admitting: *Deleted

## 2011-09-16 NOTE — Progress Notes (Signed)
RECEIVED A FAX FROM [facility] CONCERNING PT.'S PRESCRIPTION BENEFITS

## 2011-09-20 ENCOUNTER — Other Ambulatory Visit (HOSPITAL_BASED_OUTPATIENT_CLINIC_OR_DEPARTMENT_OTHER): Payer: Medicare Other | Admitting: Lab

## 2011-09-20 DIAGNOSIS — Z7901 Long term (current) use of anticoagulants: Secondary | ICD-10-CM

## 2011-09-20 LAB — CBC WITH DIFFERENTIAL/PLATELET
BASO%: 1.6 % (ref 0.0–2.0)
Basophils Absolute: 0.1 10*3/uL (ref 0.0–0.1)
EOS%: 4.3 % (ref 0.0–7.0)
Eosinophils Absolute: 0.2 10*3/uL (ref 0.0–0.5)
HCT: 34.7 % — ABNORMAL LOW (ref 34.8–46.6)
HGB: 11.3 g/dL — ABNORMAL LOW (ref 11.6–15.9)
LYMPH%: 45.5 % (ref 14.0–49.7)
MCH: 31 pg (ref 25.1–34.0)
MCHC: 32.6 g/dL (ref 31.5–36.0)
MCV: 95.1 fL (ref 79.5–101.0)
MONO#: 0.5 10*3/uL (ref 0.1–0.9)
MONO%: 10.2 % (ref 0.0–14.0)
NEUT#: 1.7 10*3/uL (ref 1.5–6.5)
NEUT%: 38.4 % (ref 38.4–76.8)
Platelets: 196 10*3/uL (ref 145–400)
RBC: 3.65 10*6/uL — ABNORMAL LOW (ref 3.70–5.45)
RDW: 14.4 % (ref 11.2–14.5)
WBC: 4.4 10*3/uL (ref 3.9–10.3)
lymph#: 2 10*3/uL (ref 0.9–3.3)

## 2011-09-20 LAB — PROTIME-INR
INR: 1.6 — ABNORMAL LOW (ref 2.00–3.50)
Protime: 19.2 Seconds — ABNORMAL HIGH (ref 10.6–13.4)

## 2011-09-23 ENCOUNTER — Other Ambulatory Visit: Payer: Self-pay | Admitting: Oncology

## 2011-09-23 DIAGNOSIS — C9000 Multiple myeloma not having achieved remission: Secondary | ICD-10-CM

## 2011-09-24 ENCOUNTER — Other Ambulatory Visit: Payer: Self-pay

## 2011-09-24 NOTE — Telephone Encounter (Signed)
Pt called stating pharmacy did not get refill authorization yet. I called [facility] for pt and they got authorization today. When i called pt back i asked when she originally called pharmacy and she said yesterday. Pt thanked me for calling.

## 2011-10-04 ENCOUNTER — Other Ambulatory Visit: Payer: Self-pay | Admitting: *Deleted

## 2011-10-04 NOTE — Telephone Encounter (Signed)
THIS REFILL REQUEST FOR REVLIMID WAS GIVEN TO DR.MURINSON'S NURSE, JAN MYERS,RN.

## 2011-10-07 ENCOUNTER — Other Ambulatory Visit: Payer: Self-pay | Admitting: Nurse Practitioner

## 2011-10-07 DIAGNOSIS — C9000 Multiple myeloma not having achieved remission: Secondary | ICD-10-CM

## 2011-10-07 MED ORDER — LENALIDOMIDE 10 MG PO CAPS: ORAL_CAPSULE | ORAL | Status: DC

## 2011-10-08 NOTE — Telephone Encounter (Signed)
RECEIVED A FAX FROM [facility] CONCERNING A CONFIRMATION OF PRESCRIPTION SHIPMENT FOR REVLIMID. 

## 2011-10-08 NOTE — Telephone Encounter (Signed)
RECEIVED A FAX FROM [facility] CONCERNING A CONFIRMATION OF FACSIMILE RECEIPT WHICH VERIFIES PT.'S PRESCRIPTION BENEFITS.

## 2011-10-18 ENCOUNTER — Other Ambulatory Visit (HOSPITAL_BASED_OUTPATIENT_CLINIC_OR_DEPARTMENT_OTHER): Payer: Medicare Other | Admitting: Lab

## 2011-10-18 DIAGNOSIS — C9000 Multiple myeloma not having achieved remission: Secondary | ICD-10-CM

## 2011-10-18 DIAGNOSIS — Z7901 Long term (current) use of anticoagulants: Secondary | ICD-10-CM

## 2011-10-18 LAB — CBC WITH DIFFERENTIAL/PLATELET
BASO%: 0.9 % (ref 0.0–2.0)
Basophils Absolute: 0 10*3/uL (ref 0.0–0.1)
EOS%: 3.4 % (ref 0.0–7.0)
Eosinophils Absolute: 0.1 10*3/uL (ref 0.0–0.5)
HCT: 34.4 % — ABNORMAL LOW (ref 34.8–46.6)
HGB: 11.2 g/dL — ABNORMAL LOW (ref 11.6–15.9)
LYMPH%: 48.4 % (ref 14.0–49.7)
MCH: 30.8 pg (ref 25.1–34.0)
MCHC: 32.6 g/dL (ref 31.5–36.0)
MCV: 94.5 fL (ref 79.5–101.0)
MONO#: 0.3 10*3/uL (ref 0.1–0.9)
MONO%: 8 % (ref 0.0–14.0)
NEUT#: 1.4 10*3/uL — ABNORMAL LOW (ref 1.5–6.5)
NEUT%: 39.3 % (ref 38.4–76.8)
Platelets: 202 10*3/uL (ref 145–400)
RBC: 3.64 10*6/uL — ABNORMAL LOW (ref 3.70–5.45)
RDW: 14.6 % — ABNORMAL HIGH (ref 11.2–14.5)
WBC: 3.5 10*3/uL — ABNORMAL LOW (ref 3.9–10.3)
lymph#: 1.7 10*3/uL (ref 0.9–3.3)

## 2011-10-18 LAB — PROTIME-INR
INR: 1.5 — ABNORMAL LOW (ref 2.00–3.50)
Protime: 18 Seconds — ABNORMAL HIGH (ref 10.6–13.4)

## 2011-10-22 ENCOUNTER — Other Ambulatory Visit: Payer: Self-pay | Admitting: *Deleted

## 2011-10-22 DIAGNOSIS — C9000 Multiple myeloma not having achieved remission: Secondary | ICD-10-CM

## 2011-10-22 MED ORDER — LENALIDOMIDE 10 MG PO CAPS: 10.0000 mg | ORAL_CAPSULE | Freq: Every day | ORAL | Status: DC

## 2011-10-23 NOTE — Telephone Encounter (Signed)
[facility] faxed Rx communication report.  Revlimid has been verified and the co-pay amt is 0.00.  Prescription will be dispensed pending contact with pt.

## 2011-11-01 ENCOUNTER — Other Ambulatory Visit: Payer: Self-pay | Admitting: Oncology

## 2011-11-01 DIAGNOSIS — C9000 Multiple myeloma not having achieved remission: Secondary | ICD-10-CM

## 2011-11-14 ENCOUNTER — Other Ambulatory Visit: Payer: Self-pay | Admitting: Oncology

## 2011-11-14 DIAGNOSIS — C9000 Multiple myeloma not having achieved remission: Secondary | ICD-10-CM

## 2011-11-15 ENCOUNTER — Telehealth: Payer: Self-pay | Admitting: *Deleted

## 2011-11-15 ENCOUNTER — Other Ambulatory Visit: Payer: Self-pay | Admitting: *Deleted

## 2011-11-15 ENCOUNTER — Telehealth: Payer: Self-pay | Admitting: Oncology

## 2011-11-15 ENCOUNTER — Other Ambulatory Visit (HOSPITAL_BASED_OUTPATIENT_CLINIC_OR_DEPARTMENT_OTHER): Payer: Medicare Other

## 2011-11-15 ENCOUNTER — Ambulatory Visit (HOSPITAL_BASED_OUTPATIENT_CLINIC_OR_DEPARTMENT_OTHER): Payer: Medicare Other

## 2011-11-15 ENCOUNTER — Ambulatory Visit (HOSPITAL_BASED_OUTPATIENT_CLINIC_OR_DEPARTMENT_OTHER): Payer: Medicare Other | Admitting: Oncology

## 2011-11-15 ENCOUNTER — Encounter: Payer: Self-pay | Admitting: Oncology

## 2011-11-15 VITALS — BP 135/82 | HR 68 | Temp 97.1°F | Resp 20 | Ht 64.5 in | Wt 171.8 lb

## 2011-11-15 DIAGNOSIS — G589 Mononeuropathy, unspecified: Secondary | ICD-10-CM

## 2011-11-15 DIAGNOSIS — C9000 Multiple myeloma not having achieved remission: Secondary | ICD-10-CM

## 2011-11-15 DIAGNOSIS — Z7901 Long term (current) use of anticoagulants: Secondary | ICD-10-CM

## 2011-11-15 LAB — CBC WITH DIFFERENTIAL/PLATELET
BASO%: 0.7 % (ref 0.0–2.0)
Basophils Absolute: 0 10*3/uL (ref 0.0–0.1)
EOS%: 1.2 % (ref 0.0–7.0)
Eosinophils Absolute: 0.1 10*3/uL (ref 0.0–0.5)
HCT: 34.9 % (ref 34.8–46.6)
HGB: 11.3 g/dL — ABNORMAL LOW (ref 11.6–15.9)
LYMPH%: 43.1 % (ref 14.0–49.7)
MCH: 31 pg (ref 25.1–34.0)
MCHC: 32.4 g/dL (ref 31.5–36.0)
MCV: 95.9 fL (ref 79.5–101.0)
MONO#: 0.6 10*3/uL (ref 0.1–0.9)
MONO%: 14.9 % — ABNORMAL HIGH (ref 0.0–14.0)
NEUT#: 1.6 10*3/uL (ref 1.5–6.5)
NEUT%: 40.1 % (ref 38.4–76.8)
Platelets: 178 10*3/uL (ref 145–400)
RBC: 3.64 10*6/uL — ABNORMAL LOW (ref 3.70–5.45)
RDW: 14.8 % — ABNORMAL HIGH (ref 11.2–14.5)
WBC: 4 10*3/uL (ref 3.9–10.3)
lymph#: 1.7 10*3/uL (ref 0.9–3.3)
nRBC: 0 % (ref 0–0)

## 2011-11-15 LAB — PROTEIN / CREATININE RATIO, URINE
Creatinine, Urine: 134.2 mg/dL
Protein Creatinine Ratio: 0.09 (ref <0.15)
Total Protein, Urine: 12 mg/dL

## 2011-11-15 LAB — PROTIME-INR
INR: 1.7 — ABNORMAL LOW (ref 2.00–3.50)
Protime: 20.4 Seconds — ABNORMAL HIGH (ref 10.6–13.4)

## 2011-11-15 MED ORDER — ZOLEDRONIC ACID 4 MG/100ML IV SOLN
4.0000 mg | Freq: Once | INTRAVENOUS | Status: AC
  Administered 2011-11-15: 4 mg via INTRAVENOUS
  Filled 2011-11-15: qty 100

## 2011-11-15 MED ORDER — LENALIDOMIDE 10 MG PO CAPS: 10.0000 mg | ORAL_CAPSULE | Freq: Every day | ORAL | Status: DC

## 2011-11-15 NOTE — Patient Instructions (Signed)
[facility] Discharge Instructions for Patients Receiving Chemotherapy  Today you received the following chemotherapy agents: Zometa  To help prevent nausea and vomiting after your treatment, we encourage you to take your nausea medication. Take it as often as prescribed.     If you develop nausea and vomiting that is not controlled by your nausea medication, call the clinic. If it is after clinic hours your family physician or the after hours number for the clinic or go to the Emergency Department.   BELOW ARE SYMPTOMS THAT SHOULD BE REPORTED IMMEDIATELY:  *FEVER GREATER THAN 100.5 F  *CHILLS WITH OR WITHOUT FEVER  NAUSEA AND VOMITING THAT IS NOT CONTROLLED WITH YOUR NAUSEA MEDICATION  *UNUSUAL SHORTNESS OF BREATH  *UNUSUAL BRUISING OR BLEEDING  TENDERNESS IN MOUTH AND THROAT WITH OR WITHOUT PRESENCE OF ULCERS  *URINARY PROBLEMS  *BOWEL PROBLEMS  UNUSUAL RASH Items with * indicate a potential emergency and should be followed up as soon as possible.  Feel free to call the clinic you have any questions or concerns. The clinic phone number is [phone].   I have been informed and understand all the instructions given to me. I know to contact the clinic, my physician, or go to the Emergency Department if any problems should occur. I do not have any questions at this time, but understand that I may call the clinic during office hours   should I have any questions or need assistance in obtaining follow up care.    __________________________________________  _____________  __________ Signature of Patient or Authorized Representative            Date                   Time    __________________________________________ Nurse's Signature

## 2011-11-15 NOTE — Progress Notes (Signed)
This office note has been dictated.  #[id number]

## 2011-11-15 NOTE — Telephone Encounter (Signed)
Per staff message and POF I have scheduled appt.  JMW  

## 2011-11-15 NOTE — Telephone Encounter (Signed)
THIS REFILL REQUEST FOR REVLIMID WAS GIVEN TO DR.MURINSON'S NURSE, ROBIN BASS,RN. 

## 2011-11-15 NOTE — Addendum Note (Signed)
Addended by: Arvilla Meres on: 11/15/2011 02:37 PM   Modules accepted: Orders

## 2011-11-15 NOTE — Progress Notes (Signed)
CC:   [name], MD [name], [name] [name], MD  PROBLEM LIST:  1. Multiple myeloma diagnosed in August 2010. She has kappa light  chain disease. In addition, she was found to have monoclonal IgG  as well in her urine back at the time of diagnosis. Bone marrow  initially showed 50% plasma cells. Metastatic bone survey carried  out on March 30, 2009 was negative. [name] was initially  treated with Velcade, Decadron and Doxil for 4-1/2 cycles from  04/24/2009 through 07/27/2009. She developed peripheral  neuropathy. Another bone marrow was carried out on 08/03/2009,  showed 30% plasma cells. Revlimid and Decadron were given from  09/01/2009 through December 2011. Bone marrow on March 01, 2010  showed 5% plasma cells. The patient then underwent high-dose  melphalan with autologous stem cell infusion on 04/26/2010. She  has had an excellent response to that treatment. Since June 2012,  she has been receiving maintenance Revlimid and Zometa. Serum  immunofixation electrophoresis from 10/26/2010 was negative. Urine  immunofixation electrophoresis from July 2012 continues to show  monoclonal free kappa light chains. Total urine protein was 125 mg  at that time. Back at the time of diagnosis in December 2010, the  24-hour urine protein was over 8 g. The patient's most recent  metastatic bone survey carried out at [facility] on 02/07/2011 was  negative.  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 Symptoms  developed in April 2011. The patient is currently on Neurontin.  3. Hypertension.  4. GERD.  5. Dyslipidemia.  6. Glaucoma.    MEDICATIONS:  1. Zometa 4 mg IV to be given every 3 months. This was started on  09/28/2010 and initially was given monthly.  2. Allopurinol 300 mg daily.  3. Vitamin B12 3000 mcg sublingually every day.  4. Neurontin 600 mg twice a day.  5. Xalatan 0.005% ophthalmic solution 1 drop at bedtime.  6. Revlimid 10 mg daily, 2  weeks on, 1 week off.  7. Ativan 1 mg every 4 hours as needed.  8. OxyIR 5 mg as needed.  9. Protonix 40 mg daily.  10.K-Dur 20 mEq daily.  11.Compazine 10 mg every 6 hours as needed.  12.Timolol 0.5% ophthalmic solution 1 drop twice a day.  13.Vitamin D.  14.Ergocalciferol 1000 units daily.  15.Coumadin 6 mg daily to maintain INR at approximately 1.5.  Pneumovax was given in July 2012. Flu shot was given on February 12, 2011. Inoculations following high-dose therapy and autologous stem cell infusion were given on May 05, 2011.  SMOKING HISTORY:  The patient has never smoked cigarettes.  HISTORY:  Carmen Fisher was seen today for followup of her kappa light chain multiple myeloma diagnosed in December 2010.  Ms. Cutshaw was last seen by [place] on 08/23/2011, at which time she received Zometa 4 mg IV. The patient continues to take Revlimid 10 mg daily, 2 weeks on, 1 week off.  She is currently on her break week and stopped taking Revlimid on August 13th, to resume again around August 20th.  The patient also takes Coumadin 6 mg daily for prophylaxis.  We try to maintain her INR somewhere around 1.5.  The patient is receiving Zometa every 3 months. This was last administered on 08/23/2011 and prior to that on 07/26/2011 and 06/28/2011.  The patient tolerates these treatments well.  Her neuropathy is about the same.  She takes Neurontin.  Neuropathy is markedly improved from a few years ago during the time that she  was taking the Velcade.  The patient tells me that she does have some lower back discomfort, for which she takes Tylenol.  That has not changed. She was prescribed Premarin cream a month ago and uses this twice weekly.  There have been no changes in the patient's condition over the past 3 months since her last visit here on 08/23/2011.  PHYSICAL EXAMINATION:  General:  Ms. Buell looks well.  Vital Signs: Weight is 181.8 pounds, height 5 feet 4-1/2 inches, body surface  area 1.88 sq m.  Blood pressure 135/82.  Other vital signs are normal. HEENT:  There is no scleral icterus.  Mouth and pharynx are benign. Lymph:  No peripheral adenopathy palpable.  Heart and Lungs:  Normal. Breasts:  Not examined.  The patient does obtain regular mammograms. Abdomen:  Benign.  No skeletal tenderness.  Extremities:  No peripheral edema.  Neurologic Exam:  Normal.  The patient does not have a Port-A- Cath or central catheter.  LABORATORY DATA:  Today white count 4.0, ANC 1.6, hemoglobin 11.3, hematocrit 34.9, platelets 178,000.  Pro-time today was 20.4 with an INR of 1.70.  We are checking CBC and pro-times on a monthly basis. Chemistries and urine protein to creatinine ratio are pending. Chemistries from 08/23/2011 were normal.  Liver function tests including LDH were normal.  Albumin was 4.3, BUN 15, creatinine 0.93.  The urine protein to creatinine ratio on 08/23/2011 was 0.05, which is normal.  It will be recalled that the last 24-hour urine collection from 07/30/2011 yielded 218 mg of protein.  The urine immunofixation electrophoresis at that time was positive for monoclonal free kappa light chains, although in the past the serum immunofixation electrophoresis was negative, specifically on 10/26/2010.  IMAGING STUDIES:  1. Chest x-ray, 2 view, from 03/21/2010 showed a tortuous aorta and  scoliosis, otherwise negative.  2. Metastatic bone survey was carried out at [facility] on 02/07/2011. This  was negative. The report has been scanned into the Epic system.    IMPRESSION AND PLAN:  [name] continues to do well, now 3 years from the time of diagnosis in August 2010 and about a year and a half from the time of high-dose therapy and autologous stem cell infusion.  [name] will receive Zometa today, 4 mg IV.  We are giving this every 3 months.  The patient will continue with Revlimid 10 mg daily, 2 weeks on, 1 week off.  She will remain on Coumadin for prophylaxis,  currently 6 mg daily.  We will check CBC and pro-time on a monthly basis.  We will plan to see the patient again around November 8th, at which time she will be due for another dose of IV Zometa 4 mg.  We will check CBC, chemistries, and urine protein to creatinine ratio.  Following that visit, we will set the patient up for a 24-hour urine collection and a metastatic bone survey.  It will be recalled that she did have a metastatic bone survey at [facility] on 02/07/2011 and apparently that was negative.    ______________________________ Samul Dada, [name] DSM/MEDQ  D:  11/15/2011  T:  11/15/2011  Job:  [id number]

## 2011-11-15 NOTE — Telephone Encounter (Signed)
appts made and printed for pt pt aware that tx will follow 11/5

## 2011-11-18 ENCOUNTER — Ambulatory Visit: Payer: Medicare Other

## 2011-11-18 ENCOUNTER — Telehealth: Payer: Self-pay | Admitting: Medical Oncology

## 2011-11-18 DIAGNOSIS — C9000 Multiple myeloma not having achieved remission: Secondary | ICD-10-CM

## 2011-11-18 LAB — COMPREHENSIVE METABOLIC PANEL
ALT: 19 U/L (ref 0–35)
AST: 18 U/L (ref 0–37)
Albumin: 4 g/dL (ref 3.5–5.2)
Alkaline Phosphatase: 64 U/L (ref 39–117)
BUN: 14 mg/dL (ref 6–23)
CO2: 27 mEq/L (ref 19–32)
Calcium: 9 mg/dL (ref 8.4–10.5)
Chloride: 107 mEq/L (ref 96–112)
Creatinine, Ser: 0.9 mg/dL (ref 0.50–1.10)
Glucose, Bld: 86 mg/dL (ref 70–99)
Potassium: 4.3 mEq/L (ref 3.5–5.3)
Sodium: 143 mEq/L (ref 135–145)
Total Bilirubin: 0.3 mg/dL (ref 0.3–1.2)
Total Protein: 6.9 g/dL (ref 6.0–8.3)

## 2011-11-18 LAB — LACTATE DEHYDROGENASE: LDH: 181 U/L (ref 94–250)

## 2011-11-18 NOTE — Telephone Encounter (Signed)
RECEIVED A FAX FROM [facility] CONCERNING THE REVLIMID PRESCRIPTION.THIS PRESCRIPTION IS TOO EARLY TO FILL. IT WILL BE FILLED AND PROCESSED ON 11/26/11. NOTIFIED DR.MURINSON'S NURSE, ROBIN BASS,RN.

## 2011-11-18 NOTE — Telephone Encounter (Signed)
Pt 's labs hemolyzed Friday. Per Dr. Arline Asp she can come in one day this week to have redrawn. Unable to reach her will try later.

## 2011-11-18 NOTE — Telephone Encounter (Signed)
I spoke with pt to let her know that her labs hemolyzed last week. Dr. Arline Asp would like for her to come and get these drawn this week. She would like to come today. Schedulers to call with time.

## 2011-12-04 ENCOUNTER — Telehealth: Payer: Self-pay | Admitting: *Deleted

## 2011-12-04 NOTE — Telephone Encounter (Signed)
[facility] faxed Revlimid refill.  Request to MD for review.

## 2011-12-05 ENCOUNTER — Other Ambulatory Visit: Payer: Self-pay | Admitting: *Deleted

## 2011-12-05 DIAGNOSIS — C9000 Multiple myeloma not having achieved remission: Secondary | ICD-10-CM

## 2011-12-05 MED ORDER — LENALIDOMIDE 10 MG PO CAPS: 10.0000 mg | ORAL_CAPSULE | Freq: Every day | ORAL | Status: DC

## 2011-12-05 NOTE — Telephone Encounter (Signed)
REVLIMID PRESCRIPTION WAS FAXED TO [facility]. NOTIFIED [facility].

## 2011-12-06 NOTE — Telephone Encounter (Signed)
RECEIVED TWO FAXES FROM [facility] CONCERNING A CONFIRMATION OF PT.'S REFERRAL AND OF PRESCRIPTION SHIPMENT FOR REVLIMID ON 12/09/10.

## 2011-12-12 ENCOUNTER — Other Ambulatory Visit: Payer: Self-pay | Admitting: Medical Oncology

## 2011-12-12 ENCOUNTER — Encounter: Payer: Self-pay | Admitting: Oncology

## 2011-12-12 ENCOUNTER — Other Ambulatory Visit (HOSPITAL_BASED_OUTPATIENT_CLINIC_OR_DEPARTMENT_OTHER): Payer: Medicare Other | Admitting: Lab

## 2011-12-12 ENCOUNTER — Telehealth: Payer: Self-pay | Admitting: Medical Oncology

## 2011-12-12 DIAGNOSIS — Z5181 Encounter for therapeutic drug level monitoring: Secondary | ICD-10-CM

## 2011-12-12 DIAGNOSIS — C9000 Multiple myeloma not having achieved remission: Secondary | ICD-10-CM

## 2011-12-12 LAB — CBC WITH DIFFERENTIAL/PLATELET
BASO%: 1.4 % (ref 0.0–2.0)
Basophils Absolute: 0 10*3/uL (ref 0.0–0.1)
EOS%: 3.4 % (ref 0.0–7.0)
Eosinophils Absolute: 0.1 10*3/uL (ref 0.0–0.5)
HCT: 35.9 % (ref 34.8–46.6)
HGB: 11.4 g/dL — ABNORMAL LOW (ref 11.6–15.9)
LYMPH%: 49.8 % — ABNORMAL HIGH (ref 14.0–49.7)
MCH: 30.9 pg (ref 25.1–34.0)
MCHC: 31.8 g/dL (ref 31.5–36.0)
MCV: 97.3 fL (ref 79.5–101.0)
MONO#: 0.4 10*3/uL (ref 0.1–0.9)
MONO%: 14.2 % — ABNORMAL HIGH (ref 0.0–14.0)
NEUT#: 0.9 10*3/uL — ABNORMAL LOW (ref 1.5–6.5)
NEUT%: 31.2 % — ABNORMAL LOW (ref 38.4–76.8)
Platelets: 210 10*3/uL (ref 145–400)
RBC: 3.69 10*6/uL — ABNORMAL LOW (ref 3.70–5.45)
RDW: 14.7 % — ABNORMAL HIGH (ref 11.2–14.5)
WBC: 3 10*3/uL — ABNORMAL LOW (ref 3.9–10.3)
lymph#: 1.5 10*3/uL (ref 0.9–3.3)
nRBC: 0 % (ref 0–0)

## 2011-12-12 LAB — PROTIME-INR
INR: 1.6 — ABNORMAL LOW (ref 2.00–3.50)
Protime: 19.2 Seconds — ABNORMAL HIGH (ref 10.6–13.4)

## 2011-12-12 NOTE — Telephone Encounter (Signed)
I called pt to ask her when she started her Revlimid. She states yesterday. Per Dr. Arline Asp I asked her to hold. I explained that her WBC counts are low. I will ask Dr. Arline Asp when he would like for her to repeat labs and give her a call back. She voiced understanding.

## 2011-12-12 NOTE — Telephone Encounter (Signed)
I called pt to let her know that Dr. Arline Asp would like to check a CBC in a week. She is aware the schedulers will call her.

## 2011-12-12 NOTE — Progress Notes (Signed)
The patient had a CBC today which showed a white count of 3.0 and an ANC of 0.9.  Her Revlimid schedule is 10 mg daily, 2 weeks on, 1 week off. The patient had restarted her Revlimid yesterday, 12/11/2011.  We instructed her to hold Revlimid for another week. We will recheck the CBC on 12/19/2011. We may want to consider changing the schedule to one-week on and one-week off if we continue to see low white counts.

## 2011-12-20 ENCOUNTER — Encounter: Payer: Self-pay | Admitting: Oncology

## 2011-12-20 ENCOUNTER — Telehealth: Payer: Self-pay

## 2011-12-20 ENCOUNTER — Ambulatory Visit (HOSPITAL_BASED_OUTPATIENT_CLINIC_OR_DEPARTMENT_OTHER): Payer: Medicare Other

## 2011-12-20 DIAGNOSIS — D649 Anemia, unspecified: Secondary | ICD-10-CM

## 2011-12-20 DIAGNOSIS — C9000 Multiple myeloma not having achieved remission: Secondary | ICD-10-CM

## 2011-12-20 LAB — CBC WITH DIFFERENTIAL/PLATELET
BASO%: 0.5 % (ref 0.0–2.0)
Basophils Absolute: 0 10*3/uL (ref 0.0–0.1)
EOS%: 0.6 % (ref 0.0–7.0)
Eosinophils Absolute: 0 10*3/uL (ref 0.0–0.5)
HCT: 36.1 % (ref 34.8–46.6)
HGB: 11.6 g/dL (ref 11.6–15.9)
LYMPH%: 42.9 % (ref 14.0–49.7)
MCH: 32 pg (ref 25.1–34.0)
MCHC: 32.2 g/dL (ref 31.5–36.0)
MCV: 99.4 fL (ref 79.5–101.0)
MONO#: 0.5 10*3/uL (ref 0.1–0.9)
MONO%: 9.1 % (ref 0.0–14.0)
NEUT#: 2.4 10*3/uL (ref 1.5–6.5)
NEUT%: 46.9 % (ref 38.4–76.8)
Platelets: 206 10*3/uL (ref 145–400)
RBC: 3.63 10*6/uL — ABNORMAL LOW (ref 3.70–5.45)
RDW: 15.5 % — ABNORMAL HIGH (ref 11.2–14.5)
WBC: 5.1 10*3/uL (ref 3.9–10.3)
lymph#: 2.2 10*3/uL (ref 0.9–3.3)

## 2011-12-20 LAB — BASIC METABOLIC PANEL (CC13)
BUN: 17 mg/dL (ref 7.0–26.0)
CO2: 25 mEq/L (ref 22–29)
Calcium: 9.7 mg/dL (ref 8.4–10.4)
Chloride: 110 mEq/L — ABNORMAL HIGH (ref 98–107)
Creatinine: 1 mg/dL (ref 0.6–1.1)
Glucose: 96 mg/dl (ref 70–99)
Potassium: 4 mEq/L (ref 3.5–5.1)
Sodium: 142 mEq/L (ref 136–145)

## 2011-12-20 NOTE — Progress Notes (Signed)
This patient had been on Revlimid 10 mg daily, 2 weeks on, 1 week off. On 12/12/2011 the ANC was 0.9 and the patient was instructed to hold Revlimid for an additional week.  Today, 12/20/2011, white count was 5.1, ANC 2.4, hemoglobin 11.6, hematocrit 36.1 and platelets 206,000. The patient will resume Revlimid as above. We will check a CBC in approximately 3 weeks before she takes another course of Revlimid. We may need to change the Revlimid program to one-week on one-week off if the patient continues to have low blood counts.

## 2011-12-20 NOTE — Telephone Encounter (Signed)
Dr Arline Asp stated to start revlimid today for 2 weeks and then 1 week off. CBC on 01/09/12 before she starts her next round. LVM on cell and home phone

## 2011-12-24 ENCOUNTER — Other Ambulatory Visit: Payer: Self-pay | Admitting: *Deleted

## 2011-12-24 DIAGNOSIS — C9000 Multiple myeloma not having achieved remission: Secondary | ICD-10-CM

## 2011-12-24 NOTE — Telephone Encounter (Signed)
THIS REFILL REQUEST FOR REVLIMID WAS GIVEN TO DR.MURINSON'S NURSE, KATHY BUYCK,RN. 

## 2011-12-26 MED ORDER — LENALIDOMIDE 10 MG PO CAPS: 10.0000 mg | ORAL_CAPSULE | Freq: Every day | ORAL | Status: DC

## 2011-12-26 NOTE — Addendum Note (Signed)
Addended by: Arvilla Meres on: 12/26/2011 09:27 AM   Modules accepted: Orders

## 2011-12-26 NOTE — Telephone Encounter (Signed)
RECEIVED A FAX FROM [facility] CONCERNING A CONFIRMATION OF FACSIMILE RECEIPT FOR PT.'S REFERRAL. 

## 2011-12-27 NOTE — Telephone Encounter (Signed)
RECEIVED A FAX FROM [facility] CONCERNING A CONFIRMATION OF PRESCRIPTION SHIPMENT FOR REVLIMID ON 12/27/11.

## 2012-01-09 ENCOUNTER — Other Ambulatory Visit (HOSPITAL_BASED_OUTPATIENT_CLINIC_OR_DEPARTMENT_OTHER): Payer: Medicare Other | Admitting: Lab

## 2012-01-09 ENCOUNTER — Telehealth: Payer: Self-pay

## 2012-01-09 ENCOUNTER — Telehealth: Payer: Self-pay | Admitting: Oncology

## 2012-01-09 ENCOUNTER — Other Ambulatory Visit: Payer: Self-pay

## 2012-01-09 DIAGNOSIS — Z7901 Long term (current) use of anticoagulants: Secondary | ICD-10-CM

## 2012-01-09 DIAGNOSIS — C9000 Multiple myeloma not having achieved remission: Secondary | ICD-10-CM

## 2012-01-09 LAB — CBC WITH DIFFERENTIAL/PLATELET
BASO%: 0.6 % (ref 0.0–2.0)
Basophils Absolute: 0 10*3/uL (ref 0.0–0.1)
EOS%: 1.6 % (ref 0.0–7.0)
Eosinophils Absolute: 0.1 10*3/uL (ref 0.0–0.5)
HCT: 35.8 % (ref 34.8–46.6)
HGB: 11.6 g/dL (ref 11.6–15.9)
LYMPH%: 49.7 % (ref 14.0–49.7)
MCH: 31.3 pg (ref 25.1–34.0)
MCHC: 32.4 g/dL (ref 31.5–36.0)
MCV: 96.5 fL (ref 79.5–101.0)
MONO#: 0.4 10*3/uL (ref 0.1–0.9)
MONO%: 11.8 % (ref 0.0–14.0)
NEUT#: 1.2 10*3/uL — ABNORMAL LOW (ref 1.5–6.5)
NEUT%: 36.3 % — ABNORMAL LOW (ref 38.4–76.8)
Platelets: 216 10*3/uL (ref 145–400)
RBC: 3.71 10*6/uL (ref 3.70–5.45)
RDW: 14.3 % (ref 11.2–14.5)
WBC: 3.2 10*3/uL — ABNORMAL LOW (ref 3.9–10.3)
lymph#: 1.6 10*3/uL (ref 0.9–3.3)
nRBC: 0 % (ref 0–0)

## 2012-01-09 LAB — PROTIME-INR
INR: 1.7 — ABNORMAL LOW (ref 2.00–3.50)
Protime: 20.4 Seconds — ABNORMAL HIGH (ref 10.6–13.4)

## 2012-01-09 NOTE — Telephone Encounter (Signed)
S/w pt that Dr Arline Asp stated it was ok to start revlimid today (2 wks on 1 wk off). CBC needs to be checked before next round of revlimid - that would be on Oct 31. POF sent to scheduler. Continue coumadin 6 mg daily.

## 2012-01-09 NOTE — Telephone Encounter (Signed)
called pt and she is aware of her 10/31 lab appt    aom

## 2012-01-21 ENCOUNTER — Other Ambulatory Visit: Payer: Self-pay | Admitting: Oncology

## 2012-01-21 ENCOUNTER — Telehealth: Payer: Self-pay | Admitting: Oncology

## 2012-01-21 ENCOUNTER — Other Ambulatory Visit: Payer: Self-pay | Admitting: *Deleted

## 2012-01-21 DIAGNOSIS — C9000 Multiple myeloma not having achieved remission: Secondary | ICD-10-CM

## 2012-01-21 MED ORDER — LENALIDOMIDE 10 MG PO CAPS: 10.0000 mg | ORAL_CAPSULE | Freq: Every day | ORAL | Status: DC

## 2012-01-21 NOTE — Telephone Encounter (Signed)
Talked to patient , appt was moved from MD to NP per Dr. Arline Asp, pt aware of appt change and also the appt time on 02/04/12

## 2012-01-22 ENCOUNTER — Telehealth: Payer: Self-pay | Admitting: *Deleted

## 2012-01-22 NOTE — Telephone Encounter (Signed)
Revlimid 10 mg was processed and will be shipped for delivery on 01/23/12

## 2012-01-22 NOTE — Telephone Encounter (Signed)
[facility] faxed confirmation of prescription shipment.  Per Rx Communication report, Revlimid was verified, copay amount is $0.00.  Prescription will be dispensed pending patient contact.delivery.

## 2012-01-30 ENCOUNTER — Other Ambulatory Visit (HOSPITAL_BASED_OUTPATIENT_CLINIC_OR_DEPARTMENT_OTHER): Payer: Medicare Other | Admitting: Lab

## 2012-01-30 DIAGNOSIS — D472 Monoclonal gammopathy: Secondary | ICD-10-CM

## 2012-01-30 LAB — BASIC METABOLIC PANEL (CC13)
BUN: 15 mg/dL (ref 7.0–26.0)
CO2: 26 mEq/L (ref 22–29)
Calcium: 9.5 mg/dL (ref 8.4–10.4)
Chloride: 109 mEq/L — ABNORMAL HIGH (ref 98–107)
Creatinine: 0.9 mg/dL (ref 0.6–1.1)
Glucose: 127 mg/dl — ABNORMAL HIGH (ref 70–99)
Potassium: 3.7 mEq/L (ref 3.5–5.1)
Sodium: 142 mEq/L (ref 136–145)

## 2012-02-04 ENCOUNTER — Other Ambulatory Visit: Payer: Medicare Other | Admitting: Lab

## 2012-02-04 ENCOUNTER — Ambulatory Visit (HOSPITAL_BASED_OUTPATIENT_CLINIC_OR_DEPARTMENT_OTHER): Payer: Medicare Other | Admitting: Family

## 2012-02-04 ENCOUNTER — Ambulatory Visit: Payer: Medicare Other | Admitting: Oncology

## 2012-02-04 ENCOUNTER — Ambulatory Visit (HOSPITAL_BASED_OUTPATIENT_CLINIC_OR_DEPARTMENT_OTHER): Payer: Medicare Other

## 2012-02-04 ENCOUNTER — Other Ambulatory Visit (HOSPITAL_BASED_OUTPATIENT_CLINIC_OR_DEPARTMENT_OTHER): Payer: Medicare Other | Admitting: Lab

## 2012-02-04 ENCOUNTER — Encounter: Payer: Self-pay | Admitting: Family

## 2012-02-04 ENCOUNTER — Telehealth: Payer: Self-pay | Admitting: Oncology

## 2012-02-04 VITALS — BP 151/76 | HR 66 | Temp 97.6°F | Resp 20 | Ht 64.5 in | Wt 170.0 lb

## 2012-02-04 DIAGNOSIS — C9000 Multiple myeloma not having achieved remission: Secondary | ICD-10-CM

## 2012-02-04 DIAGNOSIS — Z7901 Long term (current) use of anticoagulants: Secondary | ICD-10-CM

## 2012-02-04 LAB — PROTIME-INR
INR: 1.4 — ABNORMAL LOW (ref 2.00–3.50)
Protime: 16.8 Seconds — ABNORMAL HIGH (ref 10.6–13.4)

## 2012-02-04 LAB — CBC WITH DIFFERENTIAL/PLATELET
BASO%: 0.2 % (ref 0.0–2.0)
Basophils Absolute: 0 10*3/uL (ref 0.0–0.1)
EOS%: 1 % (ref 0.0–7.0)
Eosinophils Absolute: 0 10*3/uL (ref 0.0–0.5)
HCT: 35.7 % (ref 34.8–46.6)
HGB: 11.5 g/dL — ABNORMAL LOW (ref 11.6–15.9)
LYMPH%: 50.2 % — ABNORMAL HIGH (ref 14.0–49.7)
MCH: 31.4 pg (ref 25.1–34.0)
MCHC: 32.2 g/dL (ref 31.5–36.0)
MCV: 97.5 fL (ref 79.5–101.0)
MONO#: 0.3 10*3/uL (ref 0.1–0.9)
MONO%: 8.2 % (ref 0.0–14.0)
NEUT#: 1.7 10*3/uL (ref 1.5–6.5)
NEUT%: 40.4 % (ref 38.4–76.8)
Platelets: 235 10*3/uL (ref 145–400)
RBC: 3.66 10*6/uL — ABNORMAL LOW (ref 3.70–5.45)
RDW: 14.5 % (ref 11.2–14.5)
WBC: 4.2 10*3/uL (ref 3.9–10.3)
lymph#: 2.1 10*3/uL (ref 0.9–3.3)
nRBC: 0 % (ref 0–0)

## 2012-02-04 LAB — COMPREHENSIVE METABOLIC PANEL (CC13)
ALT: 15 U/L (ref 0–55)
AST: 15 U/L (ref 5–34)
Albumin: 3.8 g/dL (ref 3.5–5.0)
Alkaline Phosphatase: 65 U/L (ref 40–150)
BUN: 17 mg/dL (ref 7.0–26.0)
CO2: 26 mEq/L (ref 22–29)
Calcium: 9.5 mg/dL (ref 8.4–10.4)
Chloride: 107 mEq/L (ref 98–107)
Creatinine: 0.9 mg/dL (ref 0.6–1.1)
Glucose: 87 mg/dl (ref 70–99)
Potassium: 4 mEq/L (ref 3.5–5.1)
Sodium: 139 mEq/L (ref 136–145)
Total Bilirubin: 0.29 mg/dL (ref 0.20–1.20)
Total Protein: 7.1 g/dL (ref 6.4–8.3)

## 2012-02-04 LAB — PROTEIN / CREATININE RATIO, URINE
Creatinine, Urine: 171.3 mg/dL
Protein Creatinine Ratio: 0.04 (ref <0.15)
Total Protein, Urine: 6 mg/dL

## 2012-02-04 LAB — LACTATE DEHYDROGENASE (CC13): LDH: 203 U/L (ref 125–220)

## 2012-02-04 MED ORDER — ZOLEDRONIC ACID 4 MG/5ML IV CONC
4.0000 mg | Freq: Once | INTRAVENOUS | Status: AC
  Administered 2012-02-04: 4 mg via INTRAVENOUS
  Filled 2012-02-04: qty 5

## 2012-02-04 MED ORDER — OXYCODONE HCL 5 MG PO TABS: 5.0000 mg | ORAL_TABLET | Freq: Four times a day (QID) | ORAL | Status: DC | PRN

## 2012-02-04 NOTE — Telephone Encounter (Signed)
appts made and printed for pt aom °

## 2012-02-04 NOTE — Progress Notes (Signed)
Patient ID: Carmen Fisher, female   DOB: Jan 25, 1941, 71 y.o.   MRN: [id number] CSN: [id number]  CC: Carmen Candle, MD  Carmen Fisher. Carmen Fisher, [name]  Carmen Feinstein, MD   Problem List: Carmen Fisher is a 71 y.o. African-American female with a problem list consisting of:  1. Multiple myeloma diagnosed in August 2010. She has kappa light chain disease. In addition, she was found to have monoclonal IgG as well in her urine back at the time of diagnosis. Bone marrow initially showed 50% plasma cells. Metastatic bone survey carried out on March 30, 2009 was negative. Carmen Fisher was initially treated with Velcade, Decadron and Doxil for 4-1/2 cycles from 04/24/2009 through 07/27/2009. She developed peripheral neuropathy. Another bone marrow was carried out on 08/03/2009, showed 30% plasma cells. Revlimid and Decadron were given from 09/01/2009 through December 2011. Bone marrow on March 01, 2010 showed 5% plasma cells. 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 She has had an excellent response to that treatment. Since June 2012, she has been receiving maintenance Revlimid and Zometa. Serum immunofixation electrophoresis from 10/26/2010 was negative. Urine immunofixation electrophoresis from July 2012 continues to show monoclonal free kappa light chains. Total urine protein was 125 mg  at that time. Back at the time of diagnosis in December 2010, the 24-hour urine protein was over 8 g. The patient's most recent metastatic bone survey carried out at [facility] on 02/07/2011 was negative.  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 Symptoms developed in April 2011. The patient is currently on Neurontin.  3. Hypertension 4. GERD 5. Dyslipidemia  6. Glaucoma  Pneumovax was given in July 2012. Flu shot was given on February 12, 2011. She will speak to her PCP on 02/17/2012 about getting the influenza vaccination. Inoculations following high-dose  therapy and autologous stem cell infusion were given on May 05, 2011.  Carmen Fisher was seen today by Dr. Arline Fisher and I for follow up of her kappa light chain multiple myeloma diagnosed in December 2010. Carmen Fisher was last seen by [place] on 11/15/2011, at which time she received Zometa 4 mg IV. The patient continues to take Revlimid 10 mg daily, 2 weeks on, 1 week off.  There was confusion about when the patient was suppose to resume taking her Revlimid again after 01/30/2012, subsequently she remained off of the medication from 01/30/2012 - 02/03/2012.  She was asked to restart taking Revlimid today 02/04/2012.  The patient also takes Coumadin 6 mg daily for prophylaxis. We try to maintain her INR somewhere around 1.5. The patient is receiving Zometa every 3 months. Zometa was last administered on 11/15/2011. The patient tolerates these treatments well. Her neuropathy is about the same. She takes Neurontin. Neuropathy is markedly improved from a few years ago during the time that she was taking the Velcade. She was prescribed Premarin cream and uses this twice weekly. There have been no significant changes in the patient's condition over the past 3 months since her last visit here on 11/15/2011.  She does mention occasional left leg cramping at night in addition to insomnia.  She has begun taking melatonin for her insomnia and states that this has improved her rest.  Carmen Fisher denies any other symptomatology today including fever, chills, night sweats, unusual bleeding, N/V/D or constipation.  The patient stated that her Roxicodone has expired and she would like another prescription.  A prescription for Roxicodone 5 mg PO Q6, PRN #30 was given to  the patient today.   Past Medical History: Past Medical History  Diagnosis Date  . Multiple myeloma, without mention of having achieved remission(203.00)   . Anemia, unspecified   . Hypertension   . GERD (gastroesophageal reflux disease)   . Peripheral  neuropathy   . Dyslipidemia   . Glaucoma(365)     Surgical History: Past Surgical History  Procedure Date  . Appendectomy   . Abdominal hysterectomy 1994    w/BSO    Current Medications: Current Outpatient Prescriptions  Medication Sig Dispense Refill  . acetaminophen (TYLENOL) 500 MG tablet Take 500 mg by mouth every 6 (six) hours as needed.        Marland Kitchen allopurinol (ZYLOPRIM) 300 MG tablet TAKE ONE TABLET BY MOUTH EVERY DAY  30 tablet  6  . Alum & Mag Hydroxide-Simeth (MAGIC MOUTHWASH) SOLN Take 5 mLs by mouth as needed.        . Cyanocobalamin (VITAMIN B-12 SL) Place 3,000 mcg under the tongue as needed.       . gabapentin (NEURONTIN) 600 MG tablet TAKE ONE TABLET BY MOUTH TWICE DAILY  120 tablet  1  . latanoprost (XALATAN) 0.005 % ophthalmic solution 1 drop at bedtime.        Marland Kitchen lenalidomide (REVLIMID) 10 MG capsule Take 1 capsule (10 mg total) by mouth daily. TAKE ONE 10 mg CAPSULE DAILY BY MOUTH FOR 14 DAYS THEN REST 7 Days.  14 capsule  0  . LORazepam (ATIVAN) 1 MG tablet Take 1 mg by mouth every 4 (four) hours as needed.        Marland Kitchen oxyCODONE (OXY IR/ROXICODONE) 5 MG immediate release tablet Take 1 tablet (5 mg total) by mouth every 6 (six) hours as needed.  30 tablet  0  . pantoprazole (PROTONIX) 40 MG tablet TAKE ONE TABLET BY MOUTH EVERY DAY  30 tablet  6  . potassium chloride SA (KLOR-CON M20) 20 MEQ tablet       . prochlorperazine (COMPAZINE) 10 MG tablet Take 10 mg by mouth every 6 (six) hours as needed.        . timolol (BETIMOL) 0.5 % ophthalmic solution 1 drop 2 (two) times daily.        Marland Kitchen VITAMIN D, ERGOCALCIFEROL, PO Take 1,000 Units by mouth daily.        Marland Kitchen warfarin (COUMADIN) 6 MG tablet TAKE ONE TABLET BY MOUTH EVERY DAY  30 tablet  2    Allergies: Allergies  Allergen Reactions  . Aspirin Anaphylaxis  . Sulfa Antibiotics Hives    Family History: Family History  Problem Relation Age of Onset  . Stroke Mother   . Cancer Father     throat  . Cancer Sister      breast  . Sickle cell trait Sister     Social History: History  Substance Use Topics  . Smoking status: Never Smoker   . Smokeless tobacco: Never Used  . Alcohol Use: Yes     Comment: Occasionally    Review of Systems: 10 Point review of systems was completed and is negative except as noted above.   Physical Exam:   Blood pressure 151/76, pulse 66, temperature 97.6 F (36.4 C), temperature source Oral, resp. rate 20, height 5' 4.5" (1.638 m), weight 170 lb (77.111 kg).  General appearance: Alert, cooperative, well nourished, no apparent distress Head: Normocephalic, without obvious abnormality, atraumatic Eyes: Conjunctivae/corneas clear, arcus senilis, PERRLA, EOMI Nose: Nares, septum and mucosa are normal, no drainage or sinus tenderness Neck:  No adenopathy, supple, symmetrical, trachea midline, thyroid not enlarged, no tenderness Resp: Clear to auscultation bilaterally Cardio: Regular rate and rhythm, S1, S2 normal, no murmur, click, rub or gallop GI: Soft, distended, non-tender, hypoactive bowel sounds, no organomegaly Extremities: Extremities normal, atraumatic, no cyanosis or edema Lymph nodes: Cervical, supraclavicular, and axillary nodes normal Neurologic: Grossly normal   Laboratory Data: Results for orders placed in visit on 02/04/12 (from the past 48 hour(s))  CBC WITH DIFFERENTIAL     Status: Abnormal   Collection Time   02/04/12 10:06 AM      Component Value Range Comment   WBC 4.2  3.9 - 10.3 10e3/uL    NEUT# 1.7  1.5 - 6.5 10e3/uL    HGB 11.5 (*) 11.6 - 15.9 g/dL    HCT 27.2  53.6 - 64.4 %    Platelets 235  145 - 400 10e3/uL    MCV 97.5  79.5 - 101.0 fL    MCH 31.4  25.1 - 34.0 pg    MCHC 32.2  31.5 - 36.0 g/dL    RBC 0.34 (*) 7.42 - 5.45 10e6/uL    RDW 14.5  11.2 - 14.5 %    lymph# 2.1  0.9 - 3.3 10e3/uL    MONO# 0.3  0.1 - 0.9 10e3/uL    Eosinophils Absolute 0.0  0.0 - 0.5 10e3/uL    Basophils Absolute 0.0  0.0 - 0.1 10e3/uL    NEUT% 40.4  38.4 -  76.8 %    LYMPH% 50.2 (*) 14.0 - 49.7 %    MONO% 8.2  0.0 - 14.0 %    EOS% 1.0  0.0 - 7.0 %    BASO% 0.2  0.0 - 2.0 %    nRBC 0  0 - 0 %   PROTIME-INR     Status: Abnormal   Collection Time   02/04/12 10:06 AM      Component Value Range Comment   Protime 16.8 (*) 10.6 - 13.4 Seconds    INR 1.40 (*) 2.00 - 3.50    Lovenox No        Imaging Studies: 1. Chest x-ray, 2 view, from 03/21/2010 showed a tortuous aorta and scoliosis, otherwise negative.  2. Metastatic bone survey was carried out at [facility] on 02/07/2011. This was negative. The report has been scanned into the Epic system.    Impression/Plan: Carmen Fisher continues to do well, now 3 years from the time of diagnosis in August 2010 and about a year and a half from the time of high-dose therapy and autologous stem cell infusion. Carmen Fisher will receive Zometa today, 4 mg IV.  We are giving this every 3 months. The patient will continue with Revlimid 10 mg daily, 2 weeks on, 1 week off starting today 02/04/2012.  She will remain on Coumadin for prophylaxis, currently 6 mg daily. We will check CBC and PT/INR every 3 weeks (02/25/2012, 03/17/2012 and 04/07/2012).  We will plan to see Carmen Fisher again around 04/28/2012, and she will be due for another dose of IV Zometa 4 mg on 05/19/2012. We will check CBC, chemistries, and urine protein to creatinine ratio at the time of her office visit.  She is scheduled to receive a metastatic bone survey this week on 02/06/2012 and she will also complete a 24 hour urine this week.  It will be recalled that she did have a metastatic bone survey at [facility] on 02/07/2011 and apparently that was negative.  Carmen Fisher is asked to contact  us in the interim if she has any questions or concerns.    Larina Bras, NP-C 02/04/2012, 10:13 AM

## 2012-02-04 NOTE — Patient Instructions (Addendum)
Please call us if you have any questions or concerns. Restart Revlimid today 02/04/2012

## 2012-02-06 ENCOUNTER — Ambulatory Visit (HOSPITAL_COMMUNITY)
Admission: RE | Admit: 2012-02-06 | Discharge: 2012-02-06 | Disposition: A | Discharge status: Home / Self Care | Payer: Medicare Other | Source: Ambulatory Visit | Attending: Oncology | Admitting: Oncology

## 2012-02-06 ENCOUNTER — Other Ambulatory Visit: Payer: Self-pay | Admitting: *Deleted

## 2012-02-06 ENCOUNTER — Other Ambulatory Visit (HOSPITAL_BASED_OUTPATIENT_CLINIC_OR_DEPARTMENT_OTHER): Payer: Medicare Other | Admitting: Lab

## 2012-02-06 DIAGNOSIS — C9000 Multiple myeloma not having achieved remission: Secondary | ICD-10-CM

## 2012-02-06 MED ORDER — LENALIDOMIDE 10 MG PO CAPS: 10.0000 mg | ORAL_CAPSULE | Freq: Every day | ORAL | Status: DC

## 2012-02-07 ENCOUNTER — Telehealth: Payer: Self-pay

## 2012-02-07 NOTE — Telephone Encounter (Signed)
RECEIVED A FAX FROM [facility] CONCERNING A CONFIRMATION PRESCRIPTION BENEFITS.

## 2012-02-07 NOTE — Telephone Encounter (Signed)
RECEIVED A FAX FROM [facility] CONCERNING A CONFIRMATION OF PRESCRIPTION SHIPMENT FOR REVLIMID ON 02/13/12.

## 2012-02-07 NOTE — Telephone Encounter (Signed)
S/w pt that her met bone scan was OK per Dr Arline Asp. She expressed thanks for call.

## 2012-02-10 ENCOUNTER — Encounter: Payer: Self-pay | Admitting: Oncology

## 2012-02-10 LAB — UIFE/LIGHT CHAINS/TP QN, 24-HR UR
Albumin, U: DETECTED
Alpha 1, Urine: DETECTED — AB
Alpha 2, Urine: DETECTED — AB
Beta, Urine: DETECTED — AB
Free Kappa Lt Chains,Ur: 7.97 mg/dL — ABNORMAL HIGH (ref 0.14–2.42)
Free Kappa/Lambda Ratio: 31.88 ratio — ABNORMAL HIGH (ref 2.04–10.37)
Free Lambda Excretion/Day: 3.88 mg/d
Free Lambda Lt Chains,Ur: 0.25 mg/dL (ref 0.02–0.67)
Free Lt Chn Excr Rate: 123.54 mg/d
Gamma Globulin, Urine: DETECTED — AB
Time: 24 hours
Total Protein, Urine-Ur/day: 704 mg/d — ABNORMAL HIGH (ref 10–140)
Total Protein, Urine: 45.4 mg/dL
Volume, Urine: 1550 mL

## 2012-02-10 NOTE — Progress Notes (Signed)
We have a 24-hour urine collection from 02/06/2012. Protein was 704 mg as compared with 218 mg on 07/30/2011, 125 mg on 04/19/2011 and 62 mg on 07/10/2010.  Kappa lambda ratio was 31.88 and the UI FE showed monoclonal kappa light chains. Urine protein to creatinine ratio was 0.04 on 02/04/2012.

## 2012-02-11 ENCOUNTER — Encounter: Payer: Self-pay | Admitting: Oncology

## 2012-02-11 LAB — IMMUNOFIXATION ELECTROPHORESIS
IgA: 187 mg/dL (ref 69–380)
IgG (Immunoglobin G), Serum: 1530 mg/dL (ref 690–1700)
IgM, Serum: 32 mg/dL — ABNORMAL LOW (ref 52–322)
Total Protein, Serum Electrophoresis: 7 g/dL (ref 6.0–8.3)

## 2012-02-11 NOTE — Progress Notes (Signed)
Serum immunofixation electrophoresis showed no monoclonal protein.  IgG was 1530, IgA was 187, and IgM was 32 with normal being 52-322.

## 2012-02-12 ENCOUNTER — Telehealth: Payer: Self-pay | Admitting: *Deleted

## 2012-02-12 NOTE — Telephone Encounter (Signed)
Per staff message and POF I have scheduled appt.  JMW  

## 2012-02-13 ENCOUNTER — Other Ambulatory Visit (HOSPITAL_COMMUNITY): Payer: Self-pay | Admitting: Oncology

## 2012-02-25 ENCOUNTER — Other Ambulatory Visit (HOSPITAL_BASED_OUTPATIENT_CLINIC_OR_DEPARTMENT_OTHER): Payer: Medicare Other

## 2012-02-25 DIAGNOSIS — Z7901 Long term (current) use of anticoagulants: Secondary | ICD-10-CM

## 2012-02-25 DIAGNOSIS — C9000 Multiple myeloma not having achieved remission: Secondary | ICD-10-CM

## 2012-02-25 LAB — CBC WITH DIFFERENTIAL/PLATELET
BASO%: 0.6 % (ref 0.0–2.0)
Basophils Absolute: 0 10*3/uL (ref 0.0–0.1)
EOS%: 2.3 % (ref 0.0–7.0)
Eosinophils Absolute: 0.1 10*3/uL (ref 0.0–0.5)
HCT: 34.6 % — ABNORMAL LOW (ref 34.8–46.6)
HGB: 11.4 g/dL — ABNORMAL LOW (ref 11.6–15.9)
LYMPH%: 46.1 % (ref 14.0–49.7)
MCH: 32.7 pg (ref 25.1–34.0)
MCHC: 32.8 g/dL (ref 31.5–36.0)
MCV: 99.5 fL (ref 79.5–101.0)
MONO#: 0.4 10*3/uL (ref 0.1–0.9)
MONO%: 10.6 % (ref 0.0–14.0)
NEUT#: 1.6 10*3/uL (ref 1.5–6.5)
NEUT%: 40.4 % (ref 38.4–76.8)
Platelets: 204 10*3/uL (ref 145–400)
RBC: 3.48 10*6/uL — ABNORMAL LOW (ref 3.70–5.45)
RDW: 15.3 % — ABNORMAL HIGH (ref 11.2–14.5)
WBC: 4 10*3/uL (ref 3.9–10.3)
lymph#: 1.9 10*3/uL (ref 0.9–3.3)

## 2012-02-25 LAB — BASIC METABOLIC PANEL (CC13)
BUN: 10 mg/dL (ref 7.0–26.0)
CO2: 27 mEq/L (ref 22–29)
Calcium: 8.8 mg/dL (ref 8.4–10.4)
Chloride: 110 mEq/L — ABNORMAL HIGH (ref 98–107)
Creatinine: 0.9 mg/dL (ref 0.6–1.1)
Glucose: 94 mg/dl (ref 70–99)
Potassium: 3.6 mEq/L (ref 3.5–5.1)
Sodium: 144 mEq/L (ref 136–145)

## 2012-02-25 LAB — PROTIME-INR
INR: 1.8 — ABNORMAL LOW (ref 2.00–3.50)
Protime: 21.6 Seconds — ABNORMAL HIGH (ref 10.6–13.4)

## 2012-03-01 ENCOUNTER — Other Ambulatory Visit: Payer: Self-pay | Admitting: Oncology

## 2012-03-02 ENCOUNTER — Other Ambulatory Visit: Payer: Self-pay | Admitting: *Deleted

## 2012-03-02 DIAGNOSIS — C9000 Multiple myeloma not having achieved remission: Secondary | ICD-10-CM

## 2012-03-02 NOTE — Telephone Encounter (Signed)
THIS REQUEST FOR REVLIMID WAS GIVEN TO DR.MURINSON'S NURSE, ROBIN BASS,RN.

## 2012-03-03 MED ORDER — LENALIDOMIDE 10 MG PO CAPS: 10.0000 mg | ORAL_CAPSULE | Freq: Every day | ORAL | Status: DC

## 2012-03-03 NOTE — Addendum Note (Signed)
Addended by: Arvilla Meres on: 03/03/2012 12:02 PM   Modules accepted: Orders

## 2012-03-05 ENCOUNTER — Other Ambulatory Visit: Payer: Self-pay | Admitting: Medical Oncology

## 2012-03-05 MED ORDER — GABAPENTIN 600 MG PO TABS: 600.0000 mg | ORAL_TABLET | Freq: Two times a day (BID) | ORAL | Status: DC

## 2012-03-05 NOTE — Telephone Encounter (Signed)
[facility] faxed Rx communication report.  Revlimid prescription benefits verified.  Copay amt. Is $0.00.  Prescription has been processed by the pharmacy and will be shipped to patient for delivery on 03-05-2012.

## 2012-03-17 ENCOUNTER — Other Ambulatory Visit (HOSPITAL_BASED_OUTPATIENT_CLINIC_OR_DEPARTMENT_OTHER): Payer: Medicare Other | Admitting: Lab

## 2012-03-17 DIAGNOSIS — Z7901 Long term (current) use of anticoagulants: Secondary | ICD-10-CM

## 2012-03-17 DIAGNOSIS — C9000 Multiple myeloma not having achieved remission: Secondary | ICD-10-CM

## 2012-03-17 LAB — BASIC METABOLIC PANEL (CC13)
BUN: 11 mg/dL (ref 7.0–26.0)
CO2: 27 mEq/L (ref 22–29)
Calcium: 9.2 mg/dL (ref 8.4–10.4)
Chloride: 109 mEq/L — ABNORMAL HIGH (ref 98–107)
Creatinine: 1 mg/dL (ref 0.6–1.1)
Glucose: 118 mg/dl — ABNORMAL HIGH (ref 70–99)
Potassium: 4.1 mEq/L (ref 3.5–5.1)
Sodium: 144 mEq/L (ref 136–145)

## 2012-03-17 LAB — CBC WITH DIFFERENTIAL/PLATELET
BASO%: 1 % (ref 0.0–2.0)
Basophils Absolute: 0 10*3/uL (ref 0.0–0.1)
EOS%: 6 % (ref 0.0–7.0)
Eosinophils Absolute: 0.2 10*3/uL (ref 0.0–0.5)
HCT: 36.5 % (ref 34.8–46.6)
HGB: 11.9 g/dL (ref 11.6–15.9)
LYMPH%: 48.6 % (ref 14.0–49.7)
MCH: 32.4 pg (ref 25.1–34.0)
MCHC: 32.5 g/dL (ref 31.5–36.0)
MCV: 99.7 fL (ref 79.5–101.0)
MONO#: 0.3 10*3/uL (ref 0.1–0.9)
MONO%: 7.9 % (ref 0.0–14.0)
NEUT#: 1.3 10*3/uL — ABNORMAL LOW (ref 1.5–6.5)
NEUT%: 36.5 % — ABNORMAL LOW (ref 38.4–76.8)
Platelets: 186 10*3/uL (ref 145–400)
RBC: 3.66 10*6/uL — ABNORMAL LOW (ref 3.70–5.45)
RDW: 15.4 % — ABNORMAL HIGH (ref 11.2–14.5)
WBC: 3.7 10*3/uL — ABNORMAL LOW (ref 3.9–10.3)
lymph#: 1.8 10*3/uL (ref 0.9–3.3)

## 2012-03-17 LAB — PROTIME-INR
INR: 1.7 — ABNORMAL LOW (ref 2.00–3.50)
Protime: 20.4 Seconds — ABNORMAL HIGH (ref 10.6–13.4)

## 2012-03-19 ENCOUNTER — Other Ambulatory Visit: Payer: Self-pay | Admitting: *Deleted

## 2012-03-19 DIAGNOSIS — C9000 Multiple myeloma not having achieved remission: Secondary | ICD-10-CM

## 2012-03-19 MED ORDER — LENALIDOMIDE 10 MG PO CAPS: 10.0000 mg | ORAL_CAPSULE | Freq: Every day | ORAL | Status: DC

## 2012-03-19 NOTE — Telephone Encounter (Addendum)
THIS REFILL REQUEST FOR REVLIMID WAS GIVEN TO DR.MURINSON'S NURSE, KATHY BUYCK,RN. 

## 2012-03-19 NOTE — Addendum Note (Signed)
Addended by: Arvilla Meres on: 03/19/2012 04:52 PM   Modules accepted: Orders

## 2012-03-24 NOTE — Telephone Encounter (Signed)
RECEIVED A FAX FROM [facility] CONCERNING A CONFIRMATION OF PRESCRIPTION SHIPMENT FOR REVLIMID ON 03/24/12.

## 2012-04-07 ENCOUNTER — Other Ambulatory Visit (HOSPITAL_BASED_OUTPATIENT_CLINIC_OR_DEPARTMENT_OTHER): Payer: Medicare Other | Admitting: Lab

## 2012-04-07 DIAGNOSIS — C9000 Multiple myeloma not having achieved remission: Secondary | ICD-10-CM

## 2012-04-07 DIAGNOSIS — Z7901 Long term (current) use of anticoagulants: Secondary | ICD-10-CM

## 2012-04-07 LAB — CBC WITH DIFFERENTIAL/PLATELET
BASO%: 0.7 % (ref 0.0–2.0)
Basophils Absolute: 0 10*3/uL (ref 0.0–0.1)
EOS%: 2.1 % (ref 0.0–7.0)
Eosinophils Absolute: 0.1 10*3/uL (ref 0.0–0.5)
HCT: 36.9 % (ref 34.8–46.6)
HGB: 12.1 g/dL (ref 11.6–15.9)
LYMPH%: 41.3 % (ref 14.0–49.7)
MCH: 32.9 pg (ref 25.1–34.0)
MCHC: 32.8 g/dL (ref 31.5–36.0)
MCV: 100.1 fL (ref 79.5–101.0)
MONO#: 0.4 10*3/uL (ref 0.1–0.9)
MONO%: 10.1 % (ref 0.0–14.0)
NEUT#: 1.8 10*3/uL (ref 1.5–6.5)
NEUT%: 45.8 % (ref 38.4–76.8)
Platelets: 191 10*3/uL (ref 145–400)
RBC: 3.68 10*6/uL — ABNORMAL LOW (ref 3.70–5.45)
RDW: 15.6 % — ABNORMAL HIGH (ref 11.2–14.5)
WBC: 4 10*3/uL (ref 3.9–10.3)
lymph#: 1.7 10*3/uL (ref 0.9–3.3)

## 2012-04-07 LAB — PROTIME-INR
INR: 1.5 — ABNORMAL LOW (ref 2.00–3.50)
Protime: 18 Seconds — ABNORMAL HIGH (ref 10.6–13.4)

## 2012-04-07 LAB — BASIC METABOLIC PANEL (CC13)
BUN: 12 mg/dL (ref 7.0–26.0)
CO2: 27 mEq/L (ref 22–29)
Calcium: 9.3 mg/dL (ref 8.4–10.4)
Chloride: 109 mEq/L — ABNORMAL HIGH (ref 98–107)
Creatinine: 0.9 mg/dL (ref 0.6–1.1)
Glucose: 116 mg/dl — ABNORMAL HIGH (ref 70–99)
Potassium: 3.5 mEq/L (ref 3.5–5.1)
Sodium: 143 mEq/L (ref 136–145)

## 2012-04-15 ENCOUNTER — Encounter: Payer: Self-pay | Admitting: Medical Oncology

## 2012-04-15 ENCOUNTER — Other Ambulatory Visit: Payer: Self-pay | Admitting: Medical Oncology

## 2012-04-15 DIAGNOSIS — C9000 Multiple myeloma not having achieved remission: Secondary | ICD-10-CM

## 2012-04-15 MED ORDER — LENALIDOMIDE 10 MG PO CAPS: 10.0000 mg | ORAL_CAPSULE | Freq: Every day | ORAL | Status: DC

## 2012-04-16 NOTE — Telephone Encounter (Signed)
RECEIVED TWO FAXES FOR [facility]. THE FIRST FAX WAS CONCERNING PRESCRIPTION BENEFITS. PT.'S COPAY AMOUNT IS $0.00. THE SECOND FAX WAS CONCERNING A CONFIRMATION OF PRESCRIPTION SHIPMENT FOR REVLIMID ON 04/16/12.

## 2012-04-22 ENCOUNTER — Other Ambulatory Visit: Payer: Self-pay | Admitting: Oncology

## 2012-04-27 ENCOUNTER — Other Ambulatory Visit: Payer: Self-pay | Admitting: *Deleted

## 2012-04-27 DIAGNOSIS — C9000 Multiple myeloma not having achieved remission: Secondary | ICD-10-CM

## 2012-04-27 MED ORDER — LENALIDOMIDE 10 MG PO CAPS: 10.0000 mg | ORAL_CAPSULE | Freq: Every day | ORAL | Status: DC

## 2012-04-27 NOTE — Telephone Encounter (Signed)
THIS REFILL REQUEST FOR REVLIMID WAS GIVEN TO DR.MURINSON'S NURSE, ROBIN BASS,RN. 

## 2012-04-27 NOTE — Addendum Note (Signed)
Addended by: Laroy Apple E on: 04/27/2012 05:01 PM   Modules accepted: Orders

## 2012-04-28 ENCOUNTER — Telehealth: Payer: Self-pay | Admitting: Medical Oncology

## 2012-04-28 ENCOUNTER — Other Ambulatory Visit (HOSPITAL_BASED_OUTPATIENT_CLINIC_OR_DEPARTMENT_OTHER): Payer: Medicare Other | Admitting: Lab

## 2012-04-28 ENCOUNTER — Telehealth: Payer: Self-pay | Admitting: Oncology

## 2012-04-28 ENCOUNTER — Encounter: Payer: Self-pay | Admitting: Oncology

## 2012-04-28 ENCOUNTER — Telehealth: Payer: Self-pay | Admitting: *Deleted

## 2012-04-28 ENCOUNTER — Ambulatory Visit (HOSPITAL_BASED_OUTPATIENT_CLINIC_OR_DEPARTMENT_OTHER): Payer: Medicare Other | Admitting: Oncology

## 2012-04-28 ENCOUNTER — Ambulatory Visit (HOSPITAL_BASED_OUTPATIENT_CLINIC_OR_DEPARTMENT_OTHER): Payer: Medicare Other

## 2012-04-28 VITALS — BP 153/84 | HR 76 | Temp 96.9°F | Resp 18 | Ht 64.5 in | Wt 173.0 lb

## 2012-04-28 DIAGNOSIS — C9000 Multiple myeloma not having achieved remission: Secondary | ICD-10-CM

## 2012-04-28 DIAGNOSIS — G609 Hereditary and idiopathic neuropathy, unspecified: Secondary | ICD-10-CM

## 2012-04-28 DIAGNOSIS — Z7901 Long term (current) use of anticoagulants: Secondary | ICD-10-CM

## 2012-04-28 LAB — CBC WITH DIFFERENTIAL/PLATELET
BASO%: 0.6 % (ref 0.0–2.0)
Basophils Absolute: 0 10*3/uL (ref 0.0–0.1)
EOS%: 2.3 % (ref 0.0–7.0)
Eosinophils Absolute: 0.1 10*3/uL (ref 0.0–0.5)
HCT: 37.7 % (ref 34.8–46.6)
HGB: 11.9 g/dL (ref 11.6–15.9)
LYMPH%: 47.1 % (ref 14.0–49.7)
MCH: 31 pg (ref 25.1–34.0)
MCHC: 31.6 g/dL (ref 31.5–36.0)
MCV: 98.2 fL (ref 79.5–101.0)
MONO#: 0.5 10*3/uL (ref 0.1–0.9)
MONO%: 11 % (ref 0.0–14.0)
NEUT#: 1.8 10*3/uL (ref 1.5–6.5)
NEUT%: 39 % (ref 38.4–76.8)
Platelets: 195 10*3/uL (ref 145–400)
RBC: 3.84 10*6/uL (ref 3.70–5.45)
RDW: 14.6 % — ABNORMAL HIGH (ref 11.2–14.5)
WBC: 4.7 10*3/uL (ref 3.9–10.3)
lymph#: 2.2 10*3/uL (ref 0.9–3.3)
nRBC: 0 % (ref 0–0)

## 2012-04-28 LAB — COMPREHENSIVE METABOLIC PANEL (CC13)
ALT: 17 U/L (ref 0–55)
AST: 14 U/L (ref 5–34)
Albumin: 3.8 g/dL (ref 3.5–5.0)
Alkaline Phosphatase: 68 U/L (ref 40–150)
BUN: 12.4 mg/dL (ref 7.0–26.0)
CO2: 26 mEq/L (ref 22–29)
Calcium: 9.1 mg/dL (ref 8.4–10.4)
Chloride: 107 mEq/L (ref 98–107)
Creatinine: 0.9 mg/dL (ref 0.6–1.1)
Glucose: 86 mg/dl (ref 70–99)
Potassium: 3.6 mEq/L (ref 3.5–5.1)
Sodium: 141 mEq/L (ref 136–145)
Total Bilirubin: 0.29 mg/dL (ref 0.20–1.20)
Total Protein: 7.4 g/dL (ref 6.4–8.3)

## 2012-04-28 LAB — PROTEIN / CREATININE RATIO, URINE
Creatinine, Urine: 166.9 mg/dL
Protein Creatinine Ratio: 0.15 — ABNORMAL HIGH (ref <0.15)
Total Protein, Urine: 25 mg/dL

## 2012-04-28 LAB — PROTIME-INR
INR: 1.6 — ABNORMAL LOW (ref 2.00–3.50)
Protime: 19.2 Seconds — ABNORMAL HIGH (ref 10.6–13.4)

## 2012-04-28 LAB — LACTATE DEHYDROGENASE (CC13): LDH: 197 U/L (ref 125–245)

## 2012-04-28 MED ORDER — ZOLEDRONIC ACID 4 MG/5ML IV CONC
4.0000 mg | Freq: Once | INTRAVENOUS | Status: AC
  Administered 2012-04-28: 4 mg via INTRAVENOUS
  Filled 2012-04-28: qty 5

## 2012-04-28 NOTE — Telephone Encounter (Signed)
appts made and printed for pt aom,email to mw to add tx,pt aware    anne

## 2012-04-28 NOTE — Telephone Encounter (Signed)
RECEIVED A FAX FROM [facility] CONCERNING A CONFIRMATION OF FACSIMILE RECEIPT FOR PT. REFERRAL.

## 2012-04-28 NOTE — Telephone Encounter (Signed)
I called Gretchen-Dr. Lillia Abed assistant and asked her to send [place] notes from her last office visit in May.

## 2012-04-28 NOTE — Progress Notes (Signed)
This office note has been dictated.  #[id number]

## 2012-04-28 NOTE — Telephone Encounter (Signed)
Per staff message and POF I have scheduled appts.  JMW  

## 2012-04-28 NOTE — Progress Notes (Signed)
CC:   Carmen Candle, MD [name], [name] [name], MD  PROBLEM LIST:  1. Multiple myeloma diagnosed in August 2010. She has kappa light  chain disease. In addition, she was found to have monoclonal IgG  as well in her urine back at the time of diagnosis. Bone marrow  initially showed 50% plasma cells. Metastatic bone survey carried  out on March 30, 2009 was negative. Carmen Fisher was initially  treated with Velcade, Decadron and Doxil for 4-1/2 cycles from  04/24/2009 through 07/27/2009. She developed peripheral  neuropathy. Another bone marrow was carried out on 08/03/2009,  showed 30% plasma cells. Revlimid and Decadron were given from  09/01/2009 through December 2011. Bone marrow on March 01, 2010  showed 5% plasma cells. The patient then underwent high-dose  melphalan with autologous stem cell infusion on 04/26/2010. She  has had an excellent response to that treatment. Since June 2012,  she has been receiving maintenance Revlimid and Zometa. Serum  immunofixation electrophoresis from 10/26/2010 was negative. Urine  immunofixation electrophoresis from July 2012 continues to show  monoclonal free kappa light chains. Total urine protein was 125 mg  at that time. Back at the time of diagnosis in December 2010, the  24-hour urine protein was over 8 g. The patient's most recent  metastatic bone survey carried out at [facility] on 02/07/2011 was  negative. A 24 hour urine collection from 02/06/2012 yielded a protein of 704 mg as compared with 218 mg on 07/30/2011, 125 mg on 04/19/2011 and 62 mg on 07/10/2010. The free kappa light chain excretion was 123.5 mg suggesting that most of the protein is not the kappa light chain.   The urine kappa to lambda ratio was 31.88 and the urine immunofixation electrophoresis was positive for monoclonal kappa light chains.  Urine protein to creatinine ratio was 0.04.  Serum immunofixation electrophoresis from 02/06/2012 showed no  monoclonal protein.  IgG level was 1530, IgA level 187 and IgM level 32, the latter being low.  Metastatic bone survey from 02/06/2012 showed no evidence for metastatic disease.  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 Symptoms  developed in April 2011. The patient is currently on Neurontin.  3. Hypertension.  4. GERD.  5. Dyslipidemia.  6. Glaucoma.     MEDICATIONS:  Reviewed and recorded. Current Outpatient Prescriptions  Medication Sig Dispense Refill  . acetaminophen (TYLENOL) 500 MG tablet Take 500 mg by mouth every 6 (six) hours as needed.        Marland Kitchen allopurinol (ZYLOPRIM) 300 MG tablet TAKE ONE TABLET BY MOUTH EVERY DAY  30 tablet  5  . Alum & Mag Hydroxide-Simeth (MAGIC MOUTHWASH) SOLN Take 5 mLs by mouth as needed.        . Cyanocobalamin (VITAMIN B-12 SL) Place 3,000 mcg under the tongue as needed.       . gabapentin (NEURONTIN) 600 MG tablet Take 1 tablet (600 mg total) by mouth 2 (two) times daily.  120 tablet  3  . latanoprost (XALATAN) 0.005 % ophthalmic solution 1 drop at bedtime.        Marland Kitchen lenalidomide (REVLIMID) 10 MG capsule Take 1 capsule (10 mg total) by mouth daily. TAKE ONE 10 mg CAPSULE DAILY BY MOUTH FOR 14 DAYS THEN REST 7 Days.  14 capsule  0  . LORazepam (ATIVAN) 1 MG tablet Take 1 mg by mouth every 4 (four) hours as needed.        Marland Kitchen oxyCODONE (OXY IR/ROXICODONE) 5 MG immediate release tablet  Take 1 tablet (5 mg total) by mouth every 6 (six) hours as needed.  30 tablet  0  . pantoprazole (PROTONIX) 40 MG tablet TAKE ONE TABLET BY MOUTH EVERY DAY  30 tablet  5  . potassium chloride SA (KLOR-CON M20) 20 MEQ tablet       . prochlorperazine (COMPAZINE) 10 MG tablet Take 10 mg by mouth every 6 (six) hours as needed.        . timolol (BETIMOL) 0.5 % ophthalmic solution 1 drop 2 (two) times daily.        Marland Kitchen VITAMIN D, ERGOCALCIFEROL, PO Take 1,000 Units by mouth 2 (two) times daily.       Marland Kitchen warfarin (COUMADIN) 6 MG tablet TAKE ONE TABLET BY MOUTH EVERY  DAY  30 tablet  1   1. Revlimid 10 mg daily, 2 weeks on, 1 week off.  Revlimid was started     on 09/24/2010. 2. Zometa 4 mg IV currently being given every 3 months.  This was     started on 09/28/2010 and initially was given monthly. 3. Coumadin 6 mg daily to maintain an INR of approximately 1.5.  This     is being followed by [facility].    IMMUNIZATIONS: Pneumovax was given in July 2012.  Flu shot was given on February 12, 2011.  Inoculations following high-dose therapy and autologous stem cell  infusion were given on May 05, 2011.    SMOKING HISTORY:  The patient has never smoked cigarettes.    HISTORY:  I saw Carmen Fisher today for followup of her kappa light chain multiple myeloma diagnosed in December 2010.  Carmen Fisher was last seen by [place] on 02/04/2012 and prior to that on 11/15/2011.  Clinically, the patient continues to do well.  Her neuropathy has improved tremendously. She does have some minor low back pain for which she takes Tylenol.  No bleeding or bruising problems related to the Coumadin.  She is tolerating her Revlimid okay.  No problems with her Zometa infusions which are occurring every 3 months.  The patient tells me she was seen at [facility] in May.  In going through our records I do not think I have received a copy of that note.  We will try to obtain a copy of that report.  The patient is due to be seen again in May of 2014.  Carmen Fisher tells me that she will be going on a cruise for about 5 days with her daughter and sisters.  She will be going to the [place].  The patient has done well since her last visit, is without complaints Today.   PHYSICAL EXAMINATION:  She looks well.  Weight is 173 pounds.  Height 5 feet 4-1/2 inches.  Body surface area 1.89 sq m.  Blood pressure 153/84. Other vital signs are normal. HEENT: There is no scleral icterus. Mouth and pharynx are benign. Lymph: No peripheral adenopathy palpable. Heart and Lungs:  Normal. Breasts: Not examined. The patient does obtain regular mammograms.  Abdomen: Benign. No skeletal tenderness. Extremities: No peripheral  edema. Neurologic Exam: Normal. The patient does not have a Port-A-  Cath or central catheter.   LABORATORY DATA:  White count 4.7, ANC 1.8, hemoglobin 11.9, hematocrit 37.7, platelets 195,000.  Chemistries from today and also urine protein to creatinine ratio are pending.  We have a BMET from 04/07/2012 that was normal.  Chemistries from 02/04/2012 also were completely normal. BUN 17.0, creatinine 0.9.  On 02/06/2012 IgG  level was 1530, IgA 187 and IgM was low at 32.  Serum immunofixation electrophoresis was negative for monoclonal protein.  A 24 hour urine collection on 02/06/2012 yielded 704 mg of protein as compared with 218 mg of protein on 07/30/2011.  The free kappa light chain excretion was 123.5 mg suggesting that most of the protein is not the kappa light chain.  Kappa lambda ratio was 31.88.  On 07/30/2011 the free kappa light chain excretion was 200 mg out of a total of 218 mg.  The kappa lambda ratio was 37.04 at that time.  The urine protein to creatinine ratio on 02/04/2012 was 0.04.  Pro-time today was 19.2 with an INR of 1.60. Urine immunofixation electrophoresis from 04/08/2011 was positive for kappa light chains.  IMAGING STUDIES:  1. Chest x-ray, 2 view, from 03/21/2010 showed a tortuous aorta and  scoliosis, otherwise negative.  2. Metastatic bone survey was carried out at [facility] on 02/07/2011. This  was negative. The report has been scanned into the Epic system.  3. Metastatic bone survey from 02/06/2012 showed no evidence for metastatic disease.  There were no lytic lesions suggestive of multiple myeloma.   IMPRESSION AND PLAN:  Carmen Fisher continues to do well, now over 3 years from the time of diagnosis, which occurred in August 2010.  She is about 2 years out from the time of high-dose melphalan and autologous  stem cell infusion.  The patient is due for Zometa today 4 mg IV.  She receives this every 3 months.  She will continue with her Revlimid.  She is due to finish this up the end of the week.  She thinks she started this somewhere around January 20.  As stated, she takes this 2 weeks on/1 week off.  We have been checking CBC and pro-times about every 3-4 weeks.  We will continue with this.  Because of the increase in the urine protein value, I would like to check another 24 hour urine collection for protein and other urine protein electrophoresis.  We will plan to see Carmen Fisher again in 3 months which will be late April, at which time we will check CBC, chemistries, urine protein to creatinine ratio, quantitative immunoglobulins and serum for light chains.  We will also get a pro-time as the patient remains on Coumadin 6 mg daily.  As stated above we will check CBC and pro-time about every 3-4 weeks.  Coumadin is being followed by the [facility].  Will try to obtain the report from Dr. Barbaraann Boys from the patient's last visit at [facility] in May 2013.  She will apparently be seeing Dr. Barbaraann Boys again this coming May.    ______________________________ Samul Dada, [name] DSM/MEDQ  D:  04/28/2012  T:  04/28/2012  Job:  [id number]

## 2012-04-30 ENCOUNTER — Ambulatory Visit: Payer: Medicare Other | Admitting: Lab

## 2012-04-30 DIAGNOSIS — C9000 Multiple myeloma not having achieved remission: Secondary | ICD-10-CM

## 2012-04-30 NOTE — Telephone Encounter (Signed)
RECEIVED A FAX FROM [facility]. PT.'S PRESCRIPTION IS TOO SOON TO FILL PER PT.'S INSURANCE PLAN. RX CAN BE FILLED AND WILL BE PROCESSED ON 05/01/12.

## 2012-05-01 NOTE — Telephone Encounter (Signed)
RECEIVED A FAX FROM [facility] CONCERNING A CONFIRMATION OF PT.'S PRESCRIPTION BENEFITS. COPAY AMOUNT IS ZERO. 

## 2012-05-04 ENCOUNTER — Encounter: Payer: Self-pay | Admitting: Oncology

## 2012-05-04 LAB — UIFE/LIGHT CHAINS/TP QN, 24-HR UR
Albumin, U: DETECTED
Alpha 1, Urine: DETECTED — AB
Alpha 2, Urine: DETECTED — AB
Beta, Urine: DETECTED — AB
Free Kappa Lt Chains,Ur: 15.2 mg/dL — ABNORMAL HIGH (ref 0.14–2.42)
Free Kappa/Lambda Ratio: 19.49 ratio — ABNORMAL HIGH (ref 2.04–10.37)
Free Lambda Excretion/Day: 15.6 mg/d
Free Lambda Lt Chains,Ur: 0.78 mg/dL — ABNORMAL HIGH (ref 0.02–0.67)
Free Lt Chn Excr Rate: 304 mg/d
Gamma Globulin, Urine: DETECTED — AB
Time: 24 hours
Total Protein, Urine-Ur/day: 342 mg/d — ABNORMAL HIGH (ref 10–140)
Total Protein, Urine: 17.1 mg/dL
Volume, Urine: 2000 mL

## 2012-05-04 NOTE — Progress Notes (Signed)
A 24-hour urine collection from 04/30/2012 yielded 342 mg of protein, of which 304 mg were monoclonal kappa light chains. Urine immunofixation electrophoresis was positive for monoclonal free kappa light chains.  On 02/06/2012, a 24-hour urine collection yielded 704 mg of protein, of which 123.54 mg were kappa light chains.

## 2012-05-05 NOTE — Telephone Encounter (Signed)
RECEIVED A FAX FROM [facility] CONCERNING A CONFIRMATION OF PRESCRIPTION SHIPMENT FOR REVLIMID ON 05/04/12.

## 2012-05-18 ENCOUNTER — Telehealth: Payer: Self-pay | Admitting: Medical Oncology

## 2012-05-18 ENCOUNTER — Other Ambulatory Visit (HOSPITAL_BASED_OUTPATIENT_CLINIC_OR_DEPARTMENT_OTHER): Payer: Medicare Other

## 2012-05-18 DIAGNOSIS — C9000 Multiple myeloma not having achieved remission: Secondary | ICD-10-CM

## 2012-05-18 DIAGNOSIS — Z7901 Long term (current) use of anticoagulants: Secondary | ICD-10-CM

## 2012-05-18 LAB — BASIC METABOLIC PANEL (CC13)
BUN: 8.2 mg/dL (ref 7.0–26.0)
CO2: 25 mEq/L (ref 22–29)
Calcium: 9.3 mg/dL (ref 8.4–10.4)
Chloride: 107 mEq/L (ref 98–107)
Creatinine: 1 mg/dL (ref 0.6–1.1)
Glucose: 108 mg/dl — ABNORMAL HIGH (ref 70–99)
Potassium: 3.4 mEq/L — ABNORMAL LOW (ref 3.5–5.1)
Sodium: 141 mEq/L (ref 136–145)

## 2012-05-18 LAB — CBC WITH DIFFERENTIAL/PLATELET
BASO%: 0.4 % (ref 0.0–2.0)
Basophils Absolute: 0 10*3/uL (ref 0.0–0.1)
EOS%: 0.7 % (ref 0.0–7.0)
Eosinophils Absolute: 0 10*3/uL (ref 0.0–0.5)
HCT: 34.7 % — ABNORMAL LOW (ref 34.8–46.6)
HGB: 11.4 g/dL — ABNORMAL LOW (ref 11.6–15.9)
LYMPH%: 23.6 % (ref 14.0–49.7)
MCH: 32.4 pg (ref 25.1–34.0)
MCHC: 33 g/dL (ref 31.5–36.0)
MCV: 98.2 fL (ref 79.5–101.0)
MONO#: 0.5 10*3/uL (ref 0.1–0.9)
MONO%: 8.9 % (ref 0.0–14.0)
NEUT#: 3.6 10*3/uL (ref 1.5–6.5)
NEUT%: 66.4 % (ref 38.4–76.8)
Platelets: 191 10*3/uL (ref 145–400)
RBC: 3.53 10*6/uL — ABNORMAL LOW (ref 3.70–5.45)
RDW: 15.2 % — ABNORMAL HIGH (ref 11.2–14.5)
WBC: 5.4 10*3/uL (ref 3.9–10.3)
lymph#: 1.3 10*3/uL (ref 0.9–3.3)

## 2012-05-18 LAB — PROTIME-INR
INR: 1.5 — ABNORMAL LOW (ref 2.00–3.50)
Protime: 18 Seconds — ABNORMAL HIGH (ref 10.6–13.4)

## 2012-05-18 NOTE — Telephone Encounter (Signed)
I called pt to confirm that she is still taking her K-Dur 20 meq daily. She states that she is. I told her her potassium is 3.4 and to eat some foods rich in potassium. I gave her some examples... Dried apricots,prunes, bananas etc. She voiced understanding.

## 2012-05-19 ENCOUNTER — Other Ambulatory Visit: Payer: Self-pay | Admitting: *Deleted

## 2012-05-19 DIAGNOSIS — C9000 Multiple myeloma not having achieved remission: Secondary | ICD-10-CM

## 2012-05-19 NOTE — Telephone Encounter (Signed)
THIS REFILL REQUEST FOR REVLIMID WAS GIVEN TO DR.MURINSON'S NURSE, ROBIN BASS,RN. 

## 2012-05-20 ENCOUNTER — Telehealth: Payer: Self-pay | Admitting: Medical Oncology

## 2012-05-20 MED ORDER — LENALIDOMIDE 10 MG PO CAPS: 10.0000 mg | ORAL_CAPSULE | Freq: Every day | ORAL | Status: DC

## 2012-05-20 NOTE — Addendum Note (Signed)
Addended by: Laroy Apple E on: 05/20/2012 03:53 PM   Modules accepted: Orders

## 2012-05-20 NOTE — Telephone Encounter (Signed)
Rx faxed to [facility]

## 2012-05-20 NOTE — Telephone Encounter (Signed)
Pt called and left a message asking what she could take for diarrhea. I left her a message to let her know she can take imodium. I explained how to take it and to call me if any questions.

## 2012-05-21 ENCOUNTER — Encounter: Payer: Self-pay | Admitting: *Deleted

## 2012-05-21 ENCOUNTER — Other Ambulatory Visit: Payer: Self-pay | Admitting: Oncology

## 2012-05-21 DIAGNOSIS — C9000 Multiple myeloma not having achieved remission: Secondary | ICD-10-CM

## 2012-05-21 NOTE — Progress Notes (Signed)
RECEIVED A FAX FROM [facility] CONCERNING A CONFIRMATION OF PRESCRIPTION BENEFITS. PT.'S COPAY IS ZERO.

## 2012-05-22 NOTE — Progress Notes (Signed)
RECEIVED A FAX FROM [facility] CONCERNING A CONFIRMATION OF PRESCRIPTION FOR REVLIMID ON 05/25/12.

## 2012-06-15 ENCOUNTER — Other Ambulatory Visit: Payer: Self-pay | Admitting: *Deleted

## 2012-06-15 ENCOUNTER — Other Ambulatory Visit (HOSPITAL_BASED_OUTPATIENT_CLINIC_OR_DEPARTMENT_OTHER): Payer: Medicare Other | Admitting: Lab

## 2012-06-15 DIAGNOSIS — C9000 Multiple myeloma not having achieved remission: Secondary | ICD-10-CM

## 2012-06-15 DIAGNOSIS — Z7901 Long term (current) use of anticoagulants: Secondary | ICD-10-CM

## 2012-06-15 LAB — CBC WITH DIFFERENTIAL/PLATELET
BASO%: 0.4 % (ref 0.0–2.0)
Basophils Absolute: 0 10*3/uL (ref 0.0–0.1)
EOS%: 2.2 % (ref 0.0–7.0)
Eosinophils Absolute: 0.1 10*3/uL (ref 0.0–0.5)
HCT: 34.3 % — ABNORMAL LOW (ref 34.8–46.6)
HGB: 10.8 g/dL — ABNORMAL LOW (ref 11.6–15.9)
LYMPH%: 50 % — ABNORMAL HIGH (ref 14.0–49.7)
MCH: 30.5 pg (ref 25.1–34.0)
MCHC: 31.5 g/dL (ref 31.5–36.0)
MCV: 96.9 fL (ref 79.5–101.0)
MONO#: 0.5 10*3/uL (ref 0.1–0.9)
MONO%: 10.1 % (ref 0.0–14.0)
NEUT#: 1.7 10*3/uL (ref 1.5–6.5)
NEUT%: 37.3 % — ABNORMAL LOW (ref 38.4–76.8)
Platelets: 199 10*3/uL (ref 145–400)
RBC: 3.54 10*6/uL — ABNORMAL LOW (ref 3.70–5.45)
RDW: 14.9 % — ABNORMAL HIGH (ref 11.2–14.5)
WBC: 4.5 10*3/uL (ref 3.9–10.3)
lymph#: 2.2 10*3/uL (ref 0.9–3.3)
nRBC: 0 % (ref 0–0)

## 2012-06-15 LAB — PROTIME-INR
INR: 1.4 — ABNORMAL LOW (ref 2.00–3.50)
Protime: 16.8 Seconds — ABNORMAL HIGH (ref 10.6–13.4)

## 2012-06-15 MED ORDER — LENALIDOMIDE 10 MG PO CAPS: 10.0000 mg | ORAL_CAPSULE | Freq: Every day | ORAL | Status: DC

## 2012-06-15 NOTE — Addendum Note (Signed)
Addended by: Arvilla Meres on: 06/15/2012 12:30 PM   Modules accepted: Orders

## 2012-06-15 NOTE — Telephone Encounter (Signed)
THIS REFILL REQUEST FOR REVLIMID WAS GIVEN TO DR.MURINSON'S NURSE, ROBIN BASS,RN. 

## 2012-06-16 NOTE — Telephone Encounter (Signed)
RECEIVED A FAX FROM [facility] CONCERNING A CONFIRMATION OF PT.'S PRESCRIPTION BENEFITS.

## 2012-06-16 NOTE — Telephone Encounter (Signed)
RECEIVED A FAX FROM [facility] CONCERNING A CONFIRMATION OF PRESCRIPTION SHIPMENT FOR REVLIMID.

## 2012-06-22 ENCOUNTER — Other Ambulatory Visit: Payer: Self-pay | Admitting: Oncology

## 2012-07-02 ENCOUNTER — Other Ambulatory Visit: Payer: Self-pay | Admitting: *Deleted

## 2012-07-02 DIAGNOSIS — C9000 Multiple myeloma not having achieved remission: Secondary | ICD-10-CM

## 2012-07-02 MED ORDER — LENALIDOMIDE 10 MG PO CAPS: 10.0000 mg | ORAL_CAPSULE | Freq: Every day | ORAL | Status: DC

## 2012-07-02 NOTE — Telephone Encounter (Signed)
THIS REFILL REQUEST FOR REVLIMID WAS GIVEN TO DR.MURINSON'S NURSE, ROBIN BASS,RN. 

## 2012-07-02 NOTE — Addendum Note (Signed)
Addended by: Arvilla Meres on: 07/02/2012 01:49 PM   Modules accepted: Orders

## 2012-07-03 NOTE — Telephone Encounter (Signed)
RECEIVED A FAX FROM [facility] CONCERNING A CONFIRMATION OF PRESCRIPTION BENEFITS. 

## 2012-07-06 ENCOUNTER — Other Ambulatory Visit (HOSPITAL_BASED_OUTPATIENT_CLINIC_OR_DEPARTMENT_OTHER): Payer: Medicare Other

## 2012-07-06 DIAGNOSIS — C9000 Multiple myeloma not having achieved remission: Secondary | ICD-10-CM

## 2012-07-06 DIAGNOSIS — Z7901 Long term (current) use of anticoagulants: Secondary | ICD-10-CM

## 2012-07-06 LAB — PROTIME-INR
INR: 1.4 — ABNORMAL LOW (ref 2.00–3.50)
Protime: 16.8 Seconds — ABNORMAL HIGH (ref 10.6–13.4)

## 2012-07-06 LAB — CBC WITH DIFFERENTIAL/PLATELET
BASO%: 0.4 % (ref 0.0–2.0)
Basophils Absolute: 0 10*3/uL (ref 0.0–0.1)
EOS%: 2.8 % (ref 0.0–7.0)
Eosinophils Absolute: 0.1 10*3/uL (ref 0.0–0.5)
HCT: 34.5 % — ABNORMAL LOW (ref 34.8–46.6)
HGB: 11.1 g/dL — ABNORMAL LOW (ref 11.6–15.9)
LYMPH%: 36.1 % (ref 14.0–49.7)
MCH: 31 pg (ref 25.1–34.0)
MCHC: 32.2 g/dL (ref 31.5–36.0)
MCV: 96.4 fL (ref 79.5–101.0)
MONO#: 0.5 10*3/uL (ref 0.1–0.9)
MONO%: 11.2 % (ref 0.0–14.0)
NEUT#: 2.3 10*3/uL (ref 1.5–6.5)
NEUT%: 49.5 % (ref 38.4–76.8)
Platelets: 186 10*3/uL (ref 145–400)
RBC: 3.58 10*6/uL — ABNORMAL LOW (ref 3.70–5.45)
RDW: 14.9 % — ABNORMAL HIGH (ref 11.2–14.5)
WBC: 4.7 10*3/uL (ref 3.9–10.3)
lymph#: 1.7 10*3/uL (ref 0.9–3.3)

## 2012-07-06 NOTE — Telephone Encounter (Signed)
RECEIVED A FAX FROM [facility] CONCERNING A CONFIRMATION OF PRESCRIPTION SHIPMENT FOR REVLIMID ON 07/07/12.

## 2012-07-22 ENCOUNTER — Other Ambulatory Visit: Payer: Self-pay | Admitting: *Deleted

## 2012-07-22 ENCOUNTER — Telehealth: Payer: Self-pay | Admitting: *Deleted

## 2012-07-22 DIAGNOSIS — C9000 Multiple myeloma not having achieved remission: Secondary | ICD-10-CM

## 2012-07-22 MED ORDER — LENALIDOMIDE 10 MG PO CAPS: 10.0000 mg | ORAL_CAPSULE | Freq: Every day | ORAL | Status: DC

## 2012-07-22 NOTE — Telephone Encounter (Signed)
Attempted to call patient regarding Revlimid refill request. Patient has upcoming appt with Dr Arline Asp on 07/28/12 to reassess labs, urine and effects of med. Want to know if patient has enough supply at home until that visit. Will also check with MD to see if labs are needed before visit instead of day of.

## 2012-07-22 NOTE — Telephone Encounter (Signed)
Called and left message for patient to come get supplies from lab to obtain urine studies that need to be brought in for office visit on 07/28/12. Reminded patient to take [employer] survey

## 2012-07-28 ENCOUNTER — Ambulatory Visit (HOSPITAL_BASED_OUTPATIENT_CLINIC_OR_DEPARTMENT_OTHER): Payer: Medicare Other | Admitting: Oncology

## 2012-07-28 ENCOUNTER — Telehealth: Payer: Self-pay | Admitting: Oncology

## 2012-07-28 ENCOUNTER — Encounter: Payer: Self-pay | Admitting: Oncology

## 2012-07-28 ENCOUNTER — Ambulatory Visit (HOSPITAL_BASED_OUTPATIENT_CLINIC_OR_DEPARTMENT_OTHER): Payer: Medicare Other

## 2012-07-28 ENCOUNTER — Other Ambulatory Visit (HOSPITAL_BASED_OUTPATIENT_CLINIC_OR_DEPARTMENT_OTHER): Payer: Medicare Other | Admitting: Lab

## 2012-07-28 VITALS — BP 147/78 | HR 67 | Temp 97.8°F | Resp 20 | Ht 64.5 in | Wt 167.6 lb

## 2012-07-28 DIAGNOSIS — C9000 Multiple myeloma not having achieved remission: Secondary | ICD-10-CM

## 2012-07-28 DIAGNOSIS — Z7901 Long term (current) use of anticoagulants: Secondary | ICD-10-CM

## 2012-07-28 DIAGNOSIS — G609 Hereditary and idiopathic neuropathy, unspecified: Secondary | ICD-10-CM

## 2012-07-28 LAB — COMPREHENSIVE METABOLIC PANEL (CC13)
ALT: 21 U/L (ref 0–55)
AST: 19 U/L (ref 5–34)
Albumin: 4 g/dL (ref 3.5–5.0)
Alkaline Phosphatase: 84 U/L (ref 40–150)
BUN: 15 mg/dL (ref 7.0–26.0)
CO2: 25 mEq/L (ref 22–29)
Calcium: 9.7 mg/dL (ref 8.4–10.4)
Chloride: 106 mEq/L (ref 98–107)
Creatinine: 0.9 mg/dL (ref 0.6–1.1)
Glucose: 89 mg/dl (ref 70–99)
Potassium: 4.2 mEq/L (ref 3.5–5.1)
Sodium: 141 mEq/L (ref 136–145)
Total Bilirubin: 0.48 mg/dL (ref 0.20–1.20)
Total Protein: 8.3 g/dL (ref 6.4–8.3)

## 2012-07-28 LAB — CBC WITH DIFFERENTIAL/PLATELET
BASO%: 0.8 % (ref 0.0–2.0)
Basophils Absolute: 0 10*3/uL (ref 0.0–0.1)
EOS%: 1.9 % (ref 0.0–7.0)
Eosinophils Absolute: 0.1 10*3/uL (ref 0.0–0.5)
HCT: 38.6 % (ref 34.8–46.6)
HGB: 12.2 g/dL (ref 11.6–15.9)
LYMPH%: 43.4 % (ref 14.0–49.7)
MCH: 31.3 pg (ref 25.1–34.0)
MCHC: 31.6 g/dL (ref 31.5–36.0)
MCV: 99 fL (ref 79.5–101.0)
MONO#: 0.5 10*3/uL (ref 0.1–0.9)
MONO%: 10.9 % (ref 0.0–14.0)
NEUT#: 2.1 10*3/uL (ref 1.5–6.5)
NEUT%: 43 % (ref 38.4–76.8)
Platelets: 190 10*3/uL (ref 145–400)
RBC: 3.9 10*6/uL (ref 3.70–5.45)
RDW: 15.1 % — ABNORMAL HIGH (ref 11.2–14.5)
WBC: 4.9 10*3/uL (ref 3.9–10.3)
lymph#: 2.1 10*3/uL (ref 0.9–3.3)
nRBC: 0 % (ref 0–0)

## 2012-07-28 LAB — PROTIME-INR
INR: 1.6 — ABNORMAL LOW (ref 2.00–3.50)
Protime: 19.2 Seconds — ABNORMAL HIGH (ref 10.6–13.4)

## 2012-07-28 LAB — PROTEIN / CREATININE RATIO, URINE
Creatinine, Urine: 79.1 mg/dL
Protein Creatinine Ratio: 0.08 (ref <0.15)
Total Protein, Urine: 6 mg/dL

## 2012-07-28 LAB — LACTATE DEHYDROGENASE (CC13): LDH: 226 U/L (ref 125–245)

## 2012-07-28 MED ORDER — ZOLEDRONIC ACID 4 MG/100ML IV SOLN
4.0000 mg | Freq: Once | INTRAVENOUS | Status: AC
  Administered 2012-07-28: 4 mg via INTRAVENOUS
  Filled 2012-07-28: qty 100

## 2012-07-28 NOTE — Patient Instructions (Addendum)
Zoledronic Acid injection (Hypercalcemia, Oncology) What is this medicine? ZOLEDRONIC ACID (ZOE le dron ik AS id) lowers the amount of calcium loss from bone. It is used to treat too much calcium in your blood from cancer. It is also used to prevent complications of cancer that has spread to the bone. This medicine may be used for other purposes; ask your health care provider or pharmacist if you have questions. What should I tell my health care provider before I take this medicine? They need to know if you have any of these conditions: -aspirin-sensitive asthma -dental disease -kidney disease -an unusual or allergic reaction to zoledronic acid, other medicines, foods, dyes, or preservatives -pregnant or trying to get pregnant -breast-feeding How should I use this medicine? This medicine is for infusion into a vein. It is given by a health care professional in a hospital or clinic setting. Talk to your pediatrician regarding the use of this medicine in children. Special care may be needed. Overdosage: If you think you have taken too much of this medicine contact a poison control center or emergency room at once. NOTE: This medicine is only for you. Do not share this medicine with others. What if I miss a dose? It is important not to miss your dose. Call your doctor or health care professional if you are unable to keep an appointment. What may interact with this medicine? -certain antibiotics given by injection -NSAIDs, medicines for pain and inflammation, like ibuprofen or naproxen -some diuretics like bumetanide, furosemide -teriparatide -thalidomide This list may not describe all possible interactions. Give your health care provider a list of all the medicines, herbs, non-prescription drugs, or dietary supplements you use. Also tell them if you smoke, drink alcohol, or use illegal drugs. Some items may interact with your medicine. What should I watch for while using this medicine? Visit  your doctor or health care professional for regular checkups. It may be some time before you see the benefit from this medicine. Do not stop taking your medicine unless your doctor tells you to. Your doctor may order blood tests or other tests to see how you are doing. Women should inform their doctor if they wish to become pregnant or think they might be pregnant. There is a potential for serious side effects to an unborn child. Talk to your health care professional or pharmacist for more information. You should make sure that you get enough calcium and vitamin D while you are taking this medicine. Discuss the foods you eat and the vitamins you take with your health care professional. Some people who take this medicine have severe bone, joint, and/or muscle pain. This medicine may also increase your risk for a broken thigh bone. Tell your doctor right away if you have pain in your upper leg or groin. Tell your doctor if you have any pain that does not go away or that gets worse. What side effects may I notice from receiving this medicine? Side effects that you should report to your doctor or health care professional as soon as possible: -allergic reactions like skin rash, itching or hives, swelling of the face, lips, or tongue -anxiety, confusion, or depression -breathing problems -changes in vision -feeling faint or lightheaded, falls -jaw burning, cramping, pain -muscle cramps, stiffness, or weakness -trouble passing urine or change in the amount of urine Side effects that usually do not require medical attention (report to your doctor or health care professional if they continue or are bothersome): -bone, joint, or muscle pain -  fever -hair loss -irritation at site where injected -loss of appetite -nausea, vomiting -stomach upset -tired This list may not describe all possible side effects. Call your doctor for medical advice about side effects. You may report side effects to FDA at  1-800-FDA-1088. Where should I keep my medicine? This drug is given in a hospital or clinic and will not be stored at home. NOTE: This sheet is a summary. It may not cover all possible information. If you have questions about this medicine, talk to your doctor, pharmacist, or health care provider.  2013, [employer]. (09/14/2010 9:06:58 AM)  

## 2012-07-28 NOTE — Progress Notes (Signed)
This office note has been dictated.  #[id number]

## 2012-07-29 NOTE — Progress Notes (Signed)
CC:   [name], MD [name], [name] [name], MD  PROBLEM LIST:  1. Multiple myeloma diagnosed in August 2010. She has kappa light  chain disease. In addition, she was found to have monoclonal IgG  as well in her urine back at the time of diagnosis. Bone marrow  initially showed 50% plasma cells. Metastatic bone survey carried  out on March 30, 2009 was negative. Ms. Carrara was initially  treated with Velcade, Decadron and Doxil for 4-1/2 cycles from  04/24/2009 through 07/27/2009. She developed peripheral  neuropathy. Another bone marrow was carried out on 08/03/2009,  showed 30% plasma cells. Revlimid and Decadron were given from  09/01/2009 through December 2011. Bone marrow on March 01, 2010  showed 5% plasma cells. The patient then underwent high-dose  melphalan with autologous stem cell infusion on 04/26/2010. She  has had an excellent response to that treatment. Since June 2012,  she has been receiving maintenance Revlimid and Zometa. Serum  immunofixation electrophoresis from 10/26/2010 was negative.   Urine immunofixation electrophoresis from July 2012 continues to show  monoclonal free kappa light chains. Total urine protein was 125 mg  at that time. Back at the time of diagnosis in December 2010, the  24-hour urine protein was over 8 g. The patient's most recent  metastatic bone survey carried out at [facility] on 02/07/2011 was  Negative.   A 24 hour urine collection from 02/06/2012 yielded a protein of 704 mg  as compared with 218 mg on 07/30/2011, 125 mg on 04/19/2011 and 62 mg on  07/10/2010. The free kappa light chain excretion was 123.5 mg  suggesting that most of the protein is not the kappa light chain.  The urine kappa to lambda ratio was 31.88 and the urine  immunofixation electrophoresis was positive for monoclonal kappa light  chains. Urine protein to creatinine ratio was 0.04. Serum  immunofixation electrophoresis from 02/06/2012 showed no  monoclonal  protein. IgG level was 1530, IgA level 187 and IgM level 32, the latter  being low. Metastatic bone survey from 02/06/2012 showed no evidence  for metastatic disease.   A 24-hour urine collection from 04/30/2012 yielded 342 mg of protein, of which 304 mg were monoclonal kappa light chains.  Urine immunofixation electrophoresis was positive for monoclonal free kappa light chains.  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 Symptoms  developed in April 2011. The patient is currently on Neurontin.  3. Hypertension.  4. GERD.  5. Dyslipidemia.  6. Glaucoma.   MEDICATIONS:  Reviewed and recorded. Current Outpatient Prescriptions  Medication Sig Dispense Refill  . acetaminophen (TYLENOL) 500 MG tablet Take 500 mg by mouth every 6 (six) hours as needed.        Marland Kitchen allopurinol (ZYLOPRIM) 300 MG tablet TAKE ONE TABLET BY MOUTH EVERY DAY  30 tablet  5  . gabapentin (NEURONTIN) 600 MG tablet Take 1 tablet (600 mg total) by mouth 2 (two) times daily.  120 tablet  3  . KLOR-CON M20 20 MEQ tablet TAKE ONE TABLET BY MOUTH TWICE DAILY  60 tablet  3  . latanoprost (XALATAN) 0.005 % ophthalmic solution 1 drop at bedtime.        Marland Kitchen lenalidomide (REVLIMID) 10 MG capsule Take 1 capsule (10 mg total) by mouth daily. TAKE ONE 10 mg CAPSULE DAILY BY MOUTH FOR 14 DAYS THEN REST 7 Days.  14 capsule  0  . LORazepam (ATIVAN) 1 MG tablet Take 1 mg by mouth every 4 (four) hours as  needed.        Marland Kitchen oxyCODONE (OXY IR/ROXICODONE) 5 MG immediate release tablet Take 1 tablet (5 mg total) by mouth every 6 (six) hours as needed.  30 tablet  0  . pantoprazole (PROTONIX) 40 MG tablet TAKE ONE TABLET BY MOUTH EVERY DAY  30 tablet  5  . prochlorperazine (COMPAZINE) 10 MG tablet Take 10 mg by mouth every 6 (six) hours as needed.        . timolol (BETIMOL) 0.5 % ophthalmic solution 1 drop 2 (two) times daily.        Marland Kitchen VITAMIN D, ERGOCALCIFEROL, PO Take 1,000 Units by mouth 2 (two) times daily.       Marland Kitchen  warfarin (COUMADIN) 6 MG tablet Take 6 mg by mouth daily. Coumadin managed by Dr Arline Asp      . Alum & Mag Hydroxide-Simeth (MAGIC MOUTHWASH) SOLN Take 5 mLs by mouth as needed.         No current facility-administered medications for this visit.   TREATMENT PROGRAM: -Revlimid 10 mg daily, 2 weeks on, 1 week off. Revlimid was started  on 09/24/2010.  -Zometa 4 mg IV currently being given every 3 months. This was  started on 09/28/2010 and initially was given monthly.  -Coumadin 6 mg daily to maintain an INR of approximately 1.5. This  is being followed by [facility].   IMMUNIZATIONS:  Pneumovax was given in July 2012.  Flu shot was given on February 12, 2011.  Inoculations following high-dose therapy and autologous stem cell  infusion were given on May 05, 2011.   SMOKING HISTORY: The patient has never smoked cigarettes.   HISTORY:  Carmen Fisher was seen today for followup of her kappa light chain multiple myeloma diagnosed in December 2010.  [name] was last seen by [place] on 04/28/2012.  Clinically there has been no change in her condition.  She went on a cruise with her family back in February.  The patient continues to have some neuropathy with difficulty doing buttons. She occasionally has some minor low back pain.  Her overall condition really has not changed.  She denies any side effects from the IV Zometa that she  receives every 3 months.  She is on Revlimid 10 mg daily by mouth, 2 weeks out of every 3.  She is due to start Revlimid again today.  The patient is being seen at [facility] about once a year.  She was there last May and has an appointment to see Dr. Barbaraann Boys in May 2014.  The patient is without any specific complaints today and seems to be doing well.  PHYSICAL EXAMINATION:  General:  [name] looks well.  Vital Signs: Weight is 167 pounds 9.6 ounces, height 5 feet 4-1/2 inches, body surface area of 1.86 meters squared.  Blood pressure 147/78.   Other vital signs are normal.  HEENT:  There is no scleral icterus.  Mouth and pharynx are benign.  Lymph:  No peripheral adenopathy palpable.  Lungs: Clear to percussion and auscultation.  Cardiac:  Regular rhythm, with a possible soft early systolic ejection murmur.  Breasts:  Not examined. No axillary adenopathy.  Abdomen:  Benign, with no organomegaly or masses palpable.  Extremities:  No peripheral edema or clubbing. Neurologic:  Exam was normal.  Lines:  The patient does not have a Port- A-Cath or central catheter.  LABORATORY DATA:  Today white count 4.9, ANC 2.1, hemoglobin 12.2, hematocrit 38.6, platelets 190,000.  Pro-time 19.2, with an INR  of 1.60. Chemistries today, along with quantitative immunoglobulins, serum for light chains, and a urine protein-to-creatinine ratio are pending. Chemistries from 04/28/2012 were entirely normal.  BUN was 12, creatinine 0.9.  A 24-hour urine collection from 04/30/2012 yielded 342 mg of protein, of which 304 mg were monoclonal kappa light chains. Urine immunofixation electrophoresis was positive for monoclonal free kappa light chains.  It will be recalled that on 02/06/2012, a 24-hour urine collection yielded 704 mg of protein, of which 123.5 mg were kappa light chains.  The urine protein-to-creatinine ratio on 04/28/2012 was 0.15.  On 02/06/2012, IgG level was 1530, IgA 187, and IgM 32.  Serum immunofixation electrophoresis from 02/06/2012 showed no monoclonal protein.  IMAGING STUDIES:  1. Chest x-ray, 2 view, from 03/21/2010 showed a tortuous aorta and  scoliosis, otherwise negative.  2. Metastatic bone survey was carried out at [facility] on 02/07/2011. This  was negative. The report has been scanned into the Epic system.  3. Metastatic bone survey from 02/06/2012 showed no evidence for metastatic  disease. There were no lytic lesions suggestive of multiple myeloma.   IMPRESSION AND PLAN:  Ms. Bellavance continues to do well, now approaching  3- 1/2 years from the time of diagnosis, which apparently was made in December 2010, when a bone marrow at that time showed 50% plasma cells. The patient had been referred to me because of anemia.  The patient is more than 2 years out from the time of high-dose melphalan and autologous stem cell transplant.  Ms. Buxton is due for Zometa today.  She receives this every 3 months. She will continue with Revlimid 10 mg daily, 2 weeks out of every 3. She will continue on the Coumadin to maintain an INR somewhere around 1.5.  We will continue to check CBC and pro-time every 3 weeks.  The patient has an appointment to see Dr. Barbaraann Boys in May.  I have asked Ms. Grandberry to return to see [place] in about 3 months, which will be somewhere around 10/20/2012.  Questions that I have asked the patient to discuss with Dr. Barbaraann Boys have to do with the duration of Zometa, whether the patient can stop this, also the duration of Revlimid, and whether she needs to continue to stay on the Coumadin.  If the patient has not had a 24-hour urine collection at the time she sees Dr. Barbaraann Boys, then we will probably obtain another 24-hour urine when we see the patient again in late July.    ______________________________ Samul Dada, [name] DSM/MEDQ  D:  07/28/2012  T:  07/29/2012  Job:  [id number]

## 2012-07-30 LAB — SPEP & IFE WITH QIG
Albumin ELP: 55.2 % — ABNORMAL LOW (ref 55.8–66.1)
Alpha-1-Globulin: 3.4 % (ref 2.9–4.9)
Alpha-2-Globulin: 11 % (ref 7.1–11.8)
Beta 2: 5.1 % (ref 3.2–6.5)
Beta Globulin: 5.9 % (ref 4.7–7.2)
Gamma Globulin: 19.4 % — ABNORMAL HIGH (ref 11.1–18.8)
IgA: 312 mg/dL (ref 69–380)
IgG (Immunoglobin G), Serum: 1640 mg/dL (ref 690–1700)
IgM, Serum: 39 mg/dL — ABNORMAL LOW (ref 52–322)
Total Protein, Serum Electrophoresis: 7.9 g/dL (ref 6.0–8.3)

## 2012-07-30 LAB — KAPPA/LAMBDA LIGHT CHAINS
Kappa free light chain: 5.58 mg/dL — ABNORMAL HIGH (ref 0.33–1.94)
Kappa:Lambda Ratio: 2.37 — ABNORMAL HIGH (ref 0.26–1.65)
Lambda Free Lght Chn: 2.35 mg/dL (ref 0.57–2.63)

## 2012-08-05 ENCOUNTER — Encounter: Payer: Self-pay | Admitting: Oncology

## 2012-08-05 ENCOUNTER — Telehealth: Payer: Self-pay

## 2012-08-05 NOTE — Telephone Encounter (Signed)
lvm to call and verify that pt has no history of blood clots we are unaware of. If this is true Dr Arline Asp will stop coumadin and start aspirin 81 mg daily. (See Dr Murinson's documentation)

## 2012-08-05 NOTE — Progress Notes (Signed)
This patient was seen by Dr. Barbaraann Boys on 08/08/2011. We have a copy of this note which we will submit for scanning.  The patient has a return appointment for 12 months.

## 2012-08-05 NOTE — Progress Notes (Signed)
This patient was discussed with Dr. Barbaraann Boys. If the patient does not have a history of blood clots, then we can place her on aspirin 81 mg daily and discontinue Coumadin.

## 2012-08-06 ENCOUNTER — Telehealth: Payer: Self-pay

## 2012-08-06 ENCOUNTER — Encounter: Payer: Self-pay | Admitting: Oncology

## 2012-08-06 NOTE — Telephone Encounter (Signed)
S/w pt about Dr Murinson's plan to switch her to ASA from coumadin. Pt reminded [place] she has allergy to ASA - her face swells. Dr Arline Asp said to keep on coumadin at present and he will reassess his plans. Pt states she will be seeing Dr Barbaraann Boys next week and I said to mention this to her.

## 2012-08-06 NOTE — Progress Notes (Signed)
This patient states that she is allergic to aspirin. That may be the explanation for why she was originally treated with Coumadin with a target INR of 1.5.  We were planning to discontinue Coumadin and place her on prophylactic aspirin. Under the circumstances we will leave well enough alone and continue Coumadin.

## 2012-08-10 ENCOUNTER — Other Ambulatory Visit (HOSPITAL_COMMUNITY): Payer: Self-pay | Admitting: Oncology

## 2012-08-10 DIAGNOSIS — C9000 Multiple myeloma not having achieved remission: Secondary | ICD-10-CM

## 2012-08-13 ENCOUNTER — Encounter: Payer: Self-pay | Admitting: Oncology

## 2012-08-13 NOTE — Progress Notes (Signed)
I received an office note from Dr. [name] dated 08/12/2012.  The plan as stated was: 1. Continue low-dose Revlimid until progression. 2. On Coumadin because she is allergic to aspirin (swelling of face). Consider decreasing the dose to 2 mg by mouth daily. No need to check PT periodically. 3. Restaging labs every 3 months. 4. 24-hour urine at least yearly or at time of progression. 5. Return visit with Dr. Barbaraann Boys in 12 months with routine restaging labs including a 24-hour urine. 6. Add B complex vitamins for management of peripheral neuropathy involving fingers.

## 2012-08-17 ENCOUNTER — Other Ambulatory Visit: Payer: Self-pay | Admitting: Medical Oncology

## 2012-08-17 ENCOUNTER — Other Ambulatory Visit: Payer: Self-pay | Admitting: *Deleted

## 2012-08-17 ENCOUNTER — Telehealth: Payer: Self-pay | Admitting: Medical Oncology

## 2012-08-17 DIAGNOSIS — C9000 Multiple myeloma not having achieved remission: Secondary | ICD-10-CM

## 2012-08-17 MED ORDER — LENALIDOMIDE 10 MG PO CAPS: 10.0000 mg | ORAL_CAPSULE | Freq: Every day | ORAL | Status: DC

## 2012-08-17 NOTE — Telephone Encounter (Signed)
RECEIVED A FAX FROM [facility] CONCERNING A CONFIRMATION OF PT.'S PRESCRIPTION BENEFITS. PT.'S COPAY IS ZERO. 

## 2012-08-17 NOTE — Telephone Encounter (Signed)
THIS REFILL REQUEST FOR REVLIMID WAS GIVEN TO DR.MURINSON'S NURSE, ROBIN BASS,RN. 

## 2012-08-17 NOTE — Addendum Note (Signed)
Addended by: Arvilla Meres on: 08/17/2012 10:57 AM   Modules accepted: Orders

## 2012-08-17 NOTE — Telephone Encounter (Signed)
Pt called and states that she has not received her revlimid. She will need to restart tomorrow. I spoke with the pharmacist at [facility] and they are going to send an urgent prescription. She states that they can process and ship today. Pt made aware.

## 2012-08-18 ENCOUNTER — Other Ambulatory Visit (HOSPITAL_BASED_OUTPATIENT_CLINIC_OR_DEPARTMENT_OTHER): Payer: Medicare Other

## 2012-08-18 DIAGNOSIS — Z7901 Long term (current) use of anticoagulants: Secondary | ICD-10-CM

## 2012-08-18 DIAGNOSIS — C9000 Multiple myeloma not having achieved remission: Secondary | ICD-10-CM

## 2012-08-18 LAB — CBC WITH DIFFERENTIAL/PLATELET
BASO%: 0.8 % (ref 0.0–2.0)
Basophils Absolute: 0 10*3/uL (ref 0.0–0.1)
EOS%: 1.8 % (ref 0.0–7.0)
Eosinophils Absolute: 0.1 10*3/uL (ref 0.0–0.5)
HCT: 35.1 % (ref 34.8–46.6)
HGB: 11.2 g/dL — ABNORMAL LOW (ref 11.6–15.9)
LYMPH%: 47.3 % (ref 14.0–49.7)
MCH: 31.3 pg (ref 25.1–34.0)
MCHC: 31.9 g/dL (ref 31.5–36.0)
MCV: 98 fL (ref 79.5–101.0)
MONO#: 0.4 10*3/uL (ref 0.1–0.9)
MONO%: 10.9 % (ref 0.0–14.0)
NEUT#: 1.5 10*3/uL (ref 1.5–6.5)
NEUT%: 39.2 % (ref 38.4–76.8)
Platelets: 185 10*3/uL (ref 145–400)
RBC: 3.58 10*6/uL — ABNORMAL LOW (ref 3.70–5.45)
RDW: 15.1 % — ABNORMAL HIGH (ref 11.2–14.5)
WBC: 3.9 10*3/uL (ref 3.9–10.3)
lymph#: 1.9 10*3/uL (ref 0.9–3.3)
nRBC: 0 % (ref 0–0)

## 2012-08-18 LAB — PROTIME-INR
INR: 1.9 — ABNORMAL LOW (ref 2.00–3.50)
Protime: 22.8 Seconds — ABNORMAL HIGH (ref 10.6–13.4)

## 2012-08-18 NOTE — Telephone Encounter (Signed)
RECEIVED A FAX FROM [facility] CONCERNING A CONFIRMATION OF PRESCRIPTION SHIPMENT FOR REVLIMID ON 08/19/12

## 2012-09-02 ENCOUNTER — Other Ambulatory Visit: Payer: Self-pay | Admitting: *Deleted

## 2012-09-02 DIAGNOSIS — C9000 Multiple myeloma not having achieved remission: Secondary | ICD-10-CM

## 2012-09-02 MED ORDER — LENALIDOMIDE 10 MG PO CAPS: 10.0000 mg | ORAL_CAPSULE | Freq: Every day | ORAL | Status: DC

## 2012-09-03 NOTE — Telephone Encounter (Signed)
RECEIVED A FAX FROM [facility] CONCERNING A CONFIRMATION OF PRESCRIPTION BENEFITS. PT.'S COPAY IS ZERO.

## 2012-09-07 NOTE — Telephone Encounter (Signed)
RECEIVED A FAX FROM [facility] CONCERNING A CONFIRMATION OF PRESCRIPTION SHIPMENT FOR REVLIMID ON 09/08/12.

## 2012-09-08 ENCOUNTER — Other Ambulatory Visit (HOSPITAL_BASED_OUTPATIENT_CLINIC_OR_DEPARTMENT_OTHER): Payer: Medicare Other

## 2012-09-08 DIAGNOSIS — C9001 Multiple myeloma in remission: Secondary | ICD-10-CM

## 2012-09-08 DIAGNOSIS — Z7901 Long term (current) use of anticoagulants: Secondary | ICD-10-CM

## 2012-09-08 DIAGNOSIS — C9000 Multiple myeloma not having achieved remission: Secondary | ICD-10-CM

## 2012-09-08 LAB — CBC WITH DIFFERENTIAL/PLATELET
BASO%: 0.7 % (ref 0.0–2.0)
Basophils Absolute: 0 10*3/uL (ref 0.0–0.1)
EOS%: 1.4 % (ref 0.0–7.0)
Eosinophils Absolute: 0.1 10*3/uL (ref 0.0–0.5)
HCT: 34 % — ABNORMAL LOW (ref 34.8–46.6)
HGB: 11 g/dL — ABNORMAL LOW (ref 11.6–15.9)
LYMPH%: 52.7 % — ABNORMAL HIGH (ref 14.0–49.7)
MCH: 31.3 pg (ref 25.1–34.0)
MCHC: 32.4 g/dL (ref 31.5–36.0)
MCV: 96.9 fL (ref 79.5–101.0)
MONO#: 0.4 10*3/uL (ref 0.1–0.9)
MONO%: 8.7 % (ref 0.0–14.0)
NEUT#: 1.5 10*3/uL (ref 1.5–6.5)
NEUT%: 36.5 % — ABNORMAL LOW (ref 38.4–76.8)
Platelets: 192 10*3/uL (ref 145–400)
RBC: 3.51 10*6/uL — ABNORMAL LOW (ref 3.70–5.45)
RDW: 14.6 % — ABNORMAL HIGH (ref 11.2–14.5)
WBC: 4.2 10*3/uL (ref 3.9–10.3)
lymph#: 2.2 10*3/uL (ref 0.9–3.3)
nRBC: 0 % (ref 0–0)

## 2012-09-08 LAB — PROTIME-INR
INR: 1.9 — ABNORMAL LOW (ref 2.00–3.50)
Protime: 22.8 Seconds — ABNORMAL HIGH (ref 10.6–13.4)

## 2012-09-22 ENCOUNTER — Telehealth: Payer: Self-pay | Admitting: *Deleted

## 2012-09-22 ENCOUNTER — Other Ambulatory Visit: Payer: Self-pay | Admitting: *Deleted

## 2012-09-22 DIAGNOSIS — C9000 Multiple myeloma not having achieved remission: Secondary | ICD-10-CM

## 2012-09-22 MED ORDER — LENALIDOMIDE 10 MG PO CAPS: ORAL_CAPSULE | ORAL | Status: DC

## 2012-09-22 NOTE — Telephone Encounter (Signed)
[facility] faxed Revlomid refill request.  Request to provider for review.

## 2012-09-24 NOTE — Telephone Encounter (Signed)
[facility] faxed confirmation of facsimile receipt for Revlimed prescription referral.  [facility] will verify insurance and make delivery arrangements with patient.  Patient's copay amount is $0.00.

## 2012-09-24 NOTE — Telephone Encounter (Signed)
RECEIVED A FAX FROM [facility] CONCERNING A CONFIRMATION OF A PRESCRIPTION SHIPMENT FOR REVLIMID TO BE DELIVERED 09/28/12.

## 2012-09-29 ENCOUNTER — Other Ambulatory Visit (HOSPITAL_BASED_OUTPATIENT_CLINIC_OR_DEPARTMENT_OTHER): Payer: Medicare Other

## 2012-09-29 DIAGNOSIS — C9000 Multiple myeloma not having achieved remission: Secondary | ICD-10-CM

## 2012-09-29 DIAGNOSIS — Z7901 Long term (current) use of anticoagulants: Secondary | ICD-10-CM

## 2012-09-29 LAB — CBC WITH DIFFERENTIAL/PLATELET
BASO%: 0.9 % (ref 0.0–2.0)
Basophils Absolute: 0 10*3/uL (ref 0.0–0.1)
EOS%: 0.9 % (ref 0.0–7.0)
Eosinophils Absolute: 0 10*3/uL (ref 0.0–0.5)
HCT: 38 % (ref 34.8–46.6)
HGB: 12 g/dL (ref 11.6–15.9)
LYMPH%: 53.6 % — ABNORMAL HIGH (ref 14.0–49.7)
MCH: 31.1 pg (ref 25.1–34.0)
MCHC: 31.6 g/dL (ref 31.5–36.0)
MCV: 98.4 fL (ref 79.5–101.0)
MONO#: 0.5 10*3/uL (ref 0.1–0.9)
MONO%: 11.3 % (ref 0.0–14.0)
NEUT#: 1.5 10*3/uL (ref 1.5–6.5)
NEUT%: 33.3 % — ABNORMAL LOW (ref 38.4–76.8)
Platelets: 172 10*3/uL (ref 145–400)
RBC: 3.86 10*6/uL (ref 3.70–5.45)
RDW: 14.6 % — ABNORMAL HIGH (ref 11.2–14.5)
WBC: 4.4 10*3/uL (ref 3.9–10.3)
lymph#: 2.4 10*3/uL (ref 0.9–3.3)
nRBC: 0 % (ref 0–0)

## 2012-09-29 LAB — PROTIME-INR
INR: 1.3 — ABNORMAL LOW (ref 2.00–3.50)
Protime: 15.6 Seconds — ABNORMAL HIGH (ref 10.6–13.4)

## 2012-10-19 ENCOUNTER — Other Ambulatory Visit: Payer: Self-pay | Admitting: *Deleted

## 2012-10-19 DIAGNOSIS — C9000 Multiple myeloma not having achieved remission: Secondary | ICD-10-CM

## 2012-10-19 MED ORDER — LENALIDOMIDE 10 MG PO CAPS: ORAL_CAPSULE | ORAL | Status: DC

## 2012-10-19 NOTE — Telephone Encounter (Signed)
RECEIVED A FAX FROM [facility] CONCERNING A CONFIRMATION OF PT.'S PRESCRIPTION BENEFITS. 

## 2012-10-19 NOTE — Telephone Encounter (Signed)
THIS REFILL REQUEST FOR REVLIMID WAS GIVEN TO DR.SALEM'S NURSE, ROBIN BASS,RN.

## 2012-10-19 NOTE — Addendum Note (Signed)
Addended by: Arvilla Meres on: 10/19/2012 01:39 PM   Modules accepted: Orders

## 2012-10-20 ENCOUNTER — Ambulatory Visit (HOSPITAL_BASED_OUTPATIENT_CLINIC_OR_DEPARTMENT_OTHER): Payer: Medicare Other | Admitting: Hematology and Oncology

## 2012-10-20 ENCOUNTER — Other Ambulatory Visit: Payer: Self-pay | Admitting: Medical Oncology

## 2012-10-20 ENCOUNTER — Other Ambulatory Visit (HOSPITAL_BASED_OUTPATIENT_CLINIC_OR_DEPARTMENT_OTHER): Payer: Medicare Other | Admitting: Lab

## 2012-10-20 ENCOUNTER — Ambulatory Visit (HOSPITAL_BASED_OUTPATIENT_CLINIC_OR_DEPARTMENT_OTHER): Payer: Medicare Other

## 2012-10-20 ENCOUNTER — Other Ambulatory Visit: Payer: Self-pay | Admitting: Hematology and Oncology

## 2012-10-20 VITALS — BP 146/69 | HR 60 | Temp 98.3°F | Resp 19 | Ht 64.0 in | Wt 165.8 lb

## 2012-10-20 DIAGNOSIS — C9000 Multiple myeloma not having achieved remission: Secondary | ICD-10-CM

## 2012-10-20 DIAGNOSIS — Z7901 Long term (current) use of anticoagulants: Secondary | ICD-10-CM

## 2012-10-20 DIAGNOSIS — Z9484 Stem cells transplant status: Secondary | ICD-10-CM

## 2012-10-20 DIAGNOSIS — G5690 Unspecified mononeuropathy of unspecified upper limb: Secondary | ICD-10-CM

## 2012-10-20 LAB — COMPREHENSIVE METABOLIC PANEL (CC13)
ALT: 22 U/L (ref 0–55)
AST: 16 U/L (ref 5–34)
Albumin: 3.4 g/dL — ABNORMAL LOW (ref 3.5–5.0)
Alkaline Phosphatase: 80 U/L (ref 40–150)
BUN: 14.5 mg/dL (ref 7.0–26.0)
CO2: 27 mEq/L (ref 22–29)
Calcium: 9.3 mg/dL (ref 8.4–10.4)
Chloride: 108 mEq/L (ref 98–109)
Creatinine: 0.9 mg/dL (ref 0.6–1.1)
Glucose: 98 mg/dl (ref 70–140)
Potassium: 3.9 mEq/L (ref 3.5–5.1)
Sodium: 141 mEq/L (ref 136–145)
Total Bilirubin: 0.4 mg/dL (ref 0.20–1.20)
Total Protein: 7.4 g/dL (ref 6.4–8.3)

## 2012-10-20 LAB — CBC WITH DIFFERENTIAL/PLATELET
BASO%: 0.2 % (ref 0.0–2.0)
Basophils Absolute: 0 10*3/uL (ref 0.0–0.1)
EOS%: 0.8 % (ref 0.0–7.0)
Eosinophils Absolute: 0 10*3/uL (ref 0.0–0.5)
HCT: 36.3 % (ref 34.8–46.6)
HGB: 11.5 g/dL — ABNORMAL LOW (ref 11.6–15.9)
LYMPH%: 43.5 % (ref 14.0–49.7)
MCH: 31.3 pg (ref 25.1–34.0)
MCHC: 31.7 g/dL (ref 31.5–36.0)
MCV: 98.9 fL (ref 79.5–101.0)
MONO#: 0.6 10*3/uL (ref 0.1–0.9)
MONO%: 10.9 % (ref 0.0–14.0)
NEUT#: 2.3 10*3/uL (ref 1.5–6.5)
NEUT%: 44.6 % (ref 38.4–76.8)
Platelets: 175 10*3/uL (ref 145–400)
RBC: 3.67 10*6/uL — ABNORMAL LOW (ref 3.70–5.45)
RDW: 14.9 % — ABNORMAL HIGH (ref 11.2–14.5)
WBC: 5.2 10*3/uL (ref 3.9–10.3)
lymph#: 2.2 10*3/uL (ref 0.9–3.3)
nRBC: 0 % (ref 0–0)

## 2012-10-20 LAB — PROTIME-INR
INR: 1.9 — ABNORMAL LOW (ref 2.00–3.50)
Protime: 22.8 Seconds — ABNORMAL HIGH (ref 10.6–13.4)

## 2012-10-20 LAB — LACTATE DEHYDROGENASE (CC13): LDH: 200 U/L (ref 125–245)

## 2012-10-20 MED ORDER — SODIUM CHLORIDE 0.9 % IV SOLN
Freq: Once | INTRAVENOUS | Status: AC
  Administered 2012-10-20: 12:00:00 via INTRAVENOUS

## 2012-10-20 MED ORDER — ZOLEDRONIC ACID 4 MG/100ML IV SOLN
4.0000 mg | Freq: Once | INTRAVENOUS | Status: AC
  Administered 2012-10-20: 4 mg via INTRAVENOUS
  Filled 2012-10-20: qty 100

## 2012-10-20 NOTE — Telephone Encounter (Signed)
RECEIVED A FAX FROM [facility] CONCERNING A CONFIRMATION OF PRESCRIPTION SHIPMENT OF REVLIMID ON 10/21/12.

## 2012-10-20 NOTE — Patient Instructions (Addendum)
Zoledronic Acid injection (Paget's Disease, Osteoporosis) What is this medicine? ZOLEDRONIC ACID (ZOE le dron ik AS id) lowers the amount of calcium loss from bone. It is used to treat Paget's disease and osteoporosis in women. This medicine may be used for other purposes; ask your health care provider or pharmacist if you have questions. What should I tell my health care provider before I take this medicine? They need to know if you have any of these conditions: -aspirin-sensitive asthma -dental disease -kidney disease -low levels of calcium in the blood -past surgery on the parathyroid gland or intestines -an unusual or allergic reaction to zoledronic acid, other medicines, foods, dyes, or preservatives -pregnant or trying to get pregnant -breast-feeding How should I use this medicine? This medicine is for infusion into a vein. It is given by a health care professional in a hospital or clinic setting. Talk to your pediatrician regarding the use of this medicine in children. This medicine is not approved for use in children. Overdosage: If you think you have taken too much of this medicine contact a poison control center or emergency room at once. NOTE: This medicine is only for you. Do not share this medicine with others. What if I miss a dose? It is important not to miss your dose. Call your doctor or health care professional if you are unable to keep an appointment. What may interact with this medicine? -certain antibiotics given by injection -NSAIDs, medicines for pain and inflammation, like ibuprofen or naproxen -some diuretics like bumetanide, furosemide -teriparatide This list may not describe all possible interactions. Give your health care provider a list of all the medicines, herbs, non-prescription drugs, or dietary supplements you use. Also tell them if you smoke, drink alcohol, or use illegal drugs. Some items may interact with your medicine. What should I watch for while  using this medicine? Visit your doctor or health care professional for regular checkups. It may be some time before you see the benefit from this medicine. Do not stop taking your medicine unless your doctor tells you to. Your doctor may order blood tests or other tests to see how you are doing. Women should inform their doctor if they wish to become pregnant or think they might be pregnant. There is a potential for serious side effects to an unborn child. Talk to your health care professional or pharmacist for more information. You should make sure that you get enough calcium and vitamin D while you are taking this medicine. Discuss the foods you eat and the vitamins you take with your health care professional. Some people who take this medicine have severe bone, joint, and/or muscle pain. This medicine may also increase your risk for a broken thigh bone. Tell your doctor right away if you have pain in your upper leg or groin. Tell your doctor if you have any pain that does not go away or that gets worse. What side effects may I notice from receiving this medicine? Side effects that you should report to your doctor or health care professional as soon as possible: -allergic reactions like skin rash, itching or hives, swelling of the face, lips, or tongue -breathing problems -changes in vision -feeling faint or lightheaded, falls -jaw burning, cramping, or pain -muscle cramps, stiffness, or weakness -trouble passing urine or change in the amount of urine Side effects that usually do not require medical attention (report to your doctor or health care professional if they continue or are bothersome): -bone, joint, or muscle pain -fever -  irritation at site where injected -loss of appetite -nausea, vomiting -stomach upset -tired This list may not describe all possible side effects. Call your doctor for medical advice about side effects. You may report side effects to FDA at 1-800-FDA-1088. Where  should I keep my medicine? This drug is given in a hospital or clinic and will not be stored at home. NOTE: This sheet is a summary. It may not cover all possible information. If you have questions about this medicine, talk to your doctor, pharmacist, or health care provider.  2013, [employer]. (09/14/2010 9:08:15 AM)  

## 2012-10-20 NOTE — Progress Notes (Signed)
CC:   [name], MD [name], [name] [name], MD  PROBLEM LIST:  1. Multiple myeloma diagnosed in August 2010. She has kappa light  chain disease. In addition, she was found to have monoclonal IgG  as well in her urine back at the time of diagnosis. Bone marrow  initially showed 50% plasma cells. Metastatic bone survey carried  out on March 30, 2009 was negative. Carmen Fisher was initially  treated with Velcade, Decadron and Doxil for 4-1/2 cycles from  04/24/2009 through 07/27/2009. She developed peripheral  neuropathy. Another bone marrow was carried out on 08/03/2009,  showed 30% plasma cells. Revlimid and Decadron were given from  09/01/2009 through December 2011. Bone marrow on March 01, 2010  showed 5% plasma cells. The patient then underwent high-dose  melphalan with autologous stem cell infusion on 04/26/2010. She  has had an excellent response to that treatment. Since June 2012,  she has been receiving maintenance Revlimid and Zometa. Serum  immunofixation electrophoresis from 10/26/2010 was negative.   Urine immunofixation electrophoresis from July 2012 continues to show  monoclonal free kappa light chains. Total urine protein was 125 mg  at that time. Back at the time of diagnosis in December 2010, the  24-hour urine protein was over 8 g. The patient's most recent  metastatic bone survey carried out at [facility] on 02/07/2011 was  Negative.   A 24 hour urine collection from 02/06/2012 yielded a protein of 704 mg  as compared with 218 mg on 07/30/2011, 125 mg on 04/19/2011 and 62 mg on  07/10/2010. The free kappa light chain excretion was 123.5 mg  suggesting that most of the protein is not the kappa light chain.  The urine kappa to lambda ratio was 31.88 and the urine  immunofixation electrophoresis was positive for monoclonal kappa light  chains. Urine protein to creatinine ratio was 0.04. Serum  immunofixation electrophoresis from 02/06/2012 showed no  monoclonal  protein. IgG level was 1530, IgA level 187 and IgM level 32, the latter  being low. Metastatic bone survey from 02/06/2012 showed no evidence  for metastatic disease.   A 24-hour urine collection from 04/30/2012 yielded 342 mg of protein, of which 304 mg were monoclonal kappa light chains.  Urine immunofixation electrophoresis was positive for monoclonal free kappa light chains.  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 Symptoms  developed in April 2011. The patient is currently on Neurontin.  3. Hypertension.  4. GERD.  5. Dyslipidemia.  6. Glaucoma.   MEDICATIONS:  Reviewed and recorded. Current Outpatient Prescriptions  Medication Sig Dispense Refill  . acetaminophen (TYLENOL) 500 MG tablet Take 500 mg by mouth every 6 (six) hours as needed.        Marland Kitchen allopurinol (ZYLOPRIM) 300 MG tablet TAKE ONE TABLET BY MOUTH EVERY DAY  30 tablet  2  . Alum & Mag Hydroxide-Simeth (MAGIC MOUTHWASH) SOLN Take 5 mLs by mouth as needed.        . gabapentin (NEURONTIN) 600 MG tablet Take 1 tablet (600 mg total) by mouth 2 (two) times daily.  120 tablet  3  . KLOR-CON M20 20 MEQ tablet TAKE ONE TABLET BY MOUTH TWICE DAILY  60 tablet  3  . latanoprost (XALATAN) 0.005 % ophthalmic solution 1 drop at bedtime.        Marland Kitchen lenalidomide (REVLIMID) 10 MG capsule TAKE ONE 10 mg CAPSULE DAILY BY MOUTH FOR 14 DAYS THEN REST 7 Days.  14 capsule  0  . LORazepam (ATIVAN)  1 MG tablet Take 1 mg by mouth every 4 (four) hours as needed.        Marland Kitchen oxyCODONE (OXY IR/ROXICODONE) 5 MG immediate release tablet Take 1 tablet (5 mg total) by mouth every 6 (six) hours as needed.  30 tablet  0  . pantoprazole (PROTONIX) 40 MG tablet TAKE ONE TABLET BY MOUTH EVERY DAY  30 tablet  2  . prochlorperazine (COMPAZINE) 10 MG tablet Take 10 mg by mouth every 6 (six) hours as needed.        . timolol (BETIMOL) 0.5 % ophthalmic solution 1 drop 2 (two) times daily.        Marland Kitchen VITAMIN D, ERGOCALCIFEROL, PO Take 1,000  Units by mouth 2 (two) times daily.       Marland Kitchen warfarin (COUMADIN) 6 MG tablet Take 6 mg by mouth daily. Coumadin managed by Dr Arline Asp       No current facility-administered medications for this visit.   TREATMENT PROGRAM: -Revlimid 10 mg daily, 2 weeks on, 1 week off. Revlimid was started  on 09/24/2010.  -Zometa 4 mg IV currently being given every 3 months. This was  started on 09/28/2010 and initially was given monthly.  -Coumadin 6 mg daily to maintain an INR of approximately 1.5. This  is being followed by the [facility].   IMMUNIZATIONS:  Pneumovax was given in July 2012.  Flu shot was given on February 12, 2011.  Inoculations following high-dose therapy and autologous stem cell  infusion were given on May 05, 2011.   SMOKING HISTORY: The patient has never smoked cigarettes.   HISTORY:  Carmen Fisher was seen today for followup of her kappa light chain multiple myeloma diagnosed in December 2010.  Carmen Fisher was last seen by [place] on 04/28/2012.  Clinically there has been no change in her condition.  She went on a cruise with her family back in February.  The patient continues to have some neuropathy with difficulty doing buttons. She occasionally has some minor low back pain.  Her overall condition really has not changed.  She denies any side effects from the IV Zometa that she  receives every 3 months.  She is on Revlimid 10 mg daily by mouth, 2 weeks out of every 3.  She is due to start Revlimid again today.  The patient is being seen at [facility] about once a year.    The patient is without any specific complaints today and seems to be doing Well except for the garde 1 neuropathy.  Blood pressure 146/69, pulse 60, temperature 98.3 F (36.8 C), temperature source Oral, resp. rate 19, height 5\' 4"  (1.626 m), weight 165 lb 12.8 oz (75.206 kg). PHYSICAL EXAMINATION:  General:  Carmen Fisher looks well.   HEENT:  There is no scleral icterus.  Mouth and pharynx are benign.   Lymph:  No peripheral adenopathy palpable.  Lungs: Clear to percussion and auscultation.  Cardiac:  Regular rhythm, with a possible soft early systolic ejection murmur.  Breasts:  Not examined. No axillary adenopathy.  Abdomen:  Benign, with no organomegaly or masses palpable.  Extremities:  No peripheral edema or clubbing. Neurologic:  Exam was normal.  Lines:  The patient does not have a Port- A-Cath or central catheter.  LABORATORY DATA:    CBC    Component Value Date/Time   WBC 5.2 10/20/2012 0900   WBC 3.2* 03/21/2010 1221   RBC 3.67* 10/20/2012 0900   RBC 3.38* 03/21/2010 1221   HGB 11.5* 10/20/2012  0900   HGB 10.3* 03/21/2010 1221   HCT 36.3 10/20/2012 0900   HCT 33.3* 03/21/2010 1221   PLT 175 10/20/2012 0900   PLT 203 03/21/2010 1221   MCV 98.9 10/20/2012 0900   MCV 98.5 03/21/2010 1221   MCH 31.3 10/20/2012 0900   MCH 30.6 04/16/2010 0940   MCHC 31.7 10/20/2012 0900   MCHC 30.9 03/21/2010 1221   RDW 14.9* 10/20/2012 0900   RDW 15.7* 03/21/2010 1221   LYMPHSABS 2.2 10/20/2012 0900   LYMPHSABS 1.0 03/21/2010 1221   MONOABS 0.6 10/20/2012 0900   MONOABS 0.2 03/21/2010 1221   EOSABS 0.0 10/20/2012 0900   EOSABS 0.1 03/21/2010 1221   BASOSABS 0.0 10/20/2012 0900   BASOSABS 0.0 03/21/2010 1221    Lab Results  Component Value Date   GLUCOSE 89 07/28/2012   BUN 15.0 07/28/2012   CO2 25 07/28/2012   ALT 21 07/28/2012   AST 19 07/28/2012   LDH 226 07/28/2012   K 4.2 07/28/2012   CREATININE 0.9 07/28/2012    IMAGING STUDIES:  1. Chest x-ray, 2 view, from 03/21/2010 showed a tortuous aorta and  scoliosis, otherwise negative.  2. Metastatic bone survey was carried out at [facility] on 02/07/2011. This  was negative. The report has been scanned into the Epic system.  3. Metastatic bone survey from 02/06/2012 showed no evidence for metastatic  disease. There were no lytic lesions suggestive of multiple myeloma.   IMPRESSION AND PLAN:  Carmen Fisher continues to do well, now approaching  3- 1/2 years from the time of diagnosis, which apparently was made in December 2010, when a bone marrow at that time showed 50% plasma cells. The patient had been referred to me because of anemia.  The patient is more than 2 years out from the time of high-dose melphalan and autologous stem cell transplant.  Carmen Fisher is due for Zometa today.  She receives this every 3 months. She will continue with Revlimid 10 mg daily, 2 weeks out of every 3. She will continue on the Coumadin to maintain an INR somewhere around 1.5 since she is allergic to ASA.  The patient was seen by Dr. Barbaraann Boys in May, 14, 2014.  We have asked Carmen Fisher to return to see [place] in about 3 months, which will be somewhere around 12/2012.   Zachery Dakins, MD 10/20/2012 11:06 AM

## 2012-10-21 ENCOUNTER — Other Ambulatory Visit: Payer: Self-pay | Admitting: Medical Oncology

## 2012-10-21 LAB — IGG, IGA, IGM
IgA: 324 mg/dL (ref 69–380)
IgG (Immunoglobin G), Serum: 1540 mg/dL (ref 690–1700)
IgM, Serum: 48 mg/dL — ABNORMAL LOW (ref 52–322)

## 2012-10-21 LAB — PROTEIN / CREATININE RATIO, URINE
Creatinine, Urine: 32.5 mg/dL
Total Protein, Urine: <3 mg/dL

## 2012-10-21 LAB — KAPPA/LAMBDA LIGHT CHAINS
Kappa free light chain: 5.36 mg/dL — ABNORMAL HIGH (ref 0.33–1.94)
Kappa:Lambda Ratio: 1.96 — ABNORMAL HIGH (ref 0.26–1.65)
Lambda Free Lght Chn: 2.73 mg/dL — ABNORMAL HIGH (ref 0.57–2.63)

## 2012-10-21 MED ORDER — WARFARIN SODIUM 6 MG PO TABS: 6.0000 mg | ORAL_TABLET | Freq: Every day | ORAL | Status: DC

## 2012-10-23 ENCOUNTER — Telehealth: Payer: Self-pay | Admitting: *Deleted

## 2012-10-23 ENCOUNTER — Telehealth: Payer: Self-pay | Admitting: Hematology and Oncology

## 2012-10-23 NOTE — Telephone Encounter (Signed)
Per staff message and POF I have scheduled appts.  JMW  

## 2012-10-23 NOTE — Telephone Encounter (Signed)
Talked to pt and gave her appt for August, September and October 2014, advised pt to get appt calendat on August

## 2012-10-29 ENCOUNTER — Other Ambulatory Visit: Payer: Self-pay | Admitting: Oncology

## 2012-10-29 DIAGNOSIS — C9000 Multiple myeloma not having achieved remission: Secondary | ICD-10-CM

## 2012-11-02 ENCOUNTER — Telehealth: Payer: Self-pay | Admitting: *Deleted

## 2012-11-02 NOTE — Telephone Encounter (Signed)
[facility] faxed Revlimid refill request.  Request to provider for review.

## 2012-11-05 ENCOUNTER — Other Ambulatory Visit: Payer: Self-pay | Admitting: *Deleted

## 2012-11-05 DIAGNOSIS — C9000 Multiple myeloma not having achieved remission: Secondary | ICD-10-CM

## 2012-11-05 MED ORDER — LENALIDOMIDE 10 MG PO CAPS: ORAL_CAPSULE | ORAL | Status: DC

## 2012-11-05 NOTE — Telephone Encounter (Signed)
Signed refill request for Revlimie faxed to [facility] at fax # [phone]

## 2012-11-10 ENCOUNTER — Other Ambulatory Visit (HOSPITAL_BASED_OUTPATIENT_CLINIC_OR_DEPARTMENT_OTHER): Payer: Medicare Other

## 2012-11-10 ENCOUNTER — Other Ambulatory Visit: Payer: Self-pay | Admitting: *Deleted

## 2012-11-10 DIAGNOSIS — C9000 Multiple myeloma not having achieved remission: Secondary | ICD-10-CM

## 2012-11-10 DIAGNOSIS — Z7901 Long term (current) use of anticoagulants: Secondary | ICD-10-CM

## 2012-11-10 LAB — CBC WITH DIFFERENTIAL/PLATELET
BASO%: 0.4 % (ref 0.0–2.0)
Basophils Absolute: 0 10*3/uL (ref 0.0–0.1)
EOS%: 1.3 % (ref 0.0–7.0)
Eosinophils Absolute: 0.1 10*3/uL (ref 0.0–0.5)
HCT: 36.1 % (ref 34.8–46.6)
HGB: 11.7 g/dL (ref 11.6–15.9)
LYMPH%: 43.1 % (ref 14.0–49.7)
MCH: 31.9 pg (ref 25.1–34.0)
MCHC: 32.4 g/dL (ref 31.5–36.0)
MCV: 98.4 fL (ref 79.5–101.0)
MONO#: 0.4 10*3/uL (ref 0.1–0.9)
MONO%: 9.7 % (ref 0.0–14.0)
NEUT#: 2.1 10*3/uL (ref 1.5–6.5)
NEUT%: 45.5 % (ref 38.4–76.8)
Platelets: 165 10*3/uL (ref 145–400)
RBC: 3.67 10*6/uL — ABNORMAL LOW (ref 3.70–5.45)
RDW: 14.9 % — ABNORMAL HIGH (ref 11.2–14.5)
WBC: 4.5 10*3/uL (ref 3.9–10.3)
lymph#: 2 10*3/uL (ref 0.9–3.3)
nRBC: 0 % (ref 0–0)

## 2012-11-10 LAB — PROTIME-INR
INR: 1.5 — ABNORMAL LOW (ref 2.00–3.50)
Protime: 18 Seconds — ABNORMAL HIGH (ref 10.6–13.4)

## 2012-11-10 MED ORDER — PANTOPRAZOLE SODIUM 40 MG PO TBEC: DELAYED_RELEASE_TABLET | ORAL | Status: DC

## 2012-11-11 ENCOUNTER — Ambulatory Visit: Payer: Medicare Other

## 2012-11-13 ENCOUNTER — Other Ambulatory Visit: Payer: Self-pay | Admitting: *Deleted

## 2012-11-13 DIAGNOSIS — C9000 Multiple myeloma not having achieved remission: Secondary | ICD-10-CM

## 2012-11-13 MED ORDER — ALLOPURINOL 300 MG PO TABS: ORAL_TABLET | ORAL | Status: DC

## 2012-12-01 ENCOUNTER — Other Ambulatory Visit: Payer: Self-pay

## 2012-12-01 ENCOUNTER — Other Ambulatory Visit: Payer: Self-pay | Admitting: *Deleted

## 2012-12-01 ENCOUNTER — Other Ambulatory Visit (HOSPITAL_BASED_OUTPATIENT_CLINIC_OR_DEPARTMENT_OTHER): Payer: Medicare Other

## 2012-12-01 DIAGNOSIS — C9000 Multiple myeloma not having achieved remission: Secondary | ICD-10-CM

## 2012-12-01 DIAGNOSIS — Z7901 Long term (current) use of anticoagulants: Secondary | ICD-10-CM

## 2012-12-01 LAB — CBC WITH DIFFERENTIAL/PLATELET
BASO%: 0.7 % (ref 0.0–2.0)
Basophils Absolute: 0 10*3/uL (ref 0.0–0.1)
EOS%: 1 % (ref 0.0–7.0)
Eosinophils Absolute: 0 10*3/uL (ref 0.0–0.5)
HCT: 33.5 % — ABNORMAL LOW (ref 34.8–46.6)
HGB: 10.9 g/dL — ABNORMAL LOW (ref 11.6–15.9)
LYMPH%: 46.4 % (ref 14.0–49.7)
MCH: 31.7 pg (ref 25.1–34.0)
MCHC: 32.5 g/dL (ref 31.5–36.0)
MCV: 97.4 fL (ref 79.5–101.0)
MONO#: 0.5 10*3/uL (ref 0.1–0.9)
MONO%: 12.7 % (ref 0.0–14.0)
NEUT#: 1.6 10*3/uL (ref 1.5–6.5)
NEUT%: 39.2 % (ref 38.4–76.8)
Platelets: 194 10*3/uL (ref 145–400)
RBC: 3.44 10*6/uL — ABNORMAL LOW (ref 3.70–5.45)
RDW: 14.8 % — ABNORMAL HIGH (ref 11.2–14.5)
WBC: 4.2 10*3/uL (ref 3.9–10.3)
lymph#: 1.9 10*3/uL (ref 0.9–3.3)
nRBC: 0 % (ref 0–0)

## 2012-12-01 LAB — PROTIME-INR
INR: 1.8 — ABNORMAL LOW (ref 2.00–3.50)
Protime: 21.6 Seconds — ABNORMAL HIGH (ref 10.6–13.4)

## 2012-12-01 MED ORDER — LENALIDOMIDE 10 MG PO CAPS: ORAL_CAPSULE | ORAL | Status: DC

## 2012-12-01 NOTE — Telephone Encounter (Signed)
Confirmed with patient that she is on Coumadin 6 mg daily. Due to begin Revlimid tomorrow.

## 2012-12-03 ENCOUNTER — Telehealth: Payer: Self-pay | Admitting: *Deleted

## 2012-12-03 DIAGNOSIS — C9000 Multiple myeloma not having achieved remission: Secondary | ICD-10-CM

## 2012-12-03 MED ORDER — LENALIDOMIDE 10 MG PO CAPS: ORAL_CAPSULE | ORAL | Status: DC

## 2012-12-03 NOTE — Telephone Encounter (Signed)
[facility] faxed Revlimid refill request.  Request to provider for review earlier today.  Now note refill was attempted on 12-01-2012 and has not reached pharmacy.  Will resend.

## 2012-12-04 ENCOUNTER — Telehealth: Payer: Self-pay | Admitting: Medical Oncology

## 2012-12-04 NOTE — Telephone Encounter (Signed)
Spoke with the Revlimid department of [facility] and confirmed receipt of order.  Revlimid was shipped and delivered 12-03-2012.

## 2012-12-04 NOTE — Telephone Encounter (Signed)
I attempted to call pt regarding her coumadin dose. Her answering machine if full. Will try to reach pt later.

## 2012-12-05 ENCOUNTER — Emergency Department (HOSPITAL_COMMUNITY): Payer: Medicare Other

## 2012-12-05 ENCOUNTER — Inpatient Hospital Stay (HOSPITAL_COMMUNITY): Payer: Medicare Other

## 2012-12-05 ENCOUNTER — Inpatient Hospital Stay (HOSPITAL_COMMUNITY)
Admission: EM | Admit: 2012-12-05 | Discharge: 2012-12-08 | DRG: 871 | Disposition: A | Discharge status: Home / Self Care | Payer: Medicare Other | Attending: Family Medicine | Admitting: Family Medicine

## 2012-12-05 ENCOUNTER — Encounter (HOSPITAL_COMMUNITY): Payer: Self-pay

## 2012-12-05 DIAGNOSIS — Z79899 Other long term (current) drug therapy: Secondary | ICD-10-CM

## 2012-12-05 DIAGNOSIS — I959 Hypotension, unspecified: Secondary | ICD-10-CM

## 2012-12-05 DIAGNOSIS — C9000 Multiple myeloma not having achieved remission: Secondary | ICD-10-CM | POA: Diagnosis present

## 2012-12-05 DIAGNOSIS — N390 Urinary tract infection, site not specified: Secondary | ICD-10-CM | POA: Diagnosis present

## 2012-12-05 DIAGNOSIS — J189 Pneumonia, unspecified organism: Secondary | ICD-10-CM | POA: Diagnosis present

## 2012-12-05 DIAGNOSIS — A419 Sepsis, unspecified organism: Principal | ICD-10-CM | POA: Diagnosis present

## 2012-12-05 DIAGNOSIS — K219 Gastro-esophageal reflux disease without esophagitis: Secondary | ICD-10-CM | POA: Diagnosis present

## 2012-12-05 DIAGNOSIS — G929 Unspecified toxic encephalopathy: Secondary | ICD-10-CM | POA: Diagnosis present

## 2012-12-05 DIAGNOSIS — R748 Abnormal levels of other serum enzymes: Secondary | ICD-10-CM

## 2012-12-05 DIAGNOSIS — E860 Dehydration: Secondary | ICD-10-CM | POA: Diagnosis present

## 2012-12-05 DIAGNOSIS — N179 Acute kidney failure, unspecified: Secondary | ICD-10-CM

## 2012-12-05 DIAGNOSIS — G92 Toxic encephalopathy: Secondary | ICD-10-CM | POA: Diagnosis present

## 2012-12-05 DIAGNOSIS — Z7901 Long term (current) use of anticoagulants: Secondary | ICD-10-CM

## 2012-12-05 DIAGNOSIS — R5081 Fever presenting with conditions classified elsewhere: Secondary | ICD-10-CM | POA: Diagnosis present

## 2012-12-05 DIAGNOSIS — T451X5A Adverse effect of antineoplastic and immunosuppressive drugs, initial encounter: Secondary | ICD-10-CM | POA: Diagnosis present

## 2012-12-05 DIAGNOSIS — N12 Tubulo-interstitial nephritis, not specified as acute or chronic: Secondary | ICD-10-CM | POA: Diagnosis present

## 2012-12-05 DIAGNOSIS — K7200 Acute and subacute hepatic failure without coma: Secondary | ICD-10-CM | POA: Diagnosis present

## 2012-12-05 DIAGNOSIS — D702 Other drug-induced agranulocytosis: Secondary | ICD-10-CM | POA: Diagnosis present

## 2012-12-05 DIAGNOSIS — R6520 Severe sepsis without septic shock: Secondary | ICD-10-CM | POA: Diagnosis present

## 2012-12-05 DIAGNOSIS — R4182 Altered mental status, unspecified: Secondary | ICD-10-CM

## 2012-12-05 DIAGNOSIS — D709 Neutropenia, unspecified: Secondary | ICD-10-CM | POA: Diagnosis present

## 2012-12-05 DIAGNOSIS — D72819 Decreased white blood cell count, unspecified: Secondary | ICD-10-CM | POA: Diagnosis present

## 2012-12-05 DIAGNOSIS — M810 Age-related osteoporosis without current pathological fracture: Secondary | ICD-10-CM | POA: Diagnosis present

## 2012-12-05 DIAGNOSIS — E876 Hypokalemia: Secondary | ICD-10-CM | POA: Diagnosis present

## 2012-12-05 HISTORY — DX: Gout, unspecified: M10.9

## 2012-12-05 HISTORY — DX: Hypokalemia: E87.6

## 2012-12-05 LAB — MAGNESIUM: Magnesium: 1.4 mg/dL — ABNORMAL LOW (ref 1.5–2.5)

## 2012-12-05 LAB — URINALYSIS, ROUTINE W REFLEX MICROSCOPIC
Bilirubin Urine: NEGATIVE
Glucose, UA: NEGATIVE mg/dL
Ketones, ur: NEGATIVE mg/dL
Nitrite: POSITIVE — AB
Protein, ur: NEGATIVE mg/dL
Specific Gravity, Urine: 1.013 (ref 1.005–1.030)
Urobilinogen, UA: 1 mg/dL (ref 0.0–1.0)
pH: 5.5 (ref 5.0–8.0)

## 2012-12-05 LAB — URINE MICROSCOPIC-ADD ON

## 2012-12-05 LAB — CBC WITH DIFFERENTIAL/PLATELET
Basophils Absolute: 0 10*3/uL (ref 0.0–0.1)
Basophils Relative: 1 % (ref 0–1)
Eosinophils Absolute: 0 10*3/uL (ref 0.0–0.7)
Eosinophils Relative: 1 % (ref 0–5)
HCT: 33 % — ABNORMAL LOW (ref 36.0–46.0)
Hemoglobin: 11.2 g/dL — ABNORMAL LOW (ref 12.0–15.0)
Lymphocytes Relative: 39 % (ref 12–46)
Lymphs Abs: 0.4 10*3/uL — ABNORMAL LOW (ref 0.7–4.0)
MCH: 32.7 pg (ref 26.0–34.0)
MCHC: 33.9 g/dL (ref 30.0–36.0)
MCV: 96.5 fL (ref 78.0–100.0)
Monocytes Absolute: 0 10*3/uL — ABNORMAL LOW (ref 0.1–1.0)
Monocytes Relative: 1 % — ABNORMAL LOW (ref 3–12)
Neutro Abs: 0.7 10*3/uL — ABNORMAL LOW (ref 1.7–7.7)
Neutrophils Relative %: 58 % (ref 43–77)
Platelets: 151 10*3/uL (ref 150–400)
RBC: 3.42 MIL/uL — ABNORMAL LOW (ref 3.87–5.11)
RDW: 14.9 % (ref 11.5–15.5)
WBC: 1.1 10*3/uL — CL (ref 4.0–10.5)

## 2012-12-05 LAB — POCT I-STAT 3, ART BLOOD GAS (G3+)
Bicarbonate: 22.8 mEq/L (ref 20.0–24.0)
O2 Saturation: 91 %
TCO2: 24 mmol/L (ref 0–100)
pCO2 arterial: 29.7 mmHg — ABNORMAL LOW (ref 35.0–45.0)
pH, Arterial: 7.493 — ABNORMAL HIGH (ref 7.350–7.450)
pO2, Arterial: 55 mmHg — ABNORMAL LOW (ref 80.0–100.0)

## 2012-12-05 LAB — CBC
HCT: 26 % — ABNORMAL LOW (ref 36.0–46.0)
Hemoglobin: 8.7 g/dL — ABNORMAL LOW (ref 12.0–15.0)
MCH: 32 pg (ref 26.0–34.0)
MCHC: 33.5 g/dL (ref 30.0–36.0)
MCV: 95.6 fL (ref 78.0–100.0)
Platelets: 118 10*3/uL — ABNORMAL LOW (ref 150–400)
RBC: 2.72 MIL/uL — ABNORMAL LOW (ref 3.87–5.11)
RDW: 15.1 % (ref 11.5–15.5)
WBC: 3.8 10*3/uL — ABNORMAL LOW (ref 4.0–10.5)

## 2012-12-05 LAB — COMPREHENSIVE METABOLIC PANEL
ALT: 65 U/L — ABNORMAL HIGH (ref 0–35)
ALT: 47 U/L — ABNORMAL HIGH (ref 0–35)
AST: 79 U/L — ABNORMAL HIGH (ref 0–37)
AST: 56 U/L — ABNORMAL HIGH (ref 0–37)
Albumin: 3.5 g/dL (ref 3.5–5.2)
Albumin: 2.5 g/dL — ABNORMAL LOW (ref 3.5–5.2)
Alkaline Phosphatase: 118 U/L — ABNORMAL HIGH (ref 39–117)
Alkaline Phosphatase: 76 U/L (ref 39–117)
BUN: 24 mg/dL — ABNORMAL HIGH (ref 6–23)
BUN: 21 mg/dL (ref 6–23)
CO2: 23 mEq/L (ref 19–32)
CO2: 19 mEq/L (ref 19–32)
Calcium: 9.7 mg/dL (ref 8.4–10.5)
Calcium: 7.7 mg/dL — ABNORMAL LOW (ref 8.4–10.5)
Chloride: 101 mEq/L (ref 96–112)
Chloride: 106 mEq/L (ref 96–112)
Creatinine, Ser: 1.69 mg/dL — ABNORMAL HIGH (ref 0.50–1.10)
Creatinine, Ser: 1.56 mg/dL — ABNORMAL HIGH (ref 0.50–1.10)
GFR calc Af Amer: 34 mL/min — ABNORMAL LOW (ref >90)
GFR calc Af Amer: 38 mL/min — ABNORMAL LOW (ref >90)
GFR calc non Af Amer: 30 mL/min — ABNORMAL LOW (ref >90)
GFR calc non Af Amer: 33 mL/min — ABNORMAL LOW (ref >90)
Glucose, Bld: 146 mg/dL — ABNORMAL HIGH (ref 70–99)
Glucose, Bld: 90 mg/dL (ref 70–99)
Potassium: 2.8 mEq/L — ABNORMAL LOW (ref 3.5–5.1)
Potassium: 2.7 mEq/L — CL (ref 3.5–5.1)
Sodium: 139 mEq/L (ref 135–145)
Sodium: 138 mEq/L (ref 135–145)
Total Bilirubin: 2.6 mg/dL — ABNORMAL HIGH (ref 0.3–1.2)
Total Bilirubin: 1.8 mg/dL — ABNORMAL HIGH (ref 0.3–1.2)
Total Protein: 7.6 g/dL (ref 6.0–8.3)
Total Protein: 5.7 g/dL — ABNORMAL LOW (ref 6.0–8.3)

## 2012-12-05 LAB — CG4 I-STAT (LACTIC ACID): Lactic Acid, Venous: 3.06 mmol/L — ABNORMAL HIGH (ref 0.5–2.2)

## 2012-12-05 LAB — TROPONIN I: Troponin I: <0.3 ng/mL (ref <0.30)

## 2012-12-05 LAB — LACTIC ACID, PLASMA
Lactic Acid, Venous: 3.2 mmol/L — ABNORMAL HIGH (ref 0.5–2.2)
Lactic Acid, Venous: 2.4 mmol/L — ABNORMAL HIGH (ref 0.5–2.2)

## 2012-12-05 LAB — POCT I-STAT TROPONIN I: Troponin i, poc: 0.03 ng/mL (ref 0.00–0.08)

## 2012-12-05 LAB — PROTIME-INR
INR: 1.79 — ABNORMAL HIGH (ref 0.00–1.49)
Prothrombin Time: 20.3 seconds — ABNORMAL HIGH (ref 11.6–15.2)

## 2012-12-05 LAB — PRO B NATRIURETIC PEPTIDE: Pro B Natriuretic peptide (BNP): 4832 pg/mL — ABNORMAL HIGH (ref 0–125)

## 2012-12-05 MED ORDER — SODIUM CHLORIDE 0.9 % IV SOLN
INTRAVENOUS | Status: DC
  Administered 2012-12-06: via INTRAVENOUS
  Filled 2012-12-05 (×2): qty 1000

## 2012-12-05 MED ORDER — LATANOPROST 0.005 % OP SOLN
1.0000 [drp] | Freq: Every day | OPHTHALMIC | Status: DC
  Administered 2012-12-05 – 2012-12-07 (×3): 1 [drp] via OPHTHALMIC
  Filled 2012-12-05: qty 2.5

## 2012-12-05 MED ORDER — SODIUM CHLORIDE 0.9 % IJ SOLN
3.0000 mL | Freq: Two times a day (BID) | INTRAMUSCULAR | Status: DC
  Administered 2012-12-06 – 2012-12-07 (×3): 3 mL via INTRAVENOUS

## 2012-12-05 MED ORDER — ACYCLOVIR 800 MG PO TABS
800.0000 mg | ORAL_TABLET | Freq: Two times a day (BID) | ORAL | Status: DC
  Administered 2012-12-05 – 2012-12-08 (×6): 800 mg via ORAL
  Filled 2012-12-05 (×7): qty 1

## 2012-12-05 MED ORDER — WARFARIN SODIUM 6 MG PO TABS
6.0000 mg | ORAL_TABLET | Freq: Once | ORAL | Status: AC
  Administered 2012-12-06: 6 mg via ORAL
  Filled 2012-12-05: qty 1

## 2012-12-05 MED ORDER — POTASSIUM CHLORIDE IN NACL 20-0.9 MEQ/L-% IV SOLN
INTRAVENOUS | Status: DC
  Administered 2012-12-05: 19:00:00 via INTRAVENOUS
  Filled 2012-12-05 (×2): qty 1000

## 2012-12-05 MED ORDER — VANCOMYCIN HCL IN DEXTROSE 750-5 MG/150ML-% IV SOLN
750.0000 mg | Freq: Two times a day (BID) | INTRAVENOUS | Status: DC
  Filled 2012-12-05 (×2): qty 150

## 2012-12-05 MED ORDER — VITAMIN D3 25 MCG (1000 UNIT) PO TABS
1000.0000 [IU] | ORAL_TABLET | Freq: Every day | ORAL | Status: DC
  Administered 2012-12-06 – 2012-12-08 (×3): 1000 [IU] via ORAL
  Filled 2012-12-05 (×3): qty 1

## 2012-12-05 MED ORDER — ADULT MULTIVITAMIN W/MINERALS CH
1.0000 | ORAL_TABLET | Freq: Every day | ORAL | Status: DC
  Administered 2012-12-05 – 2012-12-08 (×4): 1 via ORAL
  Filled 2012-12-05 (×4): qty 1

## 2012-12-05 MED ORDER — GABAPENTIN 600 MG PO TABS
600.0000 mg | ORAL_TABLET | Freq: Two times a day (BID) | ORAL | Status: DC
  Administered 2012-12-05 – 2012-12-06 (×2): 600 mg via ORAL
  Filled 2012-12-05 (×3): qty 1

## 2012-12-05 MED ORDER — IOHEXOL 300 MG/ML  SOLN
80.0000 mL | Freq: Once | INTRAMUSCULAR | Status: AC | PRN
  Administered 2012-12-05: 80 mL via INTRAVENOUS

## 2012-12-05 MED ORDER — GABAPENTIN 300 MG PO CAPS
600.0000 mg | ORAL_CAPSULE | Freq: Two times a day (BID) | ORAL | Status: DC
  Filled 2012-12-05: qty 2

## 2012-12-05 MED ORDER — ACETAMINOPHEN 325 MG PO TABS
650.0000 mg | ORAL_TABLET | Freq: Four times a day (QID) | ORAL | Status: DC | PRN
  Administered 2012-12-05 – 2012-12-06 (×2): 650 mg via ORAL
  Filled 2012-12-05 (×2): qty 2

## 2012-12-05 MED ORDER — MORPHINE SULFATE 2 MG/ML IJ SOLN: 2.0000 mg | INTRAMUSCULAR | Status: DC | PRN

## 2012-12-05 MED ORDER — PANTOPRAZOLE SODIUM 40 MG PO TBEC
40.0000 mg | DELAYED_RELEASE_TABLET | Freq: Every day | ORAL | Status: DC
  Administered 2012-12-05 – 2012-12-08 (×4): 40 mg via ORAL
  Filled 2012-12-05 (×4): qty 1

## 2012-12-05 MED ORDER — VANCOMYCIN HCL IN DEXTROSE 1-5 GM/200ML-% IV SOLN
1000.0000 mg | Freq: Once | INTRAVENOUS | Status: AC
  Administered 2012-12-05: 1000 mg via INTRAVENOUS
  Filled 2012-12-05: qty 200

## 2012-12-05 MED ORDER — IOHEXOL 300 MG/ML  SOLN
20.0000 mL | INTRAMUSCULAR | Status: AC
  Administered 2012-12-05: 25 mL via ORAL
  Administered 2012-12-05: 20 mL via ORAL

## 2012-12-05 MED ORDER — SODIUM CHLORIDE 0.9 % IV SOLN
INTRAVENOUS | Status: DC
  Administered 2012-12-05: 13:00:00 via INTRAVENOUS

## 2012-12-05 MED ORDER — SODIUM CHLORIDE 0.9 % IV BOLUS (SEPSIS)
1000.0000 mL | Freq: Once | INTRAVENOUS | Status: AC
  Administered 2012-12-05: 1000 mL via INTRAVENOUS

## 2012-12-05 MED ORDER — ACETAMINOPHEN 650 MG RE SUPP: 650.0000 mg | Freq: Four times a day (QID) | RECTAL | Status: DC | PRN

## 2012-12-05 MED ORDER — ONDANSETRON HCL 4 MG PO TABS: 4.0000 mg | ORAL_TABLET | Freq: Four times a day (QID) | ORAL | Status: DC | PRN

## 2012-12-05 MED ORDER — ONDANSETRON HCL 4 MG/2ML IJ SOLN: 4.0000 mg | Freq: Four times a day (QID) | INTRAMUSCULAR | Status: DC | PRN

## 2012-12-05 MED ORDER — POTASSIUM CHLORIDE CRYS ER 20 MEQ PO TBCR
40.0000 meq | EXTENDED_RELEASE_TABLET | Freq: Once | ORAL | Status: AC
  Administered 2012-12-06: 40 meq via ORAL
  Filled 2012-12-05: qty 2

## 2012-12-05 MED ORDER — WARFARIN - PHARMACIST DOSING INPATIENT: Freq: Every day | Status: DC

## 2012-12-05 MED ORDER — MAGNESIUM HYDROXIDE 400 MG/5ML PO SUSP: 30.0000 mL | Freq: Every day | ORAL | Status: DC | PRN

## 2012-12-05 MED ORDER — POTASSIUM CHLORIDE CRYS ER 20 MEQ PO TBCR
20.0000 meq | EXTENDED_RELEASE_TABLET | Freq: Two times a day (BID) | ORAL | Status: DC
  Administered 2012-12-05 – 2012-12-06 (×2): 20 meq via ORAL
  Filled 2012-12-05 (×3): qty 1

## 2012-12-05 MED ORDER — PIPERACILLIN-TAZOBACTAM 3.375 G IVPB
3.3750 g | Freq: Three times a day (TID) | INTRAVENOUS | Status: DC
  Administered 2012-12-05 – 2012-12-07 (×6): 3.375 g via INTRAVENOUS
  Filled 2012-12-05 (×8): qty 50

## 2012-12-05 MED ORDER — PIPERACILLIN-TAZOBACTAM 3.375 G IVPB
3.3750 g | Freq: Once | INTRAVENOUS | Status: AC
  Administered 2012-12-05: 3.375 g via INTRAVENOUS
  Filled 2012-12-05: qty 50

## 2012-12-05 MED ORDER — TIMOLOL HEMIHYDRATE 0.5 % OP SOLN: 1.0000 [drp] | Freq: Two times a day (BID) | OPHTHALMIC | Status: DC

## 2012-12-05 MED ORDER — VANCOMYCIN HCL IN DEXTROSE 750-5 MG/150ML-% IV SOLN
750.0000 mg | Freq: Two times a day (BID) | INTRAVENOUS | Status: DC
  Administered 2012-12-06 – 2012-12-07 (×3): 750 mg via INTRAVENOUS
  Filled 2012-12-05 (×4): qty 150

## 2012-12-05 MED ORDER — POTASSIUM CHLORIDE CRYS ER 20 MEQ PO TBCR
40.0000 meq | EXTENDED_RELEASE_TABLET | Freq: Once | ORAL | Status: AC
  Administered 2012-12-05: 40 meq via ORAL
  Filled 2012-12-05: qty 2

## 2012-12-05 MED ORDER — SODIUM CHLORIDE 0.9 % IV BOLUS (SEPSIS)
500.0000 mL | Freq: Once | INTRAVENOUS | Status: AC
  Administered 2012-12-05: 500 mL via INTRAVENOUS

## 2012-12-05 MED ORDER — TIMOLOL MALEATE 0.5 % OP SOLN
1.0000 [drp] | Freq: Two times a day (BID) | OPHTHALMIC | Status: DC
  Administered 2012-12-05 – 2012-12-08 (×6): 1 [drp] via OPHTHALMIC
  Filled 2012-12-05: qty 5

## 2012-12-05 MED ORDER — ALLOPURINOL 300 MG PO TABS
300.0000 mg | ORAL_TABLET | Freq: Every day | ORAL | Status: DC
  Administered 2012-12-06 – 2012-12-08 (×3): 300 mg via ORAL
  Filled 2012-12-05 (×3): qty 1

## 2012-12-05 MED ORDER — ACETAMINOPHEN 650 MG RE SUPP
650.0000 mg | Freq: Once | RECTAL | Status: AC
  Administered 2012-12-05: 650 mg via RECTAL
  Filled 2012-12-05: qty 1

## 2012-12-05 NOTE — H&P (Addendum)
History and Physical Examination  Carmen Fisher WUJ:[id number] DOB: [birth year] DOA: 12/05/2012  Referring physician: Freida Busman PCP: Merri Brunette, MD Specialists: [place], Oncology  Chief Complaint: Altered Mental Status  HPI: Carmen Fisher is a 72 y.o. female with a three-year history of multiple myeloma currently being treated by oral chemotherapeutic agents who presented to the emergency department with altered mental status and fever.  Pt spent the night at her mother's house last night. Per family, pt was acting normal last night. Pt states she had been having intermittent right side abdominal pain for the past 2 weeks. Pt states she had gotten up this morning to fix a cup of coffee and her mother told her she didn't look well and told her to go back to bed.  Later the patient's mother tried to wake pt and she was unresponsive.  EMS was activated.  EMS reported that pt was responsive to verbal stimulus and would open eyes and wiggle toes, but pt was non-verbal. EMS reports pt very hot to touch.  The patient also reports that she has been having diarrhea.   The patient was seen and evaluated in the emergency department.  She was found to be febrile with a temperature of 104.  She was confused.  She was given Tylenol for the fever.  She was started on IV fluids.  She began to show some improvement after that.  She was noted to be slightly hypotensive.  Her urinalysis was positive for significant infection.  She had an elevated lactic acid level.  She was noted to be adequately protecting her airways.  A hospital admission was requested for further evaluation and treatment.  Past Medical History Past Medical History  Diagnosis Date  . Multiple myeloma   . Chronic anticoagulation   . Glaucoma   . Gout   . Osteoporosis     Past Surgical History History reviewed. No pertinent past surgical history.  Home Meds: Prior to Admission medications   Medication Sig Start Date End Date Taking?  Authorizing Provider  acyclovir (ZOVIRAX) 800 MG tablet Take 800 mg by mouth 2 (two) times daily.   Yes Historical Provider, MD  allopurinol (ZYLOPRIM) 300 MG tablet Take 300 mg by mouth daily.   Yes Historical Provider, MD  cholecalciferol (VITAMIN D) 1000 UNITS tablet Take 1,000 Units by mouth daily.   Yes Historical Provider, MD  gabapentin (NEURONTIN) 300 MG capsule Take 600 mg by mouth 2 (two) times daily.   Yes Historical Provider, MD  latanoprost (XALATAN) 0.005 % ophthalmic solution Place 1 drop into both eyes at bedtime.   Yes Historical Provider, MD  Multiple Vitamin (MULTIVITAMIN WITH MINERALS) TABS tablet Take 1 tablet by mouth daily.   Yes Historical Provider, MD  pantoprazole (PROTONIX) 40 MG tablet Take 40 mg by mouth daily.   Yes Historical Provider, MD  potassium chloride SA (K-DUR,KLOR-CON) 20 MEQ tablet Take 20 mEq by mouth 2 (two) times daily.   Yes Historical Provider, MD  timolol (BETIMOL) 0.5 % ophthalmic solution Place 1 drop into both eyes 2 (two) times daily.   Yes Historical Provider, MD  warfarin (COUMADIN) 6 MG tablet Take 6 mg by mouth daily.   Yes Historical Provider, MD  zolendronic acid (ZOMETA) 4 MG/5ML injection Inject 4 mg into the vein every 3 (three) months.   Yes Historical Provider, MD   Allergies: Aspirin and Sulfa antibiotics  Social History:  History   Social History  . Marital Status: Widowed    Spouse Name:  N/A    Number of Children: N/A  . Years of Education: N/A   Occupational History  . Not on file.   Social History Main Topics  . Smoking status: Never Smoker   . Smokeless tobacco: Not on file  . Alcohol Use: No  . Drug Use: No  . Sexual Activity: Not on file   Other Topics Concern  . Not on file   Social History Narrative  . No narrative on file   Family History:  Family History  Problem Relation Age of Onset  . Hypertension      Review of Systems: The patient denies anorexia, fever, weight loss,, vision loss, decreased  hearing, hoarseness, chest pain, syncope, dyspnea on exertion, peripheral edema, balance deficits, hemoptysis, melena, hematochezia, severe indigestion/heartburn, hematuria, incontinence, genital sores, muscle weakness, suspicious skin lesions, transient blindness, difficulty walking, depression, unusual weight change, abnormal bleeding, enlarged lymph nodes, angioedema, and breast masses.   All other systems reviewed and reported as negative.   Physical Exam: Blood pressure 94/53, pulse 117, temperature 102.9 F (39.4 C), temperature source Core (Comment), resp. rate 26, SpO2 97.00%. General appearance: alert, cooperative, appears stated age and no distress Head: Normocephalic, without obvious abnormality, atraumatic Eyes: conjunctivae/corneas clear. PERRL, EOM's intact. Fundi benign. Nose: Nares normal. Septum midline. Mucosa normal. No drainage or sinus tenderness., no discharge Throat: dry mucous membranes noted. Neck: no adenopathy, no carotid bruit, no JVD, supple, symmetrical, trachea midline and thyroid not enlarged, symmetric, no tenderness/mass/nodules Lungs: clear to auscultation bilaterally and normal percussion bilaterally Heart: regular rate and rhythm, S1, S2 normal, no murmur, click, rub or gallop Abdomen: soft, nondistended, normal bowel sounds, RLQ TTP, no guarding or rebound TTP, suprapubic TTP Extremities: no significant edema Pulses: 2+ and symmetric Skin: Skin color, texture, turgor normal. No rashes or lesions Neurologic: Grossly normal, oriented x4  Lab  And Imaging results:  Results for orders placed during the hospital encounter of 12/05/12 (from the past 24 hour(s))  CBC WITH DIFFERENTIAL     Status: Abnormal   Collection Time    12/05/12 12:35 PM      Result Value Range   WBC 1.1 (*) 4.0 - 10.5 K/uL   RBC 3.42 (*) 3.87 - 5.11 MIL/uL   Hemoglobin 11.2 (*) 12.0 - 15.0 g/dL   HCT 16.1 (*) 09.6 - 04.5 %   MCV 96.5  78.0 - 100.0 fL   MCH 32.7  26.0 - 34.0 pg    MCHC 33.9  30.0 - 36.0 g/dL   RDW 40.9  81.1 - 91.4 %   Platelets 151  150 - 400 K/uL   Neutrophils Relative % 58  43 - 77 %   Lymphocytes Relative 39  12 - 46 %   Monocytes Relative 1 (*) 3 - 12 %   Eosinophils Relative 1  0 - 5 %   Basophils Relative 1  0 - 1 %   Neutro Abs 0.7 (*) 1.7 - 7.7 K/uL   Lymphs Abs 0.4 (*) 0.7 - 4.0 K/uL   Monocytes Absolute 0.0 (*) 0.1 - 1.0 K/uL   Eosinophils Absolute 0.0  0.0 - 0.7 K/uL   Basophils Absolute 0.0  0.0 - 0.1 K/uL   WBC Morphology MILD LEFT SHIFT (1-5% METAS, OCC MYELO, OCC BANDS)     Smear Review LARGE PLATELETS PRESENT    COMPREHENSIVE METABOLIC PANEL     Status: Abnormal   Collection Time    12/05/12 12:35 PM      Result Value Range  Sodium 139  135 - 145 mEq/L   Potassium 2.8 (*) 3.5 - 5.1 mEq/L   Chloride 101  96 - 112 mEq/L   CO2 23  19 - 32 mEq/L   Glucose, Bld 146 (*) 70 - 99 mg/dL   BUN 24 (*) 6 - 23 mg/dL   Creatinine, Ser 1.61 (*) 0.50 - 1.10 mg/dL   Calcium 9.7  8.4 - 09.6 mg/dL   Total Protein 7.6  6.0 - 8.3 g/dL   Albumin 3.5  3.5 - 5.2 g/dL   AST 79 (*) 0 - 37 U/L   ALT 65 (*) 0 - 35 U/L   Alkaline Phosphatase 118 (*) 39 - 117 U/L   Total Bilirubin 2.6 (*) 0.3 - 1.2 mg/dL   GFR calc non Af Amer 30 (*) >90 mL/min   GFR calc Af Amer 34 (*) >90 mL/min  POCT I-STAT TROPONIN I     Status: None   Collection Time    12/05/12 12:45 PM      Result Value Range   Troponin i, poc 0.03  0.00 - 0.08 ng/mL   Comment 3           CG4 I-STAT (LACTIC ACID)     Status: Abnormal   Collection Time    12/05/12 12:47 PM      Result Value Range   Lactic Acid, Venous 3.06 (*) 0.5 - 2.2 mmol/L  URINALYSIS, ROUTINE W REFLEX MICROSCOPIC     Status: Abnormal   Collection Time    12/05/12 12:51 PM      Result Value Range   Color, Urine AMBER (*) YELLOW   APPearance CLOUDY (*) CLEAR   Specific Gravity, Urine 1.013  1.005 - 1.030   pH 5.5  5.0 - 8.0   Glucose, UA NEGATIVE  NEGATIVE mg/dL   Hgb urine dipstick MODERATE (*) NEGATIVE    Bilirubin Urine NEGATIVE  NEGATIVE   Ketones, ur NEGATIVE  NEGATIVE mg/dL   Protein, ur NEGATIVE  NEGATIVE mg/dL   Urobilinogen, UA 1.0  0.0 - 1.0 mg/dL   Nitrite POSITIVE (*) NEGATIVE   Leukocytes, UA LARGE (*) NEGATIVE  URINE MICROSCOPIC-ADD ON     Status: Abnormal   Collection Time    12/05/12 12:51 PM      Result Value Range   WBC, UA 21-50  <3 WBC/hpf   RBC / HPF 3-6  <3 RBC/hpf   Bacteria, UA MANY (*) RARE  POCT I-STAT 3, BLOOD GAS (G3+)     Status: Abnormal   Collection Time    12/05/12  1:15 PM      Result Value Range   pH, Arterial 7.493 (*) 7.350 - 7.450   pCO2 arterial 29.7 (*) 35.0 - 45.0 mmHg   pO2, Arterial 55.0 (*) 80.0 - 100.0 mmHg   Bicarbonate 22.8  20.0 - 24.0 mEq/L   TCO2 24  0 - 100 mmol/L   O2 Saturation 91.0     Collection site RADIAL, ALLEN'S TEST ACCEPTABLE     Drawn by RT     Sample type ARTERIAL    LACTIC ACID, PLASMA     Status: Abnormal   Collection Time    12/05/12  1:32 PM      Result Value Range   Lactic Acid, Venous 3.2 (*) 0.5 - 2.2 mmol/L    Impression  UTI (lower urinary tract infection) with urosepsis - the patient has significant infection in the urine -Will culture urine and start empiric broad-spectrum antibiotic coverage -Admit  to the step down unit -Follow lactic acid  Hypotension / Dehydration -Aggressively hydrated with IV fluids as above -Monitor closely in the step down unit -Low threshold for transfer to ICU for pressor support if needed  Sepsis - as above   Altered mental status -Significant improvement after treatment of the high fever -Continue neuro checks  Leukopenia / Neutropenia with fever -continue to monitor CBC and ANC -Contact precautions -Her baseline white blood cell count is not available  RLQ Abdominal Pain - check CT abdomen pelvis  ARF (acute renal failure) -Assuming that this is an acute renal insufficiency as there are no baseline labs available -Will monitor serial BMP  Elevated liver  enzymes -Unsure of the patient's baseline liver enzymes -Will monitor  Multiple Myeloma -asked pharmacy to reconcile home meds so that we can resume home therapies  Chronic anticoagulation with warfarin - Pt reports that she has been taking this for 3 years for blood clot prevention -check PT/INR and ask pharmacy to monitor and adjust warfarin as needed   GERD - protonix  Hypokalemia - replacing IV - checking magnesium level  Code Status: full Family Communication: updated at bedside  Critical care time spent 45 mins  Standley Dakins MD [facility] [place], [place] [phone] 12/05/2012, 3:15 PM

## 2012-12-05 NOTE — Progress Notes (Signed)
12/05/12 2246  Vitals  BP ! 87/46 mmHg  bolus currently infusing

## 2012-12-05 NOTE — Plan of Care (Signed)
Problem: Phase I Progression Outcomes Goal: Pain controlled with appropriate interventions Outcome: Progressing Pt denies pain at this time will continue to monitor Goal: Voiding-avoid urinary catheter unless indicated Outcome: Not Progressing Pt has a foley cater in place for urinary monitoring with rapid fluid resuscitation.  Goal: Hemodynamically stable Outcome: Progressing VSS at this time

## 2012-12-05 NOTE — ED Notes (Signed)
Pt states she had bone cancer 3 years ago.  Pt states she continues to take PO chemo daily.

## 2012-12-05 NOTE — Progress Notes (Signed)
ANTIBIOTIC CONSULT NOTE - INITIAL  Pharmacy Consult for Vancomycin / Zosyn Indication: pneumonia / UTI / sepsis  Allergies  Allergen Reactions  . Aspirin Swelling  . Sulfa Antibiotics Swelling    Patient Measurements:   Total  Body Weight: 77.2kg Ht 66in  Vital Signs: Temp: 103.3 F (39.6 C) (09/06 1815) Temp src: Core (Comment) (09/06 1401) BP: 105/52 mmHg (09/06 1815) Pulse Rate: 121 (09/06 1815) Intake/Output from previous day:   Intake/Output from this shift: Total I/O In: -  Out: 500 [Urine:500]  Labs:  Recent Labs  12/05/12 1235  WBC 1.1*  HGB 11.2*  PLT 151  CREATININE 1.69*   CrCl is unknown because there is no height on file for the current visit. No results found for this basename: VANCOTROUGH, VANCOPEAK, VANCORANDOM, GENTTROUGH, GENTPEAK, GENTRANDOM, TOBRATROUGH, TOBRAPEAK, TOBRARND, AMIKACINPEAK, AMIKACINTROU, AMIKACIN,  in the last 72 hours   Microbiology: No results found for this or any previous visit (from the past 720 hour(s)).  Medical History: Past Medical History  Diagnosis Date  . Multiple myeloma   . Chronic anticoagulation   . Glaucoma   . Gout   . Osteoporosis   . Hypokalemia   . GERD (gastroesophageal reflux disease)      Assessment: Carmen Fisher presented to ED with mental status changes. She is  febrile with TM 104.2, WBC 1.1, UA cloudy with bacteria and nitrite +, Cxr posible with LLL infiltrate and BP 80/54 tachy 100s.  Will begin broad spectrum ABX with vancomycin and zosyn.    Goal of Therapy:  Vancomycin trough level 15-20 mcg/ml  Plan:   Zosyn 3.375GM IV q8 EI Vancomycin 750mg  IV q12hr  Leota Sauers Pharm.D. CPP, BCPS Clinical Pharmacist [phone] 12/05/2012 7:05 PM

## 2012-12-05 NOTE — ED Notes (Signed)
Intake total 4 L of NS.  Urine output 

## 2012-12-05 NOTE — Progress Notes (Signed)
ANTICOAGULATION CONSULT NOTE - Initial Consult  Pharmacy Consult for Warfarin Indication: DVT Px  Allergies  Allergen Reactions  . Aspirin Swelling  . Sulfa Antibiotics Swelling    Patient Measurements: Height: 5\' 6"  (167.6 cm) Weight: 170 lb 10.2 oz (77.4 kg) IBW/kg (Calculated) : 59.3   Vital Signs: Temp: 102.2 F (39 C) (09/06 1855) Temp src: Oral (09/06 1855) BP: 116/54 mmHg (09/06 1855) Pulse Rate: 122 (09/06 1855)  Labs:  Recent Labs  12/05/12 1235 12/05/12 1527  HGB 11.2*  --   HCT 33.0*  --   PLT 151  --   LABPROT  --  20.3*  INR  --  1.79*  CREATININE 1.69*  --     Estimated Creatinine Clearance: 32.5 ml/min (by C-G formula based on Cr of 1.69).   Medical History: Past Medical History  Diagnosis Date  . Multiple myeloma   . Chronic anticoagulation   . Glaucoma   . Gout   . Osteoporosis   . Hypokalemia   . GERD (gastroesophageal reflux disease)       Assessment: 72yof with Hx multiple myeloma has been on warfarin PTA for "blood clot prevention" per pt.  No bleeding noted, h/h and PLTC stable, but elevated LFTS which may cause sensitivity to warfarin.  Admit INR 1.79 and will continue Home dose warfarin 6mg  tonight given acute illness may cause pt to be more sensitive to warfarin.    Goal of Therapy:  INR 2-3 Monitor platelets by anticoagulation protocol: Yes   Plan:  Coumadin 6mg  po x1 tonight Daily protimes Monitor s/s bleeding  Leota Sauers Pharm.D. CPP, BCPS Clinical Pharmacist [phone] 12/05/2012 7:15 PM

## 2012-12-05 NOTE — Progress Notes (Signed)
eLink Physician-Brief Progress Note Patient Name: Carmen Fisher DOB: May 01, 1940 MRN: [id number]  Date of Service  12/05/2012   HPI/Events of Note   Labs reviewed; lactic acid improving; hypokalemia, acute renal failure improving   eICU Interventions  Cont fluid resuscitation; k replaced by primary team  Magnesium pending       Zigmond Trela 12/05/2012, 10:32 PM

## 2012-12-05 NOTE — Progress Notes (Signed)
eLink Physician-Brief Progress Note Patient Name: Carmen Fisher DOB: 1940-11-05 MRN: [id number]  Date of Service  12/05/2012   HPI/Events of Note  72 yo with multiple myeloma, diagnosed 3yago, on treatment with unknown oral chemotherapy, presented to the hospital with sepsis and neutropenia, sec to PNA and UTI, hypotension which improved with fluid resuscitation and AMS which improved as well. Received 2L fluid bolus, last BP 102/54, HR 124, T 102.7, down from 104.2. Received broad spectrum antibiotics Vanc and Zosyn.    eICU Interventions  Would cont aggressive fluid resuscitation and reassess BP and HR as well as lactic acid. Would obtain ABG and repeat common labs, CBC and CMP; If not improving with 2L additional fluid will ask PCCM floor doctor to evaluate for ICU admission.       Carmen Fisher 12/05/2012, 5:00 PM

## 2012-12-05 NOTE — Progress Notes (Signed)
12/05/12 2345  Vitals  BP ! 74/44 mmHg (md aware (Dr. Belinda Block))  Talked to Dr. Belinda Block about pt BP after a 500 cc bolus she ordered 1000cc, bolus will continue to monitor

## 2012-12-05 NOTE — ED Provider Notes (Signed)
CSN: [id number]     Arrival date & time 12/05/12  1216 History   First MD Initiated Contact with Patient 12/05/12 1226     No chief complaint on file.  (Consider location/radiation/quality/duration/timing/severity/associated sxs/prior Treatment) The history is provided by the EMS personnel and a relative. The history is limited by the condition of the patient.   patient here with altered mental status this morning. Last seen normal yesterday and when her family started this morning she was not as responsive. She is breathing appropriately. EMS was called and patient felt warm to the touch. Should his CBG over 70 and blood pressure had a systolic over 100. She was transported here for further treatment  No past medical history on file. No past surgical history on file. No family history on file. History  Substance Use Topics  . Smoking status: Not on file  . Smokeless tobacco: Not on file  . Alcohol Use: Not on file   OB History   No data available     Review of Systems  Unable to perform ROS   Allergies  Review of patient's allergies indicates not on file.  Home Medications  No current outpatient prescriptions on file. There were no vitals taken for this visit. Physical Exam  Nursing note and vitals reviewed. Constitutional: She appears well-developed and well-nourished. She appears lethargic. She has a sickly appearance. She appears ill.  HENT:  Head: Normocephalic and atraumatic.  Eyes: Conjunctivae, EOM and lids are normal. Pupils are equal, round, and reactive to light.  Neck: Normal range of motion. Neck supple. No tracheal deviation present. No mass present.  Cardiovascular: Normal rate, regular rhythm and normal heart sounds.  Exam reveals no gallop.   No murmur heard. Pulmonary/Chest: Effort normal and breath sounds normal. No stridor. No respiratory distress. She has no decreased breath sounds. She has no wheezes. She has no rhonchi. She has no rales.  Abdominal:  Soft. Normal appearance and bowel sounds are normal. She exhibits no distension. There is no tenderness. There is no rebound and no CVA tenderness.  Musculoskeletal: Normal range of motion. She exhibits no edema and no tenderness.  Neurological: She appears lethargic. No cranial nerve deficit or sensory deficit. GCS eye subscore is 4. GCS verbal subscore is 1. GCS motor subscore is 5.  Patient withdraws to pain in all 4 extremities  Skin: Skin is warm and dry. No abrasion and no rash noted.    ED Course  Procedures (including critical care time) Labs Review Labs Reviewed  CULTURE, BLOOD (ROUTINE X 2)  CULTURE, BLOOD (ROUTINE X 2)  URINE CULTURE  LACTIC ACID, PLASMA  CBC WITH DIFFERENTIAL  COMPREHENSIVE METABOLIC PANEL  BLOOD GAS, ARTERIAL  URINALYSIS, ROUTINE W REFLEX MICROSCOPIC   Imaging Review No results found.  MDM  No diagnosis found.   Date: 12/05/2012  Rate: 120  Rhythm: sinus tachycardia  QRS Axis: normal  Intervals: QT prolonged  ST/T Wave abnormalities: nonspecific T wave changes  Conduction Disutrbances:nonspecific intraventricular conduction delay  Narrative Interpretation:   Old EKG Reviewed: none available  Patient given IV fluids here and urinalysis shows infection. Chest x-ray with possible pneumonia. Patient reassessed multiple times and able to protect her airway. Patient given Tylenol for her temperature and proximally 1 hour afterwards is not alert and oriented x4. She states that she has had some dysuria as well as some nonproductive cough. Symptoms have been present for the past 24 hours. Patient started on vancomycin and Zosyn.  patient had one episode  of hypotension that was treated with IV fluids. Lactate was elevated. Patient's leukopenia noted on CBC. Considered calling code sepsis but did not this time because clinically she her mental status has improved dramatically. Will consult [facility] for admission   CRITICAL CARE Performed by:  Toy Baker Total critical care time: 60 Critical care time was exclusive of separately billable procedures and treating other patients. Critical care was necessary to treat or prevent imminent or life-threatening deterioration. Critical care was time spent personally by me on the following activities: development of treatment plan with patient and/or surrogate as well as nursing, discussions with consultants, evaluation of patient's response to treatment, examination of patient, obtaining history from patient or surrogate, ordering and performing treatments and interventions, ordering and review of laboratory studies, ordering and review of radiographic studies, pulse oximetry and re-evaluation of patient's condition.      Toy Baker, MD 12/05/12 [phone]

## 2012-12-05 NOTE — ED Notes (Signed)
Pt now verbal.  Pt states she had been having R side abdominal pain.  Pt states she had gotten up this morning to fix a cup of coffee and her mother told her she didn't look well and told her to go back to bed.  Short time later is when mother tried to wake pt and she was unresponsive.

## 2012-12-05 NOTE — ED Notes (Signed)
Dr Laural Benes talked with PCCM MD.  They advised to hang another bag of fluids and monitor pt's BP.  If BP remains stable, pt to go to [place].

## 2012-12-05 NOTE — Progress Notes (Signed)
I came to reassess patient in ER, she is currently mentating well and protecting airway, SBP 100, HR, 122, I called and spoke with PCCM MD and she reviewed pt's chart and recommended challenging pt with another 2 liters of fluid, She recommended SDU with camera check and they will follow peripherally and monitor for any signs of decompensation and will gladly accept patient as needed.   I spoke with nurse in SDU and she said that she did not want to accept patient until she has had 2 more liters of fluid in ED and if she continues to improve will be able to take patient.  She said she will be sending a rapid response nurse down to ER to evaluate patient.  I conveyed information to ER nurse.    Maryln Manuel, MD

## 2012-12-05 NOTE — ED Notes (Signed)
Pt spent the night at her mother's house last night.  Per family, pt was normal last night.  EMS reports that family usually wakes up around 10.  At 11 when pt was still not up family checked on her and she was unresponsive.  EMS reports that pt was responsive to verbal stimulus and would open eyes and wiggle toes, but pt was non-verbal.  EMS reports pt very hot to touch.

## 2012-12-05 NOTE — ED Notes (Signed)
Dr. Laural Benes from [facility] at bedside.  He is going to consult with PCCM regarding this pt.

## 2012-12-05 NOTE — ED Notes (Signed)
Critical WBC 1.1. MD and Primary RN notified.

## 2012-12-06 ENCOUNTER — Inpatient Hospital Stay (HOSPITAL_COMMUNITY): Payer: Medicare Other

## 2012-12-06 LAB — MRSA PCR SCREENING: MRSA by PCR: NEGATIVE

## 2012-12-06 LAB — COMPREHENSIVE METABOLIC PANEL
ALT: 58 U/L — ABNORMAL HIGH (ref 0–35)
AST: 82 U/L — ABNORMAL HIGH (ref 0–37)
Albumin: 2.7 g/dL — ABNORMAL LOW (ref 3.5–5.2)
Alkaline Phosphatase: 78 U/L (ref 39–117)
BUN: 18 mg/dL (ref 6–23)
CO2: 16 mEq/L — ABNORMAL LOW (ref 19–32)
Calcium: 7.5 mg/dL — ABNORMAL LOW (ref 8.4–10.5)
Chloride: 109 mEq/L (ref 96–112)
Creatinine, Ser: 1.39 mg/dL — ABNORMAL HIGH (ref 0.50–1.10)
GFR calc Af Amer: 43 mL/min — ABNORMAL LOW (ref >90)
GFR calc non Af Amer: 37 mL/min — ABNORMAL LOW (ref >90)
Glucose, Bld: 131 mg/dL — ABNORMAL HIGH (ref 70–99)
Potassium: 4.3 mEq/L (ref 3.5–5.1)
Sodium: 138 mEq/L (ref 135–145)
Total Bilirubin: 1.5 mg/dL — ABNORMAL HIGH (ref 0.3–1.2)
Total Protein: 6.4 g/dL (ref 6.0–8.3)

## 2012-12-06 LAB — CBC WITH DIFFERENTIAL/PLATELET
Basophils Absolute: 0 10*3/uL (ref 0.0–0.1)
Basophils Relative: 0 % (ref 0–1)
Eosinophils Absolute: 0.3 10*3/uL (ref 0.0–0.7)
Eosinophils Relative: 4 % (ref 0–5)
HCT: 25.7 % — ABNORMAL LOW (ref 36.0–46.0)
Hemoglobin: 8.6 g/dL — ABNORMAL LOW (ref 12.0–15.0)
Lymphocytes Relative: 17 % (ref 12–46)
Lymphs Abs: 1.4 10*3/uL (ref 0.7–4.0)
MCH: 32 pg (ref 26.0–34.0)
MCHC: 33.5 g/dL (ref 30.0–36.0)
MCV: 95.5 fL (ref 78.0–100.0)
Monocytes Absolute: 0.3 10*3/uL (ref 0.1–1.0)
Monocytes Relative: 3 % (ref 3–12)
Neutro Abs: 6.3 10*3/uL (ref 1.7–7.7)
Neutrophils Relative %: 76 % (ref 43–77)
Platelets: 126 10*3/uL — ABNORMAL LOW (ref 150–400)
RBC: 2.69 MIL/uL — ABNORMAL LOW (ref 3.87–5.11)
RDW: 15.6 % — ABNORMAL HIGH (ref 11.5–15.5)
WBC Morphology: INCREASED
WBC: 8.3 10*3/uL (ref 4.0–10.5)

## 2012-12-06 LAB — CLOSTRIDIUM DIFFICILE BY PCR: Toxigenic C. Difficile by PCR: NEGATIVE

## 2012-12-06 LAB — TROPONIN I
Troponin I: <0.3 ng/mL (ref <0.30)
Troponin I: <0.3 ng/mL (ref <0.30)

## 2012-12-06 LAB — PROTIME-INR
INR: 1.77 — ABNORMAL HIGH (ref 0.00–1.49)
Prothrombin Time: 20.1 seconds — ABNORMAL HIGH (ref 11.6–15.2)

## 2012-12-06 LAB — MAGNESIUM: Magnesium: 1.7 mg/dL (ref 1.5–2.5)

## 2012-12-06 LAB — LACTIC ACID, PLASMA: Lactic Acid, Venous: 2.3 mmol/L — ABNORMAL HIGH (ref 0.5–2.2)

## 2012-12-06 MED ORDER — HYDROCORTISONE SOD SUCCINATE 100 MG IJ SOLR
50.0000 mg | Freq: Three times a day (TID) | INTRAMUSCULAR | Status: DC
  Administered 2012-12-06 – 2012-12-07 (×3): 50 mg via INTRAVENOUS
  Filled 2012-12-06 (×5): qty 1

## 2012-12-06 MED ORDER — SODIUM CHLORIDE 0.9 % IV SOLN
INTRAVENOUS | Status: DC
  Administered 2012-12-06 (×2): via INTRAVENOUS

## 2012-12-06 MED ORDER — GABAPENTIN 600 MG PO TABS
300.0000 mg | ORAL_TABLET | Freq: Two times a day (BID) | ORAL | Status: DC
  Filled 2012-12-06: qty 0.5

## 2012-12-06 MED ORDER — HYDROCORTISONE SOD SUCCINATE 100 MG IJ SOLR
50.0000 mg | Freq: Four times a day (QID) | INTRAMUSCULAR | Status: DC
  Administered 2012-12-06 (×3): 50 mg via INTRAVENOUS
  Filled 2012-12-06 (×6): qty 1

## 2012-12-06 MED ORDER — GABAPENTIN 300 MG PO CAPS
300.0000 mg | ORAL_CAPSULE | Freq: Two times a day (BID) | ORAL | Status: DC
  Administered 2012-12-06 – 2012-12-08 (×4): 300 mg via ORAL
  Filled 2012-12-06 (×5): qty 1

## 2012-12-06 MED ORDER — ALUM & MAG HYDROXIDE-SIMETH 200-200-20 MG/5ML PO SUSP
30.0000 mL | Freq: Four times a day (QID) | ORAL | Status: DC | PRN
  Administered 2012-12-06: 30 mL via ORAL

## 2012-12-06 NOTE — Progress Notes (Signed)
12/06/12 0020  Vitals  BP ! 75/48 mmHg   1 liter bolus going will continue to monitor

## 2012-12-06 NOTE — Progress Notes (Signed)
[facility] Progress Note Carmen Fisher TEAM 1 - Stepdown/ICU TEAM   Carmen Fisher EAV:[id number] DOB: 07-26-1940 DOA: 12/05/2012 PCP: Londell Moh, MD  Admit HPI / Brief Narrative: 72 y.o. female with a three-year history of multiple myeloma currently being treated by oral chemotherapeutic agents who presented to the emergency department with altered mental status and fever. Pt spent the night at her mother's house. Per family, pt was originally acting normal. Pt states she had been having intermittent right side abdominal pain for the past 2 weeks. Pt states she had gotten up this morning to fix a cup of coffee and her mother told her she didn't look well and told her to go back to bed. Later the patient's mother tried to wake pt and she was unresponsive. EMS was activated. EMS reported that pt was responsive to verbal stimulus and would open eyes and wiggle toes, but pt was non-verbal. EMS reports pt very hot to touch. The patient also reports that she has been having diarrhea.   The patient was seen and evaluated in the emergency department. She was found to be febrile with a temperature of 104. She was confused. She was given Tylenol for the fever. She was started on IV fluids. She began to show some improvement after that. She was noted to be slightly hypotensive. Her urinalysis was positive for significant infection. She had an elevated lactic acid level. She was noted to be adequately protecting her airways. A hospital admission was requested for further evaluation and treatment.  Assessment/Plan:  Sepsis due to Gram neg rod pyelonephritis with bacteremia Clinically improving - lactate slowly decreasing - cont to hydrate - cont empiric abx - f/u cx data   Hypotension  Resolved w/ volume expansion and abx   Toxic metabolic encephalopathy Due to above - much improved/returning to baseline mental status   Acute renal failure  Due to above - improving with volume  expansion  RLQ Abdominal Pain Likely due to pyelo - CT w/o evidence of abscess but did reveal non-obstructive R renal pelvis stone - sx resolved today   Chronic anticoagulation with warfarin Pharmacy to continue  Elevated liver enzymes Likely shock liver - recheck in AM  MM Followed at [facility]  Code Status: FULL Family Communication: spoke w/ multiple family members at bedside Disposition Plan: SDU  Consultants: none  Procedures: none  Antibiotics: Acyclovir 9/6 >> Zosyn 9/6 >> Vanc 9/6 >>  DVT prophylaxis: warfarin  HPI/Subjective: Pt is awake and alert.  She denies any further abdom pain.  She denies sob, cp, n/v, or ha.  Objective: Blood pressure 130/73, pulse 87, temperature 99.5 F (37.5 C), temperature source Oral, resp. rate 19, height 5\' 6"  (1.676 m), weight 77.4 kg (170 lb 10.2 oz), SpO2 98.00%.  Intake/Output Summary (Last 24 hours) at 12/06/12 1450 Last data filed at 12/06/12 1346  Gross per 24 hour  Intake 2377.5 ml  Output   3555 ml  Net -1177.5 ml   Exam: General: No acute respiratory distress Lungs: Clear to auscultation bilaterally without wheezes or crackles Cardiovascular: Regular rate and rhythm without murmur gallop or rub normal S1 and S2 Abdomen: Nontender, nondistended, soft, bowel sounds positive, no rebound, no ascites, no appreciable mass Extremities: No significant cyanosis, clubbing, or edema bilateral lower extremities  Data Reviewed: Basic Metabolic Panel:  Recent Labs Lab 12/05/12 1235 12/05/12 2029 12/05/12 2321 12/06/12 0530  NA 139 138  --  138  K 2.8* 2.7*  --  4.3  CL  101 106  --  109  CO2 23 19  --  16*  GLUCOSE 146* 90  --  131*  BUN 24* 21  --  18  CREATININE 1.69* 1.56*  --  1.39*  CALCIUM 9.7 7.7*  --  7.5*  MG  --   --  1.4* 1.7   Liver Function Tests:  Recent Labs Lab 12/05/12 1235 12/05/12 2029 12/06/12 0530  AST 79* 56* 82*  ALT 65* 47* 58*  ALKPHOS 118* 76 78  BILITOT 2.6* 1.8*  1.5*  PROT 7.6 5.7* 6.4  ALBUMIN 3.5 2.5* 2.7*   CBC:  Recent Labs Lab 12/05/12 1235 12/05/12 2029 12/06/12 0530  WBC 1.1* 3.8* 8.3  NEUTROABS 0.7*  --  6.3  HGB 11.2* 8.7* 8.6*  HCT 33.0* 26.0* 25.7*  MCV 96.5 95.6 95.5  PLT 151 118* 126*   Cardiac Enzymes:  Recent Labs Lab 12/05/12 2020 12/06/12 0116 12/06/12 0720  TROPONINI <0.30 <0.30 <0.30   BNP (last 3 results)  Recent Labs  12/05/12 2020  PROBNP 4832.0*    Recent Results (from the past 240 hour(s))  CLOSTRIDIUM DIFFICILE BY PCR     Status: None   Collection Time    12/06/12  4:00 AM      Result Value Range Status   C difficile by pcr NEGATIVE  NEGATIVE Final  MRSA PCR SCREENING     Status: None   Collection Time    12/06/12  5:44 AM      Result Value Range Status   MRSA by PCR NEGATIVE  NEGATIVE Final   Comment:            The GeneXpert MRSA Assay (FDA     approved for NASAL specimens     only), is one component of a     comprehensive MRSA colonization     surveillance program. It is not     intended to diagnose MRSA     infection nor to guide or     monitor treatment for     MRSA infections.     Studies:  Recent x-ray studies have been reviewed in detail by the Attending Physician  Scheduled Meds:  Scheduled Meds: . acyclovir  800 mg Oral BID  . allopurinol  300 mg Oral Daily  . cholecalciferol  1,000 Units Oral Daily  . gabapentin  600 mg Oral BID  . hydrocortisone sod succinate (SOLU-CORTEF) inj  50 mg Intravenous Q6H  . latanoprost  1 drop Both Eyes QHS  . multivitamin with minerals  1 tablet Oral Daily  . pantoprazole  40 mg Oral Daily  . piperacillin-tazobactam (ZOSYN)  IV  3.375 g Intravenous Q8H  . potassium chloride SA  20 mEq Oral BID  . sodium chloride  3 mL Intravenous Q12H  . timolol  1 drop Both Eyes BID  . vancomycin  750 mg Intravenous Q12H  . warfarin  6 mg Oral ONCE-1800  . Warfarin - Pharmacist Dosing Inpatient   Does not apply q1800    Time spent on care of  this patient: 35 mins   [facility]  [facility] Office  [phone] Pager - Text Page per Loretha Stapler as per below:  On-Call/Text Page:      [email or web address]      password [email or web address]  If 7PM-7AM, please contact night-coverage [email or web address] Password [email or web address] 12/06/2012, 2:50 PM   LOS: 1 day

## 2012-12-06 NOTE — Progress Notes (Signed)
12/06/12 0100  Vitals  BP ! 87/53 mmHg  post bolus pressure will continue to monitor

## 2012-12-06 NOTE — Progress Notes (Signed)
ANTICOAGULATION CONSULT NOTE - Follow Up Consult  Pharmacy Consult for Coumadin Indication: DVT prophylaxis  Allergies  Allergen Reactions  . Aspirin Swelling  . Sulfa Antibiotics Swelling    Patient Measurements: Height: 5\' 6"  (167.6 cm) Weight: 170 lb 10.2 oz (77.4 kg) IBW/kg (Calculated) : 59.3  Vital Signs: Temp: 98.6 F (37 C) (09/07 0725) Temp src: Oral (09/07 0725) BP: 111/76 mmHg (09/07 0725) Pulse Rate: 78 (09/07 0725)  Labs:  Recent Labs  12/05/12 1235 12/05/12 1527 12/05/12 2020 12/05/12 2029 12/06/12 0116 12/06/12 0530 12/06/12 0720  HGB 11.2*  --   --  8.7*  --  8.6*  --   HCT 33.0*  --   --  26.0*  --  25.7*  --   PLT 151  --   --  118*  --  126*  --   LABPROT  --  20.3*  --   --   --  20.1*  --   INR  --  1.79*  --   --   --  1.77*  --   CREATININE 1.69*  --   --  1.56*  --  1.39*  --   TROPONINI  --   --  <0.30  --  <0.30  --  <0.30    Estimated Creatinine Clearance: 39.5 ml/min (by C-G formula based on Cr of 1.39).   Medications:  Scheduled:  . acyclovir  800 mg Oral BID  . allopurinol  300 mg Oral Daily  . cholecalciferol  1,000 Units Oral Daily  . gabapentin  600 mg Oral BID  . hydrocortisone sod succinate (SOLU-CORTEF) inj  50 mg Intravenous Q6H  . latanoprost  1 drop Both Eyes QHS  . multivitamin with minerals  1 tablet Oral Daily  . pantoprazole  40 mg Oral Daily  . piperacillin-tazobactam (ZOSYN)  IV  3.375 g Intravenous Q8H  . potassium chloride SA  20 mEq Oral BID  . sodium chloride  3 mL Intravenous Q12H  . timolol  1 drop Both Eyes BID  . vancomycin  750 mg Intravenous Q12H  . warfarin  6 mg Oral ONCE-1800  . Warfarin - Pharmacist Dosing Inpatient   Does not apply q1800   Infusions:  . sodium chloride 125 mL/hr at 12/06/12 0646    Assessment: 72 yo F presented to ED with mental status changes, fever, and sepsis.  She takes PTA oral chemotherapeutic agents (immunocompromised) for 3 year hx of multiple myeloma. Patient on  PTA Coumadin for DVT prophylaxis with a home dose of 6mg  daily. Pharmacy consulted to continue dosing inpatient.   Patient was admitted yesterday with a subtherapeutic INR of 1.79 and this morning it has slightly decreased to 1.77.  Given her compromised state and possible sensitivity to dosing she was planned to continue on home dose of 6mg  last night, but no dose charted.  There are no reports of bleeding issues.  Will plan for continuation of home dose tonight and assess INR in AM.   Goal of Therapy:  INR 2-3 Monitor platelets by anticoagulation protocol: Yes   Plan:  - Coumadin 6mg  PO x1 dose tonight - daily INR - monitor for s/s of bleeding  Harrold Donath E. Achilles Dunk, PharmD Clinical Pharmacist - Resident Pager: [phone] Pharmacy: [phone] 12/06/2012 8:39 AM

## 2012-12-06 NOTE — Progress Notes (Signed)
[facility] called to notify that pat had 2 aerobic blood cultures positive for Gram negative rods. Spoke to pharmisist who said that zosyn   Covers for gram negative rods which patient is already on.

## 2012-12-06 NOTE — Progress Notes (Signed)
CRITICAL VALUE ALERT  Critical value received:  K 2.7  Date of notification: 06 sep 14  Time of notification: 2300  Critical value read back:yes  Nurse who received alert:  Corrie Mckusick  MD notified (1st page):M lynch  Time of first page:[phone]  MD notified (2nd page):  Time of second page:  Responding MD: Daphane Shepherd  Time MD responded: 2306

## 2012-12-06 NOTE — Progress Notes (Signed)
eLink Physician-Brief Progress Note Patient Name: Carmen Fisher DOB: 16-Mar-1941 MRN: [id number]  Date of Service  12/06/2012   HPI/Events of Note  Pt continues to be hypotensive after 500cc bolus   eICU Interventions  Will give additional 1LNS, have ordered cortisol level and will give 50mg  hydrocortisone.    Intervention Category Major Interventions: Hypotension - evaluation and management  GIDDINGS, OLIVIA K. 12/06/2012, 12:08 AM

## 2012-12-07 ENCOUNTER — Encounter: Payer: Self-pay | Admitting: Medical Oncology

## 2012-12-07 ENCOUNTER — Other Ambulatory Visit: Payer: Self-pay | Admitting: Medical Oncology

## 2012-12-07 LAB — CORTISOL: Cortisol, Plasma: 26.8 ug/dL

## 2012-12-07 LAB — COMPREHENSIVE METABOLIC PANEL
ALT: 52 U/L — ABNORMAL HIGH (ref 0–35)
AST: 68 U/L — ABNORMAL HIGH (ref 0–37)
Albumin: 2.4 g/dL — ABNORMAL LOW (ref 3.5–5.2)
Alkaline Phosphatase: 68 U/L (ref 39–117)
BUN: 15 mg/dL (ref 6–23)
CO2: 19 mEq/L (ref 19–32)
Calcium: 7.5 mg/dL — ABNORMAL LOW (ref 8.4–10.5)
Chloride: 109 mEq/L (ref 96–112)
Creatinine, Ser: 1.1 mg/dL (ref 0.50–1.10)
GFR calc Af Amer: 58 mL/min — ABNORMAL LOW (ref >90)
GFR calc non Af Amer: 50 mL/min — ABNORMAL LOW (ref >90)
Glucose, Bld: 184 mg/dL — ABNORMAL HIGH (ref 70–99)
Potassium: 3.3 mEq/L — ABNORMAL LOW (ref 3.5–5.1)
Sodium: 139 mEq/L (ref 135–145)
Total Bilirubin: 0.4 mg/dL (ref 0.3–1.2)
Total Protein: 6.2 g/dL (ref 6.0–8.3)

## 2012-12-07 LAB — URINE CULTURE: Colony Count: >=100000

## 2012-12-07 LAB — CBC
HCT: 25.8 % — ABNORMAL LOW (ref 36.0–46.0)
Hemoglobin: 8.6 g/dL — ABNORMAL LOW (ref 12.0–15.0)
MCH: 31.7 pg (ref 26.0–34.0)
MCHC: 33.3 g/dL (ref 30.0–36.0)
MCV: 95.2 fL (ref 78.0–100.0)
Platelets: 108 10*3/uL — ABNORMAL LOW (ref 150–400)
RBC: 2.71 MIL/uL — ABNORMAL LOW (ref 3.87–5.11)
RDW: 16 % — ABNORMAL HIGH (ref 11.5–15.5)
WBC: 10.9 10*3/uL — ABNORMAL HIGH (ref 4.0–10.5)

## 2012-12-07 LAB — PROTIME-INR
INR: 1.77 — ABNORMAL HIGH (ref 0.00–1.49)
Prothrombin Time: 20.1 seconds — ABNORMAL HIGH (ref 11.6–15.2)

## 2012-12-07 LAB — HEMOGLOBIN A1C
Hgb A1c MFr Bld: 6.3 % — ABNORMAL HIGH (ref <5.7)
Mean Plasma Glucose: 134 mg/dL — ABNORMAL HIGH (ref <117)

## 2012-12-07 LAB — PATHOLOGIST SMEAR REVIEW

## 2012-12-07 MED ORDER — DEXTROSE 5 % IV SOLN
1.0000 g | INTRAVENOUS | Status: DC
  Administered 2012-12-07: 1 g via INTRAVENOUS
  Filled 2012-12-07 (×2): qty 10

## 2012-12-07 MED ORDER — LENALIDOMIDE 10 MG PO CAPS: 10.0000 mg | ORAL_CAPSULE | Freq: Every day | ORAL | Status: DC

## 2012-12-07 MED ORDER — LENALIDOMIDE 10 MG PO CAPS
10.0000 mg | ORAL_CAPSULE | Freq: Every day | ORAL | Status: DC
  Administered 2012-12-07: 10 mg via ORAL

## 2012-12-07 MED ORDER — WARFARIN SODIUM 6 MG PO TABS
6.0000 mg | ORAL_TABLET | Freq: Once | ORAL | Status: AC
  Administered 2012-12-07: 6 mg via ORAL
  Filled 2012-12-07: qty 1

## 2012-12-07 MED ORDER — POTASSIUM CHLORIDE CRYS ER 20 MEQ PO TBCR
40.0000 meq | EXTENDED_RELEASE_TABLET | Freq: Once | ORAL | Status: AC
  Administered 2012-12-07: 40 meq via ORAL
  Filled 2012-12-07: qty 2

## 2012-12-07 NOTE — Progress Notes (Signed)
Received a call from pharmacy at [facility] to clarify what chemotherapy agent the pt is taking. I informed her she is taking Revlimid 10 mg daily for 14 days then she rest for 7 days. She states the pt started this new cycle on Sept 4 and 5. They would like to know should they resume at Day 1 or Day 3 of the cycle. Per Dr. Arbutus Ped start on Day 3.

## 2012-12-07 NOTE — Progress Notes (Signed)
Called report to 4n10 family at the bedside pt transferred via wheel chair will continue to monitor

## 2012-12-07 NOTE — Progress Notes (Signed)
Pt brought own chemo drug counted and sent to main pharmacy

## 2012-12-07 NOTE — Progress Notes (Signed)
[facility] Progress Note [place] TEAM 1 - Stepdown/ICU TEAM   Carmen Fisher GNF:[id number] DOB: Jan 15, 1941 DOA: 12/05/2012 PCP: Londell Moh, MD  Admit HPI / Brief Narrative: 72 y.o. female with a three-year history of multiple myeloma currently being treated by oral chemotherapeutic agents who presented to the emergency department with altered mental status and fever. Pt spent the night at her mother's house. Per family, pt was originally acting normal. Pt states she had been having intermittent right side abdominal pain for the past 2 weeks. Pt states she had gotten up this morning to fix a cup of coffee and her mother told her she didn't look well and told her to go back to bed. Later the patient's mother tried to wake pt and she was unresponsive. EMS was activated. EMS reported that pt was responsive to verbal stimulus and would open eyes and wiggle toes, but pt was non-verbal. EMS reports pt very hot to touch. The patient also reports that she has been having diarrhea.   The patient was seen and evaluated in the emergency department. She was found to be febrile with a temperature of 104. She was confused. She was given Tylenol for the fever. She was started on IV fluids. She began to show some improvement after that. She was noted to be slightly hypotensive. Her urinalysis was positive for significant infection. She had an elevated lactic acid level. She was noted to be adequately protecting her airways. A hospital admission was requested for further evaluation and treatment.  Assessment/Plan:  Sepsis due to E coli pyelonephritis with bacteremia Clinically improving - cont empiric abx - f/u cx data for sensitivities - will plan to complete 14 days total of abx tx   Hypotension  Resolved w/ volume expansion and abx   Toxic metabolic encephalopathy Due to above - pt has now returned to her baseline mental status   Acute renal failure  Due to above - improving with volume  expansion - recheck in AM  RLQ Abdominal Pain Likely due to pyelo - CT w/o evidence of abscess but did reveal non-obstructive R renal pelvis stone - denies abdom pain today   Chronic anticoagulation with warfarin Pharmacy to continue  Elevated liver enzymes shock liver - improving with volume - recheck in AM   MM Followed at [facility] - consider resuming Revlimid per her home schedule on 12/08/2012  Code Status: FULL Family Communication: spoke w/ pt and sister at bedside  Disposition Plan: stable for transfer to med bed - begin ambulation/PT/OT  Consultants: none  Procedures: none  Antibiotics: Acyclovir 9/6 >> Zosyn 9/6 >>9/8 Vanc 9/6 >>9/8 Rocephin 9/8 >>  DVT prophylaxis: warfarin  HPI/Subjective: Pt is awake and alert.  She denies any further abdom pain.  She denies sob, cp, n/v, or ha.  Objective: Blood pressure 149/80, pulse 70, temperature 97.9 F (36.6 C), temperature source Oral, resp. rate 18, height 5\' 6"  (1.676 m), weight 77.4 kg (170 lb 10.2 oz), SpO2 96.00%.  Intake/Output Summary (Last 24 hours) at 12/07/12 1513 Last data filed at 12/07/12 1200  Gross per 24 hour  Intake 2312.5 ml  Output    350 ml  Net 1962.5 ml   Exam: General: No acute respiratory distress Lungs: Clear to auscultation bilaterally without wheezes or crackles Cardiovascular: Regular rate and rhythm without murmur gallop or rub Abdomen: Nontender, nondistended, soft, bowel sounds positive, no rebound, no ascites, no appreciable mass Extremities: No significant cyanosis, clubbing, or edema bilateral lower extremities  Data Reviewed: Basic Metabolic Panel:  Recent Labs Lab 12/05/12 1235 12/05/12 2029 12/05/12 2321 12/06/12 0530 12/07/12 0443  NA 139 138  --  138 139  K 2.8* 2.7*  --  4.3 3.3*  CL 101 106  --  109 109  CO2 23 19  --  16* 19  GLUCOSE 146* 90  --  131* 184*  BUN 24* 21  --  18 15  CREATININE 1.69* 1.56*  --  1.39* 1.10  CALCIUM 9.7 7.7*  --   7.5* 7.5*  MG  --   --  1.4* 1.7  --    Liver Function Tests:  Recent Labs Lab 12/05/12 1235 12/05/12 2029 12/06/12 0530 12/07/12 0443  AST 79* 56* 82* 68*  ALT 65* 47* 58* 52*  ALKPHOS 118* 76 78 68  BILITOT 2.6* 1.8* 1.5* 0.4  PROT 7.6 5.7* 6.4 6.2  ALBUMIN 3.5 2.5* 2.7* 2.4*   CBC:  Recent Labs Lab 12/05/12 1235 12/05/12 2029 12/06/12 0530 12/07/12 0443  WBC 1.1* 3.8* 8.3 10.9*  NEUTROABS 0.7*  --  6.3  --   HGB 11.2* 8.7* 8.6* 8.6*  HCT 33.0* 26.0* 25.7* 25.8*  MCV 96.5 95.6 95.5 95.2  PLT 151 118* 126* 108*   Cardiac Enzymes:  Recent Labs Lab 12/05/12 2020 12/06/12 0116 12/06/12 0720  TROPONINI <0.30 <0.30 <0.30   BNP (last 3 results)  Recent Labs  12/05/12 2020  PROBNP 4832.0*    Recent Results (from the past 240 hour(s))  URINE CULTURE     Status: None   Collection Time    12/05/12 12:52 PM      Result Value Range Status   Specimen Description URINE, CATHETERIZED   Final   Special Requests NONE   Final   Culture  Setup Time     Final   Value: 12/05/2012 18:35     Performed at [facility] Count     Final   Value: >=100,000 COLONIES/ML     Performed at [facility]   Culture     Final   Value: ESCHERICHIA COLI     Performed at [facility]   Report Status PENDING   Incomplete  CULTURE, BLOOD (ROUTINE X 2)     Status: None   Collection Time    12/05/12  1:32 PM      Result Value Range Status   Specimen Description BLOOD RIGHT HAND   Final   Special Requests BOTTLES DRAWN AEROBIC ONLY 5 CC   Final   Culture  Setup Time     Final   Value: 12/05/2012 18:24     Performed at [facility]   Culture     Final   Value: ESCHERICHIA COLI     Note: Gram Stain Report Called to,Read Back By and Verified With: Corrie Mckusick RN on 12/06/12 at 03:50 by Christie Nottingham     Performed at [facility]   Report Status PENDING   Incomplete  CULTURE, BLOOD (ROUTINE X 2)     Status: None   Collection Time    12/05/12   1:32 PM      Result Value Range Status   Specimen Description BLOOD LEFT HAND   Final   Special Requests BOTTLES DRAWN AEROBIC ONLY 5 CC   Final   Culture  Setup Time     Final   Value: 12/05/2012 18:24     Performed at [facility]  Final   Value: ESCHERICHIA COLI     Note: Gram Stain Report Called to,Read Back By and Verified With: Corrie Mckusick RN on 12/06/12 at 03:50 by Christie Nottingham     Performed at [facility]   Report Status PENDING   Incomplete  CLOSTRIDIUM DIFFICILE BY PCR     Status: None   Collection Time    12/06/12  4:00 AM      Result Value Range Status   C difficile by pcr NEGATIVE  NEGATIVE Final  MRSA PCR SCREENING     Status: None   Collection Time    12/06/12  5:44 AM      Result Value Range Status   MRSA by PCR NEGATIVE  NEGATIVE Final   Comment:            The GeneXpert MRSA Assay (FDA     approved for NASAL specimens     only), is one component of a     comprehensive MRSA colonization     surveillance program. It is not     intended to diagnose MRSA     infection nor to guide or     monitor treatment for     MRSA infections.     Studies:  Recent x-ray studies have been reviewed in detail by the Attending Physician  Scheduled Meds:  Scheduled Meds: . acyclovir  800 mg Oral BID  . allopurinol  300 mg Oral Daily  . cholecalciferol  1,000 Units Oral Daily  . gabapentin  300 mg Oral BID  . hydrocortisone sod succinate (SOLU-CORTEF) inj  50 mg Intravenous Q8H  . latanoprost  1 drop Both Eyes QHS  . multivitamin with minerals  1 tablet Oral Daily  . pantoprazole  40 mg Oral Daily  . piperacillin-tazobactam (ZOSYN)  IV  3.375 g Intravenous Q8H  . sodium chloride  3 mL Intravenous Q12H  . timolol  1 drop Both Eyes BID  . warfarin  6 mg Oral ONCE-1800  . Warfarin - Pharmacist Dosing Inpatient   Does not apply q1800    Time spent on care of this patient: 35 mins   [facility] T  [facility] Office   [phone] Pager - Text Page per Loretha Stapler as per below:  On-Call/Text Page:      [email or web address]      password [email or web address]  If 7PM-7AM, please contact night-coverage [email or web address] Password [email or web address] 12/07/2012, 3:13 PM   LOS: 2 days

## 2012-12-07 NOTE — Progress Notes (Addendum)
CONSULT NOTE - Follow Up Consult  Pharmacy Consult for Coumadin, Vancomycin, Zosyn Indication: "blood clot prevention" and sepsis  Allergies  Allergen Reactions  . Aspirin Swelling  . Sulfa Antibiotics Swelling    Patient Measurements: Height: 5\' 6"  (167.6 cm) Weight: 170 lb 10.2 oz (77.4 kg) IBW/kg (Calculated) : 59.3 Heparin Dosing Weight:   Vital Signs: Temp: 98.2 F (36.8 C) (09/08 0719) Temp src: Oral (09/08 0719) BP: 142/80 mmHg (09/08 0719) Pulse Rate: 64 (09/08 0719)  Labs:  Recent Labs  12/05/12 1527 12/05/12 2020 12/05/12 2029 12/06/12 0116 12/06/12 0530 12/06/12 0720 12/07/12 0443  HGB  --   --  8.7*  --  8.6*  --  8.6*  HCT  --   --  26.0*  --  25.7*  --  25.8*  PLT  --   --  118*  --  126*  --  108*  LABPROT 20.3*  --   --   --  20.1*  --  20.1*  INR 1.79*  --   --   --  1.77*  --  1.77*  CREATININE  --   --  1.56*  --  1.39*  --  1.10  TROPONINI  --  <0.30  --  <0.30  --  <0.30  --     Estimated Creatinine Clearance: 50 ml/min (by C-G formula based on Cr of 1.1).   Medications:  Scheduled:  . acyclovir  800 mg Oral BID  . allopurinol  300 mg Oral Daily  . cholecalciferol  1,000 Units Oral Daily  . gabapentin  300 mg Oral BID  . hydrocortisone sod succinate (SOLU-CORTEF) inj  50 mg Intravenous Q8H  . latanoprost  1 drop Both Eyes QHS  . multivitamin with minerals  1 tablet Oral Daily  . pantoprazole  40 mg Oral Daily  . piperacillin-tazobactam (ZOSYN)  IV  3.375 g Intravenous Q8H  . sodium chloride  3 mL Intravenous Q12H  . timolol  1 drop Both Eyes BID  . vancomycin  750 mg Intravenous Q12H  . Warfarin - Pharmacist Dosing Inpatient   Does not apply q1800    Assessment: 72yo female with hx multiple myelom admitted with fever and MS changes.  Per d/w pt, she takes Coumadin for "blood clot prevention".  INR this AM is unchanged at 1.77.  No bleeding problems noted.  Pt is on day # 3 of Vancomycin, Zosyn.  Her Tm is much improved today at  100.1.  Blood cultures are (+)GNR and Urine cx is (+)E.Coli.  Per d/w Dr. Sharon Seller, will stop Vancomycin.  Goal of Therapy:  INR 2-3 and resolution of infection Monitor platelets by anticoagulation protocol: Yes   Plan:  1.  Repeat Coumadin 6mg  today 2.  Daily INR 3.  D/C Vancomycin 4.  Continue Zosyn 3.375mg  IV q8, 4hr infusion   Marisue Humble, PharmD Clinical Pharmacist [facility]

## 2012-12-07 NOTE — Progress Notes (Signed)
Utilization review completed.  

## 2012-12-08 LAB — CBC
HCT: 26 % — ABNORMAL LOW (ref 36.0–46.0)
Hemoglobin: 8.8 g/dL — ABNORMAL LOW (ref 12.0–15.0)
MCH: 31.9 pg (ref 26.0–34.0)
MCHC: 33.8 g/dL (ref 30.0–36.0)
MCV: 94.2 fL (ref 78.0–100.0)
Platelets: 122 10*3/uL — ABNORMAL LOW (ref 150–400)
RBC: 2.76 MIL/uL — ABNORMAL LOW (ref 3.87–5.11)
RDW: 15.3 % (ref 11.5–15.5)
WBC: 12.4 10*3/uL — ABNORMAL HIGH (ref 4.0–10.5)

## 2012-12-08 LAB — PROTIME-INR
INR: 1.52 — ABNORMAL HIGH (ref 0.00–1.49)
Prothrombin Time: 17.9 seconds — ABNORMAL HIGH (ref 11.6–15.2)

## 2012-12-08 LAB — COMPREHENSIVE METABOLIC PANEL
ALT: 52 U/L — ABNORMAL HIGH (ref 0–35)
AST: 47 U/L — ABNORMAL HIGH (ref 0–37)
Albumin: 2.5 g/dL — ABNORMAL LOW (ref 3.5–5.2)
Alkaline Phosphatase: 68 U/L (ref 39–117)
BUN: 11 mg/dL (ref 6–23)
CO2: 23 mEq/L (ref 19–32)
Calcium: 7.8 mg/dL — ABNORMAL LOW (ref 8.4–10.5)
Chloride: 109 mEq/L (ref 96–112)
Creatinine, Ser: 0.83 mg/dL (ref 0.50–1.10)
GFR calc Af Amer: 81 mL/min — ABNORMAL LOW (ref >90)
GFR calc non Af Amer: 70 mL/min — ABNORMAL LOW (ref >90)
Glucose, Bld: 89 mg/dL (ref 70–99)
Potassium: 3 mEq/L — ABNORMAL LOW (ref 3.5–5.1)
Sodium: 142 mEq/L (ref 135–145)
Total Bilirubin: 0.4 mg/dL (ref 0.3–1.2)
Total Protein: 6.2 g/dL (ref 6.0–8.3)

## 2012-12-08 LAB — CULTURE, BLOOD (ROUTINE X 2)

## 2012-12-08 MED ORDER — WARFARIN SODIUM 7.5 MG PO TABS
7.5000 mg | ORAL_TABLET | Freq: Once | ORAL | Status: DC
  Filled 2012-12-08: qty 1

## 2012-12-08 MED ORDER — LOPERAMIDE HCL 2 MG PO CAPS: 2.0000 mg | ORAL_CAPSULE | ORAL | Status: DC | PRN

## 2012-12-08 MED ORDER — AMOXICILLIN-POT CLAVULANATE 875-125 MG PO TABS: 1.0000 | ORAL_TABLET | Freq: Two times a day (BID) | ORAL | Status: DC

## 2012-12-08 MED ORDER — LOPERAMIDE HCL 2 MG PO CAPS
2.0000 mg | ORAL_CAPSULE | ORAL | Status: DC | PRN
  Administered 2012-12-08: 2 mg via ORAL
  Filled 2012-12-08: qty 1

## 2012-12-08 NOTE — Evaluation (Signed)
Physical Therapy Evaluation Patient Details Name: Carmen Fisher MRN: [id number] DOB: 03/16/1941 Today's Date: 12/08/2012 Time: 6578-4696 PT Time Calculation (min): 27 min  PT Assessment / Plan / Recommendation History of Present Illness  72 y.o. female with a three-year history of multiple myeloma currently being treated by oral chemotherapeutic agents who presented to the emergency department with altered mental status and fever.  Clinical Impression  Patient presents to PT close to or at her baseline. Balance looks good. Is moving a little slower than baseline likely because of some initial generalized weakness from a few days of immobility. Educated pt on benefits of regular gentle exercise especially being on chemotherapy and to help her rebound from this hospital stay faster.    PT Assessment  Patent does not need any further PT services    Follow Up Recommendations  No PT follow up    Does the patient have the potential to tolerate intense rehabilitation      Barriers to Discharge        Equipment Recommendations  None recommended by PT    Recommendations for Other Services     Frequency      Precautions / Restrictions Precautions Precautions: None Restrictions Weight Bearing Restrictions: No   Pertinent Vitals/Pain Denies pain     Mobility  Bed Mobility Bed Mobility: Not assessed Transfers Transfers: Sit to Stand;Stand to Sit Sit to Stand: 7: Independent Stand to Sit: 7: Independent Ambulation/Gait Ambulation/Gait Assistance: 6: Modified independent (Device/Increase time) Ambulation Distance (Feet): 800 Feet Gait Pattern: Within Functional Limits Gait velocity: slightly slower than what she reports is baseline but able to adjust speed when asked Stairs: Yes Stairs Assistance: 6: Modified independent (Device/Increase time) Stair Management Technique: One rail Right;Alternating pattern Number of Stairs: 5        PT Goals(Current goals can be found in  the care plan section) Acute Rehab PT Goals Patient Stated Goal: home PT Goal Formulation: No goals set, d/c therapy  Visit Information  Last PT Received On: 12/08/12 Assistance Needed: +1 History of Present Illness: 72 y.o. female with a three-year history of multiple myeloma currently being treated by oral chemotherapeutic agents who presented to the emergency department with altered mental status and fever.       Prior Functioning  Home Living Family/patient expects to be discharged to:: Private residence Living Arrangements: Alone Available Help at Discharge: Family;Available PRN/intermittently Type of Home: House Home Access: Level entry Home Layout: One level Home Equipment: None Prior Function Level of Independence: Independent Communication Communication: No difficulties    Cognition  Cognition Arousal/Alertness: Awake/alert Behavior During Therapy: WFL for tasks assessed/performed Overall Cognitive Status: Within Functional Limits for tasks assessed    Extremity/Trunk Assessment Upper Extremity Assessment Upper Extremity Assessment: Overall WFL for tasks assessed Lower Extremity Assessment Lower Extremity Assessment: Overall WFL for tasks assessed   Balance Standardized Balance Assessment Standardized Balance Assessment: Dynamic Gait Index Dynamic Gait Index Level Surface: Normal Change in Gait Speed: Normal Gait with Horizontal Head Turns: Mild Impairment Gait with Vertical Head Turns: Mild Impairment Gait and Pivot Turn: Normal Step Over Obstacle: Normal Step Around Obstacles: Normal Steps: Mild Impairment Total Score: 21  End of Session PT - End of Session Equipment Utilized During Treatment: Gait belt Activity Tolerance: Patient tolerated treatment well Patient left: in chair;with call bell/phone within reach Nurse Communication: Mobility status  GP     Ludger Nutting 12/08/2012, 2:37 PM

## 2012-12-08 NOTE — Progress Notes (Signed)
ANTICOAGULATION CONSULT NOTE - Follow Up Consult  Pharmacy Consult for Coumadin Indication: DVT prophylaxis  Allergies  Allergen Reactions  . Aspirin Swelling  . Sulfa Antibiotics Swelling   Labs:  Recent Labs  12/05/12 2020  12/06/12 0116 12/06/12 0530 12/06/12 0720 12/07/12 0443 12/08/12 0640  HGB  --   < >  --  8.6*  --  8.6* 8.8*  HCT  --   < >  --  25.7*  --  25.8* 26.0*  PLT  --   < >  --  126*  --  108* 122*  LABPROT  --   --   --  20.1*  --  20.1* 17.9*  INR  --   --   --  1.77*  --  1.77* 1.52*  CREATININE  --   < >  --  1.39*  --  1.10 0.83  TROPONINI <0.30  --  <0.30  --  <0.30  --   --   < > = values in this interval not displayed.  Estimated Creatinine Clearance: 66.2 ml/min (by C-G formula based on Cr of 0.83).  Assessment: 72 yo F presented to ED with mental status changes, fever, and sepsis.  She takes PTA oral chemotherapeutic agents (immunocompromised) for 3 year hx of multiple myeloma. Patient on PTA Coumadin for DVT prophylaxis with a home dose of 6mg  daily. Pharmacy consulted to continue dosing inpatient.   INR still sub-therapeutic at 1.52  Now on Rocephin to complete 14 day course of antibiotics for sepsis   Goal of Therapy:  INR 2-3 Monitor platelets by anticoagulation protocol: Yes   Plan:  - Coumadin 7.5 mg PO x1 dose tonight - daily INR - monitor for s/s of bleeding  Thank you. Okey Regal, PharmD [phone]  12/08/2012 8:45 AM

## 2012-12-08 NOTE — Discharge Summary (Signed)
Physician Discharge Summary  Carmen Fisher:[id number] DOB: Aug 14, 1940 DOA: 12/05/2012  PCP: Londell Moh, MD  Admit date: 12/05/2012 Discharge date: 12/08/2012  Time spent: 40 minutes  Recommendations for Outpatient Follow-up:  1. Cbc in 1 week + INR 2. Complete Augmentin bid for 12/18/12 3. Needs discussion about duration of anticoagulation with PCP  Discharge Diagnoses:  Active Problems:   UTI (lower urinary tract infection)   Hypotension   Altered mental status   Dehydration   Leukopenia   ARF (acute renal failure)   Elevated liver enzymes   Sepsis   Neutropenic fever   Discharge Condition: good  Diet recommendation: low vit K  Filed Weights   12/05/12 1855  Weight: 77.4 kg (170 lb 10.2 oz)    History of present illness:  72 y.o. female with a three-year history of multiple myeloma currently being treated by oral chemotherapeutic agents who presented to the emergency department with altered mental status and fever. Pt spent the night at her mother's house. Per family, pt was originally acting normal. Pt states she had been having intermittent right side abdominal pain for the past 2 weeks. Patient became unresponsive-EMS was activated. EMS reported that pt was responsive to verbal stimulus and would open eyes and wiggle toes, but pt was non-verbal. EMS reports pt very hot to touch. The patient also reports that she has been having diarrhea.    Hospital Course:  Sepsis due to E coli pyelonephritis with bacteremia  Clinically improving - cont empiric abx - transitioned to Augmentin to complete 14 days total of abx tx 12/18/12 Hypotension  Resolved w/ volume expansion and abx  Toxic metabolic encephalopathy  Due to above - pt has now returned to her baseline mental status  Acute renal failure  Due to above - improved with volume expansion  RLQ Abdominal Pain  Likely due to pyelo - CT w/o evidence of abscess but did reveal non-obstructive R renal pelvis stone   Chronic anticoagulation with warfarin for LE dvt in past Pharmacy to continue-needs this a least 6 mo for Rx-needs discussion with Dr. [name] as OP Elevated liver enzymes  shock liver - improved MM  Followed at [facility] - consider resuming Revlimid per her home schedule on 12/08/2012 Diarrhea-possibly 2/2 to Revlimid.  She states this started when she started taking this..  As source known and Cdiff neg, started prn loperamide on d/c   Consultants:  none  Procedures:  none  Antibiotics:  Acyclovir 9/6 >>  Zosyn 9/6 >>9/8  Vanc 9/6 >>9/8  Rocephin 9/8 >>9/9 Augmentin 9/9 till 9/29   Discharge Exam: Filed Vitals:   12/08/12 0538  BP: 149/72  Pulse: 58  Temp: 98.1 F (36.7 C)  Resp: 20   Alert pleasant oriented, NAD  General: looks well.  Just ambulated about 40 feet without issue Cardiovascular:  s1 s2 no m/r/g Respiratory: clear  Discharge Instructions  Discharge Orders   Future Orders Complete By Expires   Diet - low sodium heart healthy  As directed    Discharge instructions  As directed    Comments:     You were cared for by a hospitalist during your hospital stay. If you have any questions about your discharge medications or the care you received while you were in the hospital after you are discharged, you can call the unit and asked to speak with the hospitalist on call if the hospitalist that took care of you is not available. Once you are discharged, your primary  care physician will handle any further medical issues. Please note that NO REFILLS for any discharge medications will be authorized once you are discharged, as it is imperative that you return to your primary care physician (or establish a relationship with a primary care physician if you do not have one) for your aftercare needs so that they can reassess your need for medications and monitor your lab values. If you do not have a primary care physician, you can call [phone] for a physician  referral.   Increase activity slowly  As directed        Medication List    STOP taking these medications       pantoprazole 40 MG tablet  Commonly known as:  PROTONIX      TAKE these medications       acyclovir 800 MG tablet  Commonly known as:  ZOVIRAX  Take 800 mg by mouth 2 (two) times daily.     allopurinol 300 MG tablet  Commonly known as:  ZYLOPRIM  Take 300 mg by mouth daily.     amoxicillin-clavulanate 875-125 MG per tablet  Commonly known as:  AUGMENTIN  Take 1 tablet by mouth 2 (two) times daily.     cholecalciferol 1000 UNITS tablet  Commonly known as:  VITAMIN D  Take 1,000 Units by mouth daily.     gabapentin 300 MG capsule  Commonly known as:  NEURONTIN  Take 600 mg by mouth 2 (two) times daily.     latanoprost 0.005 % ophthalmic solution  Commonly known as:  XALATAN  Place 1 drop into both eyes at bedtime.     loperamide 2 MG capsule  Commonly known as:  IMODIUM  Take 1 capsule (2 mg total) by mouth as needed for diarrhea or loose stools.     multivitamin with minerals Tabs tablet  Take 1 tablet by mouth daily.     potassium chloride SA 20 MEQ tablet  Commonly known as:  K-DUR,KLOR-CON  Take 20 mEq by mouth 2 (two) times daily.     REVLIMID 10 MG capsule  Generic drug:  lenalidomide  Take 10 mg by mouth daily. Pt takes 1 capsule daily for 14 days in a row then off for 7 days in a row and then repeats     timolol 0.5 % ophthalmic solution  Commonly known as:  BETIMOL  Place 1 drop into both eyes 2 (two) times daily.     warfarin 6 MG tablet  Commonly known as:  COUMADIN  Take 6 mg by mouth daily.     ZOMETA 4 MG/5ML injection  Generic drug:  zolendronic acid  Inject 4 mg into the vein every 3 (three) months.       Allergies  Allergen Reactions  . Aspirin Swelling  . Sulfa Antibiotics Swelling      The results of significant diagnostics from this hospitalization (including imaging, microbiology, ancillary and laboratory) are  listed below for reference.    Significant Diagnostic Studies: Ct Abdomen Pelvis W Contrast  12/05/2012   *RADIOLOGY REPORT*  Clinical Data: Right lower quadrant abdominal pain for 2 weeks.  CT ABDOMEN AND PELVIS WITH CONTRAST  Technique:  Multidetector CT imaging of the abdomen and pelvis was performed following the standard protocol during bolus administration of intravenous contrast.  Contrast: 80mL OMNIPAQUE IOHEXOL 300 MG/ML  SOLN  Comparison: None.  Findings: Ground-glass changes in the lung bases are probably due to respiratory motion artifact.  Circumscribed low attenuation lesion in the anterior  segment right lobe of the liver measuring 2.2 cm diameter.  This likely represents a cyst.  The gallbladder, spleen, pancreas, adrenal glands, inferior vena cava, and retroperitoneal lymph nodes are unremarkable.  There is a large stones centrally in the kidney measuring 9 mm diameter.  No evidence of hydronephrosis.  No ureteral stone or ureteral dilatation identified.  Calcification of the aorta without aneurysm.  Stomach, small bowel, and colon are unremarkable.  Contrast material flows to the rectum without evidence of obstruction.  There is focal narrowing at the upper rectum which is likely due to peristalsis but  rectal mass or stricture is not excluded no free air or free fluid in the abdomen. Abdominal wall musculature appears intact.  Pelvis:  Foley catheter decompresses the bladder.  Uterus is surgically absent or atrophic.  No abnormal adnexal masses.  No significant pelvic lymphadenopathy.  No free or loculated pelvic fluid collections.  The appendix is not identified.  No evidence of diverticulitis.  Degenerative changes in the lumbar spine and hips. No destructive bone lesions are appreciated.  IMPRESSION: Stone in the right renal pelvis measuring 9 mm.  No obstruction. Focal narrowing of the rectum probably representing peristalsis but stricture or mass lesion not excluded.  No acute inflammatory  process demonstrated in the abdomen or pelvis.   Original Report Authenticated By: Burman Nieves, [name]   Portable Chest 1 View  12/06/2012   *RADIOLOGY REPORT*  Clinical Data: Sepsis.  PORTABLE CHEST - 1 VIEW  Comparison: Chest x-ray 12/05/2012.  Findings: Lung volumes are low.  Bibasilar opacities favored to reflect areas of subsegmental atelectasis. No definite consolidative airspace disease.  No pleural effusions.  No evidence of pulmonary edema.  Heart size is within normal limits. Mediastinal contours are slightly distorted by patient rotation to the right.  IMPRESSION: 1.  Low lung volumes with bibasilar subsegmental atelectasis.   Original Report Authenticated By: Trudie Reed, [name]   Dg Chest Port 1 View  12/05/2012   *RADIOLOGY REPORT*  Clinical Data: Chest pain.  PORTABLE CHEST - 1 VIEW  Comparison: No priors.  Findings: Lung volumes are low.  There are extensive bibasilar opacities (left greater than right), which may simply reflect atelectasis, however, underlying air space consolidation is difficult to exclude.  Probable small left pleural effusion. Crowding of the pulmonary vasculature, accentuated by low lung volumes, without frank pulmonary edema.  Heart size appears normal. The patient is rotated to the left on today's exam, resulting in distortion of the mediastinal contours and reduced diagnostic sensitivity and specificity for mediastinal pathology.  IMPRESSION: 1.  Low lung volumes with probable bibasilar subsegmental atelectasis. 2.  Underlying air space consolidation from infection or aspiration at the left base is not excluded. 3.  Probable small left pleural effusion.   Original Report Authenticated By: Trudie Reed, [name]    Microbiology: Recent Results (from the past 240 hour(s))  URINE CULTURE     Status: None   Collection Time    12/05/12 12:52 PM      Result Value Range Status   Specimen Description URINE, CATHETERIZED   Final   Special Requests NONE   Final    Culture  Setup Time     Final   Value: 12/05/2012 18:35     Performed at [facility] Count     Final   Value: >=100,000 COLONIES/ML     Performed at [facility]   Culture     Final   Value: ESCHERICHIA COLI  Performed at [facility]   Report Status 12/07/2012 FINAL   Final   Organism ID, Bacteria ESCHERICHIA COLI   Final  CULTURE, BLOOD (ROUTINE X 2)     Status: None   Collection Time    12/05/12  1:32 PM      Result Value Range Status   Specimen Description BLOOD RIGHT HAND   Final   Special Requests BOTTLES DRAWN AEROBIC ONLY 5 CC   Final   Culture  Setup Time     Final   Value: 12/05/2012 18:24     Performed at [facility]   Culture     Final   Value: ESCHERICHIA COLI     Note: Gram Stain Report Called to,Read Back By and Verified With: Corrie Mckusick RN on 12/06/12 at 03:50 by Christie Nottingham     Performed at [facility]   Report Status 12/08/2012 FINAL   Final   Organism ID, Bacteria ESCHERICHIA COLI   Final  CULTURE, BLOOD (ROUTINE X 2)     Status: None   Collection Time    12/05/12  1:32 PM      Result Value Range Status   Specimen Description BLOOD LEFT HAND   Final   Special Requests BOTTLES DRAWN AEROBIC ONLY 5 CC   Final   Culture  Setup Time     Final   Value: 12/05/2012 18:24     Performed at [facility]   Culture     Final   Value: ESCHERICHIA COLI     Note: SUSCEPTIBILITIES PERFORMED ON PREVIOUS CULTURE WITHIN THE LAST 5 DAYS.     Note: Gram Stain Report Called to,Read Back By and Verified With: Corrie Mckusick RN on 12/06/12 at 03:50 by Christie Nottingham     Performed at [facility]   Report Status 12/08/2012 FINAL   Final  CLOSTRIDIUM DIFFICILE BY PCR     Status: None   Collection Time    12/06/12  4:00 AM      Result Value Range Status   C difficile by pcr NEGATIVE  NEGATIVE Final  MRSA PCR SCREENING     Status: None   Collection Time    12/06/12  5:44 AM      Result Value Range Status   MRSA by PCR  NEGATIVE  NEGATIVE Final   Comment:            The GeneXpert MRSA Assay (FDA     approved for NASAL specimens     only), is one component of a     comprehensive MRSA colonization     surveillance program. It is not     intended to diagnose MRSA     infection nor to guide or     monitor treatment for     MRSA infections.     Labs: Basic Metabolic Panel:  Recent Labs Lab 12/05/12 1235 12/05/12 2029 12/05/12 2321 12/06/12 0530 12/07/12 0443 12/08/12 0640  NA 139 138  --  138 139 142  K 2.8* 2.7*  --  4.3 3.3* 3.0*  CL 101 106  --  109 109 109  CO2 23 19  --  16* 19 23  GLUCOSE 146* 90  --  131* 184* 89  BUN 24* 21  --  18 15 11   CREATININE 1.69* 1.56*  --  1.39* 1.10 0.83  CALCIUM 9.7 7.7*  --  7.5* 7.5* 7.8*  MG  --   --  1.4* 1.7  --   --  Liver Function Tests:  Recent Labs Lab 12/05/12 1235 12/05/12 2029 12/06/12 0530 12/07/12 0443 12/08/12 0640  AST 79* 56* 82* 68* 47*  ALT 65* 47* 58* 52* 52*  ALKPHOS 118* 76 78 68 68  BILITOT 2.6* 1.8* 1.5* 0.4 0.4  PROT 7.6 5.7* 6.4 6.2 6.2  ALBUMIN 3.5 2.5* 2.7* 2.4* 2.5*   No results found for this basename: LIPASE, AMYLASE,  in the last 168 hours No results found for this basename: AMMONIA,  in the last 168 hours CBC:  Recent Labs Lab 12/05/12 1235 12/05/12 2029 12/06/12 0530 12/07/12 0443 12/08/12 0640  WBC 1.1* 3.8* 8.3 10.9* 12.4*  NEUTROABS 0.7*  --  6.3  --   --   HGB 11.2* 8.7* 8.6* 8.6* 8.8*  HCT 33.0* 26.0* 25.7* 25.8* 26.0*  MCV 96.5 95.6 95.5 95.2 94.2  PLT 151 118* 126* 108* 122*   Cardiac Enzymes:  Recent Labs Lab 12/05/12 2020 12/06/12 0116 12/06/12 0720  TROPONINI <0.30 <0.30 <0.30   BNP: BNP (last 3 results)  Recent Labs  12/05/12 2020  PROBNP 4832.0*   CBG: No results found for this basename: GLUCAP,  in the last 168 hours     Signed:  Rhetta Mura  [facility] 12/08/2012, 12:41 PM   \

## 2012-12-16 ENCOUNTER — Other Ambulatory Visit: Payer: Self-pay

## 2012-12-16 DIAGNOSIS — C9000 Multiple myeloma not having achieved remission: Secondary | ICD-10-CM

## 2012-12-16 MED ORDER — LENALIDOMIDE 10 MG PO CAPS: ORAL_CAPSULE | ORAL | Status: DC

## 2012-12-16 NOTE — Telephone Encounter (Signed)
Fax received from [facility] that per insurance plan, they are not allowed to fill Revlimid 10 mg script until 12/18/12. It will be processed at that time.

## 2012-12-21 NOTE — Telephone Encounter (Signed)
RECEIVED FAX FROM [facility] CONCERNING A CONFIRMATION OF PT.'S PRESCRIPTION BENEFITS. PT.'S COPAY IS ZERO.

## 2012-12-22 ENCOUNTER — Other Ambulatory Visit (HOSPITAL_BASED_OUTPATIENT_CLINIC_OR_DEPARTMENT_OTHER): Payer: Medicare Other

## 2012-12-22 DIAGNOSIS — Z7901 Long term (current) use of anticoagulants: Secondary | ICD-10-CM

## 2012-12-22 DIAGNOSIS — C9000 Multiple myeloma not having achieved remission: Secondary | ICD-10-CM

## 2012-12-22 LAB — CBC WITH DIFFERENTIAL/PLATELET
BASO%: 0.7 % (ref 0.0–2.0)
Basophils Absolute: 0 10*3/uL (ref 0.0–0.1)
EOS%: 1.1 % (ref 0.0–7.0)
Eosinophils Absolute: 0.1 10*3/uL (ref 0.0–0.5)
HCT: 34.4 % — ABNORMAL LOW (ref 34.8–46.6)
HGB: 10.9 g/dL — ABNORMAL LOW (ref 11.6–15.9)
LYMPH%: 50.1 % — ABNORMAL HIGH (ref 14.0–49.7)
MCH: 31.3 pg (ref 25.1–34.0)
MCHC: 31.7 g/dL (ref 31.5–36.0)
MCV: 98.9 fL (ref 79.5–101.0)
MONO#: 0.5 10*3/uL (ref 0.1–0.9)
MONO%: 12.1 % (ref 0.0–14.0)
NEUT#: 1.6 10*3/uL (ref 1.5–6.5)
NEUT%: 36 % — ABNORMAL LOW (ref 38.4–76.8)
Platelets: 273 10*3/uL (ref 145–400)
RBC: 3.48 10*6/uL — ABNORMAL LOW (ref 3.70–5.45)
RDW: 16 % — ABNORMAL HIGH (ref 11.2–14.5)
WBC: 4.4 10*3/uL (ref 3.9–10.3)
lymph#: 2.2 10*3/uL (ref 0.9–3.3)
nRBC: 0 % (ref 0–0)

## 2012-12-22 LAB — PROTIME-INR
INR: 1.6 — ABNORMAL LOW (ref 2.00–3.50)
Protime: 19.2 Seconds — ABNORMAL HIGH (ref 10.6–13.4)

## 2012-12-25 ENCOUNTER — Encounter: Payer: Self-pay | Admitting: *Deleted

## 2012-12-25 NOTE — Progress Notes (Signed)
RECEIVED A FAX FROM [facility] CONCERNING A CONFIRMATION OF PRESCRIPTION SHIPMENT FOR REVLIMID ON 12/24/12.

## 2013-01-04 ENCOUNTER — Other Ambulatory Visit: Payer: Self-pay | Admitting: *Deleted

## 2013-01-04 ENCOUNTER — Other Ambulatory Visit: Payer: Self-pay | Admitting: Oncology

## 2013-01-04 DIAGNOSIS — C9000 Multiple myeloma not having achieved remission: Secondary | ICD-10-CM

## 2013-01-04 NOTE — Telephone Encounter (Signed)
Confirmed bid dosing with patient and she is having 2-4 loose stools/day and not taking her Imodium. Suggested she take 1-2 Imodium/day to decrease diarrhea to help keep her K+ level normal. She agrees to try this. Will make MD aware to confirm bid dosing is still desired before refill is done. Next appointment is 01/20/13.

## 2013-01-12 ENCOUNTER — Ambulatory Visit (HOSPITAL_BASED_OUTPATIENT_CLINIC_OR_DEPARTMENT_OTHER): Payer: Medicare Other | Admitting: Lab

## 2013-01-12 ENCOUNTER — Telehealth: Payer: Self-pay | Admitting: Internal Medicine

## 2013-01-12 ENCOUNTER — Other Ambulatory Visit: Payer: Self-pay | Admitting: Medical Oncology

## 2013-01-12 ENCOUNTER — Other Ambulatory Visit: Payer: Medicare Other

## 2013-01-12 DIAGNOSIS — C9000 Multiple myeloma not having achieved remission: Secondary | ICD-10-CM

## 2013-01-12 LAB — COMPREHENSIVE METABOLIC PANEL (CC13)
ALT: 28 U/L (ref 0–55)
AST: 21 U/L (ref 5–34)
Albumin: 3.4 g/dL — ABNORMAL LOW (ref 3.5–5.0)
Alkaline Phosphatase: 86 U/L (ref 40–150)
Anion Gap: 7 mEq/L (ref 3–11)
BUN: 14.5 mg/dL (ref 7.0–26.0)
CO2: 25 mEq/L (ref 22–29)
Calcium: 9.2 mg/dL (ref 8.4–10.4)
Chloride: 109 mEq/L (ref 98–109)
Creatinine: 0.9 mg/dL (ref 0.6–1.1)
Glucose: 96 mg/dl (ref 70–140)
Potassium: 4 mEq/L (ref 3.5–5.1)
Sodium: 141 mEq/L (ref 136–145)
Total Bilirubin: 0.53 mg/dL (ref 0.20–1.20)
Total Protein: 7.5 g/dL (ref 6.4–8.3)

## 2013-01-12 LAB — CBC WITH DIFFERENTIAL/PLATELET
BASO%: 0.8 % (ref 0.0–2.0)
Basophils Absolute: 0.1 10*3/uL (ref 0.0–0.1)
EOS%: 0.9 % (ref 0.0–7.0)
Eosinophils Absolute: 0.1 10*3/uL (ref 0.0–0.5)
HCT: 33.7 % — ABNORMAL LOW (ref 34.8–46.6)
HGB: 11 g/dL — ABNORMAL LOW (ref 11.6–15.9)
LYMPH%: 31.2 % (ref 14.0–49.7)
MCH: 32.7 pg (ref 25.1–34.0)
MCHC: 32.6 g/dL (ref 31.5–36.0)
MCV: 100.2 fL (ref 79.5–101.0)
MONO#: 0.9 10*3/uL (ref 0.1–0.9)
MONO%: 12.8 % (ref 0.0–14.0)
NEUT#: 3.6 10*3/uL (ref 1.5–6.5)
NEUT%: 54.3 % (ref 38.4–76.8)
Platelets: 182 10*3/uL (ref 145–400)
RBC: 3.36 10*6/uL — ABNORMAL LOW (ref 3.70–5.45)
RDW: 16 % — ABNORMAL HIGH (ref 11.2–14.5)
WBC: 6.7 10*3/uL (ref 3.9–10.3)
lymph#: 2.1 10*3/uL (ref 0.9–3.3)

## 2013-01-12 LAB — LACTATE DEHYDROGENASE (CC13): LDH: 186 U/L (ref 125–245)

## 2013-01-12 LAB — PROTEIN / CREATININE RATIO, URINE
Creatinine, Urine: 50.6 mg/dL
Protein Creatinine Ratio: 0.26 — ABNORMAL HIGH (ref <0.15)
Total Protein, Urine: 13 mg/dL

## 2013-01-12 NOTE — Telephone Encounter (Signed)
Pt came by , labs for 10/14 cancelled, Felicita Gage RN wants labs for next week be drawn today

## 2013-01-13 LAB — KAPPA/LAMBDA LIGHT CHAINS
Kappa free light chain: 5.53 mg/dL — ABNORMAL HIGH (ref 0.33–1.94)
Kappa:Lambda Ratio: 1.78 — ABNORMAL HIGH (ref 0.26–1.65)
Lambda Free Lght Chn: 3.11 mg/dL — ABNORMAL HIGH (ref 0.57–2.63)

## 2013-01-13 LAB — IGG, IGA, IGM
IgA: 283 mg/dL (ref 69–380)
IgG (Immunoglobin G), Serum: 1330 mg/dL (ref 690–1700)
IgM, Serum: 41 mg/dL — ABNORMAL LOW (ref 52–322)

## 2013-01-14 ENCOUNTER — Other Ambulatory Visit: Payer: Self-pay | Admitting: Medical Oncology

## 2013-01-14 DIAGNOSIS — C9000 Multiple myeloma not having achieved remission: Secondary | ICD-10-CM

## 2013-01-14 LAB — SPEP & IFE WITH QIG
Albumin ELP: 51.1 % — ABNORMAL LOW (ref 55.8–66.1)
Alpha-1-Globulin: 7.4 % — ABNORMAL HIGH (ref 2.9–4.9)
Alpha-2-Globulin: 11.5 % (ref 7.1–11.8)
Beta 2: 5 % (ref 3.2–6.5)
Beta Globulin: 6.5 % (ref 4.7–7.2)
Gamma Globulin: 18.5 % (ref 11.1–18.8)
IgA: 268 mg/dL (ref 69–380)
IgG (Immunoglobin G), Serum: 1320 mg/dL (ref 690–1700)
IgM, Serum: 44 mg/dL — ABNORMAL LOW (ref 52–322)
Total Protein, Serum Electrophoresis: 6.9 g/dL (ref 6.0–8.3)

## 2013-01-14 MED ORDER — LENALIDOMIDE 10 MG PO CAPS: ORAL_CAPSULE | ORAL | Status: DC

## 2013-01-14 NOTE — Telephone Encounter (Signed)
RECEIVED A FAX FROM [facility] CONCERNING A CONFIRMATION OF PT.'S PRESCRIPTION BENEFITS. PT.'S COPAY IS ZERO. 

## 2013-01-20 ENCOUNTER — Ambulatory Visit (HOSPITAL_BASED_OUTPATIENT_CLINIC_OR_DEPARTMENT_OTHER): Payer: Medicare Other

## 2013-01-20 ENCOUNTER — Telehealth: Payer: Self-pay | Admitting: *Deleted

## 2013-01-20 ENCOUNTER — Other Ambulatory Visit: Payer: Self-pay | Admitting: Internal Medicine

## 2013-01-20 ENCOUNTER — Ambulatory Visit (HOSPITAL_BASED_OUTPATIENT_CLINIC_OR_DEPARTMENT_OTHER): Payer: Medicare Other | Admitting: Internal Medicine

## 2013-01-20 ENCOUNTER — Telehealth: Payer: Self-pay | Admitting: Internal Medicine

## 2013-01-20 ENCOUNTER — Other Ambulatory Visit: Payer: Medicare Other | Admitting: Lab

## 2013-01-20 VITALS — BP 154/72 | HR 74 | Temp 98.0°F | Resp 20 | Ht 66.0 in | Wt 169.4 lb

## 2013-01-20 DIAGNOSIS — C9000 Multiple myeloma not having achieved remission: Secondary | ICD-10-CM

## 2013-01-20 DIAGNOSIS — G609 Hereditary and idiopathic neuropathy, unspecified: Secondary | ICD-10-CM

## 2013-01-20 DIAGNOSIS — D649 Anemia, unspecified: Secondary | ICD-10-CM

## 2013-01-20 MED ORDER — ZOLEDRONIC ACID 4 MG/100ML IV SOLN
4.0000 mg | Freq: Once | INTRAVENOUS | Status: AC
  Administered 2013-01-20: 4 mg via INTRAVENOUS
  Filled 2013-01-20: qty 100

## 2013-01-20 MED ORDER — SODIUM CHLORIDE 0.9 % IV SOLN
INTRAVENOUS | Status: DC
  Administered 2013-01-20: 10:00:00 via INTRAVENOUS

## 2013-01-20 MED ORDER — HEPARIN SOD (PORK) LOCK FLUSH 100 UNIT/ML IV SOLN
250.0000 [IU] | Freq: Once | INTRAVENOUS | Status: DC | PRN
  Filled 2013-01-20: qty 5

## 2013-01-20 NOTE — Progress Notes (Signed)
[facility] OFFICE PROGRESS NOTE  Londell Moh, MD [address] [place] [address]  DIAGNOSIS: Multiple myeloma  Chief Complaint  Patient presents with  . Multiple Myeloma    CURRENT THERAPY:-Revlimid 10 mg daily, 2 weeks on, 1 week off. Revlimid was started on 09/24/2010.  -Zometa 4 mg IV currently being given every 3 months. This was started on 09/28/2010 and initially was given monthly.  -Coumadin 6 mg daily to maintain an INR of approximately 1.5. This is being followed by the [facility].   IMMUNIZATIONS:  Pneumovax was given in July 2012.  Flu shot was given on February 12, 2011.  Inoculations following high-dose therapy and autologous stem cell infusion were given on May 05, 2011.  INTERVAL HISTORY: LLUVIA Fisher 72 y.o. female with a history of Multiple myeloma is here for follow-up. She was last seen by Dr. Karel Jarvis on 10/20/2012.  Clinically, she continues to do well with no change in her condition.  She denies any side effects from the IV Zometa that she receives every 3 months.  She is on Revlimid 10 mg daily by mouth, 2 weeks out of every 3 weeks.  She is also followed by [facility] about once a year.  She continues to have grade 1 neuropathy symptoms but otherwise is without problems.  She was admitted to [facility] for AMS and fever secondary to complicated UTI sepsis due to E.Coli pyelonephritis and bacteremia.  She completed a course of antibiotics and reports complete resolution.  She denies nausea/vomiting, abdominal, fevers/chills or acute shortness of breath.  She denies hematuria or dysuria. She also denies bone pain or night sweats or weight loss.   MEDICAL HISTORY: Past Medical History  Diagnosis Date  . Multiple myeloma, without mention of having achieved remission(203.00)   . Anemia, unspecified   . Hypertension   . Peripheral neuropathy   . Dyslipidemia   . Glaucoma   . Multiple myeloma   . Chronic  anticoagulation   . Glaucoma   . Gout   . Osteoporosis   . Hypokalemia   . GERD (gastroesophageal reflux disease)     INTERIM HISTORY: has Multiple myeloma; Peripheral neuropathy, secondary to drugs or chemicals; Multiple myeloma; Multiple myeloma; Chronic anticoagulation; UTI (lower urinary tract infection); Hypotension; Altered mental status; Dehydration; Leukopenia; ARF (acute renal failure); Elevated liver enzymes; Sepsis; and Neutropenic fever on her problem list.    ALLERGIES:  is allergic to aspirin; aspirin; sulfa antibiotics; and sulfa antibiotics.  MEDICATIONS: has a current medication list which includes the following prescription(s): acetaminophen, allopurinol, cholecalciferol, gabapentin, latanoprost, lenalidomide, loperamide, lorazepam, multivitamin with minerals, oxycodone, pantoprazole, potassium chloride sa, prochlorperazine, timolol, warfarin, zolendronic acid, and magic mouthwash.  SURGICAL HISTORY:  Past Surgical History  Procedure Laterality Date  . Appendectomy    . Abdominal hysterectomy  1994    w/BSO   PROBLEM LIST:  1. Multiple myeloma diagnosed in August 2010. She has kappa light chain disease. In addition, she was found to have monoclonal IgG as well in her urine back at the time of diagnosis. Bone marrow initially showed 50% plasma cells. Metastatic bone survey carried out on March 30, 2009 was negative. Carmen Fisher was initially treated with Velcade, Decadron and Doxil for 4-1/2 cycles from 04/24/2009 through 07/27/2009. She developed peripheral neuropathy. Another bone marrow was carried out on 08/03/2009,  showed 30% plasma cells. Revlimid and Decadron were given from 09/01/2009 through December 2011. Bone marrow on March 01, 2010 showed 5% plasma  cells. 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 She has had an excellent response to that treatment. Since June 2012,  she has been receiving maintenance  Revlimid and Zometa. Serum immunofixation electrophoresis from 10/26/2010 was negative. Urine immunofixation electrophoresis from July 2012 continues to show monoclonal free kappa light chains. Total urine protein was 125 mg at that time. Back at the time of diagnosis in December 2010, the 24-hour urine protein was over 8 g. The patient's most recent metastatic bone survey carried out at [facility] on 02/07/2011 was Negative.  A 24 hour urine collection from 02/06/2012 yielded a protein of 704 mg as compared with 218 mg on 07/30/2011, 125 mg on 04/19/2011 and 62 mg on 07/10/2010. The free kappa light chain excretion was 123.5 mg suggesting that most of the protein is not the kappa light chain. The urine kappa to lambda ratio was 31.88 and the urine immunofixation electrophoresis was positive for monoclonal kappa light  chains. Urine protein to creatinine ratio was 0.04. Serum  immunofixation electrophoresis from 02/06/2012 showed no monoclonal protein. IgG level was 1530, IgA level 187 and IgM level 32, the latter being low. Metastatic bone survey from 02/06/2012 showed no evidencefor metastatic disease.  A 24-hour urine collection from 04/30/2012 yielded 342 mg of protein, of which 304 mg were monoclonal kappa light chains. Urine immunofixation electrophoresis was positive for monoclonal free kappa light chains.  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 Symptoms developed in April 2011. The patient is currently on Neurontin.  3. Hypertension.  4. GERD.  5. Dyslipidemia.  6. Glaucoma. 7. Admitted to ICU with E.coli bacteremia secondary to pyelonephritis (11/2012).  REVIEW OF SYSTEMS:   Constitutional: Denies fevers, chills or abnormal weight loss Eyes: Denies blurriness of vision Ears, nose, mouth, throat, and face: Denies mucositis or sore throat Respiratory: Denies cough, dyspnea or wheezes Cardiovascular: Denies palpitation, chest discomfort or lower extremity swelling Gastrointestinal:   Denies nausea, heartburn or change in bowel habits Skin: Denies abnormal skin rashes Lymphatics: Denies new lymphadenopathy or easy bruising Neurological:Denies numbness, tingling or new weaknesses Behavioral/Psych: Mood is stable, no new changes  All other systems were reviewed with the patient and are negative.  PHYSICAL EXAMINATION: ECOG PERFORMANCE STATUS: 0 - Asymptomatic  Blood pressure 154/72, pulse 74, temperature 98 F (36.7 C), temperature source Oral, resp. rate 20, height 5\' 6"  (1.676 m), weight 169 lb 6.4 oz (76.839 kg).  GENERAL:alert, no distress and comfortable SKIN: skin color, texture, turgor are normal, no rashes or significant lesions EYES: normal, Conjunctiva are pink and non-injected, sclera clear OROPHARYNX:no exudate, no erythema and lips, buccal mucosa, and tongue normal  NECK: supple, thyroid normal size, non-tender, without nodularity LYMPH:  no palpable lymphadenopathy in the cervical, axillary or supraclavicular LUNGS: clear to auscultation and percussion with normal breathing effort HEART: regular rate & rhythm and SEM and no lower extremity edema ABDOMEN:abdomen soft, non-tender and normal bowel sounds Musculoskeletal:no cyanosis of digits and no clubbing  NEURO: alert & oriented x 3 with fluent speech, no focal motor/sensory deficits  Labs:  Lab Results  Component Value Date   WBC 6.7 01/12/2013   HGB 11.0* 01/12/2013   HCT 33.7* 01/12/2013   MCV 100.2 01/12/2013   PLT 182 01/12/2013   NEUTROABS 3.6 01/12/2013      Chemistry      Component Value Date/Time   NA 141 01/12/2013 1122   NA 142 12/08/2012 0640   K 4.0 01/12/2013 1122   K 3.0*  12/08/2012 0640   CL 109 12/08/2012 0640   CL 106 07/28/2012 0924   CO2 25 01/12/2013 1122   CO2 23 12/08/2012 0640   BUN 14.5 01/12/2013 1122   BUN 11 12/08/2012 0640   CREATININE 0.9 01/12/2013 1122   CREATININE 0.83 12/08/2012 0640   CREATININE 0.93 10/01/2010 0836      Component Value Date/Time   CALCIUM 9.2  01/12/2013 1122   CALCIUM 7.8* 12/08/2012 0640   ALKPHOS 86 01/12/2013 1122   ALKPHOS 68 12/08/2012 0640   AST 21 01/12/2013 1122   AST 47* 12/08/2012 0640   ALT 28 01/12/2013 1122   ALT 52* 12/08/2012 0640   BILITOT 0.53 01/12/2013 1122   BILITOT 0.4 12/08/2012 0640     GFR Estimated Creatinine Clearance: 60.9 ml/min (by C-G formula based on Cr of 0.9).  Results for JILIAN, WEST (MRN [id number]) as of 01/21/2013 03:18  Ref. Range 01/12/2013 11:20  Albumin ELP Latest Range: 55.8-66.1 % 51.1 (Carmen)  COMMENT (PROTEIN ELECTROPHOR) No range found *  Alpha-1-Globulin Latest Range: 2.9-4.9 % 7.4 (H)  Alpha-2-Globulin Latest Range: 7.1-11.8 % 11.5  Beta Globulin Latest Range: 4.7-7.2 % 6.5  Beta 2 Latest Range: 3.2-6.5 % 5.0  Gamma Globulin Latest Range: 11.1-18.8 % 18.5  M-SPIKE, % No range found NOT DET  SPE Interp. No range found *  IgG (Immunoglobin G), Serum Latest Range: [phone] mg/dL 4098  IgA Latest Range: 69-380 mg/dL 119  IgM, Serum Latest Range: 52-322 mg/dL 44 (Carmen)  Total Protein, Serum Electrophoresis Latest Range: 6.0-8.3 g/dL 6.9    RADIOGRAPHIC STUDIES: 1. Chest x-ray, 2 view, from 03/21/2010 showed a tortuous aorta and scoliosis, otherwise negative.  2. Metastatic bone survey was carried out at [facility] on 02/07/2011. This was negative. The report has been scanned into the Epic system.  3. Metastatic bone survey from 02/06/2012 showed no evidence for metastatic disease. There were no lytic lesions suggestive of multiple myeloma.  ASSESSMENT: Carmen Fisher 72 y.o. female with a history of Multiple myeloma  PLAN:  1. 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 --Ms. Masih continues to do well, now approaching over 3-  1/2 years from the time of diagnosis, which apparently was made in  December 2010, when a bone marrow at that time showed 50% plasma cells. The patient is more than 2 years out from the time of high-dose melphalan and autologous  stem cell transplant.   --Ms. Simerly is due for Zometa today. She receives this every 3 months. Her calcium is within normal limits.  She denies any dental procedures or  Problems.   --She will require her annual bone survey for her multiple myeloma in November.  She will continue with Revlimid 10 mg daily, 2 weeks out of every 3. She will continue on the Coumadin to maintain an INR somewhere around 1.5 since she is allergic to ASA.  --Her M spike is not detected.  IgG is 1,320 down from 1540 in July.  Her kappa:lambda ratio is 1.78 down from 1.96.  Her creatinine is within normal limits.    2. 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 --Her symptoms of numbness and tingling is stable.  She is able to complete her daily task including buttoning her shirts and ambulation without falls without difficulty.   3. Anemia.  --Her hemoglobin is 11 today.  Likely secondary to chronic disease.  She is asymptomatic presently. She noted a positive FOBT from her PCP's office and has  scheduled a colonoscopy in November.  We will check iron studies if it persists given positive FOBT.     4. Follow-up. The patient was seen by Dr. Barbaraann Boys in May, 14, 2014. We have asked Ms. Racz to return to see [place] in about 3 months,with repeat skeletal survey in the interim, CBC, Chemistries and Urine IFE and SPEP plus quantitative immunoglobins, kappa/lambda light chains.   All questions were answered. The patient knows to call the clinic with any problems, questions or concerns. We can certainly see the patient much sooner if necessary.  I spent 15 minutes counseling the patient face to face. The total time spent in the appointment was 25 minutes.    Sirinity Outland, MD 01/20/2013 8:39 AM

## 2013-01-20 NOTE — Patient Instructions (Signed)
Bone Scan Your caregiver has requested that you have a bone scan. In this test a very small amount of radioactive material is injected into a vein (intravenously). This radioactive material targets your bones, but the highest concentration occurs at areas of:  Injury.  Disease. Your bones can then be scanned by a machine that detects radioactive material. The scanning is painless. The scan can detect bone damage from various causes and will sometimes detect very small defects in bones that ordinary X-rays cannot. It can take up to 4 hours to finish a scan. Common uses for this exam include looking for:  Low bone density (osteopenia).  Not enough new bone forming or too much old bone being reabsorbed by the body (osteoporosis). LET YOUR CAREGIVERS KNOW ABOUT:  Allergies.  Medications taken including herbs, eye drops, over-the-counter medications, and creams.  Use of steroids (by mouth or creams).  Previous problems with anesthetics.  Possibility of pregnancy, if this applies.  History of blood clots (thrombophlebitis).  History of bleeding or blood problems.  Previous surgery.  Other health problems. PREPARATION  Drink extra fluids for 24 hours prior to the exam.  Do not have exams using barium or X-ray contrast medium for 48 hours before the exam.  Continue with normal routines regarding food and medications, or as told by your caregiver.  Do not wear jewelry or clothes with metal to the exam. BEFORE THE PROCEDURE You should be present 60 minutes prior to your procedure or as directed.  PROCEDURE   Radioactive material is injected intravenously over approximately 10 minutes.  There generally are no side effects from the injection.  Depending on the type of exam, images are taken at varying time intervals for up to 4 hours. Your technologist performing the test will let you know what time intervals will be used for your test. You may go on with normal activities in  between scans. DURING THE WAITING PERIODS  Continue drinking extra fluids.  Empty your bladder often as this eliminates the radioactive material that your bones do not use.  Eat and take medications normally during the test. AFTER THE PROCEDURE  You may return to all normal activities.  All of the radioactive material will eventually leave your body through your urine. The radioactive material used is in such a small amount you should have no long-term effect from this test.  The radioactive material that you will be peeing out will not be harmful for your family or other people around you. Your scan will be interpreted by a specialist in reading X-rays (radiologist) with special training in nuclear medicine. He or she will send a report of this scan to your primary caregiver. It may take a couple days for your primary caregiver to receive and review these results. Find out how you are to receive your results, or call for your results as instructed by your caregiver. Remember, it is your responsibility to obtain the results of your procedure. Do not assume everything is fine because you do not hear from your caregiver. Document Released: 03/15/2000 Document Revised: 06/10/2011 Document Reviewed: 05/16/2008 [facility] Patient Information 2014 [place], [place]. Multiple Myeloma Multiple myeloma is the most common cancer of bone. It is caused by the uncontrolled multiplication of a type of white blood cell in the marrow. This white blood cell is called a plasma cell. This means the bone marrow is overworking producing plasma cells. Soon these overproduced cells begin to take up room in the marrow that is needed by other  cells. This means that there are soon not enough red or white blood cells or platelets. Not enough red cells mean that the person is anemic. There are not enough red blood cells to carry oxygen around the body. There are not enough white blood cells to fight disease. This causes the  person with multiple myeloma to not feel well. There is also bone pain through much of the body. SYMPTOMS  Anemia causes fatigue (tiredness) and weakness.  Back pain is common. This is from fractures (break in bones) caused by damage to the bones of the back.  Lack of white blood cells makes infection more likely.  Bleeding is a common problem from lack of the cells (platelets). Platelets help blood clots form. This may show up as bleeding from any place. Commonly this shows up as bleeding from the nose or gums.  Fractures (bone breaks) are more common anywhere. The back and ribs are the most commonly fractured areas. DIAGNOSIS  This tumor is often suggested by blood tests. Often doing a bone marrow sample makes the diagnosis (learning what is wrong). This is a test performed by taking a small sample of bone with a small needle. This bone often comes from the sternum (breast bone). This sample is sent to a pathologist (a specialist in looking at tissue under a microscope). After looking at the sample under the microscope, the pathologist is able to make a diagnosis of the problem. X-rays may also show boney changes. TREATMENT   Occasionally, anti-cancer medications may be used with multiple myeloma. Your caregiver can discuss this with you.  Medications can also be given to help with the bone pain.  There is no cure for multiple myeloma. Lifestyle changes can add years of quality living. HOME CARE INSTRUCTIONS  Often there is no specific treatment for multiple myeloma. Most of the treatment consists of adjustments in dietary and living activities. Some of these changes include:  Your dietitian or caregiver helping you with your dietary questions.  Taking iron and vitamins as prescribed by your caregiver.  Eating a well balanced diet.  Staying active, but follow restrictions suggested by your caregiver. Avoiding heavy lifting (more than 10 pounds) and activities that cause increased  pain.  Drinking plenty of water.  Using back braces and a cane may help with some of the boney pain. SEEK IMMEDIATE MEDICAL CARE IF:  You develop severe, uncontrolled boney pain.  You or your family notices confusion, problems with decision-making or inability to stay awake.  You notice increased urination or constipation.  You notice problems holding your water or stool.  You have numbness or loss of control of your extremities (arms/hands or legs/feet). Document Released: 12/11/2000 Document Revised: 06/10/2011 Document Reviewed: 03/13/2008 [facility] Patient Information 2014 [place], [place]. Zoledronic Acid injection (Hypercalcemia, Oncology) What is this medicine? ZOLEDRONIC ACID (ZOE le dron ik AS id) lowers the amount of calcium loss from bone. It is used to treat too much calcium in your blood from cancer. It is also used to prevent complications of cancer that has spread to the bone. This medicine may be used for other purposes; ask your health care provider or pharmacist if you have questions. What should I tell my health care provider before I take this medicine? They need to know if you have any of these conditions: -aspirin-sensitive asthma -dental disease -kidney disease -an unusual or allergic reaction to zoledronic acid, other medicines, foods, dyes, or preservatives -pregnant or trying to get pregnant -breast-feeding How should I  use this medicine? This medicine is for infusion into a vein. It is given by a health care professional in a hospital or clinic setting. Talk to your pediatrician regarding the use of this medicine in children. Special care may be needed. Overdosage: If you think you have taken too much of this medicine contact a poison control center or emergency room at once. NOTE: This medicine is only for you. Do not share this medicine with others. What if I miss a dose? It is important not to miss your dose. Call your doctor or health care professional  if you are unable to keep an appointment. What may interact with this medicine? -certain antibiotics given by injection -NSAIDs, medicines for pain and inflammation, like ibuprofen or naproxen -some diuretics like bumetanide, furosemide -teriparatide -thalidomide This list may not describe all possible interactions. Give your health care provider a list of all the medicines, herbs, non-prescription drugs, or dietary supplements you use. Also tell them if you smoke, drink alcohol, or use illegal drugs. Some items may interact with your medicine. What should I watch for while using this medicine? Visit your doctor or health care professional for regular checkups. It may be some time before you see the benefit from this medicine. Do not stop taking your medicine unless your doctor tells you to. Your doctor may order blood tests or other tests to see how you are doing. Women should inform their doctor if they wish to become pregnant or think they might be pregnant. There is a potential for serious side effects to an unborn child. Talk to your health care professional or pharmacist for more information. You should make sure that you get enough calcium and vitamin D while you are taking this medicine. Discuss the foods you eat and the vitamins you take with your health care professional. Some people who take this medicine have severe bone, joint, and/or muscle pain. This medicine may also increase your risk for a broken thigh bone. Tell your doctor right away if you have pain in your upper leg or groin. Tell your doctor if you have any pain that does not go away or that gets worse. What side effects may I notice from receiving this medicine? Side effects that you should report to your doctor or health care professional as soon as possible: -allergic reactions like skin rash, itching or hives, swelling of the face, lips, or tongue -anxiety, confusion, or depression -breathing problems -changes in  vision -feeling faint or lightheaded, falls -jaw burning, cramping, pain -muscle cramps, stiffness, or weakness -trouble passing urine or change in the amount of urine Side effects that usually do not require medical attention (report to your doctor or health care professional if they continue or are bothersome): -bone, joint, or muscle pain -fever -hair loss -irritation at site where injected -loss of appetite -nausea, vomiting -stomach upset -tired This list may not describe all possible side effects. Call your doctor for medical advice about side effects. You may report side effects to FDA at 1-800-FDA-1088. Where should I keep my medicine? This drug is given in a hospital or clinic and will not be stored at home. NOTE: This sheet is a summary. It may not cover all possible information. If you have questions about this medicine, talk to your doctor, pharmacist, or health care provider.  2013, [employer]. (09/14/2010 9:06:58 AM)

## 2013-01-20 NOTE — Telephone Encounter (Signed)
gv and printed appt sched and avs for pt for Jan 2015 °

## 2013-01-20 NOTE — Telephone Encounter (Signed)
Per staff message and POF I have scheduled appts.  JMW  

## 2013-01-20 NOTE — Patient Instructions (Signed)
Zoledronic Acid injection (Hypercalcemia, Oncology) What is this medicine? ZOLEDRONIC ACID (ZOE le dron ik AS id) lowers the amount of calcium loss from bone. It is used to treat too much calcium in your blood from cancer. It is also used to prevent complications of cancer that has spread to the bone. This medicine may be used for other purposes; ask your health care provider or pharmacist if you have questions. What should I tell my health care provider before I take this medicine? They need to know if you have any of these conditions: -aspirin-sensitive asthma -dental disease -kidney disease -an unusual or allergic reaction to zoledronic acid, other medicines, foods, dyes, or preservatives -pregnant or trying to get pregnant -breast-feeding How should I use this medicine? This medicine is for infusion into a vein. It is given by a health care professional in a hospital or clinic setting. Talk to your pediatrician regarding the use of this medicine in children. Special care may be needed. Overdosage: If you think you have taken too much of this medicine contact a poison control center or emergency room at once. NOTE: This medicine is only for you. Do not share this medicine with others. What if I miss a dose? It is important not to miss your dose. Call your doctor or health care professional if you are unable to keep an appointment. What may interact with this medicine? -certain antibiotics given by injection -NSAIDs, medicines for pain and inflammation, like ibuprofen or naproxen -some diuretics like bumetanide, furosemide -teriparatide -thalidomide This list may not describe all possible interactions. Give your health care provider a list of all the medicines, herbs, non-prescription drugs, or dietary supplements you use. Also tell them if you smoke, drink alcohol, or use illegal drugs. Some items may interact with your medicine. What should I watch for while using this medicine? Visit  your doctor or health care professional for regular checkups. It may be some time before you see the benefit from this medicine. Do not stop taking your medicine unless your doctor tells you to. Your doctor may order blood tests or other tests to see how you are doing. Women should inform their doctor if they wish to become pregnant or think they might be pregnant. There is a potential for serious side effects to an unborn child. Talk to your health care professional or pharmacist for more information. You should make sure that you get enough calcium and vitamin D while you are taking this medicine. Discuss the foods you eat and the vitamins you take with your health care professional. Some people who take this medicine have severe bone, joint, and/or muscle pain. This medicine may also increase your risk for a broken thigh bone. Tell your doctor right away if you have pain in your upper leg or groin. Tell your doctor if you have any pain that does not go away or that gets worse. What side effects may I notice from receiving this medicine? Side effects that you should report to your doctor or health care professional as soon as possible: -allergic reactions like skin rash, itching or hives, swelling of the face, lips, or tongue -anxiety, confusion, or depression -breathing problems -changes in vision -feeling faint or lightheaded, falls -jaw burning, cramping, pain -muscle cramps, stiffness, or weakness -trouble passing urine or change in the amount of urine Side effects that usually do not require medical attention (report to your doctor or health care professional if they continue or are bothersome): -bone, joint, or muscle pain -  fever -hair loss -irritation at site where injected -loss of appetite -nausea, vomiting -stomach upset -tired This list may not describe all possible side effects. Call your doctor for medical advice about side effects. You may report side effects to FDA at  1-800-FDA-1088. Where should I keep my medicine? This drug is given in a hospital or clinic and will not be stored at home. NOTE: This sheet is a summary. It may not cover all possible information. If you have questions about this medicine, talk to your doctor, pharmacist, or health care provider.  2013, [employer]. (09/14/2010 9:06:58 AM)  

## 2013-02-01 ENCOUNTER — Telehealth: Payer: Self-pay | Admitting: Medical Oncology

## 2013-02-01 DIAGNOSIS — C9000 Multiple myeloma not having achieved remission: Secondary | ICD-10-CM

## 2013-02-01 MED ORDER — LENALIDOMIDE 10 MG PO CAPS: ORAL_CAPSULE | ORAL | Status: DC

## 2013-02-03 ENCOUNTER — Other Ambulatory Visit: Payer: Self-pay

## 2013-02-03 DIAGNOSIS — Z1231 Encounter for screening mammogram for malignant neoplasm of breast: Secondary | ICD-10-CM

## 2013-02-12 ENCOUNTER — Ambulatory Visit (HOSPITAL_COMMUNITY)
Admission: RE | Admit: 2013-02-12 | Discharge: 2013-02-12 | Disposition: A | Discharge status: Home / Self Care | Payer: Medicare Other | Source: Ambulatory Visit | Attending: Internal Medicine | Admitting: Internal Medicine

## 2013-02-12 DIAGNOSIS — M5030 Other cervical disc degeneration, unspecified cervical region: Secondary | ICD-10-CM | POA: Insufficient documentation

## 2013-02-12 DIAGNOSIS — C9000 Multiple myeloma not having achieved remission: Secondary | ICD-10-CM

## 2013-02-15 ENCOUNTER — Telehealth: Payer: Self-pay | Admitting: Internal Medicine

## 2013-02-15 NOTE — Telephone Encounter (Signed)
Skeletal survey was negative.  Patient is aware.

## 2013-02-22 ENCOUNTER — Other Ambulatory Visit: Payer: Self-pay | Admitting: *Deleted

## 2013-02-22 DIAGNOSIS — C9000 Multiple myeloma not having achieved remission: Secondary | ICD-10-CM

## 2013-02-22 MED ORDER — LENALIDOMIDE 10 MG PO CAPS: ORAL_CAPSULE | ORAL | Status: DC

## 2013-02-23 NOTE — Telephone Encounter (Signed)
RECEIVED A FAX FROM [facility] CONCERNING A CONFIRMATION OF FACSIMILE RECEIPT FOR PT. REFERRAL. COPAY AMOUNT IS ZERO.

## 2013-03-04 ENCOUNTER — Ambulatory Visit
Admission: RE | Admit: 2013-03-04 | Discharge: 2013-03-04 | Disposition: A | Discharge status: Home / Self Care | Payer: Medicare Other | Source: Ambulatory Visit

## 2013-03-04 DIAGNOSIS — Z1231 Encounter for screening mammogram for malignant neoplasm of breast: Secondary | ICD-10-CM

## 2013-03-10 ENCOUNTER — Telehealth: Payer: Self-pay | Admitting: Internal Medicine

## 2013-03-10 ENCOUNTER — Other Ambulatory Visit: Payer: Self-pay

## 2013-03-10 DIAGNOSIS — C9000 Multiple myeloma not having achieved remission: Secondary | ICD-10-CM

## 2013-03-10 MED ORDER — LENALIDOMIDE 10 MG PO CAPS: ORAL_CAPSULE | ORAL | Status: DC

## 2013-03-10 NOTE — Telephone Encounter (Signed)
s.w. pt and sched 12.17 lab per orders/pof...pt ok and awre

## 2013-03-10 NOTE — Progress Notes (Signed)
S/w pt that Dr Rosie Fate wants her to have monthly labs, call forwarded to scheduler

## 2013-03-12 NOTE — Telephone Encounter (Signed)
RECEIVED A FAX FROM [facility] CONCERNING A CONFIRMATION OF PRESCRIPTION SHIPMENT FOR REVLIMID ON 03/12/13.

## 2013-03-17 ENCOUNTER — Other Ambulatory Visit (HOSPITAL_BASED_OUTPATIENT_CLINIC_OR_DEPARTMENT_OTHER): Payer: Medicare Other

## 2013-03-17 DIAGNOSIS — C9000 Multiple myeloma not having achieved remission: Secondary | ICD-10-CM

## 2013-03-17 LAB — CBC WITH DIFFERENTIAL/PLATELET
BASO%: 1.1 % (ref 0.0–2.0)
Basophils Absolute: 0 10*3/uL (ref 0.0–0.1)
EOS%: 1.2 % (ref 0.0–7.0)
Eosinophils Absolute: 0.1 10*3/uL (ref 0.0–0.5)
HCT: 35.1 % (ref 34.8–46.6)
HGB: 11.6 g/dL (ref 11.6–15.9)
LYMPH%: 46.9 % (ref 14.0–49.7)
MCH: 33.2 pg (ref 25.1–34.0)
MCHC: 33.1 g/dL (ref 31.5–36.0)
MCV: 100.3 fL (ref 79.5–101.0)
MONO#: 0.4 10*3/uL (ref 0.1–0.9)
MONO%: 9.4 % (ref 0.0–14.0)
NEUT#: 1.8 10*3/uL (ref 1.5–6.5)
NEUT%: 41.4 % (ref 38.4–76.8)
Platelets: 180 10*3/uL (ref 145–400)
RBC: 3.5 10*6/uL — ABNORMAL LOW (ref 3.70–5.45)
RDW: 15.3 % — ABNORMAL HIGH (ref 11.2–14.5)
WBC: 4.4 10*3/uL (ref 3.9–10.3)
lymph#: 2.1 10*3/uL (ref 0.9–3.3)

## 2013-03-17 LAB — COMPREHENSIVE METABOLIC PANEL (CC13)
ALT: 23 U/L (ref 0–55)
AST: 19 U/L (ref 5–34)
Albumin: 3.6 g/dL (ref 3.5–5.0)
Alkaline Phosphatase: 63 U/L (ref 40–150)
Anion Gap: 8 mEq/L (ref 3–11)
BUN: 12 mg/dL (ref 7.0–26.0)
CO2: 25 mEq/L (ref 22–29)
Calcium: 9.7 mg/dL (ref 8.4–10.4)
Chloride: 110 mEq/L — ABNORMAL HIGH (ref 98–109)
Creatinine: 0.9 mg/dL (ref 0.6–1.1)
Glucose: 106 mg/dl (ref 70–140)
Potassium: 4.1 mEq/L (ref 3.5–5.1)
Sodium: 143 mEq/L (ref 136–145)
Total Bilirubin: 0.39 mg/dL (ref 0.20–1.20)
Total Protein: 7.3 g/dL (ref 6.4–8.3)

## 2013-03-29 ENCOUNTER — Other Ambulatory Visit: Payer: Self-pay | Admitting: Internal Medicine

## 2013-04-02 ENCOUNTER — Other Ambulatory Visit: Payer: Self-pay | Admitting: Medical Oncology

## 2013-04-02 DIAGNOSIS — C9000 Multiple myeloma not having achieved remission: Secondary | ICD-10-CM

## 2013-04-02 MED ORDER — LENALIDOMIDE 10 MG PO CAPS
ORAL_CAPSULE | ORAL | Status: DC
Start: 2013-04-02 — End: 2013-04-28

## 2013-04-05 ENCOUNTER — Other Ambulatory Visit: Payer: Self-pay | Admitting: Medical Oncology

## 2013-04-05 ENCOUNTER — Encounter: Payer: Self-pay | Admitting: *Deleted

## 2013-04-05 DIAGNOSIS — C9000 Multiple myeloma not having achieved remission: Secondary | ICD-10-CM

## 2013-04-05 MED ORDER — POTASSIUM CHLORIDE CRYS ER 20 MEQ PO TBCR: 30.0000 meq | EXTENDED_RELEASE_TABLET | Freq: Every day | ORAL | Status: DC

## 2013-04-05 NOTE — Progress Notes (Addendum)
RECEIVED TWO FAXES FROM [facility] CONCERNING CONFIRMATION OF PT.'S PRESCRIPTION BENEFITS. PT. MAY QUALIFY FOR COPAY ASSISTANCE. PT. WILL BE CONTACTED BY A PATIENT CARE COORDINATOR FROM THE [facility]DING DEPARTMENT TO DISCUSS POSSIBLE OPTIONS. PT.'S PRESCRIPTION FOR REVLIMID WILL BE SHIPPED ON 04/06/13.

## 2013-04-07 ENCOUNTER — Other Ambulatory Visit (HOSPITAL_BASED_OUTPATIENT_CLINIC_OR_DEPARTMENT_OTHER): Payer: Medicare Other

## 2013-04-07 DIAGNOSIS — D649 Anemia, unspecified: Secondary | ICD-10-CM

## 2013-04-07 DIAGNOSIS — C9000 Multiple myeloma not having achieved remission: Secondary | ICD-10-CM

## 2013-04-07 LAB — CBC WITH DIFFERENTIAL/PLATELET
BASO%: 1 % (ref 0.0–2.0)
Basophils Absolute: 0.1 10*3/uL (ref 0.0–0.1)
EOS%: 1.6 % (ref 0.0–7.0)
Eosinophils Absolute: 0.1 10*3/uL (ref 0.0–0.5)
HCT: 37.2 % (ref 34.8–46.6)
HGB: 11.9 g/dL (ref 11.6–15.9)
LYMPH%: 40.4 % (ref 14.0–49.7)
MCH: 32.2 pg (ref 25.1–34.0)
MCHC: 32.1 g/dL (ref 31.5–36.0)
MCV: 100.4 fL (ref 79.5–101.0)
MONO#: 0.4 10*3/uL (ref 0.1–0.9)
MONO%: 8.5 % (ref 0.0–14.0)
NEUT#: 2.5 10*3/uL (ref 1.5–6.5)
NEUT%: 48.5 % (ref 38.4–76.8)
Platelets: 184 10*3/uL (ref 145–400)
RBC: 3.71 10*6/uL (ref 3.70–5.45)
RDW: 15.6 % — ABNORMAL HIGH (ref 11.2–14.5)
WBC: 5.1 10*3/uL (ref 3.9–10.3)
lymph#: 2 10*3/uL (ref 0.9–3.3)

## 2013-04-09 LAB — SPEP & IFE WITH QIG
Albumin ELP: 54.3 % — ABNORMAL LOW (ref 55.8–66.1)
Alpha-1-Globulin: 3.8 % (ref 2.9–4.9)
Alpha-2-Globulin: 11.1 % (ref 7.1–11.8)
Beta 2: 5.9 % (ref 3.2–6.5)
Beta Globulin: 6 % (ref 4.7–7.2)
Gamma Globulin: 18.9 % — ABNORMAL HIGH (ref 11.1–18.8)
IgA: 276 mg/dL (ref 69–380)
IgG (Immunoglobin G), Serum: 1470 mg/dL (ref 690–1700)
IgM, Serum: 32 mg/dL — ABNORMAL LOW (ref 52–322)
Total Protein, Serum Electrophoresis: 7.4 g/dL (ref 6.0–8.3)

## 2013-04-09 LAB — KAPPA/LAMBDA LIGHT CHAINS
Kappa free light chain: 5.89 mg/dL — ABNORMAL HIGH (ref 0.33–1.94)
Kappa:Lambda Ratio: 2.16 — ABNORMAL HIGH (ref 0.26–1.65)
Lambda Free Lght Chn: 2.73 mg/dL — ABNORMAL HIGH (ref 0.57–2.63)

## 2013-04-13 LAB — UIFE/LIGHT CHAINS/TP QN, 24-HR UR
Albumin, U: DETECTED
Alpha 1, Urine: DETECTED — AB
Alpha 2, Urine: DETECTED — AB
Beta, Urine: DETECTED — AB
Free Kappa Lt Chains,Ur: 1.05 mg/dL (ref 0.14–2.42)
Free Kappa/Lambda Ratio: 0.95 ratio — ABNORMAL LOW (ref 2.04–10.37)
Free Lambda Excretion/Day: 25.85 mg/d
Free Lambda Lt Chains,Ur: 1.1 mg/dL — ABNORMAL HIGH (ref 0.02–0.67)
Free Lt Chn Excr Rate: 24.68 mg/d
Gamma Globulin, Urine: DETECTED — AB
Time: 24 hours
Total Protein, Urine-Ur/day: 73 mg/d (ref 10–140)
Total Protein, Urine: 3.1 mg/dL
Volume, Urine: 2350 mL

## 2013-04-14 ENCOUNTER — Ambulatory Visit: Payer: Medicare Other | Admitting: Hematology and Oncology

## 2013-04-14 ENCOUNTER — Other Ambulatory Visit: Payer: Medicare Other | Admitting: Lab

## 2013-04-15 ENCOUNTER — Ambulatory Visit (HOSPITAL_BASED_OUTPATIENT_CLINIC_OR_DEPARTMENT_OTHER): Payer: Medicare Other

## 2013-04-15 ENCOUNTER — Other Ambulatory Visit: Payer: Self-pay

## 2013-04-15 ENCOUNTER — Telehealth: Payer: Self-pay | Admitting: Internal Medicine

## 2013-04-15 ENCOUNTER — Ambulatory Visit (HOSPITAL_BASED_OUTPATIENT_CLINIC_OR_DEPARTMENT_OTHER): Payer: Medicare Other | Admitting: Internal Medicine

## 2013-04-15 ENCOUNTER — Encounter (INDEPENDENT_AMBULATORY_CARE_PROVIDER_SITE_OTHER): Payer: Self-pay

## 2013-04-15 VITALS — BP 152/79 | HR 68 | Temp 97.7°F | Resp 20 | Ht 66.0 in | Wt 168.8 lb

## 2013-04-15 DIAGNOSIS — C9000 Multiple myeloma not having achieved remission: Secondary | ICD-10-CM

## 2013-04-15 DIAGNOSIS — G620 Drug-induced polyneuropathy: Secondary | ICD-10-CM

## 2013-04-15 DIAGNOSIS — D649 Anemia, unspecified: Secondary | ICD-10-CM

## 2013-04-15 DIAGNOSIS — IMO0002 Reserved for concepts with insufficient information to code with codable children: Secondary | ICD-10-CM

## 2013-04-15 MED ORDER — ALLOPURINOL 300 MG PO TABS: ORAL_TABLET | ORAL | Status: DC

## 2013-04-15 MED ORDER — ZOLEDRONIC ACID 4 MG/100ML IV SOLN
4.0000 mg | Freq: Once | INTRAVENOUS | Status: AC
  Administered 2013-04-15: 4 mg via INTRAVENOUS
  Filled 2013-04-15: qty 100

## 2013-04-15 NOTE — Patient Instructions (Signed)

## 2013-04-15 NOTE — Telephone Encounter (Signed)
gv and printed appt sched na [facility] for pt for April...sed added tx.

## 2013-04-15 NOTE — Telephone Encounter (Signed)
gv and printed another sched with Feb thru April appts.Marland KitchenMarland KitchenMarland Kitchen

## 2013-04-15 NOTE — Telephone Encounter (Signed)
lvm that a refil for allopurinol was sent to pharmacy

## 2013-04-16 NOTE — Progress Notes (Signed)
[place] OFFICE PROGRESS NOTE  [name], MD [address] [place] [id number]  DIAGNOSIS: Multiple myeloma - Plan: CBC with Differential, Comprehensive metabolic panel (Cmet) - [facility], Lactate dehydrogenase (LDH) - [facility], Kappa/lambda light chains, SPEP & IFE with QIG  Peripheral neuropathy, secondary to drugs or chemicals  Chief Complaint  Patient presents with  . Multiple Myeloma    CURRENT THERAPY:-Revlimid 10 mg daily, 2 weeks on, 1 week off. Revlimid was started on 09/24/2010. Zometa 4 mg IV currently being given every 3 months. This was started on 09/28/2010 and initially was given monthly. Coumadin 6 mg daily to maintain an INR of approximately 1.5. This is being followed by the [facility].   IMMUNIZATIONS:  Pneumovax was given in July 2012.  Flu shot was given on February 12, 2011.  Inoculations following high-dose therapy and autologous stem cell infusion were given on May 05, 2011.  INTERVAL HISTORY: [place] 73 y.o. female with a history of Multiple myeloma is here for follow-up. She was last seen by me on 01/20/2013.  Clinically, she continues to do well with no change in her condition.  She denies any side effects from the IV Zometa that she receives every 3 months.  She is on Revlimid 10 mg daily by mouth, 2 weeks out of every 3 weeks.  She is also followed by [facility] about once a year.  She continues to have grade 1 neuropathy symptoms but otherwise is without problems.  As previously reported, she was admitted to [facility] for AMS and fever secondary to complicated UTI sepsis due to E.Coli pyelonephritis and bacteremia.  She completed a course of antibiotics and reports complete resolution.  She denies nausea/vomiting, abdominal, fevers/chills or acute shortness of breath.  She denies hematuria or dysuria. She also denies bone pain or night sweats or weight loss. She did see a urologist due to urinary urgency  with a normal bladder ultrasound.  She denies a recent hospitalization or emergency room visit.   MEDICAL HISTORY: Past Medical History  Diagnosis Date  . Multiple myeloma, without mention of having achieved remission(203.00)   . Anemia, unspecified   . Hypertension   . Peripheral neuropathy   . Dyslipidemia   . Glaucoma   . Multiple myeloma   . Chronic anticoagulation   . Glaucoma   . Gout   . Osteoporosis   . Hypokalemia   . GERD (gastroesophageal reflux disease)     INTERIM HISTORY: has Multiple myeloma; Peripheral neuropathy, secondary to drugs or chemicals; Multiple myeloma; Multiple myeloma; Chronic anticoagulation; UTI (lower urinary tract infection); Hypotension; Altered mental status; Dehydration; Leukopenia; ARF (acute renal failure); Elevated liver enzymes; Sepsis; and Neutropenic fever on her problem list.    ALLERGIES:  is allergic to aspirin; aspirin; sulfa antibiotics; and sulfa antibiotics.  MEDICATIONS: has a current medication list which includes the following prescription(s): acetaminophen, magic mouthwash, cholecalciferol, gabapentin, latanoprost, lenalidomide, loperamide, lorazepam, oxycodone, pantoprazole, potassium chloride sa, prochlorperazine, timolol, warfarin, zolendronic acid, allopurinol, and multivitamin with minerals.  SURGICAL HISTORY:  Past Surgical History  Procedure Laterality Date  . Appendectomy    . Abdominal hysterectomy  1994    w/BSO   PROBLEM LIST:  1. Multiple myeloma diagnosed in August 2010. She has kappa light chain disease. In addition, she was found to have monoclonal IgG as well in her urine back at the time of diagnosis. Bone marrow initially showed 50% plasma cells. Metastatic bone survey carried out on March 30, 2009 was negative. Ms. Ellerson was initially treated with Velcade, Decadron and Doxil for 4-1/2 cycles from 04/24/2009 through 07/27/2009. She developed peripheral neuropathy. Another bone marrow was carried out on  08/03/2009,  showed 30% plasma cells. Revlimid and Decadron were given from 09/01/2009 through December 2011. Bone marrow on March 01, 2010 showed 5% plasma cells. 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 She has had an excellent response to that treatment. Since June 2012, she has been receiving maintenance Revlimid and Zometa. Serum immunofixation electrophoresis from 10/26/2010 was negative. Urine immunofixation electrophoresis from July 2012 continues to show monoclonal free kappa light chains. Total urine protein was 125 mg at that time. Back at the time of diagnosis in December 2010, the 24-hour urine protein was over 8 g. The patient's most recent metastatic bone survey carried out at [facility] on 02/07/2011 was Negative. A 24 hour urine collection from 02/06/2012 yielded a protein of 704 mg as compared with 218 mg on 07/30/2011, 125 mg on 04/19/2011 and 62 mg on 07/10/2010. The free kappa light chain excretion was 123.5 mg suggesting that most of the protein is not the kappa light chain. The urine kappa to lambda ratio was 31.88 and the urine immunofixation electrophoresis was positive for monoclonal kappa light  chains. Urine protein to creatinine ratio was 0.04. Serum immunofixation electrophoresis from 02/06/2012 showed no monoclonal protein. IgG level was 1530, IgA level 187 and IgM level 32, the latter being low. Metastatic bone survey from 02/06/2012 showed no evidencefor metastatic disease. A 24-hour urine collection from 04/30/2012 yielded 342 mg of protein, of which 304 mg were monoclonal kappa light chains. Urine immunofixation electrophoresis was positive for monoclonal free kappa light chains.  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 Symptoms developed in April 2011. The patient is currently on Neurontin.  3. Hypertension.  4. GERD.  5. Dyslipidemia.  6. Glaucoma. 7. Admitted to ICU with E.coli bacteremia secondary to  pyelonephritis (11/2012).  REVIEW OF SYSTEMS:   Constitutional: Denies fevers, chills or abnormal weight loss Eyes: Denies blurriness of vision Ears, nose, mouth, throat, and face: Denies mucositis or sore throat Respiratory: Denies cough, dyspnea or wheezes Cardiovascular: Denies palpitation, chest discomfort or lower extremity swelling Gastrointestinal:  Denies nausea, heartburn or change in bowel habits Skin: Denies abnormal skin rashes Lymphatics: Denies new lymphadenopathy or easy bruising Neurological:Denies numbness, tingling or new weaknesses Behavioral/Psych: Mood is stable, no new changes  All other systems were reviewed with the patient and are negative.  PHYSICAL EXAMINATION: ECOG PERFORMANCE STATUS: 0 - Asymptomatic  Blood pressure 152/79, pulse 68, temperature 97.7 F (36.5 C), temperature source Oral, resp. rate 20, height 5' 6"  (1.676 m), weight 168 lb 12.8 oz (76.567 kg), SpO2 100.00%.  GENERAL:alert, no distress and comfortable; well developed and well nourished.  SKIN: skin color, texture, turgor are normal, no rashes or significant lesions EYES: normal, Conjunctiva are pink and non-injected, sclera clear OROPHARYNX:no exudate, no erythema and lips, buccal mucosa, and tongue normal  NECK: supple, thyroid normal size, non-tender, without nodularity LYMPH:  no palpable lymphadenopathy in the cervical, axillary or supraclavicular LUNGS: clear to auscultation and percussion with normal breathing effort HEART: regular rate & rhythm and SEM and no lower extremity edema ABDOMEN:abdomen soft, non-tender and normal bowel sounds Musculoskeletal:no cyanosis of digits and no clubbing  NEURO: alert & oriented x 3 with fluent speech, no focal motor/sensory deficits  Labs:  Lab Results  Component Value Date   WBC 5.1 04/07/2013  HGB 11.9 04/07/2013   HCT 37.2 04/07/2013   MCV 100.4 04/07/2013   PLT 184 04/07/2013   NEUTROABS 2.5 04/07/2013      Chemistry      Component Value  Date/Time   NA 143 03/17/2013 0910   NA 142 12/08/2012 0640   K 4.1 03/17/2013 0910   K 3.0* 12/08/2012 0640   CL 109 12/08/2012 0640   CL 106 07/28/2012 0924   CO2 25 03/17/2013 0910   CO2 23 12/08/2012 0640   BUN 12.0 03/17/2013 0910   BUN 11 12/08/2012 0640   CREATININE 0.9 03/17/2013 0910   CREATININE 0.83 12/08/2012 0640   CREATININE 0.93 10/01/2010 0836      Component Value Date/Time   CALCIUM 9.7 03/17/2013 0910   CALCIUM 7.8* 12/08/2012 0640   ALKPHOS 63 03/17/2013 0910   ALKPHOS 68 12/08/2012 0640   AST 19 03/17/2013 0910   AST 47* 12/08/2012 0640   ALT 23 03/17/2013 0910   ALT 52* 12/08/2012 0640   BILITOT 0.39 03/17/2013 0910   BILITOT 0.4 12/08/2012 0640     GFR Estimated Creatinine Clearance: 60.8 ml/min (by C-G formula based on Cr of 0.9).  Results for HADLEA, FURUYA (MRN [id number]) as of 04/16/2013 09:33  Ref. Range 04/07/2013 10:47  Albumin ELP Latest Range: 55.8-66.1 % 54.3 (L)  COMMENT (PROTEIN ELECTROPHOR) No range found *  Alpha-1-Globulin Latest Range: 2.9-4.9 % 3.8  Alpha-2-Globulin Latest Range: 7.1-11.8 % 11.1  Beta Globulin Latest Range: 4.7-7.2 % 6.0  Beta 2 Latest Range: 3.2-6.5 % 5.9  Gamma Globulin Latest Range: 11.1-18.8 % 18.9 (H)  M-SPIKE, % No range found NOT DET  SPE Interp. No range found *  IgG (Immunoglobin G), Serum Latest Range: [phone] mg/dL 1470  IgA Latest Range: 69-380 mg/dL 276  IgM, Serum Latest Range: 52-322 mg/dL 32 (L)  Total Protein, Serum Electrophoresis Latest Range: 6.0-8.3 g/dL 7.4  Kappa free light chain Latest Range: 0.33-1.94 mg/dL 5.89 (H)  Lambda Free Lght Chn Latest Range: 0.57-2.63 mg/dL 2.73 (H)  Kappa:Lambda Ratio Latest Range: 0.26-1.65  2.16 (H)   RADIOGRAPHIC STUDIES: 1. Chest x-ray, 2 view, from 03/21/2010 showed a tortuous aorta and scoliosis, otherwise negative.  2. Metastatic bone survey was carried out at [facility] on 02/07/2011. This was negative. The report has been scanned into the Epic system.  3. Metastatic bone survey  from 02/06/2012 showed no evidence for metastatic disease. There were no lytic lesions suggestive of multiple myeloma. 4. Metastatic bone survey from 02/15/2013 showed no evidence for metastatic disease. There were no lytic lesions suggestive of multiple myeloma.  ASSESSMENT: MARKI FREDE 73 y.o. female with a history of Multiple myeloma - Plan: CBC with Differential, Comprehensive metabolic panel (Cmet) - [facility], Lactate dehydrogenase (LDH) - [facility], Kappa/lambda light chains, SPEP & IFE with QIG  Peripheral neuropathy, secondary to drugs or chemicals  PLAN:  1. 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 --Ms. Direnzo continues to do well, now approaching over 4 years from the time of diagnosis, which apparently was made in December 2010, when a bone marrow at that time showed 50% plasma cells. The patient is more than 2 years out from the time of high-dose melphalan and autologous stem cell transplant.   --Ms. Nuncio is due for Zometa today. She receives this every 3 months. Her calcium is within normal limits.  She denies any dental procedures or  Problems.   --She will require her annual bone survey for  her multiple myeloma in November.  She will continue with Revlimid 10 mg daily, 2 weeks out of every 3. She will continue on the Coumadin to maintain an INR somewhere around 1.5 since she is allergic to ASA.  --Her M spike is not detected.  IgG is 1470.Marland Kitchen  Her kappa:lambda ratio is 2.16.  Her creatinine is within normal limits.    2. 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 --Her symptoms of numbness and tingling is stable.  She is able to complete her daily task including buttoning her shirts and ambulation without falls without difficulty.   3. Anemia.  --Her hemoglobin is 11.9 today.  Likely secondary to chronic disease.  She is asymptomatic presently.    4. Follow-up. The patient was seen by Dr. Alvie Heidelberg in May 2015. We have asked Ms. Grunwald to return  to see [place] in about 3 months,with repeat skeletal survey in the interim, CBC, Chemistries and Urine IFE and SPEP plus quantitative immunoglobins, kappa/lambda light chains.   All questions were answered. The patient knows to call the clinic with any problems, questions or concerns. We can certainly see the patient much sooner if necessary.  I spent 15 minutes counseling the patient face to face. The total time spent in the appointment was 25 minutes.    Anelle Parlow, MD 04/16/2013 9:34 AM

## 2013-04-27 ENCOUNTER — Other Ambulatory Visit: Payer: Self-pay | Admitting: *Deleted

## 2013-04-27 NOTE — Telephone Encounter (Signed)
THIS REFILL REQUEST FOR REVLIMID WAS GIVEN TO DR.CHISM'S NURSE, ROBIN BASS,RN. 

## 2013-04-28 ENCOUNTER — Other Ambulatory Visit: Payer: Self-pay

## 2013-04-28 DIAGNOSIS — C9000 Multiple myeloma not having achieved remission: Secondary | ICD-10-CM

## 2013-04-28 MED ORDER — LENALIDOMIDE 10 MG PO CAPS: ORAL_CAPSULE | ORAL | Status: DC

## 2013-04-30 ENCOUNTER — Other Ambulatory Visit: Payer: Self-pay | Admitting: Medical Oncology

## 2013-04-30 ENCOUNTER — Encounter: Payer: Self-pay | Admitting: Medical Oncology

## 2013-04-30 MED ORDER — GABAPENTIN 600 MG PO TABS: 600.0000 mg | ORAL_TABLET | Freq: Two times a day (BID) | ORAL | Status: DC

## 2013-04-30 MED ORDER — GABAPENTIN 300 MG PO CAPS: 600.0000 mg | ORAL_CAPSULE | Freq: Two times a day (BID) | ORAL | Status: DC

## 2013-04-30 NOTE — Telephone Encounter (Signed)
RECEIVED A FAX FROM [facility] CONCERNING A CONFIRMATION OF PT.'S BENEFITS. PT.'S COPAY IS ZERO.

## 2013-05-12 ENCOUNTER — Other Ambulatory Visit: Payer: Self-pay | Admitting: Hematology and Oncology

## 2013-05-13 ENCOUNTER — Other Ambulatory Visit (HOSPITAL_BASED_OUTPATIENT_CLINIC_OR_DEPARTMENT_OTHER): Payer: Medicare Other

## 2013-05-13 DIAGNOSIS — C9000 Multiple myeloma not having achieved remission: Secondary | ICD-10-CM

## 2013-05-13 LAB — CBC WITH DIFFERENTIAL/PLATELET
BASO%: 0.4 % (ref 0.0–2.0)
Basophils Absolute: 0 10*3/uL (ref 0.0–0.1)
EOS%: 2.1 % (ref 0.0–7.0)
Eosinophils Absolute: 0.1 10*3/uL (ref 0.0–0.5)
HCT: 36.8 % (ref 34.8–46.6)
HGB: 11.6 g/dL (ref 11.6–15.9)
LYMPH%: 38.7 % (ref 14.0–49.7)
MCH: 31 pg (ref 25.1–34.0)
MCHC: 31.5 g/dL (ref 31.5–36.0)
MCV: 98.4 fL (ref 79.5–101.0)
MONO#: 0.7 10*3/uL (ref 0.1–0.9)
MONO%: 12.7 % (ref 0.0–14.0)
NEUT#: 2.4 10*3/uL (ref 1.5–6.5)
NEUT%: 46.1 % (ref 38.4–76.8)
Platelets: 175 10*3/uL (ref 145–400)
RBC: 3.74 10*6/uL (ref 3.70–5.45)
RDW: 14.7 % — ABNORMAL HIGH (ref 11.2–14.5)
WBC: 5.2 10*3/uL (ref 3.9–10.3)
lymph#: 2 10*3/uL (ref 0.9–3.3)

## 2013-05-18 ENCOUNTER — Other Ambulatory Visit: Payer: Self-pay | Admitting: Medical Oncology

## 2013-05-18 DIAGNOSIS — C9000 Multiple myeloma not having achieved remission: Secondary | ICD-10-CM

## 2013-05-18 MED ORDER — PANTOPRAZOLE SODIUM 40 MG PO TBEC: DELAYED_RELEASE_TABLET | ORAL | Status: DC

## 2013-05-19 ENCOUNTER — Other Ambulatory Visit: Payer: Self-pay | Admitting: *Deleted

## 2013-05-19 DIAGNOSIS — C9000 Multiple myeloma not having achieved remission: Secondary | ICD-10-CM

## 2013-05-19 MED ORDER — LENALIDOMIDE 10 MG PO CAPS
ORAL_CAPSULE | ORAL | Status: DC
Start: 2013-05-19 — End: 2013-06-07

## 2013-05-19 NOTE — Telephone Encounter (Signed)
Refill request for Revlimid to MD desk for signature. 

## 2013-05-19 NOTE — Addendum Note (Signed)
Addended by: Tania Ade on: 05/19/2013 11:25 AM   Modules accepted: Orders

## 2013-05-25 ENCOUNTER — Other Ambulatory Visit: Payer: Self-pay | Admitting: *Deleted

## 2013-05-25 DIAGNOSIS — C9000 Multiple myeloma not having achieved remission: Secondary | ICD-10-CM

## 2013-05-25 MED ORDER — WARFARIN SODIUM 6 MG PO TABS
6.0000 mg | ORAL_TABLET | Freq: Every day | ORAL | Status: DC
Start: 2013-05-25 — End: 2013-11-30

## 2013-06-04 ENCOUNTER — Other Ambulatory Visit: Payer: Self-pay | Admitting: *Deleted

## 2013-06-04 DIAGNOSIS — C9000 Multiple myeloma not having achieved remission: Secondary | ICD-10-CM

## 2013-06-04 NOTE — Telephone Encounter (Signed)
THIS REFILL REQUEST FOR REVLIMID WAS GIVEN TO DR.CHISM'S NURSE, ROBIN BASS,RN. 

## 2013-06-07 MED ORDER — LENALIDOMIDE 10 MG PO CAPS
ORAL_CAPSULE | ORAL | Status: DC
Start: 2013-06-07 — End: 2013-06-25

## 2013-06-07 NOTE — Addendum Note (Signed)
Addended by: Wyonia Hough on: 06/07/2013 10:36 AM   Modules accepted: Orders

## 2013-06-10 ENCOUNTER — Other Ambulatory Visit (HOSPITAL_BASED_OUTPATIENT_CLINIC_OR_DEPARTMENT_OTHER): Payer: Medicare Other

## 2013-06-10 DIAGNOSIS — C9000 Multiple myeloma not having achieved remission: Secondary | ICD-10-CM

## 2013-06-10 LAB — CBC WITH DIFFERENTIAL/PLATELET
BASO%: 1.9 % (ref 0.0–2.0)
Basophils Absolute: 0.1 10*3/uL (ref 0.0–0.1)
EOS%: 1.7 % (ref 0.0–7.0)
Eosinophils Absolute: 0.1 10*3/uL (ref 0.0–0.5)
HCT: 35.9 % (ref 34.8–46.6)
HGB: 11.4 g/dL — ABNORMAL LOW (ref 11.6–15.9)
LYMPH%: 40.4 % (ref 14.0–49.7)
MCH: 31.7 pg (ref 25.1–34.0)
MCHC: 31.8 g/dL (ref 31.5–36.0)
MCV: 99.7 fL (ref 79.5–101.0)
MONO#: 0.4 10*3/uL (ref 0.1–0.9)
MONO%: 9.4 % (ref 0.0–14.0)
NEUT#: 1.9 10*3/uL (ref 1.5–6.5)
NEUT%: 46.6 % (ref 38.4–76.8)
Platelets: 278 10*3/uL (ref 145–400)
RBC: 3.6 10*6/uL — ABNORMAL LOW (ref 3.70–5.45)
RDW: 14.3 % (ref 11.2–14.5)
WBC: 4.1 10*3/uL (ref 3.9–10.3)
lymph#: 1.7 10*3/uL (ref 0.9–3.3)
nRBC: 0 % (ref 0–0)

## 2013-06-24 ENCOUNTER — Other Ambulatory Visit: Payer: Self-pay | Admitting: *Deleted

## 2013-06-24 DIAGNOSIS — C9000 Multiple myeloma not having achieved remission: Secondary | ICD-10-CM

## 2013-06-24 NOTE — Telephone Encounter (Signed)
THIS REFILL REQUEST FOR REVLIMID WAS LEFT ON DR.CHISM'S NURSE'S DESK.

## 2013-06-25 MED ORDER — LENALIDOMIDE 10 MG PO CAPS: ORAL_CAPSULE | ORAL | Status: DC

## 2013-06-25 NOTE — Addendum Note (Signed)
Addended by: Wyonia Hough on: 06/25/2013 03:14 PM   Modules accepted: Orders

## 2013-06-28 NOTE — Telephone Encounter (Signed)
RECEIVED A FAX FROM [facility] CONCERNING A CONFIRMATION OF PT.'S PRESCRIPTION BENEFITS. PT.'S COPAY IS ZERO. 

## 2013-07-07 ENCOUNTER — Other Ambulatory Visit: Payer: Self-pay

## 2013-07-08 ENCOUNTER — Ambulatory Visit (HOSPITAL_BASED_OUTPATIENT_CLINIC_OR_DEPARTMENT_OTHER): Payer: Medicare Other

## 2013-07-08 ENCOUNTER — Ambulatory Visit (HOSPITAL_BASED_OUTPATIENT_CLINIC_OR_DEPARTMENT_OTHER): Payer: Medicare Other | Admitting: Internal Medicine

## 2013-07-08 ENCOUNTER — Other Ambulatory Visit: Payer: Self-pay | Admitting: Internal Medicine

## 2013-07-08 ENCOUNTER — Telehealth: Payer: Self-pay | Admitting: Internal Medicine

## 2013-07-08 ENCOUNTER — Other Ambulatory Visit (HOSPITAL_BASED_OUTPATIENT_CLINIC_OR_DEPARTMENT_OTHER): Payer: Medicare Other

## 2013-07-08 ENCOUNTER — Encounter: Payer: Self-pay | Admitting: Internal Medicine

## 2013-07-08 VITALS — BP 130/65 | HR 64 | Temp 97.9°F | Resp 18 | Ht 66.0 in | Wt 168.0 lb

## 2013-07-08 DIAGNOSIS — Z7901 Long term (current) use of anticoagulants: Secondary | ICD-10-CM

## 2013-07-08 DIAGNOSIS — IMO0002 Reserved for concepts with insufficient information to code with codable children: Secondary | ICD-10-CM

## 2013-07-08 DIAGNOSIS — C9000 Multiple myeloma not having achieved remission: Secondary | ICD-10-CM

## 2013-07-08 DIAGNOSIS — D649 Anemia, unspecified: Secondary | ICD-10-CM

## 2013-07-08 DIAGNOSIS — G622 Polyneuropathy due to other toxic agents: Secondary | ICD-10-CM

## 2013-07-08 DIAGNOSIS — I10 Essential (primary) hypertension: Secondary | ICD-10-CM

## 2013-07-08 DIAGNOSIS — T451X5A Adverse effect of antineoplastic and immunosuppressive drugs, initial encounter: Secondary | ICD-10-CM

## 2013-07-08 LAB — COMPREHENSIVE METABOLIC PANEL (CC13)
ALT: 11 U/L (ref 0–55)
AST: 15 U/L (ref 5–34)
Albumin: 3.7 g/dL (ref 3.5–5.0)
Alkaline Phosphatase: 67 U/L (ref 40–150)
Anion Gap: 6 mEq/L (ref 3–11)
BUN: 9.8 mg/dL (ref 7.0–26.0)
CO2: 27 mEq/L (ref 22–29)
Calcium: 9.3 mg/dL (ref 8.4–10.4)
Chloride: 110 mEq/L — ABNORMAL HIGH (ref 98–109)
Creatinine: 0.9 mg/dL (ref 0.6–1.1)
Glucose: 96 mg/dl (ref 70–140)
Potassium: 4 mEq/L (ref 3.5–5.1)
Sodium: 144 mEq/L (ref 136–145)
Total Bilirubin: 0.39 mg/dL (ref 0.20–1.20)
Total Protein: 7.3 g/dL (ref 6.4–8.3)

## 2013-07-08 LAB — CBC WITH DIFFERENTIAL/PLATELET
BASO%: 0.8 % (ref 0.0–2.0)
Basophils Absolute: 0 10*3/uL (ref 0.0–0.1)
EOS%: 4.1 % (ref 0.0–7.0)
Eosinophils Absolute: 0.1 10*3/uL (ref 0.0–0.5)
HCT: 34.9 % (ref 34.8–46.6)
HGB: 11 g/dL — ABNORMAL LOW (ref 11.6–15.9)
LYMPH%: 43.1 % (ref 14.0–49.7)
MCH: 32.2 pg (ref 25.1–34.0)
MCHC: 31.5 g/dL (ref 31.5–36.0)
MCV: 102.1 fL — ABNORMAL HIGH (ref 79.5–101.0)
MONO#: 0.2 10*3/uL (ref 0.1–0.9)
MONO%: 7 % (ref 0.0–14.0)
NEUT#: 1.4 10*3/uL — ABNORMAL LOW (ref 1.5–6.5)
NEUT%: 45 % (ref 38.4–76.8)
Platelets: 235 10*3/uL (ref 145–400)
RBC: 3.42 10*6/uL — ABNORMAL LOW (ref 3.70–5.45)
RDW: 16.5 % — ABNORMAL HIGH (ref 11.2–14.5)
WBC: 3.2 10*3/uL — ABNORMAL LOW (ref 3.9–10.3)
lymph#: 1.4 10*3/uL (ref 0.9–3.3)

## 2013-07-08 LAB — LACTATE DEHYDROGENASE (CC13): LDH: 174 U/L (ref 125–245)

## 2013-07-08 MED ORDER — SODIUM CHLORIDE 0.9 % IV SOLN
INTRAVENOUS | Status: DC
  Administered 2013-07-08: 12:00:00 via INTRAVENOUS

## 2013-07-08 MED ORDER — ZOLEDRONIC ACID 4 MG/100ML IV SOLN
4.0000 mg | Freq: Once | INTRAVENOUS | Status: AC
  Administered 2013-07-08: 4 mg via INTRAVENOUS
  Filled 2013-07-08: qty 100

## 2013-07-08 NOTE — Telephone Encounter (Signed)
Gave pt appt for labs every month per MD with MD visit

## 2013-07-08 NOTE — Progress Notes (Signed)
Discharged at 1230, ambulatory in no distress

## 2013-07-08 NOTE — Progress Notes (Signed)
[place] OFFICE PROGRESS NOTE  [name], MD [address] [place] [address]  DIAGNOSIS: Multiple myeloma  Peripheral neuropathy, secondary to drugs or chemicals  Chief Complaint  Patient presents with  . Follow-up    CURRENT THERAPY:-Revlimid 10 mg daily, 2 weeks on, 1 week off. Revlimid was started on 09/24/2010. Zometa 4 mg IV currently being given every 3 months. This was started on 09/28/2010 and initially was given monthly. Coumadin 6 mg daily to maintain an INR of approximately 1.5. This is being followed by the [facility].   IMMUNIZATIONS:  Pneumovax was given in July 2012.  Flu shot was given on February 12, 2011.  Inoculations following high-dose therapy and autologous stem cell infusion were given on May 05, 2011.  INTERVAL HISTORY: Carmen Fisher 73 y.o. female with a history of Multiple myeloma is here for follow-up. She was last seen by me on 04/15/2013.  Clinically, she continues to do well with no change in her condition.  She denies any side effects from the IV Zometa that she receives every 3 months.  She has no scheduled dental extractions.  She will make a follow up with dentistry annually.  She is on Revlimid 10 mg daily by mouth, 2 weeks out of every 3 weeks.  She is also followed by [facility] about once a year.  She continues to have grade 1 neuropathy symptoms but otherwise is without problems.  As previously reported, she was admitted to [facility] for AMS and fever secondary to complicated UTI sepsis due to E.Coli pyelonephritis and bacteremia before Christmas, 2014.  She completed a course of antibiotics and reports complete resolution.  She denies nausea/vomiting, abdominal, fevers/chills or acute shortness of breath.  She denies hematuria or dysuria. She also denies bone pain or night sweats or weight loss. She did see a urologist due to urinary urgency with a normal bladder ultrasound.  She denies a  recent hospitalization or emergency room visit.  She had a mammogram Dec. 2014 which was negative.   MEDICAL HISTORY: Past Medical History  Diagnosis Date  . Multiple myeloma, without mention of having achieved remission   . Anemia, unspecified   . Hypertension   . Peripheral neuropathy   . Dyslipidemia   . Glaucoma   . Multiple myeloma   . Chronic anticoagulation   . Glaucoma   . Gout   . Osteoporosis   . Hypokalemia   . GERD (gastroesophageal reflux disease)     INTERIM HISTORY: has Multiple myeloma; Peripheral neuropathy, secondary to drugs or chemicals; Multiple myeloma; Multiple myeloma; Chronic anticoagulation; UTI (lower urinary tract infection); Hypotension; Altered mental status; Dehydration; Leukopenia; ARF (acute renal failure); Elevated liver enzymes; Sepsis; and Neutropenic fever on her problem list.    ALLERGIES:  is allergic to aspirin; aspirin; penicillins; sulfa antibiotics; and sulfa antibiotics.  MEDICATIONS: has a current medication list which includes the following prescription(s): acetaminophen, allopurinol, magic mouthwash, cholecalciferol, cranberry, gabapentin, latanoprost, lenalidomide, loperamide, lorazepam, multivitamin with minerals, oxycodone, pantoprazole, potassium chloride sa, prochlorperazine, timolol, warfarin, and zolendronic acid.  SURGICAL HISTORY:  Past Surgical History  Procedure Laterality Date  . Appendectomy    . Abdominal hysterectomy  1994    w/BSO   PROBLEM LIST:  1. Multiple myeloma diagnosed in August 2010. She has kappa light chain disease. In addition, she was found to have monoclonal IgG as well in her urine back at the time of diagnosis. Bone marrow initially showed 50% plasma cells. Metastatic  bone survey carried out on March 30, 2009 was negative. Ms. Traxler was initially treated with Velcade, Decadron and Doxil for 4-1/2 cycles from 04/24/2009 through 07/27/2009. She developed peripheral neuropathy. Another bone marrow was  carried out on 08/03/2009,  showed 30% plasma cells. Revlimid and Decadron were given from 09/01/2009 through December 2011. Bone marrow on March 01, 2010 showed 5% plasma cells. 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 She has had an excellent response to that treatment. Since June 2012, she has been receiving maintenance Revlimid and Zometa. Serum immunofixation electrophoresis from 10/26/2010 was negative. Urine immunofixation electrophoresis from July 2012 continues to show monoclonal free kappa light chains. Total urine protein was 125 mg at that time. Back at the time of diagnosis in December 2010, the 24-hour urine protein was over 8 g. The patient's most recent metastatic bone survey carried out at [facility] on 02/07/2011 was Negative. A 24 hour urine collection from 02/06/2012 yielded a protein of 704 mg as compared with 218 mg on 07/30/2011, 125 mg on 04/19/2011 and 62 mg on 07/10/2010. The free kappa light chain excretion was 123.5 mg suggesting that most of the protein is not the kappa light chain. The urine kappa to lambda ratio was 31.88 and the urine immunofixation electrophoresis was positive for monoclonal kappa light  chains. Urine protein to creatinine ratio was 0.04. Serum immunofixation electrophoresis from 02/06/2012 showed no monoclonal protein. IgG level was 1530, IgA level 187 and IgM level 32, the latter being low. Metastatic bone survey from 02/06/2012 showed no evidence for metastatic disease. A 24-hour urine collection from 04/30/2012 yielded 342 mg of protein, of which 304 mg were monoclonal kappa light chains. Urine immunofixation electrophoresis was positive for monoclonal free kappa light chains.  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 Symptoms developed in April 2011. The patient is currently on Neurontin.  3. Hypertension.  4. GERD.  5. Dyslipidemia.  6. Glaucoma. 7. Admitted to ICU with E.coli bacteremia  secondary to pyelonephritis (11/2012).  REVIEW OF SYSTEMS:   Constitutional: Denies fevers, chills or abnormal weight loss Eyes: Denies blurriness of vision Ears, nose, mouth, throat, and face: Denies mucositis or sore throat Respiratory: Denies cough, dyspnea or wheezes Cardiovascular: Denies palpitation, chest discomfort or lower extremity swelling Gastrointestinal:  Denies nausea, heartburn or change in bowel habits Skin: Denies abnormal skin rashes Lymphatics: Denies new lymphadenopathy or easy bruising Neurological:Denies numbness, tingling or new weaknesses Behavioral/Psych: Mood is stable, no new changes  All other systems were reviewed with the patient and are negative.  PHYSICAL EXAMINATION: ECOG PERFORMANCE STATUS: 0 - Asymptomatic  Blood pressure 130/65, pulse 64, temperature 97.9 F (36.6 C), temperature source Oral, resp. rate 18, height 5' 6"  (1.676 m), weight 168 lb (76.204 kg).  GENERAL:alert, no distress and comfortable; well developed and well nourished.  SKIN: skin color, texture, turgor are normal, no rashes or significant lesions EYES: normal, Conjunctiva are pink and non-injected, sclera clear OROPHARYNX:no exudate, no erythema and lips, buccal mucosa, and tongue normal  NECK: supple, thyroid normal size, non-tender, without nodularity LYMPH:  no palpable lymphadenopathy in the cervical, axillary or supraclavicular LUNGS: clear to auscultation and percussion with normal breathing effort HEART: regular rate & rhythm and SEM and no lower extremity edema ABDOMEN:abdomen soft, non-tender and normal bowel sounds Musculoskeletal:no cyanosis of digits and no clubbing  NEURO: alert & oriented x 3 with fluent speech, no focal motor/sensory deficits  Labs:  Lab Results  Component Value Date  WBC 3.2* 07/08/2013   HGB 11.0* 07/08/2013   HCT 34.9 07/08/2013   MCV 102.1* 07/08/2013   PLT 235 07/08/2013   NEUTROABS 1.4* 07/08/2013      Chemistry      Component Value  Date/Time   NA 144 07/08/2013 0929   NA 142 12/08/2012 0640   K 4.0 07/08/2013 0929   K 3.0* 12/08/2012 0640   CL 109 12/08/2012 0640   CL 106 07/28/2012 0924   CO2 27 07/08/2013 0929   CO2 23 12/08/2012 0640   BUN 9.8 07/08/2013 0929   BUN 11 12/08/2012 0640   CREATININE 0.9 07/08/2013 0929   CREATININE 0.83 12/08/2012 0640   CREATININE 0.93 10/01/2010 0836      Component Value Date/Time   CALCIUM 9.3 07/08/2013 0929   CALCIUM 7.8* 12/08/2012 0640   ALKPHOS 67 07/08/2013 0929   ALKPHOS 68 12/08/2012 0640   AST 15 07/08/2013 0929   AST 47* 12/08/2012 0640   ALT 11 07/08/2013 0929   ALT 52* 12/08/2012 0640   BILITOT 0.39 07/08/2013 0929   BILITOT 0.4 12/08/2012 0640     GFR Estimated Creatinine Clearance: 60.7 ml/min (by C-G formula based on Cr of 0.9).   RADIOGRAPHIC STUDIES: 1. Chest x-ray, 2 view, from 03/21/2010 showed a tortuous aorta and scoliosis, otherwise negative.  2. Metastatic bone survey was carried out at [facility] on 02/07/2011. This was negative. The report has been scanned into the Epic system.  3. Metastatic bone survey from 02/06/2012 showed no evidence for metastatic disease. There were no lytic lesions suggestive of multiple myeloma. 4. Metastatic bone survey from 02/15/2013 showed no evidence for metastatic disease. There were no lytic lesions suggestive of multiple myeloma.  ASSESSMENT: Carmen Fisher 73 y.o. female with a history of Multiple myeloma  Peripheral neuropathy, secondary to drugs or chemicals  PLAN:  1. 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 --Ms. Fetting continues to do well, now approaching over 4 years from the time of diagnosis, which apparently was made in December 2010, when a bone marrow at that time showed 50% plasma cells. The patient is more than 2 years out from the time of high-dose melphalan and autologous stem cell transplant.   --Ms. Carboni is due for Zometa today. She receives this every 3 months. Her calcium is within normal  limits.  She denies any dental procedures or  Problems.   --She will require her annual bone survey for her multiple myeloma in November.  She will continue with Revlimid 10 mg daily, 2 weeks out of every 3. She will continue on the Coumadin to maintain an INR somewhere around 1.5 since she is allergic to ASA.  --Her M spike is not detected.  IgG is 1470.Marland Kitchen  Her kappa:lambda ratio is 2.16.  Her creatinine is within normal limits.    2. 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 --Her symptoms of numbness and tingling is stable.  She is able to complete her daily task including buttoning her shirts and ambulation without falls without difficulty.   3. Anemia.  --Her hemoglobin is 11.0 today.  Likely secondary to chronic disease.  She is asymptomatic presently.    4. Follow-up. The patient was seen by Dr. Alvie Heidelberg in May 2015. We have asked Ms. Jaroszewski to return to see [place] in about 3 months, CBC, Chemistries and SPEP plus quantitative immunoglobins, kappa/lambda light chains.   All questions were answered. The patient knows to call the clinic with any  problems, questions or concerns. We can certainly see the patient much sooner if necessary.  I spent 15 minutes counseling the patient face to face. The total time spent in the appointment was 25 minutes.    Concha Norway, MD 07/08/2013 10:45 AM

## 2013-07-08 NOTE — Patient Instructions (Signed)

## 2013-07-12 LAB — KAPPA/LAMBDA LIGHT CHAINS
Kappa free light chain: 6.48 mg/dL — ABNORMAL HIGH (ref 0.33–1.94)
Kappa:Lambda Ratio: 2.05 — ABNORMAL HIGH (ref 0.26–1.65)
Lambda Free Lght Chn: 3.16 mg/dL — ABNORMAL HIGH (ref 0.57–2.63)

## 2013-07-12 LAB — SPEP & IFE WITH QIG
Albumin ELP: 51.8 % — ABNORMAL LOW (ref 55.8–66.1)
Alpha-1-Globulin: 6.5 % — ABNORMAL HIGH (ref 2.9–4.9)
Alpha-2-Globulin: 10.5 % (ref 7.1–11.8)
Beta 2: 5 % (ref 3.2–6.5)
Beta Globulin: 6.6 % (ref 4.7–7.2)
Gamma Globulin: 19.6 % — ABNORMAL HIGH (ref 11.1–18.8)
IgA: 434 mg/dL — ABNORMAL HIGH (ref 69–380)
IgG (Immunoglobin G), Serum: 1400 mg/dL (ref 690–1700)
IgM, Serum: 30 mg/dL — ABNORMAL LOW (ref 52–322)
Total Protein, Serum Electrophoresis: 7.2 g/dL (ref 6.0–8.3)

## 2013-07-13 ENCOUNTER — Other Ambulatory Visit: Payer: Self-pay | Admitting: *Deleted

## 2013-07-13 ENCOUNTER — Telehealth: Payer: Self-pay | Admitting: Internal Medicine

## 2013-07-13 NOTE — Telephone Encounter (Signed)
THIS REFILL REQUEST FOR REVLIMID WAS GIVEN TO DR.CHISM'S NURSE, ROBIN BASS,RN. 

## 2013-07-13 NOTE — Telephone Encounter (Signed)
zometa added to 7/9 appt after f/u per 2nd 4/9 pof. start time remains the same. message to desk nurse re if labs are mthly or just in 36mos and if pt should rtr for inr check.

## 2013-07-14 ENCOUNTER — Encounter: Payer: Self-pay | Admitting: Medical Oncology

## 2013-07-14 ENCOUNTER — Other Ambulatory Visit: Payer: Self-pay | Admitting: *Deleted

## 2013-07-14 DIAGNOSIS — C9000 Multiple myeloma not having achieved remission: Secondary | ICD-10-CM

## 2013-07-14 MED ORDER — LENALIDOMIDE 10 MG PO CAPS: ORAL_CAPSULE | ORAL | Status: DC

## 2013-07-30 ENCOUNTER — Other Ambulatory Visit: Payer: Self-pay | Admitting: Internal Medicine

## 2013-08-02 ENCOUNTER — Other Ambulatory Visit: Payer: Self-pay

## 2013-08-02 ENCOUNTER — Telehealth: Payer: Self-pay

## 2013-08-02 DIAGNOSIS — C9000 Multiple myeloma not having achieved remission: Secondary | ICD-10-CM

## 2013-08-02 MED ORDER — POTASSIUM CHLORIDE CRYS ER 20 MEQ PO TBCR: 30.0000 meq | EXTENDED_RELEASE_TABLET | Freq: Every day | ORAL | Status: DC

## 2013-08-02 NOTE — Telephone Encounter (Signed)
Returning pt call about medication refill. lvm for pt to call back with name of medication needed.

## 2013-08-03 ENCOUNTER — Other Ambulatory Visit: Payer: Self-pay | Admitting: *Deleted

## 2013-08-03 NOTE — Telephone Encounter (Signed)
THIS REFILL REQUEST FOR REVLIMID WAS GIVEN TO DR.CHISM'S NURSE, Lennart Pall.

## 2013-08-04 NOTE — Telephone Encounter (Signed)
[facility] faxed confirmation of facsimile receipt for Revlimid prescription referral.  [facility] will verify insurance and make delivery arrangements with patient.

## 2013-08-09 ENCOUNTER — Other Ambulatory Visit (HOSPITAL_BASED_OUTPATIENT_CLINIC_OR_DEPARTMENT_OTHER): Payer: Medicare Other

## 2013-08-09 DIAGNOSIS — Z7901 Long term (current) use of anticoagulants: Secondary | ICD-10-CM

## 2013-08-09 DIAGNOSIS — C9000 Multiple myeloma not having achieved remission: Secondary | ICD-10-CM

## 2013-08-09 DIAGNOSIS — D649 Anemia, unspecified: Secondary | ICD-10-CM

## 2013-08-09 LAB — CBC WITH DIFFERENTIAL/PLATELET
BASO%: 0.8 % (ref 0.0–2.0)
Basophils Absolute: 0 10*3/uL (ref 0.0–0.1)
EOS%: 1 % (ref 0.0–7.0)
Eosinophils Absolute: 0 10*3/uL (ref 0.0–0.5)
HCT: 36.4 % (ref 34.8–46.6)
HGB: 11.9 g/dL (ref 11.6–15.9)
LYMPH%: 34.3 % (ref 14.0–49.7)
MCH: 32.4 pg (ref 25.1–34.0)
MCHC: 32.7 g/dL (ref 31.5–36.0)
MCV: 99.2 fL (ref 79.5–101.0)
MONO#: 0.5 10*3/uL (ref 0.1–0.9)
MONO%: 13.6 % (ref 0.0–14.0)
NEUT#: 2 10*3/uL (ref 1.5–6.5)
NEUT%: 50.3 % (ref 38.4–76.8)
Platelets: 141 10*3/uL — ABNORMAL LOW (ref 145–400)
RBC: 3.67 10*6/uL — ABNORMAL LOW (ref 3.70–5.45)
RDW: 14.6 % — ABNORMAL HIGH (ref 11.2–14.5)
WBC: 3.9 10*3/uL (ref 3.9–10.3)
lymph#: 1.3 10*3/uL (ref 0.9–3.3)
nRBC: 0 % (ref 0–0)

## 2013-08-09 LAB — PROTIME-INR
INR: 1.4 — ABNORMAL LOW (ref 2.00–3.50)
Protime: 16.8 Seconds — ABNORMAL HIGH (ref 10.6–13.4)

## 2013-08-20 ENCOUNTER — Other Ambulatory Visit: Payer: Self-pay | Admitting: *Deleted

## 2013-08-20 DIAGNOSIS — C9000 Multiple myeloma not having achieved remission: Secondary | ICD-10-CM

## 2013-08-20 MED ORDER — LENALIDOMIDE 10 MG PO CAPS: ORAL_CAPSULE | ORAL | Status: DC

## 2013-08-20 NOTE — Telephone Encounter (Signed)
THIS REFILL REQUEST FOR REVLIMID WAS GIVEN TO DR.CHISM'S NURSE, ROBIN BASS,RN. 

## 2013-08-20 NOTE — Addendum Note (Signed)
Addended by: Wyonia Hough on: 08/20/2013 04:41 PM   Modules accepted: Orders

## 2013-08-30 ENCOUNTER — Other Ambulatory Visit: Payer: Self-pay | Admitting: Internal Medicine

## 2013-09-08 ENCOUNTER — Other Ambulatory Visit: Payer: Self-pay | Admitting: Internal Medicine

## 2013-09-10 ENCOUNTER — Other Ambulatory Visit: Payer: Self-pay | Admitting: *Deleted

## 2013-09-10 DIAGNOSIS — C9000 Multiple myeloma not having achieved remission: Secondary | ICD-10-CM

## 2013-09-10 MED ORDER — LENALIDOMIDE 10 MG PO CAPS: ORAL_CAPSULE | ORAL | Status: DC

## 2013-09-10 NOTE — Telephone Encounter (Signed)
THIS REFILL REQUEST FOR REVLIMID WAS GIVEN TO DR.CHISM'S NURSE, ROBIN BASS,RN. 

## 2013-09-10 NOTE — Addendum Note (Signed)
Addended by: Wyonia Hough on: 09/10/2013 02:14 PM   Modules accepted: Orders

## 2013-09-13 ENCOUNTER — Other Ambulatory Visit (HOSPITAL_BASED_OUTPATIENT_CLINIC_OR_DEPARTMENT_OTHER): Payer: Medicare Other

## 2013-09-13 DIAGNOSIS — C9000 Multiple myeloma not having achieved remission: Secondary | ICD-10-CM

## 2013-09-13 DIAGNOSIS — Z7901 Long term (current) use of anticoagulants: Secondary | ICD-10-CM

## 2013-09-13 LAB — CBC WITH DIFFERENTIAL/PLATELET
BASO%: 0.4 % (ref 0.0–2.0)
Basophils Absolute: 0 10*3/uL (ref 0.0–0.1)
EOS%: 2.1 % (ref 0.0–7.0)
Eosinophils Absolute: 0.1 10*3/uL (ref 0.0–0.5)
HCT: 36.9 % (ref 34.8–46.6)
HGB: 11.9 g/dL (ref 11.6–15.9)
LYMPH%: 29.3 % (ref 14.0–49.7)
MCH: 31.7 pg (ref 25.1–34.0)
MCHC: 32.2 g/dL (ref 31.5–36.0)
MCV: 98.4 fL (ref 79.5–101.0)
MONO#: 0.6 10*3/uL (ref 0.1–0.9)
MONO%: 11.3 % (ref 0.0–14.0)
NEUT#: 2.9 10*3/uL (ref 1.5–6.5)
NEUT%: 56.9 % (ref 38.4–76.8)
Platelets: 173 10*3/uL (ref 145–400)
RBC: 3.75 10*6/uL (ref 3.70–5.45)
RDW: 14.4 % (ref 11.2–14.5)
WBC: 5.2 10*3/uL (ref 3.9–10.3)
lymph#: 1.5 10*3/uL (ref 0.9–3.3)
nRBC: 0 % (ref 0–0)

## 2013-09-13 LAB — PROTIME-INR
INR: 2.1 (ref 2.00–3.50)
Protime: 25.2 Seconds — ABNORMAL HIGH (ref 10.6–13.4)

## 2013-09-14 NOTE — Telephone Encounter (Signed)
RECEIVED A FAX FROM [facility] CONCERNING A CONFIRMATION OF PRESCRIPTION SHIPMENT FOR REVLIMID ON 09/14/13,

## 2013-09-28 ENCOUNTER — Other Ambulatory Visit: Payer: Self-pay | Admitting: Internal Medicine

## 2013-10-07 ENCOUNTER — Ambulatory Visit (HOSPITAL_BASED_OUTPATIENT_CLINIC_OR_DEPARTMENT_OTHER): Payer: Medicare Other | Admitting: Internal Medicine

## 2013-10-07 ENCOUNTER — Other Ambulatory Visit (HOSPITAL_BASED_OUTPATIENT_CLINIC_OR_DEPARTMENT_OTHER): Payer: Medicare Other

## 2013-10-07 ENCOUNTER — Telehealth: Payer: Self-pay | Admitting: Internal Medicine

## 2013-10-07 ENCOUNTER — Encounter: Payer: Self-pay | Admitting: Internal Medicine

## 2013-10-07 ENCOUNTER — Telehealth: Payer: Self-pay | Admitting: *Deleted

## 2013-10-07 ENCOUNTER — Ambulatory Visit (HOSPITAL_BASED_OUTPATIENT_CLINIC_OR_DEPARTMENT_OTHER): Payer: Medicare Other

## 2013-10-07 VITALS — BP 135/67 | HR 65 | Temp 97.6°F | Resp 20 | Ht 66.0 in | Wt 169.9 lb

## 2013-10-07 DIAGNOSIS — T451X5A Adverse effect of antineoplastic and immunosuppressive drugs, initial encounter: Secondary | ICD-10-CM

## 2013-10-07 DIAGNOSIS — Z7901 Long term (current) use of anticoagulants: Secondary | ICD-10-CM

## 2013-10-07 DIAGNOSIS — C9000 Multiple myeloma not having achieved remission: Secondary | ICD-10-CM

## 2013-10-07 DIAGNOSIS — G622 Polyneuropathy due to other toxic agents: Secondary | ICD-10-CM

## 2013-10-07 DIAGNOSIS — IMO0002 Reserved for concepts with insufficient information to code with codable children: Secondary | ICD-10-CM

## 2013-10-07 DIAGNOSIS — D649 Anemia, unspecified: Secondary | ICD-10-CM

## 2013-10-07 LAB — CBC WITH DIFFERENTIAL/PLATELET
BASO%: 0.4 % (ref 0.0–2.0)
Basophils Absolute: 0 10*3/uL (ref 0.0–0.1)
EOS%: 1.3 % (ref 0.0–7.0)
Eosinophils Absolute: 0.1 10*3/uL (ref 0.0–0.5)
HCT: 35.5 % (ref 34.8–46.6)
HGB: 11.2 g/dL — ABNORMAL LOW (ref 11.6–15.9)
LYMPH%: 39.1 % (ref 14.0–49.7)
MCH: 31.6 pg (ref 25.1–34.0)
MCHC: 31.5 g/dL (ref 31.5–36.0)
MCV: 100.3 fL (ref 79.5–101.0)
MONO#: 0.6 10*3/uL (ref 0.1–0.9)
MONO%: 11.9 % (ref 0.0–14.0)
NEUT#: 2.3 10*3/uL (ref 1.5–6.5)
NEUT%: 47.3 % (ref 38.4–76.8)
Platelets: 171 10*3/uL (ref 145–400)
RBC: 3.54 10*6/uL — ABNORMAL LOW (ref 3.70–5.45)
RDW: 14.4 % (ref 11.2–14.5)
WBC: 4.8 10*3/uL (ref 3.9–10.3)
lymph#: 1.9 10*3/uL (ref 0.9–3.3)

## 2013-10-07 LAB — COMPREHENSIVE METABOLIC PANEL (CC13)
ALT: 21 U/L (ref 0–55)
AST: 18 U/L (ref 5–34)
Albumin: 3.5 g/dL (ref 3.5–5.0)
Alkaline Phosphatase: 72 U/L (ref 40–150)
Anion Gap: 7 mEq/L (ref 3–11)
BUN: 15.5 mg/dL (ref 7.0–26.0)
CO2: 29 mEq/L (ref 22–29)
Calcium: 9.8 mg/dL (ref 8.4–10.4)
Chloride: 105 mEq/L (ref 98–109)
Creatinine: 0.9 mg/dL (ref 0.6–1.1)
Glucose: 102 mg/dl (ref 70–140)
Potassium: 4.2 mEq/L (ref 3.5–5.1)
Sodium: 141 mEq/L (ref 136–145)
Total Bilirubin: 0.34 mg/dL (ref 0.20–1.20)
Total Protein: 7.6 g/dL (ref 6.4–8.3)

## 2013-10-07 LAB — LACTATE DEHYDROGENASE (CC13): LDH: 184 U/L (ref 125–245)

## 2013-10-07 LAB — PROTIME-INR
INR: 1.8 — ABNORMAL LOW (ref 2.00–3.50)
Protime: 21.6 Seconds — ABNORMAL HIGH (ref 10.6–13.4)

## 2013-10-07 MED ORDER — ZOLEDRONIC ACID 4 MG/100ML IV SOLN
4.0000 mg | Freq: Once | INTRAVENOUS | Status: AC
  Administered 2013-10-07: 4 mg via INTRAVENOUS
  Filled 2013-10-07: qty 100

## 2013-10-07 NOTE — Telephone Encounter (Signed)
Per staff message and POF I have scheduled appts. Advised scheduler of appts. JMW  

## 2013-10-07 NOTE — Telephone Encounter (Signed)
Gave pt appt for lab and MD on october , emailed michelle regarding chemo, notified coumadin clinic regarding labs, Sarajane Jews will get with Cira Rue regarding labs every month

## 2013-10-07 NOTE — Progress Notes (Signed)
[place] OFFICE PROGRESS NOTE  [name], MD [address] [place] [address]  DIAGNOSIS: Multiple myeloma - Plan: CBC with Differential, Comprehensive metabolic panel (Cmet) - [facility], Lactate dehydrogenase (LDH) - [facility], Kappa/lambda light chains, SPEP & IFE with QIG  Peripheral neuropathy, secondary to drugs or chemicals  Chief Complaint  Patient presents with  . Multiple Myeloma    CURRENT THERAPY:-Revlimid 10 mg daily, 2 weeks on, 1 week off. Revlimid was started on 09/24/2010. Zometa 4 mg IV currently being given every 3 months. This was started on 09/28/2010 and initially was given monthly. Coumadin 6 mg daily to maintain an INR of approximately 1.5. This is being followed by the [facility]madin Clinic.   IMMUNIZATIONS:  Pneumovax was given in July 2012.  Flu shot was given on February 12, 2011.  Inoculations following high-dose therapy and autologous stem cell infusion were given on May 05, 2011.  INTERVAL HISTORY: KEYANNA SANDEFER 73 y.o. female with a history of Multiple myeloma is here for follow-up. She was last seen by me on 07/08/2013.  Clinically, she continues to do well with no change in her condition.  She denies any side effects from the IV Zometa that she receives every 3 months.  She has no scheduled dental extractions.  She will make a follow up with dentistry annually.  She is on Revlimid 10 mg daily by mouth, 2 weeks out of every 3 weeks.  She is also followed by [facility] about once a year with her last visit this past May with recommendations to continue her revlimid.  She continues to have grade 1 neuropathy symptoms but otherwise is without problems.  Today, she denies nausea/vomiting, abdominal, fevers/chills or acute shortness of breath.  She denies hematuria or dysuria. She also denies bone pain or night sweats or weight loss. She did see a urologist due to urinary urgency with a normal bladder ultrasound.  She denies a recent  hospitalization or emergency room visit.  She had a mammogram Dec. 2014 which was negative.   MEDICAL HISTORY: Past Medical History  Diagnosis Date  . Multiple myeloma, without mention of having achieved remission   . Anemia, unspecified   . Hypertension   . Peripheral neuropathy   . Dyslipidemia   . Glaucoma   . Multiple myeloma   . Chronic anticoagulation   . Glaucoma   . Gout   . Osteoporosis   . Hypokalemia   . GERD (gastroesophageal reflux disease)     INTERIM HISTORY: has Multiple myeloma; Peripheral neuropathy, secondary to drugs or chemicals; Multiple myeloma; Multiple myeloma; Chronic anticoagulation; UTI (lower urinary tract infection); Hypotension; Altered mental status; Dehydration; Leukopenia; ARF (acute renal failure); Elevated liver enzymes; Sepsis; and Neutropenic fever on her problem list.    ALLERGIES:  is allergic to aspirin; aspirin; penicillins; sulfa antibiotics; and sulfa antibiotics.  MEDICATIONS: has a current medication list which includes the following prescription(s): acetaminophen, allopurinol, magic mouthwash, cholecalciferol, cranberry, gabapentin, latanoprost, lenalidomide, loperamide, lorazepam, multivitamin with minerals, oxycodone, pantoprazole, potassium chloride sa, prochlorperazine, timolol, warfarin, and zolendronic acid.  SURGICAL HISTORY:  Past Surgical History  Procedure Laterality Date  . Appendectomy    . Abdominal hysterectomy  1994    w/BSO   PROBLEM LIST:  1. Multiple myeloma diagnosed in August 2010. She has kappa light chain disease. In addition, she was found to have monoclonal IgG as well in her urine back at the time of diagnosis. Bone marrow initially showed 50% plasma cells. Metastatic bone  survey carried out on March 30, 2009 was negative. Ms. Brazzel was initially treated with Velcade, Decadron and Doxil for 4-1/2 cycles from 04/24/2009 through 07/27/2009. She developed peripheral neuropathy. Another bone marrow was carried  out on 08/03/2009,  showed 30% plasma cells. Revlimid and Decadron were given from 09/01/2009 through December 2011. Bone marrow on March 01, 2010 showed 5% plasma cells. 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 She has had an excellent response to that treatment. Since June 2012, she has been receiving maintenance Revlimid and Zometa. Serum immunofixation electrophoresis from 10/26/2010 was negative. Urine immunofixation electrophoresis from July 2012 continues to show monoclonal free kappa light chains. Total urine protein was 125 mg at that time. Back at the time of diagnosis in December 2010, the 24-hour urine protein was over 8 g. The patient's most recent metastatic bone survey carried out at [facility] on 02/07/2011 was Negative. A 24 hour urine collection from 02/06/2012 yielded a protein of 704 mg as compared with 218 mg on 07/30/2011, 125 mg on 04/19/2011 and 62 mg on 07/10/2010. The free kappa light chain excretion was 123.5 mg suggesting that most of the protein is not the kappa light chain. The urine kappa to lambda ratio was 31.88 and the urine immunofixation electrophoresis was positive for monoclonal kappa light  chains. Urine protein to creatinine ratio was 0.04. Serum immunofixation electrophoresis from 02/06/2012 showed no monoclonal protein. IgG level was 1530, IgA level 187 and IgM level 32, the latter being low. Metastatic bone survey from 02/06/2012 showed no evidence for metastatic disease. A 24-hour urine collection from 04/30/2012 yielded 342 mg of protein, of which 304 mg were monoclonal kappa light chains. Urine immunofixation electrophoresis was positive for monoclonal free kappa light chains.  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 Symptoms developed in April 2011. The patient is currently on Neurontin.  3. Hypertension.  4. GERD.  5. Dyslipidemia.  6. Glaucoma. 7. Admitted to ICU with E.coli bacteremia  secondary to pyelonephritis (11/2012).  REVIEW OF SYSTEMS:   Constitutional: Denies fevers, chills or abnormal weight loss Eyes: Denies blurriness of vision Ears, nose, mouth, throat, and face: Denies mucositis or sore throat Respiratory: Denies cough, dyspnea or wheezes Cardiovascular: Denies palpitation, chest discomfort or lower extremity swelling Gastrointestinal:  Denies nausea, heartburn or change in bowel habits Skin: Denies abnormal skin rashes Lymphatics: Denies new lymphadenopathy or easy bruising Neurological:Denies numbness, tingling or new weaknesses Behavioral/Psych: Mood is stable, no new changes  All other systems were reviewed with the patient and are negative.  PHYSICAL EXAMINATION: ECOG PERFORMANCE STATUS: 0 - Asymptomatic  Blood pressure 135/67, pulse 65, temperature 97.6 F (36.4 C), resp. rate 20, height 5' 6" (1.676 m), weight 169 lb 14.4 oz (77.066 kg), SpO2 100.00%.  GENERAL:alert, no distress and comfortable; well developed and well nourished.  SKIN: skin color, texture, turgor are normal, no rashes or significant lesions EYES: normal, Conjunctiva are pink and non-injected, sclera clear OROPHARYNX:no exudate, no erythema and lips, buccal mucosa, and tongue normal  NECK: supple, thyroid normal size, non-tender, without nodularity LYMPH:  no palpable lymphadenopathy in the cervical, axillary or supraclavicular LUNGS: clear to auscultation and percussion with normal breathing effort HEART: regular rate & rhythm and SEM and no lower extremity edema ABDOMEN:abdomen soft, non-tender and normal bowel sounds Musculoskeletal:no cyanosis of digits and no clubbing  NEURO: alert & oriented x 3 with fluent speech, no focal motor/sensory deficits  Labs:  Lab Results  Component Value Date  WBC 4.8 10/07/2013   HGB 11.2* 10/07/2013   HCT 35.5 10/07/2013   MCV 100.3 10/07/2013   PLT 171 10/07/2013   NEUTROABS 2.3 10/07/2013      Chemistry      Component Value Date/Time    NA 141 10/07/2013 0936   NA 142 12/08/2012 0640   K 4.2 10/07/2013 0936   K 3.0* 12/08/2012 0640   CL 109 12/08/2012 0640   CL 106 07/28/2012 0924   CO2 29 10/07/2013 0936   CO2 23 12/08/2012 0640   BUN 15.5 10/07/2013 0936   BUN 11 12/08/2012 0640   CREATININE 0.9 10/07/2013 0936   CREATININE 0.83 12/08/2012 0640   CREATININE 0.93 10/01/2010 0836      Component Value Date/Time   CALCIUM 9.8 10/07/2013 0936   CALCIUM 7.8* 12/08/2012 0640   ALKPHOS 72 10/07/2013 0936   ALKPHOS 68 12/08/2012 0640   AST 18 10/07/2013 0936   AST 47* 12/08/2012 0640   ALT 21 10/07/2013 0936   ALT 52* 12/08/2012 0640   BILITOT 0.34 10/07/2013 0936   BILITOT 0.4 12/08/2012 0640     GFR Estimated Creatinine Clearance: 60.1 ml/min (by C-G formula based on Cr of 0.9).   RADIOGRAPHIC STUDIES: 1. Chest x-ray, 2 view, from 03/21/2010 showed a tortuous aorta and scoliosis, otherwise negative.  2. Metastatic bone survey was carried out at [facility] on 02/07/2011. This was negative. The report has been scanned into the Epic system.  3. Metastatic bone survey from 02/06/2012 showed no evidence for metastatic disease. There were no lytic lesions suggestive of multiple myeloma. 4. Metastatic bone survey from 02/15/2013 showed no evidence for metastatic disease. There were no lytic lesions suggestive of multiple myeloma.  ASSESSMENT: ANAIJA WISSINK 73 y.o. female with a history of Multiple myeloma - Plan: CBC with Differential, Comprehensive metabolic panel (Cmet) - [facility], Lactate dehydrogenase (LDH) - [facility], Kappa/lambda light chains, SPEP & IFE with QIG  Peripheral neuropathy, secondary to drugs or chemicals  PLAN:  1. 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 --Ms. Chanda continues to do well, now approaching over 4 years from the time of diagnosis, which apparently was made in December 2010, when a bone marrow at that time showed 50% plasma cells. The patient is more than 2 years out from the time of high-dose melphalan  and autologous stem cell transplant.   --Ms. Pint is due for Zometa today. She receives this every 3 months. Her calcium is within normal limits.  She denies any dental procedures or dental problems.   --She will require her annual bone survey for her multiple myeloma in November.  She will continue with Revlimid 10 mg daily, 2 weeks out of every 3. She will continue on the Coumadin to maintain an INR somewhere around 1.5 since she is allergic to ASA.  --Her M spike is not detected from her last labs.  IgG is 1400.  Her kappa:lambda ratio is 2.05.  Her creatinine is within normal limits.    2. 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 --Her symptoms of numbness and tingling is stable.  She is able to complete her daily task including buttoning her shirts and ambulation without falls without difficulty.   3. Anemia.  --Her hemoglobin is 11.2 today.  Likely secondary to chronic disease.  She is asymptomatic presently.    4. Follow-up. The patient was seen by Dr. Alvie Heidelberg in May 2016. We have asked Ms. Tonkinson to return to see [place] in  about 3 months, CBC, Chemistries and SPEP plus quantitative immunoglobins, kappa/lambda light chains.   All questions were answered. The patient knows to call the clinic with any problems, questions or concerns. We can certainly see the patient much sooner if necessary.  I spent 15 minutes counseling the patient face to face. The total time spent in the appointment was 25 minutes.    Chrissa Meetze, MD 10/07/2013 1:20 PM

## 2013-10-07 NOTE — Patient Instructions (Signed)

## 2013-10-08 ENCOUNTER — Other Ambulatory Visit: Payer: Self-pay

## 2013-10-08 DIAGNOSIS — C9000 Multiple myeloma not having achieved remission: Secondary | ICD-10-CM

## 2013-10-08 MED ORDER — LENALIDOMIDE 10 MG PO CAPS: ORAL_CAPSULE | ORAL | Status: DC

## 2013-10-11 LAB — SPEP & IFE WITH QIG
Albumin ELP: 49.5 % — ABNORMAL LOW (ref 55.8–66.1)
Alpha-1-Globulin: 6.8 % — ABNORMAL HIGH (ref 2.9–4.9)
Alpha-2-Globulin: 11.8 % (ref 7.1–11.8)
Beta 2: 5.2 % (ref 3.2–6.5)
Beta Globulin: 6.5 % (ref 4.7–7.2)
Gamma Globulin: 20.2 % — ABNORMAL HIGH (ref 11.1–18.8)
IgA: 349 mg/dL (ref 69–380)
IgG (Immunoglobin G), Serum: 1460 mg/dL (ref 690–1700)
IgM, Serum: 33 mg/dL — ABNORMAL LOW (ref 52–322)
Total Protein, Serum Electrophoresis: 7.6 g/dL (ref 6.0–8.3)

## 2013-10-11 LAB — KAPPA/LAMBDA LIGHT CHAINS
Kappa free light chain: 5.14 mg/dL — ABNORMAL HIGH (ref 0.33–1.94)
Kappa:Lambda Ratio: 1.88 — ABNORMAL HIGH (ref 0.26–1.65)
Lambda Free Lght Chn: 2.73 mg/dL — ABNORMAL HIGH (ref 0.57–2.63)

## 2013-10-12 ENCOUNTER — Other Ambulatory Visit: Payer: Self-pay | Admitting: Internal Medicine

## 2013-10-22 ENCOUNTER — Other Ambulatory Visit: Payer: Self-pay | Admitting: *Deleted

## 2013-10-22 DIAGNOSIS — C9000 Multiple myeloma not having achieved remission: Secondary | ICD-10-CM

## 2013-10-22 NOTE — Telephone Encounter (Signed)
THIS REFILL REQUEST FOR REVLIMID WAS GIVEN TO DR.CHISM'S NURSE, KATHY BUYCK,RN. 

## 2013-10-25 MED ORDER — LENALIDOMIDE 10 MG PO CAPS: ORAL_CAPSULE | ORAL | Status: DC

## 2013-10-25 NOTE — Addendum Note (Signed)
Addended by: Wyonia Hough on: 10/25/2013 04:33 PM   Modules accepted: Orders

## 2013-10-27 NOTE — Telephone Encounter (Signed)
RECEIVED A FAX FROM [facility] CONCERNING A CONFIRMATION OF PRESCRIPTION SHIPMENT FOR REVLIMID ON 10/28/13.

## 2013-10-29 ENCOUNTER — Other Ambulatory Visit: Payer: Self-pay | Admitting: Internal Medicine

## 2013-11-10 ENCOUNTER — Other Ambulatory Visit: Payer: Self-pay | Admitting: Internal Medicine

## 2013-11-15 ENCOUNTER — Other Ambulatory Visit: Payer: Self-pay | Admitting: *Deleted

## 2013-11-15 DIAGNOSIS — C9000 Multiple myeloma not having achieved remission: Secondary | ICD-10-CM

## 2013-11-15 MED ORDER — LENALIDOMIDE 10 MG PO CAPS: ORAL_CAPSULE | ORAL | Status: DC

## 2013-11-15 NOTE — Addendum Note (Signed)
Addended by: Wyonia Hough on: 11/15/2013 01:13 PM   Modules accepted: Orders

## 2013-11-15 NOTE — Telephone Encounter (Signed)
THIS REFILL REQUEST WAS GIVEN TO DR.CHISM'S NURSE, ROBIN BASS,RN.

## 2013-11-18 ENCOUNTER — Telehealth: Payer: Self-pay | Admitting: Internal Medicine

## 2013-11-18 NOTE — Telephone Encounter (Signed)
returned pt call and lvm for pt conf out appts

## 2013-11-29 ENCOUNTER — Other Ambulatory Visit: Payer: Self-pay | Admitting: Internal Medicine

## 2013-11-30 ENCOUNTER — Other Ambulatory Visit: Payer: Self-pay | Admitting: *Deleted

## 2013-11-30 DIAGNOSIS — D72819 Decreased white blood cell count, unspecified: Secondary | ICD-10-CM

## 2013-11-30 DIAGNOSIS — C9000 Multiple myeloma not having achieved remission: Secondary | ICD-10-CM

## 2013-11-30 DIAGNOSIS — Z7901 Long term (current) use of anticoagulants: Secondary | ICD-10-CM

## 2013-11-30 MED ORDER — GABAPENTIN 600 MG PO TABS: ORAL_TABLET | ORAL | Status: DC

## 2013-11-30 MED ORDER — WARFARIN SODIUM 6 MG PO TABS: 6.0000 mg | ORAL_TABLET | Freq: Every day | ORAL | Status: DC

## 2013-11-30 MED ORDER — POTASSIUM CHLORIDE CRYS ER 20 MEQ PO TBCR: 30.0000 meq | EXTENDED_RELEASE_TABLET | Freq: Every day | ORAL | Status: DC

## 2013-12-03 ENCOUNTER — Other Ambulatory Visit: Payer: Self-pay | Admitting: *Deleted

## 2013-12-03 DIAGNOSIS — C9000 Multiple myeloma not having achieved remission: Secondary | ICD-10-CM

## 2013-12-03 MED ORDER — LENALIDOMIDE 10 MG PO CAPS: ORAL_CAPSULE | ORAL | Status: DC

## 2013-12-03 NOTE — Telephone Encounter (Signed)
THIS REFILL REQUEST FOR REVLIMID WAS GIVEN TO DR.SEHBAI'S NURSE, STACEY CAMP,RN. 

## 2013-12-03 NOTE — Addendum Note (Signed)
Addended by: Wyonia Hough on: 12/03/2013 04:35 PM   Modules accepted: Orders

## 2013-12-08 ENCOUNTER — Telehealth: Payer: Self-pay

## 2013-12-08 NOTE — Telephone Encounter (Signed)
Notification from [facility] that revlimid will ship 12/09/13

## 2013-12-10 ENCOUNTER — Other Ambulatory Visit: Payer: Self-pay | Admitting: Internal Medicine

## 2013-12-13 ENCOUNTER — Other Ambulatory Visit: Payer: Self-pay | Admitting: Medical Oncology

## 2013-12-13 MED ORDER — ALLOPURINOL 300 MG PO TABS: 300.0000 mg | ORAL_TABLET | Freq: Every day | ORAL | Status: DC

## 2013-12-13 MED ORDER — PANTOPRAZOLE SODIUM 40 MG PO TBEC: 40.0000 mg | DELAYED_RELEASE_TABLET | Freq: Every day | ORAL | Status: DC

## 2013-12-24 ENCOUNTER — Other Ambulatory Visit: Payer: Self-pay | Admitting: *Deleted

## 2013-12-24 DIAGNOSIS — C9000 Multiple myeloma not having achieved remission: Secondary | ICD-10-CM

## 2013-12-24 NOTE — Telephone Encounter (Signed)
THIS REFILL REQUEST FOR REVLIMID WAS GIVEN TO DR.SEHBAI'S NURSE, STACEY CAMP,RN. 

## 2013-12-27 ENCOUNTER — Telehealth: Payer: Self-pay

## 2013-12-27 MED ORDER — LENALIDOMIDE 10 MG PO CAPS: ORAL_CAPSULE | ORAL | Status: DC

## 2013-12-27 NOTE — Telephone Encounter (Signed)
Notification that prescription benefits have been verified and their copay is $0. Notification of shipment of revlimid on 12/28/13

## 2013-12-27 NOTE — Addendum Note (Signed)
Addended by: Wyonia Hough on: 12/27/2013 10:39 AM   Modules accepted: Orders

## 2014-01-07 ENCOUNTER — Ambulatory Visit (HOSPITAL_BASED_OUTPATIENT_CLINIC_OR_DEPARTMENT_OTHER): Payer: Medicare Other | Admitting: Hematology

## 2014-01-07 ENCOUNTER — Other Ambulatory Visit (HOSPITAL_BASED_OUTPATIENT_CLINIC_OR_DEPARTMENT_OTHER): Payer: Medicare Other

## 2014-01-07 ENCOUNTER — Telehealth: Payer: Self-pay | Admitting: Hematology

## 2014-01-07 ENCOUNTER — Encounter: Payer: Self-pay | Admitting: Hematology

## 2014-01-07 ENCOUNTER — Ambulatory Visit (HOSPITAL_BASED_OUTPATIENT_CLINIC_OR_DEPARTMENT_OTHER): Payer: Medicare Other

## 2014-01-07 ENCOUNTER — Telehealth: Payer: Self-pay | Admitting: *Deleted

## 2014-01-07 VITALS — BP 141/64 | HR 67 | Temp 98.2°F | Resp 18 | Ht 66.0 in | Wt 169.6 lb

## 2014-01-07 DIAGNOSIS — C9001 Multiple myeloma in remission: Secondary | ICD-10-CM

## 2014-01-07 DIAGNOSIS — Z23 Encounter for immunization: Secondary | ICD-10-CM | POA: Diagnosis not present

## 2014-01-07 DIAGNOSIS — C9000 Multiple myeloma not having achieved remission: Secondary | ICD-10-CM

## 2014-01-07 DIAGNOSIS — G620 Drug-induced polyneuropathy: Secondary | ICD-10-CM

## 2014-01-07 DIAGNOSIS — D649 Anemia, unspecified: Secondary | ICD-10-CM

## 2014-01-07 LAB — COMPREHENSIVE METABOLIC PANEL (CC13)
ALT: 15 U/L (ref 0–55)
AST: 16 U/L (ref 5–34)
Albumin: 3.7 g/dL (ref 3.5–5.0)
Alkaline Phosphatase: 60 U/L (ref 40–150)
Anion Gap: 7 mEq/L (ref 3–11)
BUN: 12.7 mg/dL (ref 7.0–26.0)
CO2: 28 mEq/L (ref 22–29)
Calcium: 9.8 mg/dL (ref 8.4–10.4)
Chloride: 108 mEq/L (ref 98–109)
Creatinine: 0.9 mg/dL (ref 0.6–1.1)
Glucose: 99 mg/dl (ref 70–140)
Potassium: 4 mEq/L (ref 3.5–5.1)
Sodium: 143 mEq/L (ref 136–145)
Total Bilirubin: 0.34 mg/dL (ref 0.20–1.20)
Total Protein: 7.7 g/dL (ref 6.4–8.3)

## 2014-01-07 LAB — CBC WITH DIFFERENTIAL/PLATELET
BASO%: 1 % (ref 0.0–2.0)
Basophils Absolute: 0 10*3/uL (ref 0.0–0.1)
EOS%: 3.3 % (ref 0.0–7.0)
Eosinophils Absolute: 0.1 10*3/uL (ref 0.0–0.5)
HCT: 37.5 % (ref 34.8–46.6)
HGB: 11.8 g/dL (ref 11.6–15.9)
LYMPH%: 44.1 % (ref 14.0–49.7)
MCH: 31.8 pg (ref 25.1–34.0)
MCHC: 31.6 g/dL (ref 31.5–36.0)
MCV: 100.7 fL (ref 79.5–101.0)
MONO#: 0.4 10*3/uL (ref 0.1–0.9)
MONO%: 11.2 % (ref 0.0–14.0)
NEUT#: 1.3 10*3/uL — ABNORMAL LOW (ref 1.5–6.5)
NEUT%: 40.4 % (ref 38.4–76.8)
Platelets: 237 10*3/uL (ref 145–400)
RBC: 3.72 10*6/uL (ref 3.70–5.45)
RDW: 15.2 % — ABNORMAL HIGH (ref 11.2–14.5)
WBC: 3.2 10*3/uL — ABNORMAL LOW (ref 3.9–10.3)
lymph#: 1.4 10*3/uL (ref 0.9–3.3)

## 2014-01-07 LAB — LACTATE DEHYDROGENASE (CC13): LDH: 189 U/L (ref 125–245)

## 2014-01-07 MED ORDER — INFLUENZA VAC SPLIT QUAD 0.5 ML IM SUSY
0.5000 mL | PREFILLED_SYRINGE | Freq: Once | INTRAMUSCULAR | Status: AC
  Administered 2014-01-07: 0.5 mL via INTRAMUSCULAR
  Filled 2014-01-07: qty 0.5

## 2014-01-07 MED ORDER — LOPERAMIDE HCL 2 MG PO CAPS: 2.0000 mg | ORAL_CAPSULE | ORAL | Status: DC | PRN

## 2014-01-07 MED ORDER — ZOLEDRONIC ACID 4 MG/100ML IV SOLN
4.0000 mg | Freq: Once | INTRAVENOUS | Status: AC
  Administered 2014-01-07: 4 mg via INTRAVENOUS
  Filled 2014-01-07: qty 100

## 2014-01-07 MED ORDER — SODIUM CHLORIDE 0.9 % IV SOLN
INTRAVENOUS | Status: DC
  Administered 2014-01-07: 11:00:00 via INTRAVENOUS

## 2014-01-07 NOTE — Telephone Encounter (Signed)
gave and printed apt sch and avs sent pt to inj.

## 2014-01-07 NOTE — Progress Notes (Signed)
[facility] PROGRESS NOTE 01/07/2014  Carmen Pel, MD [place] [id number]  DIAGNOSIS: Multiple myeloma in remission - Plan: CBC with Differential, Comprehensive metabolic panel (Cmet) - [facility], SPEP & IFE with QIG, Kappa/lambda light chains, Influenza vac split quadrivalent PF (FLUARIX) injection 0.5 mL  Chief Complaint  Patient presents with  . Follow-up    CURRENT THERAPY:-Revlimid 10 mg daily, 2 weeks on, 1 week off. Revlimid was started on 01/02/2014. Zometa 4 mg IV currently being given every 3 months. This was started on 09/28/2010 and initially was given monthly. Coumadin 6 mg daily to maintain an INR of approximately 1.5. This is being followed by [facility].   IMMUNIZATIONS:  Pneumovax was given in July 2012.  Flu shot was given on February 12, 2011. She receives flu shot today. Inoculations following high-dose therapy and autologous stem cell infusion were given on May 05, 2011.  INTERVAL HISTORY: Carmen Fisher 73 y.o. female with a history of Multiple myeloma is here for follow-up. She was last seen by me on 07/09//2015.  Clinically, she continues to do well with no change in her condition.  She denies any side effects from the IV Zometa that she receives every 3 months.  She has no scheduled dental extractions.  She will make a follow up with dentistry annually.  She is on Revlimid 10 mg daily by mouth, 2 weeks out of every 3 weeks.  She is also followed by [facility] about once a year with her last visit this past May with recommendations to continue her revlimid.  She continues to have grade 1 neuropathy symptoms but otherwise is without problems.  Today, she denies nausea/vomiting, abdominal, fevers/chills or acute shortness of breath.  She denies hematuria or dysuria. She also denies bone pain or night sweats or weight loss. She did see a urologist due to urinary urgency with a normal bladder  ultrasound.  She denies a recent hospitalization or emergency room visit.  She had a mammogram Dec. 2014 which was negative. Last year she had severe UTI and now she takes cranberry pills 4/daily which helping her. She is on warfarin prophylaxis as allergic to ASA.  MEDICAL HISTORY: Past Medical History  Diagnosis Date  . Multiple myeloma, without mention of having achieved remission   . Anemia, unspecified   . Hypertension   . Peripheral neuropathy   . Dyslipidemia   . Glaucoma   . Multiple myeloma   . Chronic anticoagulation   . Glaucoma   . Gout   . Osteoporosis   . Hypokalemia   . GERD (gastroesophageal reflux disease)     INTERIM HISTORY: has Multiple myeloma; Peripheral neuropathy, secondary to drugs or chemicals; Multiple myeloma; Multiple myeloma; Chronic anticoagulation; UTI (lower urinary tract infection); Hypotension; Altered mental status; Dehydration; Leukopenia; ARF (acute renal failure); Elevated liver enzymes; Sepsis; and Neutropenic fever on her problem list.    ALLERGIES:  is allergic to aspirin; aspirin; penicillins; sulfa antibiotics; and sulfa antibiotics.  MEDICATIONS: has a current medication list which includes the following prescription(s): acetaminophen, allopurinol, magic mouthwash, cholecalciferol, cranberry, gabapentin, latanoprost, lenalidomide, loperamide, lorazepam, multivitamin with minerals, oxycodone, pantoprazole, potassium chloride sa, prochlorperazine, timolol, warfarin, and zolendronic acid, and the following Facility-Administered Medications: influenza vac split quadrivalent pf.  SURGICAL HISTORY:  Past Surgical History  Procedure Laterality Date  . Appendectomy    . Abdominal hysterectomy  1994    w/BSO   PROBLEM LIST:  1. Multiple myeloma diagnosed in  August 2010. She has kappa light chain disease. In addition, she was found to have monoclonal IgG as well in her urine back at the time of diagnosis. Bone marrow initially showed 50% plasma  cells. Metastatic bone survey carried out on March 30, 2009 was negative. Carmen Fisher was initially treated with Velcade, Decadron and Doxil for 4-1/2 cycles from 04/24/2009 through 07/27/2009. She developed peripheral neuropathy. Another bone marrow was carried out on 08/03/2009,  showed 30% plasma cells. Revlimid and Decadron were given from 09/01/2009 through December 2011. Bone marrow on March 01, 2010 showed 5% plasma cells. 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 She has had an excellent response to that treatment. Since June 2012, she has been receiving maintenance Revlimid and Zometa. Serum immunofixation electrophoresis from 10/26/2010 was negative. Urine immunofixation electrophoresis from July 2012 continues to show monoclonal free kappa light chains. Total urine protein was 125 mg at that time. Back at the time of diagnosis in December 2010, the 24-hour urine protein was over 8 g. The patient's most recent metastatic bone survey carried out at [facility] on 02/07/2011 was Negative. A 24 hour urine collection from 02/06/2012 yielded a protein of 704 mg as compared with 218 mg on 07/30/2011, 125 mg on 04/19/2011 and 62 mg on 07/10/2010. The free kappa light chain excretion was 123.5 mg suggesting that most of the protein is not the kappa light chain. The urine kappa to lambda ratio was 31.88 and the urine immunofixation electrophoresis was positive for monoclonal kappa light  chains. Urine protein to creatinine ratio was 0.04. Serum immunofixation electrophoresis from 02/06/2012 showed no monoclonal protein. IgG level was 1530, IgA level 187 and IgM level 32, the latter being low. Metastatic bone survey from 02/06/2012 showed no evidence for metastatic disease. A 24-hour urine collection from 04/30/2012 yielded 342 mg of protein, of which 304 mg were monoclonal kappa light chains. Urine immunofixation electrophoresis was positive for monoclonal free  kappa light chains.  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 Symptoms developed in April 2011. The patient is currently on Neurontin.  3. Hypertension.  4. GERD.  5. Dyslipidemia.  6. Glaucoma. 7. Admitted to ICU with E.coli bacteremia secondary to pyelonephritis (11/2012).  REVIEW OF SYSTEMS:   Constitutional: Denies fevers, chills or abnormal weight loss Eyes: Denies blurriness of vision Ears, nose, mouth, throat, and face: Denies mucositis or sore throat Respiratory: Denies cough, dyspnea or wheezes Cardiovascular: Denies palpitation, chest discomfort or lower extremity swelling Gastrointestinal:  Denies nausea, heartburn or change in bowel habits Skin: Denies abnormal skin rashes Lymphatics: Denies new lymphadenopathy or easy bruising Neurological:Denies numbness, tingling or new weaknesses Behavioral/Psych: Mood is stable, no new changes  All other systems were reviewed with the patient and are negative.  PHYSICAL EXAMINATION: ECOG PERFORMANCE STATUS: 0  Blood pressure 141/64, pulse 67, temperature 98.2 F (36.8 C), temperature source Oral, resp. rate 18, height _0  (1.676 m), weight 169 lb 9.6 oz (76.93 kg), SpO2 100.00%.  GENERAL:alert, no distress and comfortable; well developed and well nourished.  SKIN: skin color, texture, turgor are normal, no rashes or significant lesions EYES: normal, Conjunctiva are pink and non-injected, sclera clear OROPHARYNX:no exudate, no erythema and lips, buccal mucosa, and tongue normal, No ONJ or ulcerations. NECK: supple, thyroid normal size, non-tender, without nodularity LYMPH:  no palpable lymphadenopathy in the cervical, axillary or supraclavicular LUNGS: clear to auscultation and percussion with normal breathing effort HEART: regular rate & rhythm and SEM and no lower  extremity edema ABDOMEN:abdomen soft, non-tender and normal bowel sounds Musculoskeletal:no cyanosis of digits and no clubbing  NEURO: alert & oriented  x 3 with fluent speech, no focal motor/sensory deficits  Labs:  Lab Results  Component Value Date   WBC 3.2* 01/07/2014   HGB 11.8 01/07/2014   HCT 37.5 01/07/2014   MCV 100.7 01/07/2014   PLT 237 01/07/2014   NEUTROABS 1.3* 01/07/2014      Chemistry      Component Value Date/Time   NA 143 01/07/2014 0918   NA 142 12/08/2012 0640   K 4.0 01/07/2014 0918   K 3.0* 12/08/2012 0640   CL 109 12/08/2012 0640   CL 106 07/28/2012 0924   CO2 28 01/07/2014 0918   CO2 23 12/08/2012 0640   BUN 12.7 01/07/2014 0918   BUN 11 12/08/2012 0640   CREATININE 0.9 01/07/2014 0918   CREATININE 0.83 12/08/2012 0640   CREATININE 0.93 10/01/2010 0836      Component Value Date/Time   CALCIUM 9.8 01/07/2014 0918   CALCIUM 7.8* 12/08/2012 0640   ALKPHOS 60 01/07/2014 0918   ALKPHOS 68 12/08/2012 0640   AST 16 01/07/2014 0918   AST 47* 12/08/2012 0640   ALT 15 01/07/2014 0918   ALT 52* 12/08/2012 0640   BILITOT 0.34 01/07/2014 0918   BILITOT 0.4 12/08/2012 0640     GFR Estimated Creatinine Clearance: 60 ml/min (by C-G formula based on Cr of 0.9).   RADIOGRAPHIC STUDIES: 1. Chest x-ray, 2 view, from 03/21/2010 showed a tortuous aorta and scoliosis, otherwise negative.  2. Metastatic bone survey was carried out at [facility] on 02/07/2011. This was negative. The report has been scanned into the Epic system.  3. Metastatic bone survey from 02/06/2012 showed no evidence for metastatic disease. There were no lytic lesions suggestive of multiple myeloma. 4. Metastatic bone survey from 02/15/2013 showed no evidence for metastatic disease. There were no lytic lesions suggestive of multiple myeloma.  ASSESSMENT: BRAELEE HERRLE 73 y.o. female with a history of Multiple myeloma in remission - Plan: CBC with Differential, Comprehensive metabolic panel (Cmet) - [facility], SPEP & IFE with QIG, Kappa/lambda light chains, Influenza vac split quadrivalent PF (FLUARIX) injection 0.5 mL  PLAN:  1. 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 --Carmen Fisher continues to do well, now approaching over 4 years from the time of diagnosis, which apparently was made in December 2010, when a bone marrow at that time showed 50% plasma cells. The patient is more than 2 years out from the time of high-dose melphalan and autologous stem cell transplant.   --Carmen Fisher is due for Zometa today. She receives this every 3 months. Her calcium is within normal limits.  She denies any dental procedures or dental problems.   --She will require her annual bone survey for her multiple myeloma with next visit.  She will continue with Revlimid 10 mg daily, 2 weeks out of every 3. She will continue on the Coumadin to maintain an INR somewhere around 1.5 since she is allergic to ASA.  --Her M spike is not detected from her last labs.  IgG is 1460.  Her kappa:lambda ratio is 1.88.  Her creatinine is within normal limits.    --we will call her with her myeloma labs from today which are pending.  2. 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 --Her symptoms of numbness and tingling is stable.  She is able to complete her daily task including buttoning her  shirts and ambulation without falls without difficulty.   3. Anemia.  --Her hemoglobin is 11.8today.  Likely secondary to chronic disease.  She is asymptomatic presently.  She has mild intermittent leukopenia as well.  4. Follow-up. The patient was seen by Dr. Alvie Heidelberg in May 2016. We have asked Carmen Fisher to return to see [place] in about 3 months, CBC, Chemistries and SPEP plus quantitative immunoglobins, kappa/lambda light chains.   All questions were answered. The patient knows to call the clinic with any problems, questions or concerns. We can certainly see the patient much sooner if necessary.  I spent 25 minutes counseling the patient face to face. The total time spent in the appointment was 30 minutes.    Bernadene Bell, MD Medical Hematologist/Oncologist [place] Pager: [phone] Office No: [phone]

## 2014-01-07 NOTE — Telephone Encounter (Signed)
Per staff message and POF I have scheduled appts. Advised scheduler of appts. JMW  

## 2014-01-07 NOTE — Patient Instructions (Signed)
[facility] Discharge Instructions for Patients Receiving Chemotherapy  Today you received the following agent : Zometa  BELOW ARE SYMPTOMS THAT SHOULD BE REPORTED IMMEDIATELY:  *FEVER GREATER THAN 100.5 F  *CHILLS WITH OR WITHOUT FEVER  NAUSEA AND VOMITING THAT IS NOT CONTROLLED WITH YOUR NAUSEA MEDICATION  *UNUSUAL SHORTNESS OF BREATH  *UNUSUAL BRUISING OR BLEEDING  TENDERNESS IN MOUTH AND THROAT WITH OR WITHOUT PRESENCE OF ULCERS  *URINARY PROBLEMS  *BOWEL PROBLEMS  UNUSUAL RASH Items with * indicate a potential emergency and should be followed up as soon as possible.  Feel free to call the clinic should you have any questions or concerns. The clinic phone number is [phone].  It has been a pleasure to serve you today!

## 2014-01-11 LAB — SPEP & IFE WITH QIG
Albumin ELP: 51.2 % — ABNORMAL LOW (ref 55.8–66.1)
Alpha-1-Globulin: 6.9 % — ABNORMAL HIGH (ref 2.9–4.9)
Alpha-2-Globulin: 9.7 % (ref 7.1–11.8)
Beta 2: 4.3 % (ref 3.2–6.5)
Beta Globulin: 7 % (ref 4.7–7.2)
Gamma Globulin: 20.9 % — ABNORMAL HIGH (ref 11.1–18.8)
IgA: 359 mg/dL (ref 69–380)
IgG (Immunoglobin G), Serum: 1450 mg/dL (ref 690–1700)
IgM, Serum: 29 mg/dL — ABNORMAL LOW (ref 52–322)
Total Protein, Serum Electrophoresis: 7.2 g/dL (ref 6.0–8.3)

## 2014-01-11 LAB — KAPPA/LAMBDA LIGHT CHAINS
Kappa free light chain: 5.36 mg/dL — ABNORMAL HIGH (ref 0.33–1.94)
Kappa:Lambda Ratio: 2.06 — ABNORMAL HIGH (ref 0.26–1.65)
Lambda Free Lght Chn: 2.6 mg/dL (ref 0.57–2.63)

## 2014-01-14 ENCOUNTER — Other Ambulatory Visit: Payer: Self-pay | Admitting: *Deleted

## 2014-01-14 NOTE — Telephone Encounter (Signed)
THIS REQUEST FOR REVLIMID WAS GIVEN TO DR.SEHBAI'S NURSE, ROBIN BASS,RN.

## 2014-01-17 ENCOUNTER — Telehealth: Payer: Self-pay | Admitting: *Deleted

## 2014-01-17 ENCOUNTER — Other Ambulatory Visit: Payer: Self-pay | Admitting: *Deleted

## 2014-01-17 DIAGNOSIS — C9000 Multiple myeloma not having achieved remission: Secondary | ICD-10-CM

## 2014-01-17 MED ORDER — LENALIDOMIDE 10 MG PO CAPS: ORAL_CAPSULE | ORAL | Status: DC

## 2014-01-17 NOTE — Telephone Encounter (Signed)
[facility] faxed revlimid refill request.  Request to provider's desk/in-basket for review.

## 2014-01-17 NOTE — Telephone Encounter (Signed)
[facility] faxed confirmation of facsimile receipt for Revlimid prescription referral.  [facility] will verify insurance and make delivery arrangements with patient.  Will ship for delivery on 01-18-2014.

## 2014-01-26 ENCOUNTER — Other Ambulatory Visit: Payer: Self-pay | Admitting: Nurse Practitioner

## 2014-01-31 ENCOUNTER — Other Ambulatory Visit: Payer: Self-pay | Admitting: *Deleted

## 2014-01-31 DIAGNOSIS — Z7901 Long term (current) use of anticoagulants: Secondary | ICD-10-CM

## 2014-01-31 DIAGNOSIS — C9000 Multiple myeloma not having achieved remission: Secondary | ICD-10-CM

## 2014-01-31 DIAGNOSIS — D72819 Decreased white blood cell count, unspecified: Secondary | ICD-10-CM

## 2014-01-31 MED ORDER — GABAPENTIN 600 MG PO TABS: ORAL_TABLET | ORAL | Status: DC

## 2014-02-01 ENCOUNTER — Other Ambulatory Visit: Payer: Self-pay | Admitting: *Deleted

## 2014-02-01 DIAGNOSIS — C9000 Multiple myeloma not having achieved remission: Secondary | ICD-10-CM

## 2014-02-01 DIAGNOSIS — Z7901 Long term (current) use of anticoagulants: Secondary | ICD-10-CM

## 2014-02-01 DIAGNOSIS — D72819 Decreased white blood cell count, unspecified: Secondary | ICD-10-CM

## 2014-02-01 MED ORDER — GABAPENTIN 600 MG PO TABS: ORAL_TABLET | ORAL | Status: DC

## 2014-02-07 ENCOUNTER — Other Ambulatory Visit: Payer: Self-pay

## 2014-02-07 DIAGNOSIS — Z1231 Encounter for screening mammogram for malignant neoplasm of breast: Secondary | ICD-10-CM

## 2014-02-08 ENCOUNTER — Other Ambulatory Visit: Payer: Self-pay | Admitting: *Deleted

## 2014-02-08 NOTE — Telephone Encounter (Signed)
THIS REFILL REQUEST FOR REVLIMID WAS GIVEN TO DR.SEHBAI'S NURSE, STACEY CAMP,RN. 

## 2014-02-09 ENCOUNTER — Other Ambulatory Visit: Payer: Self-pay

## 2014-02-09 DIAGNOSIS — C9000 Multiple myeloma not having achieved remission: Secondary | ICD-10-CM

## 2014-02-09 MED ORDER — LENALIDOMIDE 10 MG PO CAPS: ORAL_CAPSULE | ORAL | Status: DC

## 2014-02-25 ENCOUNTER — Other Ambulatory Visit: Payer: Self-pay | Admitting: *Deleted

## 2014-02-25 DIAGNOSIS — C9000 Multiple myeloma not having achieved remission: Secondary | ICD-10-CM

## 2014-02-25 MED ORDER — LENALIDOMIDE 10 MG PO CAPS: ORAL_CAPSULE | ORAL | Status: DC

## 2014-03-01 ENCOUNTER — Telehealth: Payer: Self-pay | Admitting: *Deleted

## 2014-03-01 ENCOUNTER — Other Ambulatory Visit: Payer: Self-pay | Admitting: *Deleted

## 2014-03-01 DIAGNOSIS — D72819 Decreased white blood cell count, unspecified: Secondary | ICD-10-CM

## 2014-03-01 DIAGNOSIS — C9000 Multiple myeloma not having achieved remission: Secondary | ICD-10-CM

## 2014-03-01 DIAGNOSIS — Z7901 Long term (current) use of anticoagulants: Secondary | ICD-10-CM

## 2014-03-01 MED ORDER — WARFARIN SODIUM 6 MG PO TABS: 6.0000 mg | ORAL_TABLET | Freq: Every day | ORAL | Status: DC

## 2014-03-01 MED ORDER — WARFARIN SODIUM 6 MG PO TABS
6.0000 mg | ORAL_TABLET | Freq: Every day | ORAL | Status: DC
Start: 2014-03-01 — End: 2014-03-01

## 2014-03-01 NOTE — Telephone Encounter (Signed)
Pt called requesting refill of Warfarin 6mg .  Dr. Burr Medico notified.  OK for refill as per md.  Warfarin refill sent electronically to [facility] on [address].

## 2014-03-09 ENCOUNTER — Ambulatory Visit: Payer: Medicare Other

## 2014-03-16 ENCOUNTER — Telehealth: Payer: Self-pay | Admitting: Internal Medicine

## 2014-03-16 NOTE — Telephone Encounter (Signed)
, °

## 2014-03-17 ENCOUNTER — Ambulatory Visit
Admission: RE | Admit: 2014-03-17 | Discharge: 2014-03-17 | Disposition: A | Discharge status: Home / Self Care | Payer: Medicare Other | Source: Ambulatory Visit

## 2014-03-17 DIAGNOSIS — Z1231 Encounter for screening mammogram for malignant neoplasm of breast: Secondary | ICD-10-CM

## 2014-03-21 ENCOUNTER — Other Ambulatory Visit: Payer: Self-pay | Admitting: *Deleted

## 2014-03-21 DIAGNOSIS — C9000 Multiple myeloma not having achieved remission: Secondary | ICD-10-CM

## 2014-03-21 MED ORDER — LENALIDOMIDE 10 MG PO CAPS: ORAL_CAPSULE | ORAL | Status: DC

## 2014-03-21 NOTE — Telephone Encounter (Signed)
Refill request for Revlimid 10 mg to MD desk.

## 2014-03-22 NOTE — Telephone Encounter (Signed)
RECEIVED A FAX FROM [facility] CONCERNING A FACSIMILE RECEIPT FOR PT. REFERRAL.

## 2014-03-29 ENCOUNTER — Telehealth: Payer: Self-pay | Admitting: *Deleted

## 2014-03-29 DIAGNOSIS — C9000 Multiple myeloma not having achieved remission: Secondary | ICD-10-CM

## 2014-03-29 DIAGNOSIS — Z7901 Long term (current) use of anticoagulants: Secondary | ICD-10-CM

## 2014-03-29 DIAGNOSIS — D72819 Decreased white blood cell count, unspecified: Secondary | ICD-10-CM

## 2014-03-29 MED ORDER — POTASSIUM CHLORIDE CRYS ER 20 MEQ PO TBCR: 30.0000 meq | EXTENDED_RELEASE_TABLET | Freq: Every day | ORAL | Status: DC

## 2014-03-29 NOTE — Telephone Encounter (Signed)
Collaborative nurse brought written of a message received from patient that her pharmacy is waiting to hear from [facility] regarding potassium refill.  No refill request has been received by Triage fax, phone or Surescripts.  Is due for refill and will fill under on-call provider Dr. Julien Nordmann who she has also been re-assigned to see on 04-05-2014.

## 2014-04-04 ENCOUNTER — Other Ambulatory Visit: Payer: Medicare Other

## 2014-04-04 ENCOUNTER — Ambulatory Visit: Payer: Medicare Other

## 2014-04-05 ENCOUNTER — Other Ambulatory Visit (HOSPITAL_BASED_OUTPATIENT_CLINIC_OR_DEPARTMENT_OTHER): Payer: Commercial Managed Care - HMO

## 2014-04-05 ENCOUNTER — Ambulatory Visit (HOSPITAL_BASED_OUTPATIENT_CLINIC_OR_DEPARTMENT_OTHER): Payer: Commercial Managed Care - HMO | Admitting: Internal Medicine

## 2014-04-05 ENCOUNTER — Ambulatory Visit (HOSPITAL_BASED_OUTPATIENT_CLINIC_OR_DEPARTMENT_OTHER): Payer: Commercial Managed Care - HMO

## 2014-04-05 ENCOUNTER — Encounter: Payer: Self-pay | Admitting: Internal Medicine

## 2014-04-05 ENCOUNTER — Telehealth: Payer: Self-pay | Admitting: Internal Medicine

## 2014-04-05 VITALS — BP 133/59 | HR 78 | Temp 98.3°F | Resp 18 | Ht 66.0 in | Wt 170.1 lb

## 2014-04-05 DIAGNOSIS — C9000 Multiple myeloma not having achieved remission: Secondary | ICD-10-CM

## 2014-04-05 DIAGNOSIS — Z7901 Long term (current) use of anticoagulants: Secondary | ICD-10-CM | POA: Diagnosis not present

## 2014-04-05 DIAGNOSIS — IMO0002 Reserved for concepts with insufficient information to code with codable children: Secondary | ICD-10-CM

## 2014-04-05 DIAGNOSIS — D72819 Decreased white blood cell count, unspecified: Secondary | ICD-10-CM

## 2014-04-05 LAB — COMPREHENSIVE METABOLIC PANEL (CC13)
ALT: 19 U/L (ref 0–55)
AST: 18 U/L (ref 5–34)
Albumin: 3.6 g/dL (ref 3.5–5.0)
Alkaline Phosphatase: 61 U/L (ref 40–150)
Anion Gap: 7 mEq/L (ref 3–11)
BUN: 11.1 mg/dL (ref 7.0–26.0)
CO2: 26 mEq/L (ref 22–29)
Calcium: 9 mg/dL (ref 8.4–10.4)
Chloride: 109 mEq/L (ref 98–109)
Creatinine: 0.9 mg/dL (ref 0.6–1.1)
EGFR: 75 mL/min/{1.73_m2} — ABNORMAL LOW (ref >90)
Glucose: 116 mg/dl (ref 70–140)
Potassium: 3.7 mEq/L (ref 3.5–5.1)
Sodium: 142 mEq/L (ref 136–145)
Total Bilirubin: 0.35 mg/dL (ref 0.20–1.20)
Total Protein: 7.4 g/dL (ref 6.4–8.3)

## 2014-04-05 LAB — CBC WITH DIFFERENTIAL/PLATELET
BASO%: 1.3 % (ref 0.0–2.0)
Basophils Absolute: 0.1 10*3/uL (ref 0.0–0.1)
EOS%: 2.1 % (ref 0.0–7.0)
Eosinophils Absolute: 0.1 10*3/uL (ref 0.0–0.5)
HCT: 36.7 % (ref 34.8–46.6)
HGB: 11.5 g/dL — ABNORMAL LOW (ref 11.6–15.9)
LYMPH%: 46.3 % (ref 14.0–49.7)
MCH: 31.6 pg (ref 25.1–34.0)
MCHC: 31.3 g/dL — ABNORMAL LOW (ref 31.5–36.0)
MCV: 100.8 fL (ref 79.5–101.0)
MONO#: 0.3 10*3/uL (ref 0.1–0.9)
MONO%: 8.8 % (ref 0.0–14.0)
NEUT#: 1.6 10*3/uL (ref 1.5–6.5)
NEUT%: 41.5 % (ref 38.4–76.8)
Platelets: 235 10*3/uL (ref 145–400)
RBC: 3.64 10*6/uL — ABNORMAL LOW (ref 3.70–5.45)
RDW: 14.8 % — ABNORMAL HIGH (ref 11.2–14.5)
WBC: 3.9 10*3/uL (ref 3.9–10.3)
lymph#: 1.8 10*3/uL (ref 0.9–3.3)
nRBC: 0 % (ref 0–0)

## 2014-04-05 MED ORDER — WARFARIN SODIUM 2 MG PO TABS: 6.0000 mg | ORAL_TABLET | Freq: Every day | ORAL | Status: DC

## 2014-04-05 MED ORDER — SODIUM CHLORIDE 0.9 % IV SOLN
Freq: Once | INTRAVENOUS | Status: AC
  Administered 2014-04-05: 12:00:00 via INTRAVENOUS

## 2014-04-05 MED ORDER — ZOLEDRONIC ACID 4 MG/100ML IV SOLN
4.0000 mg | Freq: Once | INTRAVENOUS | Status: AC
  Administered 2014-04-05: 4 mg via INTRAVENOUS
  Filled 2014-04-05: qty 100

## 2014-04-05 NOTE — Patient Instructions (Signed)

## 2014-04-05 NOTE — Progress Notes (Signed)
[place] Telephone:(336) [phone]   Fax:(336) [phone]  OFFICE PROGRESS NOTE  [name], MD [address] [place] [address]  DIAGNOSIS: Multiple myeloma, kappa light chain disease diagnosed in August 2010.  PRIOR THERAPY: 1) status post systemic chemotherapy with Velcade, Doxil and Decadron between 04/24/2009 through 07/27/2009 discontinued secondary to peripheral neuropathy. 2) status post treatment with Revlimid and Decadron between 09/01/2009 through December 2011. 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 Miki Kins.   CURRENT THERAPY:  1) Maintenance chemotherapy with Revlimid 10 mg by mouth daily for 14 days every 3 weeks started June 2012. 2) Zometa 4 mg IV every 3 months. 3) prophylactic anticoagulation with Coumadin 6 mg by mouth daily.  INTERVAL HISTORY: Carmen Fisher 74 y.o. female returns to the clinic today for follow-up visit. She is a former patient of Dr. Ralene Ok, Dr. Juliann Mule and recently Dr. Lona Kettle who is here today to establish care with me after these physicians have left the practice. The patient mentions that she was diagnosed with multiple myeloma in August 2010 after routine evaluation by her primary care physician and she was found to have increased blood and urine protein. She was initially treated with Velcade, Doxil and Decadron but this treatment was discontinued secondary to peripheral neuropathy. She was then received treatment with Revlimid and Decadron with improvement in her disease followed by peripheral blood autologous stem cell transplant at [facility]. She has been on treatment with maintenance Revlimid 10 mg by mouth daily since June 2012 and has been tolerating her treatment fairly well with no significant adverse effects. She continues to have residual peripheral neuropathy from her previous treatment. The patient is  feeling fine today with no specific complaints. She denied having any significant fatigue or weakness. She denied having any chest pain, shortness breath, cough or hemoptysis. She has no fever or chills, no nausea or vomiting. She had repeat myeloma panel performed earlier today and she is here for evaluation and discussion of her lab results.  MEDICAL HISTORY: Past Medical History  Diagnosis Date  . Multiple myeloma, without mention of having achieved remission   . Anemia, unspecified   . Hypertension   . Peripheral neuropathy   . Dyslipidemia   . Glaucoma   . Multiple myeloma   . Chronic anticoagulation   . Glaucoma   . Gout   . Osteoporosis   . Hypokalemia   . GERD (gastroesophageal reflux disease)     ALLERGIES:  is allergic to aspirin; aspirin; penicillins; sulfa antibiotics; and sulfa antibiotics.  MEDICATIONS:  Current Outpatient Prescriptions  Medication Sig Dispense Refill  . acetaminophen (TYLENOL) 500 MG tablet Take 500 mg by mouth every 6 (six) hours as needed.      Marland Kitchen allopurinol (ZYLOPRIM) 300 MG tablet Take 1 tablet (300 mg total) by mouth daily. 30 tablet 3  . Cranberry 250 MG TABS Take 250 mg by mouth 2 (two) times daily.    Marland Kitchen gabapentin (NEURONTIN) 600 MG tablet TAKE ONE TABLET BY MOUTH TWICE DAILY 60 tablet 4  . latanoprost (XALATAN) 0.005 % ophthalmic solution 1 drop at bedtime.      Marland Kitchen lenalidomide (REVLIMID) 10 MG capsule TAKE ONE 10 mg CAPSULE DAILY BY MOUTH FOR 14 DAYS THEN REST 7 Days. 14 capsule 0  . loperamide (IMODIUM) 2 MG capsule Take 1 capsule (2 mg total) by mouth as needed for diarrhea or loose  stools. 30 capsule 3  . Multiple Vitamin (MULTIVITAMIN WITH MINERALS) TABS tablet Take 1 tablet by mouth daily.    Marland Kitchen oxyCODONE (OXY IR/ROXICODONE) 5 MG immediate release tablet Take 1 tablet (5 mg total) by mouth every 6 (six) hours as needed. 30 tablet 0  . pantoprazole (PROTONIX) 40 MG tablet Take 1 tablet (40 mg total) by mouth daily. 30 tablet 3  .  potassium chloride SA (K-DUR,KLOR-CON) 20 MEQ tablet Take 1.5 tablets (30 mEq total) by mouth daily. 45 tablet 0  . prochlorperazine (COMPAZINE) 10 MG tablet Take 10 mg by mouth every 6 (six) hours as needed.      . timolol (BETIMOL) 0.5 % ophthalmic solution 1 drop 2 (two) times daily.      Marland Kitchen warfarin (COUMADIN) 6 MG tablet Take 1 tablet (6 mg total) by mouth daily. 30 tablet 2  . zolendronic acid (ZOMETA) 4 MG/5ML injection Inject 4 mg into the vein every 3 (three) months.    . Alum & Mag Hydroxide-Simeth (MAGIC MOUTHWASH) SOLN Take 5 mLs by mouth as needed.      . cholecalciferol (VITAMIN D) 1000 UNITS tablet Take 1,000 Units by mouth daily.    Marland Kitchen LORazepam (ATIVAN) 1 MG tablet Take 1 mg by mouth every 4 (four) hours as needed.       No current facility-administered medications for this visit.    SURGICAL HISTORY:  Past Surgical History  Procedure Laterality Date  . Appendectomy    . Abdominal hysterectomy  1994    w/BSO    REVIEW OF SYSTEMS:  Constitutional: negative Eyes: negative Ears, nose, mouth, throat, and face: negative Respiratory: negative Cardiovascular: negative Gastrointestinal: negative Genitourinary:negative Integument/breast: negative Hematologic/lymphatic: negative Musculoskeletal:negative Neurological: positive for paresthesia Behavioral/Psych: negative Endocrine: negative Allergic/Immunologic: negative   PHYSICAL EXAMINATION: General appearance: alert, cooperative and no distress Head: Normocephalic, without obvious abnormality, atraumatic Neck: no adenopathy, no JVD, supple, symmetrical, trachea midline and thyroid not enlarged, symmetric, no tenderness/mass/nodules Lymph nodes: Cervical, supraclavicular, and axillary nodes normal. Resp: clear to auscultation bilaterally Back: symmetric, no curvature. ROM normal. No CVA tenderness. Cardio: regular rate and rhythm, S1, S2 normal, no murmur, click, rub or gallop GI: soft, non-tender; bowel sounds normal;  no masses,  no organomegaly Extremities: extremities normal, atraumatic, no cyanosis or edema Neurologic: Alert and oriented X 3, normal strength and tone. Normal symmetric reflexes. Normal coordination and gait  ECOG PERFORMANCE STATUS: 1 - Symptomatic but completely ambulatory  Blood pressure 133/59, pulse 78, temperature 98.3 F (36.8 C), temperature source Oral, resp. rate 18, height 5' 6"  (1.676 m), weight 170 lb 1.6 oz (77.157 kg), SpO2 99 %.  LABORATORY DATA: Lab Results  Component Value Date   WBC 3.2* 01/07/2014   HGB 11.8 01/07/2014   HCT 37.5 01/07/2014   MCV 100.7 01/07/2014   PLT 237 01/07/2014      Chemistry      Component Value Date/Time   NA 142 04/05/2014 1001   NA 142 12/08/2012 0640   K 3.7 04/05/2014 1001   K 3.0* 12/08/2012 0640   CL 109 12/08/2012 0640   CL 106 07/28/2012 0924   CO2 26 04/05/2014 1001   CO2 23 12/08/2012 0640   BUN 11.1 04/05/2014 1001   BUN 11 12/08/2012 0640   CREATININE 0.9 04/05/2014 1001   CREATININE 0.83 12/08/2012 0640   CREATININE 0.93 10/01/2010 0836      Component Value Date/Time   CALCIUM 9.0 04/05/2014 1001   CALCIUM 7.8* 12/08/2012 0640   ALKPHOS  61 04/05/2014 1001   ALKPHOS 68 12/08/2012 0640   AST 18 04/05/2014 1001   AST 47* 12/08/2012 0640   ALT 19 04/05/2014 1001   ALT 52* 12/08/2012 0640   BILITOT 0.35 04/05/2014 1001   BILITOT 0.4 12/08/2012 0640       RADIOGRAPHIC STUDIES: Mm Screening Breast Tomo Bilateral  03/17/2014   CLINICAL DATA:  Screening.  EXAM: DIGITAL SCREENING BILATERAL MAMMOGRAM WITH 3D TOMO WITH CAD  COMPARISON:  Previous exam(s).  ACR Breast Density Category b: There are scattered areas of fibroglandular density.  FINDINGS: There are no findings suspicious for malignancy. Images were processed with CAD.  IMPRESSION: No mammographic evidence of malignancy. A result letter of this screening mammogram will be mailed directly to the patient.  RECOMMENDATION: Screening mammogram in one year.  (Code:SM-B-01Y)  BI-RADS CATEGORY  1: Negative.   Electronically Signed   By: Evangeline Dakin [name]   On: 03/17/2014 14:54    ASSESSMENT AND PLAN: This is a very pleasant 74 years old African-American female with history of multiple myeloma diagnosed in August 2010 status post several regimen including autologous peripheral blood stem cell transplant and has been on maintenance Revlimid 10 mg by mouth daily since June 2012 and tolerating her treatment fairly well. Her myeloma panel is still pending. I recommended for the patient to continue her current treatment with Revlimid which she takes 2 weeks on and one-week off. I explained to the patient that we will continue with the same regimen as long she does not have evidence for disease progression. For the metastatic bone disease, she will continue on Zometa 4 mg IV every 3 months. I strongly encouraged the patient to keep a good dental hygiene and to follow-up with her dentist at regular basis. I also advised the patient to start taking calcium and vitamin D at least twice a day. For the prophylactic anticoagulation, the patient is currently on 6 mg of Coumadin which is a very high dose. I recommended for her to reduce her dose of Coumadin to 2 mg by mouth daily. She has no previous history of DVT or pulmonary embolism to keep her on a high-dose of Coumadin. Also gave her the option of switching to aspirin but the patient has intolerance to it. I would see her back for follow-up visit in 3 months with repeat myeloma panel if there is no evidence for disease progression on the lab performed today. She was advised to call immediately if she has any concerning symptoms in the interval. The patient voices understanding of current disease status and treatment options and is in agreement with the current care plan.  All questions were answered. The patient knows to call the clinic with any problems, questions or concerns. We can certainly see the patient  much sooner if necessary.  I spent 20 minutes counseling the patient face to face. The total time spent in the appointment was 30 minutes.  Disclaimer: This note was dictated with voice recognition software. Similar sounding words can inadvertently be transcribed and may not be corrected upon review.

## 2014-04-05 NOTE — Telephone Encounter (Signed)
gv adn printed appt sched and avs for pt for Jan and March 2016....sed added tx.

## 2014-04-07 ENCOUNTER — Other Ambulatory Visit: Payer: Self-pay | Admitting: *Deleted

## 2014-04-07 DIAGNOSIS — D72819 Decreased white blood cell count, unspecified: Secondary | ICD-10-CM

## 2014-04-07 DIAGNOSIS — C9000 Multiple myeloma not having achieved remission: Secondary | ICD-10-CM

## 2014-04-07 DIAGNOSIS — Z7901 Long term (current) use of anticoagulants: Secondary | ICD-10-CM

## 2014-04-07 LAB — IGG, IGA, IGM
IgA: 347 mg/dL (ref 69–380)
IgG (Immunoglobin G), Serum: 1640 mg/dL (ref 690–1700)
IgM, Serum: 40 mg/dL — ABNORMAL LOW (ref 52–322)

## 2014-04-07 LAB — KAPPA/LAMBDA LIGHT CHAINS
Kappa free light chain: 7.3 mg/dL — ABNORMAL HIGH (ref 0.33–1.94)
Kappa:Lambda Ratio: 2.03 — ABNORMAL HIGH (ref 0.26–1.65)
Lambda Free Lght Chn: 3.6 mg/dL — ABNORMAL HIGH (ref 0.57–2.63)

## 2014-04-07 LAB — BETA 2 MICROGLOBULIN, SERUM: Beta-2 Microglobulin: 2.27 mg/L (ref <=2.51)

## 2014-04-07 MED ORDER — POTASSIUM CHLORIDE CRYS ER 20 MEQ PO TBCR: 30.0000 meq | EXTENDED_RELEASE_TABLET | Freq: Every day | ORAL | Status: DC

## 2014-04-07 MED ORDER — ALLOPURINOL 300 MG PO TABS: 300.0000 mg | ORAL_TABLET | Freq: Every day | ORAL | Status: DC

## 2014-04-07 MED ORDER — WARFARIN SODIUM 2 MG PO TABS: 2.0000 mg | ORAL_TABLET | Freq: Every day | ORAL | Status: DC

## 2014-04-07 MED ORDER — GABAPENTIN 600 MG PO TABS: ORAL_TABLET | ORAL | Status: DC

## 2014-04-07 MED ORDER — PANTOPRAZOLE SODIUM 40 MG PO TBEC: 40.0000 mg | DELAYED_RELEASE_TABLET | Freq: Every day | ORAL | Status: DC

## 2014-04-07 NOTE — Telephone Encounter (Signed)
Clarified with Dr. Julien Nordmann that she has been reduced to 2 mg coumadin daily. Does not need PT/INR checked at this dose. Notified patient.

## 2014-04-08 ENCOUNTER — Other Ambulatory Visit: Payer: Self-pay | Admitting: *Deleted

## 2014-04-08 DIAGNOSIS — C9000 Multiple myeloma not having achieved remission: Secondary | ICD-10-CM

## 2014-04-08 MED ORDER — LENALIDOMIDE 10 MG PO CAPS: ORAL_CAPSULE | ORAL | Status: DC

## 2014-04-08 NOTE — Telephone Encounter (Addendum)
THIS REFILL REQUEST FOR REVLIMID WAS PLACED ON DR.MOHAMED'S DESK. 

## 2014-04-13 ENCOUNTER — Other Ambulatory Visit: Payer: Self-pay | Admitting: *Deleted

## 2014-04-21 ENCOUNTER — Other Ambulatory Visit: Payer: Self-pay | Admitting: Medical Oncology

## 2014-04-21 DIAGNOSIS — H4011X1 Primary open-angle glaucoma, mild stage: Secondary | ICD-10-CM | POA: Diagnosis not present

## 2014-04-21 DIAGNOSIS — H4011X2 Primary open-angle glaucoma, moderate stage: Secondary | ICD-10-CM | POA: Diagnosis not present

## 2014-05-02 ENCOUNTER — Telehealth: Payer: Self-pay | Admitting: *Deleted

## 2014-05-02 ENCOUNTER — Other Ambulatory Visit: Payer: Self-pay

## 2014-05-02 ENCOUNTER — Other Ambulatory Visit: Payer: Self-pay | Admitting: Medical Oncology

## 2014-05-02 DIAGNOSIS — C9000 Multiple myeloma not having achieved remission: Secondary | ICD-10-CM

## 2014-05-02 MED ORDER — LENALIDOMIDE 10 MG PO CAPS: ORAL_CAPSULE | ORAL | Status: DC

## 2014-05-02 NOTE — Telephone Encounter (Addendum)
CHECK WITH PT. CONCERNING DOSE CHANGE WITH REVLIMID.

## 2014-05-03 ENCOUNTER — Telehealth: Payer: Self-pay | Admitting: Medical Oncology

## 2014-05-03 NOTE — Telephone Encounter (Signed)
Pt thought [facility] wanted her to take revlimid every day without a break and I told her that she needs to take it like she was -2 weeks on and 1 week off. She voices understanding,

## 2014-05-03 NOTE — Telephone Encounter (Signed)
NO NOTE

## 2014-05-19 ENCOUNTER — Other Ambulatory Visit: Payer: Self-pay | Admitting: *Deleted

## 2014-05-19 DIAGNOSIS — C9000 Multiple myeloma not having achieved remission: Secondary | ICD-10-CM

## 2014-05-19 MED ORDER — LENALIDOMIDE 10 MG PO CAPS: ORAL_CAPSULE | ORAL | Status: DC

## 2014-06-09 ENCOUNTER — Telehealth: Payer: Self-pay | Admitting: *Deleted

## 2014-06-09 ENCOUNTER — Other Ambulatory Visit: Payer: Self-pay | Admitting: *Deleted

## 2014-06-09 DIAGNOSIS — C9000 Multiple myeloma not having achieved remission: Secondary | ICD-10-CM

## 2014-06-09 MED ORDER — LENALIDOMIDE 10 MG PO CAPS: ORAL_CAPSULE | ORAL | Status: DC

## 2014-06-09 NOTE — Telephone Encounter (Signed)
Fax received from [facility] for refill on Revlimid 10 mg, reviewed with MD, refill faxed to [phone]. Authorization #[id number]

## 2014-06-22 ENCOUNTER — Other Ambulatory Visit: Payer: Self-pay | Admitting: Internal Medicine

## 2014-06-28 ENCOUNTER — Telehealth: Payer: Self-pay | Admitting: *Deleted

## 2014-06-28 ENCOUNTER — Other Ambulatory Visit (HOSPITAL_BASED_OUTPATIENT_CLINIC_OR_DEPARTMENT_OTHER): Payer: Commercial Managed Care - HMO

## 2014-06-28 ENCOUNTER — Ambulatory Visit (HOSPITAL_BASED_OUTPATIENT_CLINIC_OR_DEPARTMENT_OTHER): Payer: Commercial Managed Care - HMO

## 2014-06-28 ENCOUNTER — Telehealth: Payer: Self-pay | Admitting: Internal Medicine

## 2014-06-28 ENCOUNTER — Ambulatory Visit (HOSPITAL_BASED_OUTPATIENT_CLINIC_OR_DEPARTMENT_OTHER): Payer: Commercial Managed Care - HMO | Admitting: Internal Medicine

## 2014-06-28 ENCOUNTER — Encounter: Payer: Self-pay | Admitting: Internal Medicine

## 2014-06-28 VITALS — BP 139/67 | HR 67 | Temp 97.8°F | Resp 18 | Ht 66.0 in | Wt 172.4 lb

## 2014-06-28 DIAGNOSIS — D72819 Decreased white blood cell count, unspecified: Secondary | ICD-10-CM | POA: Diagnosis not present

## 2014-06-28 DIAGNOSIS — C9000 Multiple myeloma not having achieved remission: Secondary | ICD-10-CM

## 2014-06-28 DIAGNOSIS — Z9484 Stem cells transplant status: Secondary | ICD-10-CM | POA: Diagnosis not present

## 2014-06-28 DIAGNOSIS — Z7901 Long term (current) use of anticoagulants: Secondary | ICD-10-CM | POA: Diagnosis not present

## 2014-06-28 LAB — CBC WITH DIFFERENTIAL/PLATELET
BASO%: 0.8 % (ref 0.0–2.0)
Basophils Absolute: 0 10*3/uL (ref 0.0–0.1)
EOS%: 2.5 % (ref 0.0–7.0)
Eosinophils Absolute: 0.1 10*3/uL (ref 0.0–0.5)
HCT: 35.8 % (ref 34.8–46.6)
HGB: 11.2 g/dL — ABNORMAL LOW (ref 11.6–15.9)
LYMPH%: 45.8 % (ref 14.0–49.7)
MCH: 31.1 pg (ref 25.1–34.0)
MCHC: 31.4 g/dL — ABNORMAL LOW (ref 31.5–36.0)
MCV: 99 fL (ref 79.5–101.0)
MONO#: 0.5 10*3/uL (ref 0.1–0.9)
MONO%: 13 % (ref 0.0–14.0)
NEUT#: 1.6 10*3/uL (ref 1.5–6.5)
NEUT%: 37.9 % — ABNORMAL LOW (ref 38.4–76.8)
Platelets: 195 10*3/uL (ref 145–400)
RBC: 3.61 10*6/uL — ABNORMAL LOW (ref 3.70–5.45)
RDW: 15.1 % — ABNORMAL HIGH (ref 11.2–14.5)
WBC: 4.2 10*3/uL (ref 3.9–10.3)
lymph#: 1.9 10*3/uL (ref 0.9–3.3)

## 2014-06-28 LAB — COMPREHENSIVE METABOLIC PANEL (CC13)
ALT: 20 U/L (ref 0–55)
AST: 16 U/L (ref 5–34)
Albumin: 3.7 g/dL (ref 3.5–5.0)
Alkaline Phosphatase: 60 U/L (ref 40–150)
Anion Gap: 10 mEq/L (ref 3–11)
BUN: 11.2 mg/dL (ref 7.0–26.0)
CO2: 23 mEq/L (ref 22–29)
Calcium: 9.2 mg/dL (ref 8.4–10.4)
Chloride: 110 mEq/L — ABNORMAL HIGH (ref 98–109)
Creatinine: 0.9 mg/dL (ref 0.6–1.1)
EGFR: 70 mL/min/{1.73_m2} — ABNORMAL LOW (ref >90)
Glucose: 98 mg/dl (ref 70–140)
Potassium: 3.8 mEq/L (ref 3.5–5.1)
Sodium: 143 mEq/L (ref 136–145)
Total Bilirubin: 0.39 mg/dL (ref 0.20–1.20)
Total Protein: 7.4 g/dL (ref 6.4–8.3)

## 2014-06-28 LAB — LACTATE DEHYDROGENASE (CC13): LDH: 174 U/L (ref 125–245)

## 2014-06-28 MED ORDER — SODIUM CHLORIDE 0.9 % IV SOLN
Freq: Once | INTRAVENOUS | Status: AC
  Administered 2014-06-28: 11:00:00 via INTRAVENOUS

## 2014-06-28 MED ORDER — ZOLEDRONIC ACID 4 MG/100ML IV SOLN
4.0000 mg | Freq: Once | INTRAVENOUS | Status: AC
  Administered 2014-06-28: 4 mg via INTRAVENOUS
  Filled 2014-06-28: qty 100

## 2014-06-28 NOTE — Telephone Encounter (Signed)
Per staff message and POF I have scheduled appts. Advised scheduler of appts. JMW  

## 2014-06-28 NOTE — Telephone Encounter (Signed)
Gave avs & calendar for June. Sent message to schedule treatment. °

## 2014-06-28 NOTE — Progress Notes (Signed)
[place] Telephone:(336) [phone]   Fax:(336) [phone]  OFFICE PROGRESS NOTE  [name], MD [address] [place] [address]  DIAGNOSIS: Multiple myeloma, kappa light chain disease diagnosed in August 2010.  PRIOR THERAPY: 1) status post systemic chemotherapy with Velcade, Doxil and Decadron between 04/24/2009 through 07/27/2009 discontinued secondary to peripheral neuropathy. 2) status post treatment with Revlimid and Decadron between 09/01/2009 through December 2011. 3) status post peripheral blood autologous stem cell transplant on 04/26/2010 at [facility] under the care of Dr. Miki Kins.   CURRENT THERAPY:  1) Maintenance chemotherapy with Revlimid 10 mg by mouth daily for 14 days every 3 weeks started June 2012. 2) Zometa 4 mg IV every 3 months. 3) prophylactic anticoagulation with Coumadin 6 mg by mouth daily.  INTERVAL HISTORY: Carmen Fisher 74 y.o. female returns to the clinic today for follow-up visit.  The patient is feeling fine today. She continues to have residual peripheral neuropathy from her previous treatment. She denied having any significant fatigue or weakness. She denied having any chest pain, shortness of breath, cough or hemoptysis. She has no fever or chills, no nausea or vomiting. She had repeat myeloma panel performed earlier today and she is here for evaluation and discussion of her lab results. She was supposed to see Dr. Miki Kins for her annual follow-up visit for the stem cell transplant in the next few weeks.  MEDICAL HISTORY: Past Medical History  Diagnosis Date  . Multiple myeloma, without mention of having achieved remission   . Anemia, unspecified   . Hypertension   . Peripheral neuropathy   . Dyslipidemia   . Glaucoma   . Multiple myeloma   . Chronic anticoagulation   . Glaucoma   . Gout   . Osteoporosis   . Hypokalemia   . GERD (gastroesophageal reflux disease)     ALLERGIES:   is allergic to aspirin; aspirin; penicillins; sulfa antibiotics; and sulfa antibiotics.  MEDICATIONS:  Current Outpatient Prescriptions  Medication Sig Dispense Refill  . acetaminophen (TYLENOL) 500 MG tablet Take 500 mg by mouth every 6 (six) hours as needed.      Marland Kitchen allopurinol (ZYLOPRIM) 300 MG tablet Take 1 tablet (300 mg total) by mouth daily. 90 tablet 0  . Alum & Mag Hydroxide-Simeth (MAGIC MOUTHWASH) SOLN Take 5 mLs by mouth as needed.      . cholecalciferol (VITAMIN D) 1000 UNITS tablet Take 1,000 Units by mouth daily.    . Cranberry 250 MG TABS Take 250 mg by mouth 2 (two) times daily.    Marland Kitchen gabapentin (NEURONTIN) 600 MG tablet TAKE ONE TABLET BY MOUTH TWICE DAILY 180 tablet 0  . latanoprost (XALATAN) 0.005 % ophthalmic solution 1 drop at bedtime.      Marland Kitchen lenalidomide (REVLIMID) 10 MG capsule TAKE ONE 10 mg CAPSULE DAILY BY MOUTH FOR 14 DAYS THEN REST 7 Days. 14 capsule 0  . loperamide (IMODIUM) 2 MG capsule Take 1 capsule (2 mg total) by mouth as needed for diarrhea or loose stools. 30 capsule 3  . LORazepam (ATIVAN) 1 MG tablet Take 1 mg by mouth every 4 (four) hours as needed.      . Multiple Vitamin (MULTIVITAMIN WITH MINERALS) TABS tablet Take 1 tablet by mouth daily.    Marland Kitchen oxyCODONE (OXY IR/ROXICODONE) 5 MG immediate release tablet Take 1 tablet (5 mg total) by mouth every 6 (six) hours as needed. 30 tablet 0  . pantoprazole (PROTONIX) 40 MG tablet TAKE  1 TABLET DAILY 90 tablet 0  . potassium chloride SA (K-DUR,KLOR-CON) 20 MEQ tablet Take 1.5 tablets (30 mEq total) by mouth daily. 135 tablet 0  . prochlorperazine (COMPAZINE) 10 MG tablet Take 10 mg by mouth every 6 (six) hours as needed.      . timolol (BETIMOL) 0.5 % ophthalmic solution 1 drop 2 (two) times daily.      Marland Kitchen warfarin (COUMADIN) 2 MG tablet Take 1 tablet (2 mg total) by mouth daily. 90 tablet 0  . zolendronic acid (ZOMETA) 4 MG/5ML injection Inject 4 mg into the vein every 3 (three) months.     No current  facility-administered medications for this visit.    SURGICAL HISTORY:  Past Surgical History  Procedure Laterality Date  . Appendectomy    . Abdominal hysterectomy  1994    w/BSO    REVIEW OF SYSTEMS:  Constitutional: negative Eyes: negative Ears, nose, mouth, throat, and face: negative Respiratory: negative Cardiovascular: negative Gastrointestinal: negative Genitourinary:negative Integument/breast: negative Hematologic/lymphatic: negative Musculoskeletal:negative Neurological: positive for paresthesia Behavioral/Psych: negative Endocrine: negative Allergic/Immunologic: negative   PHYSICAL EXAMINATION: General appearance: alert, cooperative and no distress Head: Normocephalic, without obvious abnormality, atraumatic Neck: no adenopathy, no JVD, supple, symmetrical, trachea midline and thyroid not enlarged, symmetric, no tenderness/mass/nodules Lymph nodes: Cervical, supraclavicular, and axillary nodes normal. Resp: clear to auscultation bilaterally Back: symmetric, no curvature. ROM normal. No CVA tenderness. Cardio: regular rate and rhythm, S1, S2 normal, no murmur, click, rub or gallop GI: soft, non-tender; bowel sounds normal; no masses,  no organomegaly Extremities: extremities normal, atraumatic, no cyanosis or edema Neurologic: Alert and oriented X 3, normal strength and tone. Normal symmetric reflexes. Normal coordination and gait  ECOG PERFORMANCE STATUS: 1 - Symptomatic but completely ambulatory  Blood pressure 139/67, pulse 67, temperature 97.8 F (36.6 C), temperature source Oral, resp. rate 18, height 5' 6"  (1.676 m), weight 172 lb 6.4 oz (78.2 kg), SpO2 99 %.  LABORATORY DATA: Lab Results  Component Value Date   WBC 4.2 06/28/2014   HGB 11.2* 06/28/2014   HCT 35.8 06/28/2014   MCV 99.0 06/28/2014   PLT 195 06/28/2014      Chemistry      Component Value Date/Time   NA 142 04/05/2014 1001   NA 142 12/08/2012 0640   K 3.7 04/05/2014 1001   K 3.0*  12/08/2012 0640   CL 109 12/08/2012 0640   CL 106 07/28/2012 0924   CO2 26 04/05/2014 1001   CO2 23 12/08/2012 0640   BUN 11.1 04/05/2014 1001   BUN 11 12/08/2012 0640   CREATININE 0.9 04/05/2014 1001   CREATININE 0.83 12/08/2012 0640   CREATININE 0.93 10/01/2010 0836      Component Value Date/Time   CALCIUM 9.0 04/05/2014 1001   CALCIUM 7.8* 12/08/2012 0640   ALKPHOS 61 04/05/2014 1001   ALKPHOS 68 12/08/2012 0640   AST 18 04/05/2014 1001   AST 47* 12/08/2012 0640   ALT 19 04/05/2014 1001   ALT 52* 12/08/2012 0640   BILITOT 0.35 04/05/2014 1001   BILITOT 0.4 12/08/2012 0640       RADIOGRAPHIC STUDIES: No results found.  ASSESSMENT AND PLAN: This is a very pleasant 74 years old African-American female with history of multiple myeloma diagnosed in August 2010 status post several regimen including autologous peripheral blood stem cell transplant and has been on maintenance Revlimid 10 mg by mouth daily since June 2012 and tolerating her treatment fairly well. Her myeloma panel is still pending. I recommended for  the patient to continue on her maintenance treatment with Revlimid as a scheduled. I will call her as it is any concerning findings for disease progression in the pending myeloma panel. For the metastatic bone disease, she will continue on Zometa 4 mg IV every 3 months. I strongly encouraged the patient to keep a good dental hygiene and to follow-up with her dentist at regular basis. I also advised the patient to start taking calcium and vitamin D at least twice a day. For the prophylactic anticoagulation, the patient is currently on 2 mg of Coumadin.  I would see her back for follow-up visit in 3 months with repeat myeloma panel if there is no evidence for disease progression on the lab performed today. She was advised to call immediately if she has any concerning symptoms in the interval. The patient voices understanding of current disease status and treatment options and  is in agreement with the current care plan.  All questions were answered. The patient knows to call the clinic with any problems, questions or concerns. We can certainly see the patient much sooner if necessary.  Disclaimer: This note was dictated with voice recognition software. Similar sounding words can inadvertently be transcribed and may not be corrected upon review.

## 2014-06-28 NOTE — Patient Instructions (Signed)

## 2014-06-30 ENCOUNTER — Other Ambulatory Visit: Payer: Self-pay | Admitting: Medical Oncology

## 2014-06-30 DIAGNOSIS — C9000 Multiple myeloma not having achieved remission: Secondary | ICD-10-CM

## 2014-06-30 LAB — BETA 2 MICROGLOBULIN, SERUM: Beta-2 Microglobulin: 2.07 mg/L (ref <=2.51)

## 2014-06-30 LAB — IGG, IGA, IGM
IgA: 325 mg/dL (ref 69–380)
IgG (Immunoglobin G), Serum: 1650 mg/dL (ref 690–1700)
IgM, Serum: 29 mg/dL — ABNORMAL LOW (ref 52–322)

## 2014-06-30 LAB — KAPPA/LAMBDA LIGHT CHAINS
Kappa free light chain: 7.36 mg/dL — ABNORMAL HIGH (ref 0.33–1.94)
Kappa:Lambda Ratio: 1.74 — ABNORMAL HIGH (ref 0.26–1.65)
Lambda Free Lght Chn: 4.23 mg/dL — ABNORMAL HIGH (ref 0.57–2.63)

## 2014-06-30 MED ORDER — LENALIDOMIDE 10 MG PO CAPS: ORAL_CAPSULE | ORAL | Status: DC

## 2014-07-04 ENCOUNTER — Encounter: Payer: Self-pay | Admitting: Internal Medicine

## 2014-07-04 NOTE — Progress Notes (Signed)
Per [facility] advised Revlimid will be shipped 07/05/14 for delivery.

## 2014-07-18 ENCOUNTER — Other Ambulatory Visit: Payer: Self-pay | Admitting: Internal Medicine

## 2014-07-18 DIAGNOSIS — C9000 Multiple myeloma not having achieved remission: Secondary | ICD-10-CM

## 2014-07-18 MED ORDER — LENALIDOMIDE 10 MG PO CAPS: ORAL_CAPSULE | ORAL | Status: DC

## 2014-07-18 NOTE — Telephone Encounter (Signed)
Reviewed refill requests with MD, ok to refill Potassium , neurotin, Allopurinol and Revlimid. [facility] . Revlimid e-scribed and faxed to [facility] [phone] fax [phone]

## 2014-07-19 ENCOUNTER — Encounter: Payer: Self-pay | Admitting: Internal Medicine

## 2014-07-19 NOTE — Progress Notes (Signed)
Per diplomat Revlimid was processed and will be shipped to patient  07/25/14

## 2014-08-12 ENCOUNTER — Telehealth: Payer: Self-pay

## 2014-08-12 ENCOUNTER — Other Ambulatory Visit: Payer: Self-pay | Admitting: *Deleted

## 2014-08-12 DIAGNOSIS — C9000 Multiple myeloma not having achieved remission: Secondary | ICD-10-CM

## 2014-08-12 MED ORDER — LENALIDOMIDE 10 MG PO CAPS: ORAL_CAPSULE | ORAL | Status: DC

## 2014-08-12 NOTE — Telephone Encounter (Signed)
revlimid refill request to Dr Mohamed 

## 2014-08-12 NOTE — Telephone Encounter (Signed)
Refill on Revlimid 10mg  1 po x14 days , rest 7 days faxed and escribed to [facility] [phone]

## 2014-08-15 ENCOUNTER — Encounter: Payer: Self-pay | Admitting: Internal Medicine

## 2014-08-15 NOTE — Progress Notes (Signed)
Per [facility]  Revlimid approved and they are contacting the patient about delivery.

## 2014-08-17 DIAGNOSIS — H4011X1 Primary open-angle glaucoma, mild stage: Secondary | ICD-10-CM | POA: Diagnosis not present

## 2014-08-17 DIAGNOSIS — H349 Unspecified retinal vascular occlusion: Secondary | ICD-10-CM | POA: Diagnosis not present

## 2014-08-25 DIAGNOSIS — C9000 Multiple myeloma not having achieved remission: Secondary | ICD-10-CM | POA: Diagnosis not present

## 2014-08-30 ENCOUNTER — Other Ambulatory Visit: Payer: Self-pay | Admitting: Medical Oncology

## 2014-08-30 DIAGNOSIS — C9000 Multiple myeloma not having achieved remission: Secondary | ICD-10-CM

## 2014-08-30 MED ORDER — LENALIDOMIDE 10 MG PO CAPS: ORAL_CAPSULE | ORAL | Status: DC

## 2014-08-31 ENCOUNTER — Encounter: Payer: Self-pay | Admitting: Internal Medicine

## 2014-08-31 NOTE — Progress Notes (Signed)
Per [facility] revlimid will be shipped 09/01/14

## 2014-09-15 DIAGNOSIS — R197 Diarrhea, unspecified: Secondary | ICD-10-CM | POA: Diagnosis not present

## 2014-09-15 DIAGNOSIS — C9000 Multiple myeloma not having achieved remission: Secondary | ICD-10-CM | POA: Diagnosis not present

## 2014-09-16 ENCOUNTER — Other Ambulatory Visit: Payer: Self-pay | Admitting: Internal Medicine

## 2014-09-19 ENCOUNTER — Telehealth: Payer: Self-pay

## 2014-09-19 NOTE — Telephone Encounter (Signed)
Incoming revlimid refill request to Dr Worthy Flank desk

## 2014-09-22 ENCOUNTER — Encounter: Payer: Self-pay | Admitting: Internal Medicine

## 2014-09-22 NOTE — Progress Notes (Signed)
Per diplomat revlimid will be delivered to patient 08/2414

## 2014-09-23 ENCOUNTER — Telehealth: Payer: Self-pay

## 2014-09-23 ENCOUNTER — Other Ambulatory Visit: Payer: Self-pay | Admitting: *Deleted

## 2014-09-23 DIAGNOSIS — C9000 Multiple myeloma not having achieved remission: Secondary | ICD-10-CM

## 2014-09-23 MED ORDER — LENALIDOMIDE 10 MG PO CAPS: ORAL_CAPSULE | ORAL | Status: DC

## 2014-09-23 NOTE — Telephone Encounter (Signed)
revlimid refill request to Dr Julien Nordmann RN

## 2014-09-23 NOTE — Telephone Encounter (Signed)
Revlimid refilled faxed to [facility] [phone]

## 2014-09-27 ENCOUNTER — Telehealth: Payer: Self-pay | Admitting: Internal Medicine

## 2014-09-27 ENCOUNTER — Other Ambulatory Visit (HOSPITAL_BASED_OUTPATIENT_CLINIC_OR_DEPARTMENT_OTHER): Payer: Commercial Managed Care - HMO

## 2014-09-27 ENCOUNTER — Ambulatory Visit (HOSPITAL_BASED_OUTPATIENT_CLINIC_OR_DEPARTMENT_OTHER): Payer: Commercial Managed Care - HMO | Admitting: Internal Medicine

## 2014-09-27 ENCOUNTER — Ambulatory Visit (HOSPITAL_BASED_OUTPATIENT_CLINIC_OR_DEPARTMENT_OTHER): Payer: Commercial Managed Care - HMO

## 2014-09-27 ENCOUNTER — Encounter: Payer: Self-pay | Admitting: Internal Medicine

## 2014-09-27 VITALS — BP 140/75 | HR 74 | Temp 97.7°F | Resp 18 | Ht 66.0 in | Wt 174.5 lb

## 2014-09-27 DIAGNOSIS — C9000 Multiple myeloma not having achieved remission: Secondary | ICD-10-CM | POA: Diagnosis not present

## 2014-09-27 DIAGNOSIS — G622 Polyneuropathy due to other toxic agents: Secondary | ICD-10-CM | POA: Diagnosis not present

## 2014-09-27 DIAGNOSIS — Z9484 Stem cells transplant status: Secondary | ICD-10-CM

## 2014-09-27 DIAGNOSIS — C7951 Secondary malignant neoplasm of bone: Secondary | ICD-10-CM

## 2014-09-27 LAB — CBC WITH DIFFERENTIAL/PLATELET
BASO%: 1.3 % (ref 0.0–2.0)
Basophils Absolute: 0.1 10*3/uL (ref 0.0–0.1)
EOS%: 1.2 % (ref 0.0–7.0)
Eosinophils Absolute: 0.1 10*3/uL (ref 0.0–0.5)
HCT: 35.9 % (ref 34.8–46.6)
HGB: 11.6 g/dL (ref 11.6–15.9)
LYMPH%: 42.3 % (ref 14.0–49.7)
MCH: 31.8 pg (ref 25.1–34.0)
MCHC: 32.2 g/dL (ref 31.5–36.0)
MCV: 98.9 fL (ref 79.5–101.0)
MONO#: 0.5 10*3/uL (ref 0.1–0.9)
MONO%: 11.4 % (ref 0.0–14.0)
NEUT#: 1.9 10*3/uL (ref 1.5–6.5)
NEUT%: 43.8 % (ref 38.4–76.8)
Platelets: 174 10*3/uL (ref 145–400)
RBC: 3.63 10*6/uL — ABNORMAL LOW (ref 3.70–5.45)
RDW: 15 % — ABNORMAL HIGH (ref 11.2–14.5)
WBC: 4.4 10*3/uL (ref 3.9–10.3)
lymph#: 1.9 10*3/uL (ref 0.9–3.3)

## 2014-09-27 LAB — COMPREHENSIVE METABOLIC PANEL (CC13)
ALT: 14 U/L (ref 0–55)
AST: 16 U/L (ref 5–34)
Albumin: 3.7 g/dL (ref 3.5–5.0)
Alkaline Phosphatase: 62 U/L (ref 40–150)
Anion Gap: 7 mEq/L (ref 3–11)
BUN: 18.5 mg/dL (ref 7.0–26.0)
CO2: 24 mEq/L (ref 22–29)
Calcium: 9.3 mg/dL (ref 8.4–10.4)
Chloride: 110 mEq/L — ABNORMAL HIGH (ref 98–109)
Creatinine: 0.9 mg/dL (ref 0.6–1.1)
EGFR: 76 mL/min/{1.73_m2} — ABNORMAL LOW (ref >90)
Glucose: 110 mg/dl (ref 70–140)
Potassium: 3.8 mEq/L (ref 3.5–5.1)
Sodium: 141 mEq/L (ref 136–145)
Total Bilirubin: 0.38 mg/dL (ref 0.20–1.20)
Total Protein: 7.4 g/dL (ref 6.4–8.3)

## 2014-09-27 LAB — LACTATE DEHYDROGENASE (CC13): LDH: 191 U/L (ref 125–245)

## 2014-09-27 MED ORDER — ZOLEDRONIC ACID 4 MG/100ML IV SOLN
4.0000 mg | Freq: Once | INTRAVENOUS | Status: AC
  Administered 2014-09-27: 4 mg via INTRAVENOUS
  Filled 2014-09-27: qty 100

## 2014-09-27 MED ORDER — SODIUM CHLORIDE 0.9 % IV SOLN
Freq: Once | INTRAVENOUS | Status: AC
  Administered 2014-09-27: 12:00:00 via INTRAVENOUS

## 2014-09-27 NOTE — Patient Instructions (Signed)

## 2014-09-27 NOTE — Telephone Encounter (Signed)
per pof to sch pt appt-gave pt copy of avs °

## 2014-09-27 NOTE — Progress Notes (Signed)
[place] [phone]   [phone]  OFFICE PROGRESS NOTE  [name], MD [address] [place] [address]  DIAGNOSIS: Multiple myeloma, kappa light chain disease diagnosed in August 2010.  PRIOR THERAPY: 1) status post systemic chemotherapy with Velcade, Doxil and Decadron between 04/24/2009 through 07/27/2009 discontinued secondary to peripheral neuropathy. 2) status post treatment with Revlimid and Decadron between 09/01/2009 through December 2011. 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 Miki Kins.   CURRENT THERAPY:  1) Maintenance chemotherapy with Revlimid 10 mg by mouth daily for 14 days every 3 weeks started June 2012. 2) Zometa 4 mg IV every 3 months. 3) prophylactic anticoagulation with Coumadin 2 mg by mouth daily.  INTERVAL HISTORY: Carmen Fisher 74 y.o. female returns to the clinic today for follow-up visit.  The patient is feeling fine today. 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 She also has mild fatigue. She denied having any chest pain, shortness of breath, cough or hemoptysis. She has no fever or chills, no nausea or vomiting. She is tolerating her current maintenance treatment with Revlimid fairly well. She had repeat myeloma panel performed earlier today and she is here for evaluation and discussion of her lab results.  MEDICAL HISTORY: Past Medical History  Diagnosis Date  . Multiple myeloma, without mention of having achieved remission   . Anemia, unspecified   . Hypertension   . Peripheral neuropathy   . Dyslipidemia   . Glaucoma   . Multiple myeloma   . Chronic anticoagulation   . Glaucoma   . Gout   . Osteoporosis   . Hypokalemia   . GERD (gastroesophageal reflux disease)     ALLERGIES:  is allergic to aspirin; aspirin;  penicillins; sulfa antibiotics; and sulfa antibiotics.  MEDICATIONS:  Current Outpatient Prescriptions  Medication Sig Dispense Refill  . acetaminophen (TYLENOL) 500 MG tablet Take 500 mg by mouth every 6 (six) hours as needed.      Marland Kitchen allopurinol (ZYLOPRIM) 300 MG tablet TAKE 1 TABLET EVERY DAY 90 tablet 0  . Alum & Mag Hydroxide-Simeth (MAGIC MOUTHWASH) SOLN Take 5 mLs by mouth as needed.      . cholecalciferol (VITAMIN D) 1000 UNITS tablet Take 1,000 Units by mouth daily.    . Cranberry 250 MG TABS Take 250 mg by mouth 2 (two) times daily.    Marland Kitchen gabapentin (NEURONTIN) 600 MG tablet TAKE 1 TABLET TWICE DAILY 180 tablet 0  . latanoprost (XALATAN) 0.005 % ophthalmic solution 1 drop at bedtime.      Marland Kitchen lenalidomide (REVLIMID) 10 MG capsule TAKE ONE 10 mg CAPSULE DAILY BY MOUTH FOR 14 DAYS THEN REST 7 Days. 14 capsule 0  . loperamide (IMODIUM) 2 MG capsule Take 1 capsule (2 mg total) by mouth as needed for diarrhea or loose stools. 30 capsule 3  . LORazepam (ATIVAN) 1 MG tablet Take 1 mg by mouth every 4 (four) hours as needed.      . Multiple Vitamin (MULTIVITAMIN WITH MINERALS) TABS tablet Take 1 tablet by mouth daily.    Marland Kitchen oxyCODONE (OXY IR/ROXICODONE) 5 MG immediate release tablet Take 1 tablet (5 mg total) by mouth every 6 (six) hours as needed. (Patient not taking: Reported on 06/28/2014) 30 tablet 0  . pantoprazole (PROTONIX) 40 MG tablet TAKE 1 TABLET DAILY 90 tablet 0  .  potassium chloride SA (K-DUR,KLOR-CON) 20 MEQ tablet TAKE 1 AND 1/2 TABLETS DAILY 135 tablet 0  . prochlorperazine (COMPAZINE) 10 MG tablet Take 10 mg by mouth every 6 (six) hours as needed.      . timolol (BETIMOL) 0.5 % ophthalmic solution 1 drop 2 (two) times daily.      Marland Kitchen warfarin (COUMADIN) 2 MG tablet TAKE 1 TABLET EVERY DAY 90 tablet 0  . zolendronic acid (ZOMETA) 4 MG/5ML injection Inject 4 mg into the vein every 3 (three) months.     No current facility-administered medications for this visit.    SURGICAL  HISTORY:  Past Surgical History  Procedure Laterality Date  . Appendectomy    . Abdominal hysterectomy  1994    w/BSO    REVIEW OF SYSTEMS:  A comprehensive review of systems was negative except for: Constitutional: positive for fatigue   PHYSICAL EXAMINATION: General appearance: alert, cooperative and no distress Head: Normocephalic, without obvious abnormality, atraumatic Neck: no adenopathy, no JVD, supple, symmetrical, trachea midline and thyroid not enlarged, symmetric, no tenderness/mass/nodules Lymph nodes: Cervical, supraclavicular, and axillary nodes normal. Resp: clear to auscultation bilaterally Back: symmetric, no curvature. ROM normal. No CVA tenderness. Cardio: regular rate and rhythm, S1, S2 normal, no murmur, click, rub or gallop GI: soft, non-tender; bowel sounds normal; no masses,  no organomegaly Extremities: extremities normal, atraumatic, no cyanosis or edema Neurologic: Alert and oriented X 3, normal strength and tone. Normal symmetric reflexes. Normal coordination and gait  ECOG PERFORMANCE STATUS: 1 - Symptomatic but completely ambulatory  Blood pressure 140/75, pulse 74, temperature 97.7 F (36.5 C), temperature source Oral, resp. rate 18, height 5' 6"  (1.676 m), weight 174 lb 8 oz (79.153 kg), SpO2 100 %.  LABORATORY DATA: Lab Results  Component Value Date   WBC 4.4 09/27/2014   HGB 11.6 09/27/2014   HCT 35.9 09/27/2014   MCV 98.9 09/27/2014   PLT 174 09/27/2014      Chemistry      Component Value Date/Time   NA 141 09/27/2014 1035   NA 142 12/08/2012 0640   K 3.8 09/27/2014 1035   K 3.0* 12/08/2012 0640   CL 109 12/08/2012 0640   CL 106 07/28/2012 0924   CO2 24 09/27/2014 1035   CO2 23 12/08/2012 0640   BUN 18.5 09/27/2014 1035   BUN 11 12/08/2012 0640   CREATININE 0.9 09/27/2014 1035   CREATININE 0.83 12/08/2012 0640   CREATININE 0.93 10/01/2010 0836      Component Value Date/Time   CALCIUM 9.3 09/27/2014 1035   CALCIUM 7.8*  12/08/2012 0640   ALKPHOS 62 09/27/2014 1035   ALKPHOS 68 12/08/2012 0640   AST 16 09/27/2014 1035   AST 47* 12/08/2012 0640   ALT 14 09/27/2014 1035   ALT 52* 12/08/2012 0640   BILITOT 0.38 09/27/2014 1035   BILITOT 0.4 12/08/2012 0640       RADIOGRAPHIC STUDIES: No results found.  ASSESSMENT AND PLAN: This is a very pleasant 74 years old African-American female with history of multiple myeloma diagnosed in August 2010 status post several regimen including autologous peripheral blood stem cell transplant and has been on maintenance Revlimid 10 mg by mouth daily since June 2012 and tolerating her treatment fairly well. Her disease has been stable over the last few years. Her myeloma panel is still pending today. I recommended for the patient to continue on her maintenance treatment with Revlimid as a scheduled. I will call her as it is any concerning findings for  disease progression in the pending myeloma panel. For the metastatic bone disease, she will continue on Zometa 4 mg IV every 3 months. I strongly encouraged the patient to keep a good dental hygiene and to follow-up with her dentist at regular basis. I also advised the patient to start taking calcium and vitamin D at least twice a day. For the prophylactic anticoagulation, the patient is currently on 2 mg of Coumadin.  I would see her back for follow-up visit in 3 months with repeat myeloma panel if there is no evidence for disease progression on the lab performed today. She was advised to call immediately if she has any concerning symptoms in the interval. The patient voices understanding of current disease status and treatment options and is in agreement with the current care plan.  All questions were answered. The patient knows to call the clinic with any problems, questions or concerns. We can certainly see the patient much sooner if necessary.  Disclaimer: This note was dictated with voice recognition software. Similar  sounding words can inadvertently be transcribed and may not be corrected upon review.

## 2014-09-29 LAB — IGG, IGA, IGM
IgA: 322 mg/dL (ref 69–380)
IgG (Immunoglobin G), Serum: 1550 mg/dL (ref 690–1700)
IgM, Serum: 26 mg/dL — ABNORMAL LOW (ref 52–322)

## 2014-09-29 LAB — KAPPA/LAMBDA LIGHT CHAINS
Kappa free light chain: 6.53 mg/dL — ABNORMAL HIGH (ref 0.33–1.94)
Kappa:Lambda Ratio: 2.2 — ABNORMAL HIGH (ref 0.26–1.65)
Lambda Free Lght Chn: 2.97 mg/dL — ABNORMAL HIGH (ref 0.57–2.63)

## 2014-09-29 LAB — BETA 2 MICROGLOBULIN, SERUM: Beta-2 Microglobulin: 1.88 mg/L (ref <=2.51)

## 2014-10-06 ENCOUNTER — Other Ambulatory Visit: Payer: Self-pay | Admitting: Internal Medicine

## 2014-10-12 ENCOUNTER — Telehealth: Payer: Self-pay | Admitting: Medical Oncology

## 2014-10-12 ENCOUNTER — Other Ambulatory Visit: Payer: Self-pay | Admitting: Internal Medicine

## 2014-10-12 ENCOUNTER — Other Ambulatory Visit: Payer: Self-pay | Admitting: Medical Oncology

## 2014-10-12 DIAGNOSIS — C9000 Multiple myeloma not having achieved remission: Secondary | ICD-10-CM

## 2014-10-12 DIAGNOSIS — H349 Unspecified retinal vascular occlusion: Secondary | ICD-10-CM | POA: Diagnosis not present

## 2014-10-12 MED ORDER — LENALIDOMIDE 10 MG PO CAPS: ORAL_CAPSULE | ORAL | Status: DC

## 2014-10-12 NOTE — Telephone Encounter (Signed)
She received her protonix in mail.

## 2014-10-31 ENCOUNTER — Other Ambulatory Visit: Payer: Self-pay | Admitting: Medical Oncology

## 2014-10-31 DIAGNOSIS — C9000 Multiple myeloma not having achieved remission: Secondary | ICD-10-CM

## 2014-10-31 MED ORDER — LENALIDOMIDE 10 MG PO CAPS: ORAL_CAPSULE | ORAL | Status: DC

## 2014-11-02 ENCOUNTER — Encounter: Payer: Self-pay | Admitting: Internal Medicine

## 2014-11-02 NOTE — Progress Notes (Signed)
Per [facility] revlimid 10mg  capsule will be shipped on 11/03/14 to the patient.

## 2014-11-18 ENCOUNTER — Other Ambulatory Visit: Payer: Self-pay | Admitting: Medical Oncology

## 2014-11-22 ENCOUNTER — Other Ambulatory Visit: Payer: Self-pay | Admitting: *Deleted

## 2014-11-22 ENCOUNTER — Encounter: Payer: Self-pay | Admitting: Internal Medicine

## 2014-11-22 DIAGNOSIS — C9000 Multiple myeloma not having achieved remission: Secondary | ICD-10-CM

## 2014-11-22 MED ORDER — LENALIDOMIDE 10 MG PO CAPS: ORAL_CAPSULE | ORAL | Status: DC

## 2014-11-22 NOTE — Progress Notes (Signed)
Per [facility] revlimid will be shipped to patient 11/23/14

## 2014-11-22 NOTE — Progress Notes (Signed)
Per [facility] revlimid request was processed and they will call the patient to set up delivery.

## 2014-12-14 ENCOUNTER — Encounter: Payer: Self-pay | Admitting: Internal Medicine

## 2014-12-14 ENCOUNTER — Other Ambulatory Visit: Payer: Self-pay | Admitting: Medical Oncology

## 2014-12-14 DIAGNOSIS — C9000 Multiple myeloma not having achieved remission: Secondary | ICD-10-CM

## 2014-12-14 MED ORDER — LENALIDOMIDE 10 MG PO CAPS: ORAL_CAPSULE | ORAL | Status: DC

## 2014-12-14 NOTE — Progress Notes (Signed)
Per [facility] revlimid has been processed and they will call the patient to set up delivery

## 2014-12-21 ENCOUNTER — Other Ambulatory Visit (HOSPITAL_BASED_OUTPATIENT_CLINIC_OR_DEPARTMENT_OTHER): Payer: Commercial Managed Care - HMO

## 2014-12-21 DIAGNOSIS — C9000 Multiple myeloma not having achieved remission: Secondary | ICD-10-CM | POA: Diagnosis not present

## 2014-12-21 DIAGNOSIS — C7951 Secondary malignant neoplasm of bone: Secondary | ICD-10-CM | POA: Diagnosis not present

## 2014-12-21 LAB — CBC WITH DIFFERENTIAL/PLATELET
BASO%: 1.3 % (ref 0.0–2.0)
Basophils Absolute: 0 10*3/uL (ref 0.0–0.1)
EOS%: 2.5 % (ref 0.0–7.0)
Eosinophils Absolute: 0.1 10*3/uL (ref 0.0–0.5)
HCT: 36.3 % (ref 34.8–46.6)
HGB: 11.5 g/dL — ABNORMAL LOW (ref 11.6–15.9)
LYMPH%: 42.1 % (ref 14.0–49.7)
MCH: 31.7 pg (ref 25.1–34.0)
MCHC: 31.6 g/dL (ref 31.5–36.0)
MCV: 100.3 fL (ref 79.5–101.0)
MONO#: 0.5 10*3/uL (ref 0.1–0.9)
MONO%: 13.3 % (ref 0.0–14.0)
NEUT#: 1.5 10*3/uL (ref 1.5–6.5)
NEUT%: 40.8 % (ref 38.4–76.8)
Platelets: 181 10*3/uL (ref 145–400)
RBC: 3.62 10*6/uL — ABNORMAL LOW (ref 3.70–5.45)
RDW: 14.7 % — ABNORMAL HIGH (ref 11.2–14.5)
WBC: 3.6 10*3/uL — ABNORMAL LOW (ref 3.9–10.3)
lymph#: 1.5 10*3/uL (ref 0.9–3.3)

## 2014-12-21 LAB — COMPREHENSIVE METABOLIC PANEL (CC13)
ALT: 15 U/L (ref 0–55)
AST: 15 U/L (ref 5–34)
Albumin: 3.8 g/dL (ref 3.5–5.0)
Alkaline Phosphatase: 61 U/L (ref 40–150)
Anion Gap: 6 mEq/L (ref 3–11)
BUN: 16.1 mg/dL (ref 7.0–26.0)
CO2: 28 mEq/L (ref 22–29)
Calcium: 9.3 mg/dL (ref 8.4–10.4)
Chloride: 110 mEq/L — ABNORMAL HIGH (ref 98–109)
Creatinine: 1 mg/dL (ref 0.6–1.1)
EGFR: 62 mL/min/{1.73_m2} — ABNORMAL LOW (ref >90)
Glucose: 98 mg/dl (ref 70–140)
Potassium: 3.8 mEq/L (ref 3.5–5.1)
Sodium: 143 mEq/L (ref 136–145)
Total Bilirubin: 0.41 mg/dL (ref 0.20–1.20)
Total Protein: 7.5 g/dL (ref 6.4–8.3)

## 2014-12-21 LAB — LACTATE DEHYDROGENASE (CC13): LDH: 182 U/L (ref 125–245)

## 2014-12-23 LAB — IGG, IGA, IGM
IgA: 378 mg/dL (ref 69–380)
IgG (Immunoglobin G), Serum: 1780 mg/dL — ABNORMAL HIGH (ref 690–1700)
IgM, Serum: 31 mg/dL — ABNORMAL LOW (ref 52–322)

## 2014-12-23 LAB — KAPPA/LAMBDA LIGHT CHAINS
Kappa free light chain: 5.34 mg/dL — ABNORMAL HIGH (ref 0.33–1.94)
Kappa:Lambda Ratio: 1.84 — ABNORMAL HIGH (ref 0.26–1.65)
Lambda Free Lght Chn: 2.91 mg/dL — ABNORMAL HIGH (ref 0.57–2.63)

## 2014-12-23 LAB — BETA 2 MICROGLOBULIN, SERUM: Beta-2 Microglobulin: 1.89 mg/L (ref <=2.51)

## 2014-12-26 ENCOUNTER — Other Ambulatory Visit: Payer: Self-pay | Admitting: Internal Medicine

## 2014-12-28 ENCOUNTER — Ambulatory Visit (HOSPITAL_BASED_OUTPATIENT_CLINIC_OR_DEPARTMENT_OTHER): Payer: Commercial Managed Care - HMO | Admitting: Internal Medicine

## 2014-12-28 ENCOUNTER — Ambulatory Visit (HOSPITAL_BASED_OUTPATIENT_CLINIC_OR_DEPARTMENT_OTHER): Payer: Commercial Managed Care - HMO

## 2014-12-28 ENCOUNTER — Telehealth: Payer: Self-pay | Admitting: Internal Medicine

## 2014-12-28 ENCOUNTER — Ambulatory Visit: Payer: Commercial Managed Care - HMO

## 2014-12-28 ENCOUNTER — Encounter: Payer: Self-pay | Admitting: Internal Medicine

## 2014-12-28 VITALS — BP 147/58 | HR 73 | Temp 98.2°F | Resp 18 | Ht 66.0 in | Wt 175.5 lb

## 2014-12-28 DIAGNOSIS — C9000 Multiple myeloma not having achieved remission: Secondary | ICD-10-CM

## 2014-12-28 DIAGNOSIS — Z23 Encounter for immunization: Secondary | ICD-10-CM

## 2014-12-28 MED ORDER — ZOLEDRONIC ACID 4 MG/100ML IV SOLN
4.0000 mg | Freq: Once | INTRAVENOUS | Status: AC
  Administered 2014-12-28: 4 mg via INTRAVENOUS
  Filled 2014-12-28: qty 100

## 2014-12-28 MED ORDER — INFLUENZA VAC SPLIT QUAD 0.5 ML IM SUSY
0.5000 mL | PREFILLED_SYRINGE | Freq: Once | INTRAMUSCULAR | Status: AC
  Administered 2014-12-28: 0.5 mL via INTRAMUSCULAR
  Filled 2014-12-28: qty 0.5

## 2014-12-28 MED ORDER — SODIUM CHLORIDE 0.9 % IV SOLN
Freq: Once | INTRAVENOUS | Status: AC
  Administered 2014-12-28: 16:00:00 via INTRAVENOUS

## 2014-12-28 NOTE — Progress Notes (Signed)
[place] Telephone:([phone]   [phone]) [phone]  OFFICE PROGRESS NOTE  [name], MD [address] [place] [address]  DIAGNOSIS: Multiple myeloma, kappa light chain disease diagnosed in August 2010.  PRIOR THERAPY: 1) status post systemic chemotherapy with Velcade, Doxil and Decadron between 04/24/2009 through 07/27/2009 discontinued secondary to peripheral neuropathy. 2) status post treatment with Revlimid and Decadron between 09/01/2009 through December 2011. 3) status post peripheral blood autologous stem cell transplant on 04/26/2010 at [facility] under the care of Dr. Miki Kins.   CURRENT THERAPY:  1) Maintenance chemotherapy with Revlimid 10 mg by mouth daily for 14 days every 3 weeks started June 2012. 2) Zometa 4 mg IV every 3 months. 3) prophylactic anticoagulation with Coumadin 2 mg by mouth daily.  INTERVAL HISTORY: Carmen Fisher 74 y.o. female returns to the clinic today for follow-up visit. The patient is feeling fine today. She continues to have residual peripheral neuropathy from her previous treatment and she is currently on gabapentin and requesting refill of her medication.  She denied having any chest pain, shortness of breath, cough or hemoptysis. She has no fever or chills, no nausea or vomiting. She is tolerating her current maintenance treatment with Revlimid fairly well. She had repeat myeloma panel performed earlier today and she is here for evaluation and discussion of her lab results.  MEDICAL HISTORY: Past Medical History  Diagnosis Date  . Multiple myeloma, without mention of having achieved remission   . Anemia, unspecified   . Hypertension   . Peripheral neuropathy   . Dyslipidemia   . Glaucoma   . Multiple myeloma   . Chronic anticoagulation   . Glaucoma   . Gout   . Osteoporosis   . Hypokalemia   . GERD (gastroesophageal reflux disease)     ALLERGIES:  is allergic to  aspirin; aspirin; penicillins; sulfa antibiotics; and sulfa antibiotics.  MEDICATIONS:  Current Outpatient Prescriptions  Medication Sig Dispense Refill  . acetaminophen (TYLENOL) 500 MG tablet Take 500 mg by mouth every 6 (six) hours as needed.      Marland Kitchen allopurinol (ZYLOPRIM) 300 MG tablet TAKE 1 TABLET EVERY DAY 90 tablet 0  . Alum & Mag Hydroxide-Simeth (MAGIC MOUTHWASH) SOLN Take 5 mLs by mouth as needed.      . cholecalciferol (VITAMIN D) 1000 UNITS tablet Take 1,000 Units by mouth daily.    . Cranberry 250 MG TABS Take 250 mg by mouth 2 (two) times daily.    Marland Kitchen gabapentin (NEURONTIN) 600 MG tablet TAKE 1 TABLET TWICE DAILY 180 tablet 0  . latanoprost (XALATAN) 0.005 % ophthalmic solution 1 drop at bedtime.      Marland Kitchen lenalidomide (REVLIMID) 10 MG capsule TAKE ONE 10 mg CAPSULE DAILY BY MOUTH FOR 14 DAYS THEN REST 7 Days. 14 capsule 0  . loperamide (IMODIUM) 2 MG capsule Take 1 capsule (2 mg total) by mouth as needed for diarrhea or loose stools. 30 capsule 3  . LORazepam (ATIVAN) 1 MG tablet Take 1 mg by mouth every 4 (four) hours as needed.      . Multiple Vitamin (MULTIVITAMIN WITH MINERALS) TABS tablet Take 1 tablet by mouth daily.    Marland Kitchen oxyCODONE (OXY IR/ROXICODONE) 5 MG immediate release tablet Take 1 tablet (5 mg total) by mouth every 6 (six) hours as needed. 30 tablet 0  . pantoprazole (PROTONIX) 40 MG tablet TAKE 1 TABLET DAILY 90 tablet 0  . potassium chloride SA (K-DUR,KLOR-CON) 20  MEQ tablet TAKE 1 AND 1/2 TABLETS DAILY 135 tablet 0  . timolol (BETIMOL) 0.5 % ophthalmic solution 1 drop 2 (two) times daily.      Marland Kitchen warfarin (COUMADIN) 2 MG tablet TAKE 1 TABLET EVERY DAY 90 tablet 0  . zolendronic acid (ZOMETA) 4 MG/5ML injection Inject 4 mg into the vein every 3 (three) months.    . prochlorperazine (COMPAZINE) 10 MG tablet Take 10 mg by mouth every 6 (six) hours as needed.       No current facility-administered medications for this visit.    SURGICAL HISTORY:  Past Surgical  History  Procedure Laterality Date  . Appendectomy    . Abdominal hysterectomy  1994    w/BSO    REVIEW OF SYSTEMS:  A comprehensive review of systems was negative except for: Constitutional: positive for fatigue   PHYSICAL EXAMINATION: General appearance: alert, cooperative and no distress Head: Normocephalic, without obvious abnormality, atraumatic Neck: no adenopathy, no JVD, supple, symmetrical, trachea midline and thyroid not enlarged, symmetric, no tenderness/mass/nodules Lymph nodes: Cervical, supraclavicular, and axillary nodes normal. Resp: clear to auscultation bilaterally Back: symmetric, no curvature. ROM normal. No CVA tenderness. Cardio: regular rate and rhythm, S1, S2 normal, no murmur, click, rub or gallop GI: soft, non-tender; bowel sounds normal; no masses,  no organomegaly Extremities: extremities normal, atraumatic, no cyanosis or edema Neurologic: Alert and oriented X 3, normal strength and tone. Normal symmetric reflexes. Normal coordination and gait  ECOG PERFORMANCE STATUS: 1 - Symptomatic but completely ambulatory  Blood pressure 147/58, pulse 73, temperature 98.2 F (36.8 C), temperature source Oral, resp. rate 18, height 5' 6"  (1.676 m), weight 175 lb 8 oz (79.606 kg), SpO2 100 %.  LABORATORY DATA: Lab Results  Component Value Date   WBC 3.6* 12/21/2014   HGB 11.5* 12/21/2014   HCT 36.3 12/21/2014   MCV 100.3 12/21/2014   PLT 181 12/21/2014      Chemistry      Component Value Date/Time   NA 143 12/21/2014 0853   NA 142 12/08/2012 0640   K 3.8 12/21/2014 0853   K 3.0* 12/08/2012 0640   CL 109 12/08/2012 0640   CL 106 07/28/2012 0924   CO2 28 12/21/2014 0853   CO2 23 12/08/2012 0640   BUN 16.1 12/21/2014 0853   BUN 11 12/08/2012 0640   CREATININE 1.0 12/21/2014 0853   CREATININE 0.83 12/08/2012 0640   CREATININE 0.93 10/01/2010 0836      Component Value Date/Time   CALCIUM 9.3 12/21/2014 0853   CALCIUM 7.8* 12/08/2012 0640   ALKPHOS 61  12/21/2014 0853   ALKPHOS 68 12/08/2012 0640   AST 15 12/21/2014 0853   AST 47* 12/08/2012 0640   ALT 15 12/21/2014 0853   ALT 52* 12/08/2012 0640   BILITOT 0.41 12/21/2014 0853   BILITOT 0.4 12/08/2012 0640     Myeloma panel: Beta-2 microglobulin 1.89, Bilateral chain 5.74, free lambda light chain 2.91, Kappa/lambda ratio 1.84. IgG 17 ACT, IgA 378 and IgM 31.  RADIOGRAPHIC STUDIES: No results found.  ASSESSMENT AND PLAN: This is a very pleasant 74 years old African-American female with history of multiple myeloma diagnosed in August 2010 status post several regimen including autologous peripheral blood stem cell transplant and has been on maintenance Revlimid 10 mg by mouth daily since June 2012 and tolerating her treatment fairly well. Her disease has been stable over the last few years. Her myeloma panel today showed no evidence for disease progression. I discussed the lab result with  the patient. I recommended for the patient to continue on her maintenance treatment with Revlimid as a scheduled.  For the metastatic bone disease, she will continue on Zometa 4 mg IV every 3 months.  For the prophylactic anticoagulation, the patient is currently on 2 mg of Coumadin.  The patient would come back for follow-up visit in 3 months for reevaluation after repeating myeloma panel. She was advised to call immediately if she has any concerning symptoms in the interval. The patient voices understanding of current disease status and treatment options and is in agreement with the current care plan.  All questions were answered. The patient knows to call the clinic with any problems, questions or concerns. We can certainly see the patient much sooner if necessary.  Disclaimer: This note was dictated with voice recognition software. Similar sounding words can inadvertently be transcribed and may not be corrected upon review.

## 2014-12-28 NOTE — Telephone Encounter (Signed)
Gave adn printed appt sched and avs for pt for DEC and March

## 2014-12-28 NOTE — Patient Instructions (Signed)

## 2014-12-30 ENCOUNTER — Telehealth: Payer: Self-pay | Admitting: Internal Medicine

## 2014-12-30 NOTE — Telephone Encounter (Signed)
returned call and lvm for pt confirming DEC and March appts.

## 2015-01-04 ENCOUNTER — Telehealth: Payer: Self-pay

## 2015-01-04 DIAGNOSIS — C9000 Multiple myeloma not having achieved remission: Secondary | ICD-10-CM

## 2015-01-04 MED ORDER — LENALIDOMIDE 10 MG PO CAPS: ORAL_CAPSULE | ORAL | Status: DC

## 2015-01-04 NOTE — Telephone Encounter (Signed)
Done

## 2015-01-04 NOTE — Addendum Note (Signed)
Addended by: Ardeen Garland on: 01/04/2015 11:53 AM   Modules accepted: Orders, Medications

## 2015-01-04 NOTE — Telephone Encounter (Signed)
Carmen Fisher is requesting a prescription to be faxed to them for a Revlimid

## 2015-01-05 ENCOUNTER — Other Ambulatory Visit: Payer: Self-pay | Admitting: Medical Oncology

## 2015-01-05 DIAGNOSIS — C9000 Multiple myeloma not having achieved remission: Secondary | ICD-10-CM

## 2015-01-09 ENCOUNTER — Encounter: Payer: Self-pay | Admitting: Internal Medicine

## 2015-01-09 NOTE — Progress Notes (Signed)
Per [facility] spec phar they have processed and will call the patient to set up delivery.

## 2015-01-18 ENCOUNTER — Other Ambulatory Visit: Payer: Self-pay | Admitting: Internal Medicine

## 2015-01-25 ENCOUNTER — Other Ambulatory Visit: Payer: Self-pay | Admitting: Hematology

## 2015-01-31 ENCOUNTER — Telehealth: Payer: Self-pay | Admitting: *Deleted

## 2015-01-31 ENCOUNTER — Telehealth: Payer: Self-pay | Admitting: Medical Oncology

## 2015-01-31 ENCOUNTER — Other Ambulatory Visit: Payer: Self-pay | Admitting: Medical Oncology

## 2015-01-31 DIAGNOSIS — C9000 Multiple myeloma not having achieved remission: Secondary | ICD-10-CM

## 2015-01-31 MED ORDER — LENALIDOMIDE 10 MG PO CAPS: ORAL_CAPSULE | ORAL | Status: DC

## 2015-01-31 NOTE — Progress Notes (Signed)
Faxed  revlimid refill. 

## 2015-01-31 NOTE — Telephone Encounter (Signed)
Patient called requesting refill of her revlimid to [facility].  Faxed request received in Triage this am and now taken to Abelina Bachelor RN @ Dr. Worthy Flank for refill.

## 2015-01-31 NOTE — Telephone Encounter (Signed)
Faxed refill request received for Loperamide under Dr. Lona Kettle.  New oncologist is Dr. Julien Nordmann.  Marland Kitchen

## 2015-01-31 NOTE — Telephone Encounter (Signed)
I left message to call back regarding reason for loperamide.

## 2015-02-01 ENCOUNTER — Telehealth: Payer: Self-pay | Admitting: Medical Oncology

## 2015-02-01 DIAGNOSIS — R195 Other fecal abnormalities: Secondary | ICD-10-CM

## 2015-02-01 MED ORDER — LOPERAMIDE HCL 2 MG PO CAPS: 2.0000 mg | ORAL_CAPSULE | ORAL | Status: DC | PRN

## 2015-02-01 NOTE — Telephone Encounter (Signed)
I left another message for pt to call back . I called daughter and pt was there. Pt states she has intermittent bouts of loose stools up to 3/day -not every day. She is drinking fluids and eating well without any other complaints. I instructed her to call back if diarrhea worsens.

## 2015-02-08 ENCOUNTER — Other Ambulatory Visit: Payer: Self-pay

## 2015-02-08 DIAGNOSIS — Z1231 Encounter for screening mammogram for malignant neoplasm of breast: Secondary | ICD-10-CM

## 2015-02-16 DIAGNOSIS — H401122 Primary open-angle glaucoma, left eye, moderate stage: Secondary | ICD-10-CM | POA: Diagnosis not present

## 2015-02-16 DIAGNOSIS — H40011 Open angle with borderline findings, low risk, right eye: Secondary | ICD-10-CM | POA: Diagnosis not present

## 2015-02-16 DIAGNOSIS — H349 Unspecified retinal vascular occlusion: Secondary | ICD-10-CM | POA: Diagnosis not present

## 2015-02-17 ENCOUNTER — Other Ambulatory Visit: Payer: Self-pay | Admitting: Medical Oncology

## 2015-02-17 DIAGNOSIS — C9000 Multiple myeloma not having achieved remission: Secondary | ICD-10-CM

## 2015-02-17 MED ORDER — LENALIDOMIDE 10 MG PO CAPS: ORAL_CAPSULE | ORAL | Status: DC

## 2015-02-22 ENCOUNTER — Other Ambulatory Visit: Payer: Self-pay | Admitting: Internal Medicine

## 2015-02-27 DIAGNOSIS — R197 Diarrhea, unspecified: Secondary | ICD-10-CM | POA: Diagnosis not present

## 2015-03-03 DIAGNOSIS — J069 Acute upper respiratory infection, unspecified: Secondary | ICD-10-CM | POA: Diagnosis not present

## 2015-03-03 DIAGNOSIS — J40 Bronchitis, not specified as acute or chronic: Secondary | ICD-10-CM | POA: Diagnosis not present

## 2015-03-03 DIAGNOSIS — R05 Cough: Secondary | ICD-10-CM | POA: Diagnosis not present

## 2015-03-08 ENCOUNTER — Other Ambulatory Visit: Payer: Self-pay | Admitting: Medical Oncology

## 2015-03-08 DIAGNOSIS — C9000 Multiple myeloma not having achieved remission: Secondary | ICD-10-CM

## 2015-03-08 MED ORDER — LENALIDOMIDE 10 MG PO CAPS: ORAL_CAPSULE | ORAL | Status: DC

## 2015-03-09 DIAGNOSIS — J40 Bronchitis, not specified as acute or chronic: Secondary | ICD-10-CM | POA: Diagnosis not present

## 2015-03-09 DIAGNOSIS — J069 Acute upper respiratory infection, unspecified: Secondary | ICD-10-CM | POA: Diagnosis not present

## 2015-03-09 DIAGNOSIS — M1990 Unspecified osteoarthritis, unspecified site: Secondary | ICD-10-CM | POA: Diagnosis not present

## 2015-03-09 DIAGNOSIS — E7800 Pure hypercholesterolemia, unspecified: Secondary | ICD-10-CM | POA: Diagnosis not present

## 2015-03-09 DIAGNOSIS — M436 Torticollis: Secondary | ICD-10-CM | POA: Diagnosis not present

## 2015-03-09 DIAGNOSIS — I10 Essential (primary) hypertension: Secondary | ICD-10-CM | POA: Diagnosis not present

## 2015-03-09 DIAGNOSIS — M25519 Pain in unspecified shoulder: Secondary | ICD-10-CM | POA: Diagnosis not present

## 2015-03-09 DIAGNOSIS — Z7901 Long term (current) use of anticoagulants: Secondary | ICD-10-CM | POA: Diagnosis not present

## 2015-03-09 DIAGNOSIS — R05 Cough: Secondary | ICD-10-CM | POA: Diagnosis not present

## 2015-03-10 ENCOUNTER — Telehealth: Payer: Self-pay | Admitting: Medical Oncology

## 2015-03-10 NOTE — Telephone Encounter (Signed)
Pt had INR today at Dr Pennie Banter and it was  1.0  Gay Filler, RN  asking if [facility] monitors her coumadin , I told her that he did not and that her coumadin 2 mg a was prophylactic dose.  Note to [place].

## 2015-03-14 DIAGNOSIS — Z0000 Encounter for general adult medical examination without abnormal findings: Secondary | ICD-10-CM | POA: Diagnosis not present

## 2015-03-20 ENCOUNTER — Ambulatory Visit
Admission: RE | Admit: 2015-03-20 | Discharge: 2015-03-20 | Disposition: A | Discharge status: Home / Self Care | Payer: Commercial Managed Care - HMO | Source: Ambulatory Visit

## 2015-03-20 ENCOUNTER — Other Ambulatory Visit: Payer: Self-pay | Admitting: Internal Medicine

## 2015-03-20 DIAGNOSIS — Z1231 Encounter for screening mammogram for malignant neoplasm of breast: Secondary | ICD-10-CM

## 2015-03-22 ENCOUNTER — Other Ambulatory Visit (HOSPITAL_BASED_OUTPATIENT_CLINIC_OR_DEPARTMENT_OTHER): Payer: Commercial Managed Care - HMO

## 2015-03-22 ENCOUNTER — Encounter: Payer: Self-pay | Admitting: Internal Medicine

## 2015-03-22 ENCOUNTER — Telehealth: Payer: Self-pay | Admitting: Internal Medicine

## 2015-03-22 ENCOUNTER — Ambulatory Visit (HOSPITAL_BASED_OUTPATIENT_CLINIC_OR_DEPARTMENT_OTHER): Payer: Commercial Managed Care - HMO

## 2015-03-22 ENCOUNTER — Ambulatory Visit (HOSPITAL_BASED_OUTPATIENT_CLINIC_OR_DEPARTMENT_OTHER): Payer: Commercial Managed Care - HMO | Admitting: Internal Medicine

## 2015-03-22 VITALS — BP 141/75 | HR 80 | Temp 98.3°F | Resp 18 | Ht 66.0 in | Wt 166.8 lb

## 2015-03-22 DIAGNOSIS — Z9484 Stem cells transplant status: Secondary | ICD-10-CM

## 2015-03-22 DIAGNOSIS — C9000 Multiple myeloma not having achieved remission: Secondary | ICD-10-CM | POA: Diagnosis not present

## 2015-03-22 LAB — COMPREHENSIVE METABOLIC PANEL
ALT: 43 U/L (ref 0–55)
AST: 29 U/L (ref 5–34)
Albumin: 3.6 g/dL (ref 3.5–5.0)
Alkaline Phosphatase: 82 U/L (ref 40–150)
Anion Gap: 8 mEq/L (ref 3–11)
BUN: 15.9 mg/dL (ref 7.0–26.0)
CO2: 26 mEq/L (ref 22–29)
Calcium: 9.7 mg/dL (ref 8.4–10.4)
Chloride: 106 mEq/L (ref 98–109)
Creatinine: 1 mg/dL (ref 0.6–1.1)
EGFR: 63 mL/min/{1.73_m2} — ABNORMAL LOW (ref >90)
Glucose: 89 mg/dl (ref 70–140)
Potassium: 4.2 mEq/L (ref 3.5–5.1)
Sodium: 140 mEq/L (ref 136–145)
Total Bilirubin: <0.3 mg/dL (ref 0.20–1.20)
Total Protein: 7.7 g/dL (ref 6.4–8.3)

## 2015-03-22 LAB — CBC WITH DIFFERENTIAL/PLATELET
BASO%: 0.2 % (ref 0.0–2.0)
Basophils Absolute: 0 10*3/uL (ref 0.0–0.1)
EOS%: 2.5 % (ref 0.0–7.0)
Eosinophils Absolute: 0.1 10*3/uL (ref 0.0–0.5)
HCT: 36.2 % (ref 34.8–46.6)
HGB: 11.5 g/dL — ABNORMAL LOW (ref 11.6–15.9)
LYMPH%: 43.7 % (ref 14.0–49.7)
MCH: 31.3 pg (ref 25.1–34.0)
MCHC: 31.8 g/dL (ref 31.5–36.0)
MCV: 98.6 fL (ref 79.5–101.0)
MONO#: 0.3 10*3/uL (ref 0.1–0.9)
MONO%: 6.9 % (ref 0.0–14.0)
NEUT#: 1.9 10*3/uL (ref 1.5–6.5)
NEUT%: 46.7 % (ref 38.4–76.8)
Platelets: 269 10*3/uL (ref 145–400)
RBC: 3.67 10*6/uL — ABNORMAL LOW (ref 3.70–5.45)
RDW: 14.2 % (ref 11.2–14.5)
WBC: 4 10*3/uL (ref 3.9–10.3)
lymph#: 1.8 10*3/uL (ref 0.9–3.3)

## 2015-03-22 LAB — LACTATE DEHYDROGENASE: LDH: 193 U/L (ref 125–245)

## 2015-03-22 MED ORDER — SODIUM CHLORIDE 0.9 % IV SOLN
Freq: Once | INTRAVENOUS | Status: AC
  Administered 2015-03-22: 13:00:00 via INTRAVENOUS

## 2015-03-22 MED ORDER — ZOLEDRONIC ACID 4 MG/100ML IV SOLN
4.0000 mg | Freq: Once | INTRAVENOUS | Status: AC
  Administered 2015-03-22: 4 mg via INTRAVENOUS
  Filled 2015-03-22: qty 100

## 2015-03-22 NOTE — Progress Notes (Signed)
[place] [phone]   [phone]  OFFICE PROGRESS NOTE  [name], MD [address] [place] [address]  DIAGNOSIS: Multiple myeloma, kappa light chain disease diagnosed in August 2010.  PRIOR THERAPY: 1) status post systemic chemotherapy with Velcade, Doxil and Decadron between 04/24/2009 through 07/27/2009 discontinued secondary to peripheral neuropathy. 2) status post treatment with Revlimid and Decadron between 09/01/2009 through December 2011. 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 Miki Kins.   CURRENT THERAPY:  1) Maintenance chemotherapy with Revlimid 10 mg by mouth daily for 14 days every 3 weeks started June 2012. 2) Zometa 4 mg IV every 3 months. 3) prophylactic anticoagulation with Coumadin 2 mg by mouth daily.  INTERVAL HISTORY: Carmen Fisher 74 y.o. female returns to the clinic today for follow-up visit. The patient is feeling fine today. She denied having any chest pain, shortness of breath, cough or hemoptysis. She has no fever or chills, no nausea or vomiting. She has no significant weight loss or night sweats. She is tolerating her current maintenance treatment with Revlimid fairly well. She had repeat myeloma panel performed earlier today and she is here for evaluation and discussion of her lab results.  MEDICAL HISTORY: Past Medical History  Diagnosis Date  . Multiple myeloma, without mention of having achieved remission   . Anemia, unspecified   . Hypertension   . Peripheral neuropathy ([place])   . Dyslipidemia   . Glaucoma   . Multiple myeloma ([place])   . Chronic anticoagulation   . Glaucoma   . Gout   . Osteoporosis   . Hypokalemia   . GERD (gastroesophageal reflux disease)     ALLERGIES:  is allergic to aspirin; aspirin; penicillins; sulfa antibiotics; and sulfa antibiotics.  MEDICATIONS:  Current  Outpatient Prescriptions  Medication Sig Dispense Refill  . acetaminophen (TYLENOL) 500 MG tablet Take 500 mg by mouth every 6 (six) hours as needed.      Marland Kitchen allopurinol (ZYLOPRIM) 300 MG tablet TAKE 1 TABLET EVERY DAY 90 tablet 0  . Alum & Mag Hydroxide-Simeth (MAGIC MOUTHWASH) SOLN Take 5 mLs by mouth as needed.      . cholecalciferol (VITAMIN D) 1000 UNITS tablet Take 1,000 Units by mouth daily.    . Cranberry 250 MG TABS Take 250 mg by mouth 2 (two) times daily.    Marland Kitchen latanoprost (XALATAN) 0.005 % ophthalmic solution 1 drop at bedtime.      Marland Kitchen lenalidomide (REVLIMID) 10 MG capsule TAKE ONE 10 mg CAPSULE DAILY BY MOUTH FOR 14 DAYS THEN REST 7 Days. 14 capsule 0  . loperamide (IMODIUM) 2 MG capsule Take 1 capsule (2 mg total) by mouth as needed for diarrhea or loose stools. 30 capsule 0  . methocarbamol (ROBAXIN) 500 MG tablet Take 500 mg by mouth 4 (four) times daily as needed.    . Multiple Vitamin (MULTIVITAMIN WITH MINERALS) TABS tablet Take 1 tablet by mouth daily.    . pantoprazole (PROTONIX) 40 MG tablet TAKE 1 TABLET DAILY 90 tablet 0  . potassium chloride SA (K-DUR,KLOR-CON) 20 MEQ tablet TAKE 1 AND 1/2 TABLETS DAILY 135 tablet 0  . warfarin (COUMADIN) 2 MG tablet TAKE 1 TABLET EVERY DAY 90 tablet 0  . zolendronic acid (ZOMETA) 4 MG/5ML injection Inject 4 mg into the vein every 3 (three) months.    . gabapentin (NEURONTIN) 600 MG tablet TAKE 1 TABLET TWICE  DAILY 180 tablet 0  . LORazepam (ATIVAN) 1 MG tablet Take 1 mg by mouth every 4 (four) hours as needed. Reported on 03/22/2015    . oxyCODONE (OXY IR/ROXICODONE) 5 MG immediate release tablet Take 1 tablet (5 mg total) by mouth every 6 (six) hours as needed. (Patient not taking: Reported on 03/22/2015) 30 tablet 0  . prochlorperazine (COMPAZINE) 10 MG tablet Take 10 mg by mouth every 6 (six) hours as needed. Reported on 03/22/2015    . timolol (BETIMOL) 0.5 % ophthalmic solution 1 drop 2 (two) times daily.       No current  facility-administered medications for this visit.    SURGICAL HISTORY:  Past Surgical History  Procedure Laterality Date  . Appendectomy    . Abdominal hysterectomy  1994    w/BSO    REVIEW OF SYSTEMS:  A comprehensive review of systems was negative except for: Constitutional: positive for fatigue   PHYSICAL EXAMINATION: General appearance: alert, cooperative and no distress Head: Normocephalic, without obvious abnormality, atraumatic Neck: no adenopathy, no JVD, supple, symmetrical, trachea midline and thyroid not enlarged, symmetric, no tenderness/mass/nodules Lymph nodes: Cervical, supraclavicular, and axillary nodes normal. Resp: clear to auscultation bilaterally Back: symmetric, no curvature. ROM normal. No CVA tenderness. Cardio: regular rate and rhythm, S1, S2 normal, no murmur, click, rub or gallop GI: soft, non-tender; bowel sounds normal; no masses,  no organomegaly Extremities: extremities normal, atraumatic, no cyanosis or edema Neurologic: Alert and oriented X 3, normal strength and tone. Normal symmetric reflexes. Normal coordination and gait  ECOG PERFORMANCE STATUS: 1 - Symptomatic but completely ambulatory  Blood pressure 141/75, pulse 80, temperature 98.3 F (36.8 C), temperature source Oral, resp. rate 18, height 5' 6"  (1.676 m), weight 166 lb 12.8 oz (75.66 kg), SpO2 99 %.  LABORATORY DATA: Lab Results  Component Value Date   WBC 4.0 03/22/2015   HGB 11.5* 03/22/2015   HCT 36.2 03/22/2015   MCV 98.6 03/22/2015   PLT 269 03/22/2015      Chemistry      Component Value Date/Time   NA 140 03/22/2015 1024   NA 142 12/08/2012 0640   K 4.2 03/22/2015 1024   K 3.0* 12/08/2012 0640   CL 109 12/08/2012 0640   CL 106 07/28/2012 0924   CO2 26 03/22/2015 1024   CO2 23 12/08/2012 0640   BUN 15.9 03/22/2015 1024   BUN 11 12/08/2012 0640   CREATININE 1.0 03/22/2015 1024   CREATININE 0.83 12/08/2012 0640   CREATININE 0.93 10/01/2010 0836      Component Value  Date/Time   CALCIUM 9.7 03/22/2015 1024   CALCIUM 7.8* 12/08/2012 0640   ALKPHOS 82 03/22/2015 1024   ALKPHOS 68 12/08/2012 0640   AST 29 03/22/2015 1024   AST 47* 12/08/2012 0640   ALT 43 03/22/2015 1024   ALT 52* 12/08/2012 0640   BILITOT <0.30 03/22/2015 1024   BILITOT 0.4 12/08/2012 0640     Myeloma panel: Still pending.  RADIOGRAPHIC STUDIES: Mm Digital Screening Bilateral  03/20/2015  CLINICAL DATA:  Screening. EXAM: DIGITAL SCREENING BILATERAL MAMMOGRAM WITH CAD COMPARISON:  Previous exam(s). ACR Breast Density Category b: There are scattered areas of fibroglandular density. FINDINGS: There are no findings suspicious for malignancy. Images were processed with CAD. IMPRESSION: No mammographic evidence of malignancy. A result letter of this screening mammogram will be mailed directly to the patient. RECOMMENDATION: Screening mammogram in one year. (Code:SM-B-01Y) BI-RADS CATEGORY  1: Negative. Electronically Signed   By: Dedra Skeens.D.  On: 03/20/2015 12:44    ASSESSMENT AND PLAN: This is a very pleasant 74 years old African-American female with history of multiple myeloma diagnosed in August 2010 status post several regimen including autologous peripheral blood stem cell transplant and has been on maintenance Revlimid 10 mg by mouth daily since June 2012 and tolerating her treatment fairly well. Her disease has been stable over the last few years. Her myeloma panel performed earlier today is still pending. I will call the patient with the result of that is any concerning progression. I recommended for the patient to continue on her maintenance treatment with Revlimid as a scheduled.  For the metastatic bone disease, she will continue on Zometa 4 mg IV every 3 months.  For the prophylactic anticoagulation, the patient is currently on 2 mg of Coumadin.  The patient would come back for follow-up visit in 3 months for reevaluation after repeating myeloma panel. She was advised to  call immediately if she has any concerning symptoms in the interval. The patient voices understanding of current disease status and treatment options and is in agreement with the current care plan.  All questions were answered. The patient knows to call the clinic with any problems, questions or concerns. We can certainly see the patient much sooner if necessary.  Disclaimer: This note was dictated with voice recognition software. Similar sounding words can inadvertently be transcribed and may not be corrected upon review.

## 2015-03-22 NOTE — Telephone Encounter (Signed)
per pof to sch pt appt-pt sch already sch-pt to get avs b4 leaving trmt room

## 2015-03-22 NOTE — Patient Instructions (Signed)

## 2015-03-28 LAB — IGG, IGA, IGM
IgA: 362 mg/dL (ref 69–380)
IgG (Immunoglobin G), Serum: 1580 mg/dL (ref 690–1700)
IgM, Serum: 29 mg/dL — ABNORMAL LOW (ref 52–322)

## 2015-03-28 LAB — KAPPA/LAMBDA LIGHT CHAINS
Kappa free light chain: 5.29 mg/dL — ABNORMAL HIGH (ref 0.33–1.94)
Kappa:Lambda Ratio: 1.85 — ABNORMAL HIGH (ref 0.26–1.65)
Lambda Free Lght Chn: 2.86 mg/dL — ABNORMAL HIGH (ref 0.57–2.63)

## 2015-03-28 LAB — BETA 2 MICROGLOBULIN, SERUM: Beta-2 Microglobulin: 1.59 mg/L (ref <=2.51)

## 2015-04-05 ENCOUNTER — Other Ambulatory Visit: Payer: Self-pay | Admitting: Medical Oncology

## 2015-04-05 ENCOUNTER — Telehealth: Payer: Self-pay | Admitting: Medical Oncology

## 2015-04-05 DIAGNOSIS — C9000 Multiple myeloma not having achieved remission: Secondary | ICD-10-CM

## 2015-04-05 MED ORDER — LENALIDOMIDE 10 MG PO CAPS: ORAL_CAPSULE | ORAL | Status: DC

## 2015-04-05 NOTE — Telephone Encounter (Signed)
I left a message that pt revlimid was sent to [facility].

## 2015-04-06 ENCOUNTER — Encounter: Payer: Self-pay | Admitting: Internal Medicine

## 2015-04-06 ENCOUNTER — Other Ambulatory Visit: Payer: Self-pay | Admitting: *Deleted

## 2015-04-06 NOTE — Progress Notes (Signed)
Per [facility] revlimid has been processed and they will get with patient about delivery.

## 2015-04-20 DIAGNOSIS — E0590 Thyrotoxicosis, unspecified without thyrotoxic crisis or storm: Secondary | ICD-10-CM | POA: Diagnosis not present

## 2015-04-25 ENCOUNTER — Other Ambulatory Visit: Payer: Self-pay | Admitting: Medical Oncology

## 2015-04-25 ENCOUNTER — Other Ambulatory Visit: Payer: Self-pay | Admitting: Internal Medicine

## 2015-04-25 DIAGNOSIS — C9000 Multiple myeloma not having achieved remission: Secondary | ICD-10-CM

## 2015-04-25 MED ORDER — LENALIDOMIDE 10 MG PO CAPS: ORAL_CAPSULE | ORAL | Status: DC

## 2015-04-25 NOTE — Progress Notes (Signed)
Faxed to [facility] 

## 2015-04-28 ENCOUNTER — Telehealth: Payer: Self-pay | Admitting: *Deleted

## 2015-04-28 NOTE — Telephone Encounter (Signed)
Call from [facility], regarding Revlimid 10mg .  This Rx is a 28 day supply, pt has informed she is unsure how to take as it was previously 21 day supply. Reviewed with MD, instructed Pharmacy to fill Revlimid 10mg  28 day supply as pt will be on maintenance therapy, she will take this medication daily. Pharmacy rep. Verbalized understanding. Called pt to give above instructions. Pt verbalized understanding and she confirmed she will take Revlimid 10 mg daily. No further concerns.

## 2015-05-01 ENCOUNTER — Encounter: Payer: Self-pay | Admitting: Internal Medicine

## 2015-05-01 NOTE — Progress Notes (Signed)
Per [facility] revlimid will be delivered 05/01/15 with no copay.

## 2015-05-24 ENCOUNTER — Other Ambulatory Visit: Payer: Self-pay | Admitting: Medical Oncology

## 2015-05-24 DIAGNOSIS — C9000 Multiple myeloma not having achieved remission: Secondary | ICD-10-CM

## 2015-05-24 MED ORDER — LENALIDOMIDE 10 MG PO CAPS: ORAL_CAPSULE | ORAL | Status: DC

## 2015-05-29 ENCOUNTER — Encounter: Payer: Self-pay | Admitting: Internal Medicine

## 2015-05-29 ENCOUNTER — Telehealth: Payer: Self-pay | Admitting: Medical Oncology

## 2015-05-29 NOTE — Progress Notes (Signed)
Per [facility] revlimid shipped 05/27/15

## 2015-05-29 NOTE — Telephone Encounter (Signed)
Refill in process.

## 2015-06-07 ENCOUNTER — Other Ambulatory Visit (HOSPITAL_BASED_OUTPATIENT_CLINIC_OR_DEPARTMENT_OTHER): Payer: Commercial Managed Care - HMO

## 2015-06-07 DIAGNOSIS — C9000 Multiple myeloma not having achieved remission: Secondary | ICD-10-CM | POA: Diagnosis not present

## 2015-06-07 LAB — CBC WITH DIFFERENTIAL/PLATELET
BASO%: 0.8 % (ref 0.0–2.0)
Basophils Absolute: 0 10*3/uL (ref 0.0–0.1)
EOS%: 1.4 % (ref 0.0–7.0)
Eosinophils Absolute: 0.1 10*3/uL (ref 0.0–0.5)
HCT: 38.5 % (ref 34.8–46.6)
HGB: 12.2 g/dL (ref 11.6–15.9)
LYMPH%: 36 % (ref 14.0–49.7)
MCH: 31.8 pg (ref 25.1–34.0)
MCHC: 31.7 g/dL (ref 31.5–36.0)
MCV: 100.3 fL (ref 79.5–101.0)
MONO#: 0.5 10*3/uL (ref 0.1–0.9)
MONO%: 9.2 % (ref 0.0–14.0)
NEUT#: 2.6 10*3/uL (ref 1.5–6.5)
NEUT%: 52.6 % (ref 38.4–76.8)
Platelets: 203 10*3/uL (ref 145–400)
RBC: 3.84 10*6/uL (ref 3.70–5.45)
RDW: 15.9 % — ABNORMAL HIGH (ref 11.2–14.5)
WBC: 4.9 10*3/uL (ref 3.9–10.3)
lymph#: 1.8 10*3/uL (ref 0.9–3.3)

## 2015-06-07 LAB — COMPREHENSIVE METABOLIC PANEL
ALT: 30 U/L (ref 0–55)
AST: 23 U/L (ref 5–34)
Albumin: 3.8 g/dL (ref 3.5–5.0)
Alkaline Phosphatase: 76 U/L (ref 40–150)
Anion Gap: 9 mEq/L (ref 3–11)
BUN: 14.7 mg/dL (ref 7.0–26.0)
CO2: 27 mEq/L (ref 22–29)
Calcium: 9.6 mg/dL (ref 8.4–10.4)
Chloride: 107 mEq/L (ref 98–109)
Creatinine: 1 mg/dL (ref 0.6–1.1)
EGFR: 63 mL/min/{1.73_m2} — ABNORMAL LOW (ref >90)
Glucose: 107 mg/dl (ref 70–140)
Potassium: 3.9 mEq/L (ref 3.5–5.1)
Sodium: 142 mEq/L (ref 136–145)
Total Bilirubin: 0.53 mg/dL (ref 0.20–1.20)
Total Protein: 7.7 g/dL (ref 6.4–8.3)

## 2015-06-07 LAB — LACTATE DEHYDROGENASE: LDH: 203 U/L (ref 125–245)

## 2015-06-08 LAB — KAPPA/LAMBDA LIGHT CHAINS
Ig Kappa Free Light Chain: 63.69 mg/L — ABNORMAL HIGH (ref 3.30–19.40)
Ig Lambda Free Light Chain: 35.1 mg/L — ABNORMAL HIGH (ref 5.71–26.30)
Kappa/Lambda FluidC Ratio: 1.81 — ABNORMAL HIGH (ref 0.26–1.65)

## 2015-06-08 LAB — BETA 2 MICROGLOBULIN, SERUM: Beta-2: 1.6 mg/L (ref 0.6–2.4)

## 2015-06-09 ENCOUNTER — Encounter: Payer: Self-pay | Admitting: Internal Medicine

## 2015-06-09 LAB — MULTIPLE MYELOMA PANEL, SERUM
Albumin SerPl Elph-Mcnc: 3.9 g/dL (ref 2.9–4.4)
Albumin/Glob SerPl: 1.2 (ref 0.7–1.7)
Alpha 1: 0.2 g/dL (ref 0.0–0.4)
Alpha2 Glob SerPl Elph-Mcnc: 0.7 g/dL (ref 0.4–1.0)
B-Globulin SerPl Elph-Mcnc: 1.3 g/dL (ref 0.7–1.3)
Gamma Glob SerPl Elph-Mcnc: 1.4 g/dL (ref 0.4–1.8)
Globulin, Total: 3.5 g/dL (ref 2.2–3.9)
IgA, Qn, Serum: 385 mg/dL (ref 64–422)
IgG, Qn, Serum: 1436 mg/dL (ref 700–1600)
IgM, Qn, Serum: 32 mg/dL (ref 26–217)
Total Protein: 7.4 g/dL (ref 6.0–8.5)

## 2015-06-09 NOTE — Progress Notes (Signed)
I faxed refill to [facility] for revlimid

## 2015-06-14 ENCOUNTER — Telehealth: Payer: Self-pay | Admitting: Internal Medicine

## 2015-06-14 ENCOUNTER — Ambulatory Visit (HOSPITAL_BASED_OUTPATIENT_CLINIC_OR_DEPARTMENT_OTHER): Payer: Commercial Managed Care - HMO

## 2015-06-14 ENCOUNTER — Encounter: Payer: Self-pay | Admitting: Internal Medicine

## 2015-06-14 ENCOUNTER — Telehealth: Payer: Self-pay | Admitting: *Deleted

## 2015-06-14 ENCOUNTER — Other Ambulatory Visit: Payer: Commercial Managed Care - HMO

## 2015-06-14 ENCOUNTER — Ambulatory Visit (HOSPITAL_BASED_OUTPATIENT_CLINIC_OR_DEPARTMENT_OTHER): Payer: Commercial Managed Care - HMO | Admitting: Internal Medicine

## 2015-06-14 ENCOUNTER — Other Ambulatory Visit: Payer: Self-pay | Admitting: Internal Medicine

## 2015-06-14 VITALS — BP 135/73 | HR 68 | Temp 98.0°F | Resp 18 | Ht 66.0 in | Wt 169.3 lb

## 2015-06-14 DIAGNOSIS — Z9484 Stem cells transplant status: Secondary | ICD-10-CM | POA: Diagnosis not present

## 2015-06-14 DIAGNOSIS — C9000 Multiple myeloma not having achieved remission: Secondary | ICD-10-CM | POA: Diagnosis not present

## 2015-06-14 DIAGNOSIS — R197 Diarrhea, unspecified: Secondary | ICD-10-CM

## 2015-06-14 DIAGNOSIS — C7951 Secondary malignant neoplasm of bone: Secondary | ICD-10-CM

## 2015-06-14 MED ORDER — ZOLEDRONIC ACID 4 MG/100ML IV SOLN
4.0000 mg | Freq: Once | INTRAVENOUS | Status: AC
  Administered 2015-06-14: 4 mg via INTRAVENOUS
  Filled 2015-06-14: qty 100

## 2015-06-14 MED ORDER — SODIUM CHLORIDE 0.9 % IV SOLN
Freq: Once | INTRAVENOUS | Status: AC
  Administered 2015-06-14: 12:00:00 via INTRAVENOUS

## 2015-06-14 NOTE — Telephone Encounter (Signed)
per pof to sch appt-gave pt copy of avs °

## 2015-06-14 NOTE — Telephone Encounter (Signed)
Per staff message and POF I have scheduled appts. Advised scheduler of appts. JMW  

## 2015-06-14 NOTE — Progress Notes (Signed)
[place] [phone]   [phone]  OFFICE PROGRESS NOTE  [name], MD [address] [place] [address]  DIAGNOSIS: Multiple myeloma, kappa light chain disease diagnosed in August 2010.  PRIOR THERAPY: 1) status post systemic chemotherapy with Velcade, Doxil and Decadron between 04/24/2009 through 07/27/2009 discontinued secondary to peripheral neuropathy. 2) status post treatment with Revlimid and Decadron between 09/01/2009 through December 2011. 3) status post peripheral blood autologous stem cell transplant on 04/26/2010 at [facility] under the care of Dr. Miki Kins.   CURRENT THERAPY:  1) Maintenance chemotherapy with Revlimid 10 mg by mouth daily started June 2012. 2) Zometa 4 mg IV every 3 months. 3) prophylactic anticoagulation with Coumadin 2 mg by mouth daily.  INTERVAL HISTORY: CARLEN Carmen Fisher 75 y.o. female returns to the clinic today for follow-up visit. The patient is feeling fine today. She denied having any chest pain, shortness of breath, cough or hemoptysis. She has no fever or chills, no nausea or vomiting. She has no significant weight loss or night sweats. She is tolerating her current maintenance treatment with Revlimid fairly well except for 2-3 episodes of diarrhea on daily basis depending on the meals. She takes Imodium as needed. She had repeat myeloma panel performed earlier today and she is here for evaluation and discussion of her lab results.  MEDICAL HISTORY: Past Medical History  Diagnosis Date  . Multiple myeloma, without mention of having achieved remission   . Anemia, unspecified   . Hypertension   . Peripheral neuropathy ([place])   . Dyslipidemia   . Glaucoma   . Multiple myeloma ([place])   . Chronic anticoagulation   . Glaucoma   . Gout   . Osteoporosis   . Hypokalemia   . GERD (gastroesophageal reflux disease)     ALLERGIES:  is allergic to aspirin; aspirin;  penicillins; sulfa antibiotics; and sulfa antibiotics.  MEDICATIONS:  Current Outpatient Prescriptions  Medication Sig Dispense Refill  . acetaminophen (TYLENOL) 500 MG tablet Take 500 mg by mouth every 6 (six) hours as needed.      Marland Kitchen allopurinol (ZYLOPRIM) 300 MG tablet TAKE 1 TABLET EVERY DAY 90 tablet 0  . Alum & Mag Hydroxide-Simeth (MAGIC MOUTHWASH) SOLN Take 5 mLs by mouth as needed.      . cholecalciferol (VITAMIN D) 1000 UNITS tablet Take 1,000 Units by mouth daily.    . Cranberry 250 MG TABS Take 250 mg by mouth 2 (two) times daily.    Marland Kitchen gabapentin (NEURONTIN) 600 MG tablet TAKE 1 TABLET TWICE DAILY 180 tablet 0  . latanoprost (XALATAN) 0.005 % ophthalmic solution 1 drop at bedtime.      Marland Kitchen lenalidomide (REVLIMID) 10 MG capsule TAKE ONE 10 mg CAPSULE DAILY BY MOUTH FOR 28 days 28 capsule 0  . loperamide (IMODIUM) 2 MG capsule TAKE ONE CAPSULE BY MOUTH AS NEEDED FOR DIARRHEA OR LOOSE STOOLS. 30 capsule 0  . LORazepam (ATIVAN) 1 MG tablet Take 1 mg by mouth every 4 (four) hours as needed. Reported on 03/22/2015    . methocarbamol (ROBAXIN) 500 MG tablet Take 500 mg by mouth 4 (four) times daily as needed.    . Multiple Vitamin (MULTIVITAMIN WITH MINERALS) TABS tablet Take 1 tablet by mouth daily.    Marland Kitchen oxyCODONE (OXY IR/ROXICODONE) 5 MG immediate release tablet Take 1 tablet (5 mg total) by mouth every 6 (six) hours as needed. 30 tablet 0  . pantoprazole (PROTONIX) 40 MG tablet TAKE  1 TABLET DAILY 90 tablet 0  . potassium chloride SA (K-DUR,KLOR-CON) 20 MEQ tablet TAKE 1 AND 1/2 TABLETS DAILY 135 tablet 0  . prochlorperazine (COMPAZINE) 10 MG tablet Take 10 mg by mouth every 6 (six) hours as needed. Reported on 03/22/2015    . timolol (BETIMOL) 0.5 % ophthalmic solution 1 drop 2 (two) times daily.      Marland Kitchen warfarin (COUMADIN) 2 MG tablet TAKE 1 TABLET EVERY DAY 90 tablet 0  . zolendronic acid (ZOMETA) 4 MG/5ML injection Inject 4 mg into the vein every 3 (three) months.     No current  facility-administered medications for this visit.    SURGICAL HISTORY:  Past Surgical History  Procedure Laterality Date  . Appendectomy    . Abdominal hysterectomy  1994    w/BSO    REVIEW OF SYSTEMS:  A comprehensive review of systems was negative except for: Gastrointestinal: positive for diarrhea   PHYSICAL EXAMINATION: General appearance: alert, cooperative and no distress Head: Normocephalic, without obvious abnormality, atraumatic Neck: no adenopathy, no JVD, supple, symmetrical, trachea midline and thyroid not enlarged, symmetric, no tenderness/mass/nodules Lymph nodes: Cervical, supraclavicular, and axillary nodes normal. Resp: clear to auscultation bilaterally Back: symmetric, no curvature. ROM normal. No CVA tenderness. Cardio: regular rate and rhythm, S1, S2 normal, no murmur, click, rub or gallop GI: soft, non-tender; bowel sounds normal; no masses,  no organomegaly Extremities: extremities normal, atraumatic, no cyanosis or edema Neurologic: Alert and oriented X 3, normal strength and tone. Normal symmetric reflexes. Normal coordination and gait  ECOG PERFORMANCE STATUS: 1 - Symptomatic but completely ambulatory  Blood pressure 135/73, pulse 68, temperature 98 F (36.7 C), temperature source Oral, resp. rate 18, height 5' 6"  (1.676 m), weight 169 lb 4.8 oz (76.794 kg), SpO2 99 %.  LABORATORY DATA: Lab Results  Component Value Date   WBC 4.9 06/07/2015   HGB 12.2 06/07/2015   HCT 38.5 06/07/2015   MCV 100.3 06/07/2015   PLT 203 06/07/2015      Chemistry      Component Value Date/Time   NA 142 06/07/2015 0959   NA 142 12/08/2012 0640   K 3.9 06/07/2015 0959   K 3.0* 12/08/2012 0640   CL 109 12/08/2012 0640   CL 106 07/28/2012 0924   CO2 27 06/07/2015 0959   CO2 23 12/08/2012 0640   BUN 14.7 06/07/2015 0959   BUN 11 12/08/2012 0640   CREATININE 1.0 06/07/2015 0959   CREATININE 0.83 12/08/2012 0640   CREATININE 0.93 10/01/2010 0836      Component  Value Date/Time   CALCIUM 9.6 06/07/2015 0959   CALCIUM 7.8* 12/08/2012 0640   ALKPHOS 76 06/07/2015 0959   ALKPHOS 68 12/08/2012 0640   AST 23 06/07/2015 0959   AST 47* 12/08/2012 0640   ALT 30 06/07/2015 0959   ALT 52* 12/08/2012 0640   BILITOT 0.53 06/07/2015 0959   BILITOT 0.4 12/08/2012 0640     Myeloma panel: Beta-2 microglobulin 1.6, free kappa light chain 63.69, free lambda light chain 35.10, kappa/lambda ratio 1.81,   RADIOGRAPHIC STUDIES: No results found.  ASSESSMENT AND PLAN: This is a very pleasant 75 years old African-American female with history of multiple myeloma diagnosed in August 2010 status post several regimen including autologous peripheral blood stem cell transplant and has been on maintenance Revlimid 10 mg by mouth daily since June 2012 and tolerating her treatment fairly well.  The recent myeloma panel showed no evidence for disease progression. I discussed the lab result with  the patient today. I recommended for the patient to continue on her maintenance treatment with daily Revlimid 10 mg as a scheduled.  For the metastatic bone disease, she will continue on Zometa 4 mg IV every 3 months.  For the prophylactic anticoagulation, the patient is currently on 2 mg of Coumadin.  The patient would come back for follow-up visit in 3 months for reevaluation after repeating myeloma panel. She was advised to call immediately if she has any concerning symptoms in the interval. The patient voices understanding of current disease status and treatment options and is in agreement with the current care plan.  All questions were answered. The patient knows to call the clinic with any problems, questions or concerns. We can certainly see the patient much sooner if necessary.  Disclaimer: This note was dictated with voice recognition software. Similar sounding words can inadvertently be transcribed and may not be corrected upon review.

## 2015-06-14 NOTE — Telephone Encounter (Signed)
sent Mw email to zometa and pt to get updated copy b4 leaving trmt

## 2015-06-14 NOTE — Patient Instructions (Signed)

## 2015-06-19 ENCOUNTER — Other Ambulatory Visit: Payer: Self-pay | Admitting: Medical Oncology

## 2015-06-19 DIAGNOSIS — C9000 Multiple myeloma not having achieved remission: Secondary | ICD-10-CM

## 2015-06-19 MED ORDER — LENALIDOMIDE 10 MG PO CAPS: ORAL_CAPSULE | ORAL | Status: DC

## 2015-06-20 DIAGNOSIS — E0590 Thyrotoxicosis, unspecified without thyrotoxic crisis or storm: Secondary | ICD-10-CM | POA: Diagnosis not present

## 2015-06-23 ENCOUNTER — Other Ambulatory Visit: Payer: Self-pay | Admitting: Internal Medicine

## 2015-06-28 ENCOUNTER — Encounter: Payer: Self-pay | Admitting: Internal Medicine

## 2015-06-28 NOTE — Progress Notes (Signed)
Rcvd physician form from [employer] to be signed & faxed back which I did today.  I also called [employer] to request a copy of approval letter & POE since [place] enrolled the pt.  The rep informed me that her account has been closed since they didn't receive the physician's form in time which was originally sent on 04/12/15.  I informed the rep I just received the form on 06/26/15 which was sent by a rep named Anastasia Pall.  She stated they will review the account once the form has been uploaded on her account and it's a good possibility they will reopen her grant if not the pt has to wait until 04/01/16 to reapply.

## 2015-07-11 ENCOUNTER — Other Ambulatory Visit: Payer: Self-pay | Admitting: Internal Medicine

## 2015-07-13 ENCOUNTER — Other Ambulatory Visit: Payer: Self-pay | Admitting: Medical Oncology

## 2015-07-13 DIAGNOSIS — C9000 Multiple myeloma not having achieved remission: Secondary | ICD-10-CM

## 2015-07-13 MED ORDER — LENALIDOMIDE 10 MG PO CAPS: ORAL_CAPSULE | ORAL | Status: DC

## 2015-07-18 ENCOUNTER — Encounter: Payer: Self-pay | Admitting: Internal Medicine

## 2015-07-18 NOTE — Progress Notes (Signed)
Per [facility] revlimid will be shipped 07/24/15

## 2015-07-25 ENCOUNTER — Other Ambulatory Visit: Payer: Self-pay | Admitting: Internal Medicine

## 2015-08-08 ENCOUNTER — Other Ambulatory Visit: Payer: Self-pay | Admitting: Internal Medicine

## 2015-08-15 ENCOUNTER — Other Ambulatory Visit: Payer: Self-pay | Admitting: *Deleted

## 2015-08-15 DIAGNOSIS — C9000 Multiple myeloma not having achieved remission: Secondary | ICD-10-CM

## 2015-08-15 MED ORDER — LENALIDOMIDE 10 MG PO CAPS: ORAL_CAPSULE | ORAL | Status: DC

## 2015-08-15 NOTE — Telephone Encounter (Signed)
Pt Rx for Revlimid 10mg  e-scribed and faxed to [facility] complete 08/15/15

## 2015-08-23 DIAGNOSIS — C9000 Multiple myeloma not having achieved remission: Secondary | ICD-10-CM | POA: Diagnosis not present

## 2015-08-30 ENCOUNTER — Other Ambulatory Visit: Payer: Self-pay | Admitting: Internal Medicine

## 2015-08-31 DIAGNOSIS — H40011 Open angle with borderline findings, low risk, right eye: Secondary | ICD-10-CM | POA: Diagnosis not present

## 2015-09-08 ENCOUNTER — Other Ambulatory Visit (HOSPITAL_BASED_OUTPATIENT_CLINIC_OR_DEPARTMENT_OTHER): Payer: Commercial Managed Care - HMO

## 2015-09-08 ENCOUNTER — Other Ambulatory Visit: Payer: Self-pay | Admitting: Internal Medicine

## 2015-09-08 DIAGNOSIS — C9000 Multiple myeloma not having achieved remission: Secondary | ICD-10-CM | POA: Diagnosis not present

## 2015-09-08 LAB — CBC WITH DIFFERENTIAL/PLATELET
BASO%: 0.6 % (ref 0.0–2.0)
Basophils Absolute: 0 10*3/uL (ref 0.0–0.1)
EOS%: 2.3 % (ref 0.0–7.0)
Eosinophils Absolute: 0.1 10*3/uL (ref 0.0–0.5)
HCT: 36.8 % (ref 34.8–46.6)
HGB: 11.6 g/dL (ref 11.6–15.9)
LYMPH%: 41.5 % (ref 14.0–49.7)
MCH: 31.6 pg (ref 25.1–34.0)
MCHC: 31.5 g/dL (ref 31.5–36.0)
MCV: 100.3 fL (ref 79.5–101.0)
MONO#: 0.6 10*3/uL (ref 0.1–0.9)
MONO%: 12 % (ref 0.0–14.0)
NEUT#: 2.3 10*3/uL (ref 1.5–6.5)
NEUT%: 43.6 % (ref 38.4–76.8)
Platelets: 186 10*3/uL (ref 145–400)
RBC: 3.67 10*6/uL — ABNORMAL LOW (ref 3.70–5.45)
RDW: 14.7 % — ABNORMAL HIGH (ref 11.2–14.5)
WBC: 5.3 10*3/uL (ref 3.9–10.3)
lymph#: 2.2 10*3/uL (ref 0.9–3.3)

## 2015-09-08 LAB — COMPREHENSIVE METABOLIC PANEL
ALT: 33 U/L (ref 0–55)
AST: 23 U/L (ref 5–34)
Albumin: 3.7 g/dL (ref 3.5–5.0)
Alkaline Phosphatase: 76 U/L (ref 40–150)
Anion Gap: 6 mEq/L (ref 3–11)
BUN: 9.4 mg/dL (ref 7.0–26.0)
CO2: 27 mEq/L (ref 22–29)
Calcium: 9.2 mg/dL (ref 8.4–10.4)
Chloride: 108 mEq/L (ref 98–109)
Creatinine: 1 mg/dL (ref 0.6–1.1)
EGFR: 66 mL/min/{1.73_m2} — ABNORMAL LOW (ref >90)
Glucose: 93 mg/dl (ref 70–140)
Potassium: 3.4 mEq/L — ABNORMAL LOW (ref 3.5–5.1)
Sodium: 141 mEq/L (ref 136–145)
Total Bilirubin: 0.43 mg/dL (ref 0.20–1.20)
Total Protein: 7.7 g/dL (ref 6.4–8.3)

## 2015-09-08 LAB — LACTATE DEHYDROGENASE: LDH: 165 U/L (ref 125–245)

## 2015-09-09 LAB — BETA 2 MICROGLOBULIN, SERUM: Beta-2: 1.4 mg/L (ref 0.6–2.4)

## 2015-09-09 LAB — IGG, IGA, IGM
IgA, Qn, Serum: 375 mg/dL (ref 64–422)
IgG, Qn, Serum: 1384 mg/dL (ref 700–1600)
IgM, Qn, Serum: 18 mg/dL — ABNORMAL LOW (ref 26–217)

## 2015-09-11 LAB — KAPPA/LAMBDA LIGHT CHAINS
Ig Kappa Free Light Chain: 61 mg/L — ABNORMAL HIGH (ref 3.3–19.4)
Ig Lambda Free Light Chain: 69.8 mg/L — ABNORMAL HIGH (ref 5.7–26.3)
Kappa/Lambda FluidC Ratio: 0.87 (ref 0.26–1.65)

## 2015-09-12 ENCOUNTER — Other Ambulatory Visit: Payer: Self-pay | Admitting: Medical Oncology

## 2015-09-12 DIAGNOSIS — C9000 Multiple myeloma not having achieved remission: Secondary | ICD-10-CM

## 2015-09-12 MED ORDER — LENALIDOMIDE 10 MG PO CAPS: ORAL_CAPSULE | ORAL | Status: DC

## 2015-09-14 ENCOUNTER — Ambulatory Visit (HOSPITAL_BASED_OUTPATIENT_CLINIC_OR_DEPARTMENT_OTHER): Payer: Commercial Managed Care - HMO

## 2015-09-14 ENCOUNTER — Encounter: Payer: Self-pay | Admitting: Internal Medicine

## 2015-09-14 ENCOUNTER — Telehealth: Payer: Self-pay | Admitting: Internal Medicine

## 2015-09-14 ENCOUNTER — Ambulatory Visit (HOSPITAL_BASED_OUTPATIENT_CLINIC_OR_DEPARTMENT_OTHER): Payer: Commercial Managed Care - HMO | Admitting: Internal Medicine

## 2015-09-14 VITALS — BP 123/67 | HR 74 | Temp 98.0°F | Resp 18 | Ht 66.0 in | Wt 167.7 lb

## 2015-09-14 DIAGNOSIS — C9000 Multiple myeloma not having achieved remission: Secondary | ICD-10-CM

## 2015-09-14 DIAGNOSIS — C7951 Secondary malignant neoplasm of bone: Secondary | ICD-10-CM | POA: Diagnosis not present

## 2015-09-14 DIAGNOSIS — M542 Cervicalgia: Secondary | ICD-10-CM

## 2015-09-14 MED ORDER — ZOLEDRONIC ACID 4 MG/100ML IV SOLN
4.0000 mg | Freq: Once | INTRAVENOUS | Status: AC
  Administered 2015-09-14: 4 mg via INTRAVENOUS
  Filled 2015-09-14: qty 100

## 2015-09-14 MED ORDER — SODIUM CHLORIDE 0.9 % IV SOLN
Freq: Once | INTRAVENOUS | Status: AC
  Administered 2015-09-14: 11:00:00 via INTRAVENOUS

## 2015-09-14 NOTE — Telephone Encounter (Signed)
per pof to sch pt appt-gave pt copy of avs °

## 2015-09-14 NOTE — Progress Notes (Signed)
[place] [phone]   [phone]  OFFICE PROGRESS NOTE  [name], MD [address] [place] [address]  DIAGNOSIS: Multiple myeloma, kappa light chain disease diagnosed in August 2010.  PRIOR THERAPY: 1) status post systemic chemotherapy with Velcade, Doxil and Decadron between 04/24/2009 through 07/27/2009 discontinued secondary to peripheral neuropathy. 2) status post treatment with Revlimid and Decadron between 09/01/2009 through December 2011. 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 Miki Kins.   CURRENT THERAPY:  1) Maintenance chemotherapy with Revlimid 10 mg by mouth daily started June 2012. 2) Zometa 4 mg IV every 3 months. 3) prophylactic anticoagulation with Coumadin 2 mg by mouth daily.  INTERVAL HISTORY: Carmen Fisher 75 y.o. female returns to the clinic today for follow-up visit. The patient is feeling fine today except for the pain started 2 weeks ago and she is working in her yard. She takes only Tylenol with improvement of her pain. She denied having any chest pain, shortness of breath, cough or hemoptysis. She has no fever or chills, no nausea or vomiting. She has no significant weight loss or night sweats. She is tolerating her current maintenance treatment with Revlimid fairly well except for 3-4 episodes of diarrhea on daily basis depending on the meals. Imodium does help her diarrhea. She had repeat myeloma panel performed earlier today and she is here for evaluation and discussion of her lab results.  MEDICAL HISTORY: Past Medical History  Diagnosis Date  . Multiple myeloma, without mention of having achieved remission   . Anemia, unspecified   . Hypertension   . Peripheral neuropathy ([place])   . Dyslipidemia   . Glaucoma   . Multiple myeloma ([place])   . Chronic anticoagulation   . Glaucoma   . Gout   .  Osteoporosis   . Hypokalemia   . GERD (gastroesophageal reflux disease)     ALLERGIES:  is allergic to aspirin; aspirin; penicillins; sulfa antibiotics; and sulfa antibiotics.  MEDICATIONS:  Current Outpatient Prescriptions  Medication Sig Dispense Refill  . acetaminophen (TYLENOL) 500 MG tablet Take 500 mg by mouth every 6 (six) hours as needed.      Marland Kitchen allopurinol (ZYLOPRIM) 300 MG tablet TAKE 1 TABLET EVERY DAY 90 tablet 0  . Alum & Mag Hydroxide-Simeth (MAGIC MOUTHWASH) SOLN Take 5 mLs by mouth as needed.      . cholecalciferol (VITAMIN D) 1000 UNITS tablet Take 1,000 Units by mouth daily.    . Cranberry 250 MG TABS Take 250 mg by mouth 2 (two) times daily.    Marland Kitchen gabapentin (NEURONTIN) 600 MG tablet TAKE 1 TABLET TWICE DAILY 180 tablet 0  . latanoprost (XALATAN) 0.005 % ophthalmic solution 1 drop at bedtime.      Marland Kitchen lenalidomide (REVLIMID) 10 MG capsule TAKE ONE 10 mg CAPSULE DAILY BY MOUTH FOR 28 days 28 capsule 0  . loperamide (IMODIUM) 2 MG capsule TAKE ONE CAPSULE BY MOUTH AS NEEDED FOR  DIARRHEA  OR  LOOSE  STOOLS 30 capsule 0  . LORazepam (ATIVAN) 1 MG tablet Take 1 mg by mouth every 4 (four) hours as needed. Reported on 03/22/2015    . methocarbamol (ROBAXIN) 500 MG tablet Take 500 mg by mouth 4 (four) times daily as needed.    . Multiple Vitamin (MULTIVITAMIN WITH MINERALS) TABS tablet Take 1 tablet by mouth daily.    Marland Kitchen oxyCODONE (OXY IR/ROXICODONE) 5 MG  immediate release tablet Take 1 tablet (5 mg total) by mouth every 6 (six) hours as needed. 30 tablet 0  . pantoprazole (PROTONIX) 40 MG tablet TAKE 1 TABLET EVERY DAY 90 tablet 0  . potassium chloride SA (K-DUR,KLOR-CON) 20 MEQ tablet TAKE 1 AND 1/2 TABLETS DAILY 135 tablet 0  . prochlorperazine (COMPAZINE) 10 MG tablet Take 10 mg by mouth every 6 (six) hours as needed. Reported on 03/22/2015    . timolol (BETIMOL) 0.5 % ophthalmic solution 1 drop 2 (two) times daily.      Marland Kitchen warfarin (COUMADIN) 2 MG tablet TAKE 1 TABLET EVERY DAY  90 tablet 0  . zolendronic acid (ZOMETA) 4 MG/5ML injection Inject 4 mg into the vein every 3 (three) months.     No current facility-administered medications for this visit.    SURGICAL HISTORY:  Past Surgical History  Procedure Laterality Date  . Appendectomy    . Abdominal hysterectomy  1994    w/BSO    REVIEW OF SYSTEMS:  A comprehensive review of systems was negative except for: Gastrointestinal: positive for diarrhea Musculoskeletal: positive for neck pain   PHYSICAL EXAMINATION: General appearance: alert, cooperative and no distress Head: Normocephalic, without obvious abnormality, atraumatic Neck: no adenopathy, no JVD, supple, symmetrical, trachea midline and thyroid not enlarged, symmetric, no tenderness/mass/nodules Lymph nodes: Cervical, supraclavicular, and axillary nodes normal. Resp: clear to auscultation bilaterally Back: symmetric, no curvature. ROM normal. No CVA tenderness. Cardio: regular rate and rhythm, S1, S2 normal, no murmur, click, rub or gallop GI: soft, non-tender; bowel sounds normal; no masses,  no organomegaly Extremities: extremities normal, atraumatic, no cyanosis or edema Neurologic: Alert and oriented X 3, normal strength and tone. Normal symmetric reflexes. Normal coordination and gait  ECOG PERFORMANCE STATUS: 1 - Symptomatic but completely ambulatory  Blood pressure 123/67, pulse 74, temperature 98 F (36.7 C), temperature source Oral, resp. rate 18, height 5' 6" (1.676 m), weight 167 lb 11.2 oz (76.068 kg), SpO2 100 %.  LABORATORY DATA: Lab Results  Component Value Date   WBC 5.3 09/08/2015   HGB 11.6 09/08/2015   HCT 36.8 09/08/2015   MCV 100.3 09/08/2015   PLT 186 09/08/2015      Chemistry      Component Value Date/Time   NA 141 09/08/2015 1008   NA 142 12/08/2012 0640   K 3.4* 09/08/2015 1008   K 3.0* 12/08/2012 0640   CL 109 12/08/2012 0640   CL 106 07/28/2012 0924   CO2 27 09/08/2015 1008   CO2 23 12/08/2012 0640   BUN  9.4 09/08/2015 1008   BUN 11 12/08/2012 0640   CREATININE 1.0 09/08/2015 1008   CREATININE 0.83 12/08/2012 0640   CREATININE 0.93 10/01/2010 0836      Component Value Date/Time   CALCIUM 9.2 09/08/2015 1008   CALCIUM 7.8* 12/08/2012 0640   ALKPHOS 76 09/08/2015 1008   ALKPHOS 68 12/08/2012 0640   AST 23 09/08/2015 1008   AST 47* 12/08/2012 0640   ALT 33 09/08/2015 1008   ALT 52* 12/08/2012 0640   BILITOT 0.43 09/08/2015 1008   BILITOT 0.4 12/08/2012 0640     Myeloma panel: Beta-2 microglobulin 1.4, free kappa light chain 61.00, free lambda light chain 69.80, kappa/lambda ratio 1.81, IgG 1384, IgA 375 and IgM 18  RADIOGRAPHIC STUDIES: No results found.  ASSESSMENT AND PLAN: This is a very pleasant 75 years old African-American female with history of multiple myeloma diagnosed in August 2010 status post several regimen including autologous peripheral blood  stem cell transplant and has been on maintenance Revlimid 10 mg by mouth daily since June 2012 and tolerating her treatment fairly well.  The recent myeloma panel showed no evidence for disease progression except for increase of the free lambda light chain. I discussed the lab result with the patient today. I recommended for the patient to continue on her maintenance treatment with daily Revlimid 10 mg as a scheduled.  For the metastatic bone disease, she will continue on Zometa 4 mg IV every 3 months.  For the prophylactic anticoagulation, the patient is currently on 2 mg of Coumadin.  The patient would come back for follow-up visit in 3 months for reevaluation after repeating myeloma panel. For the neck pain, I advised the patient to consult with an orthopedic surgeon to rule out degenerative disc disease if her pain persisted. She was advised to call immediately if she has any concerning symptoms in the interval. The patient voices understanding of current disease status and treatment options and is in agreement with the current  care plan.  All questions were answered. The patient knows to call the clinic with any problems, questions or concerns. We can certainly see the patient much sooner if necessary.  Disclaimer: This note was dictated with voice recognition software. Similar sounding words can inadvertently be transcribed and may not be corrected upon review.

## 2015-09-14 NOTE — Patient Instructions (Signed)

## 2015-09-21 DIAGNOSIS — K219 Gastro-esophageal reflux disease without esophagitis: Secondary | ICD-10-CM | POA: Diagnosis not present

## 2015-09-21 DIAGNOSIS — M25512 Pain in left shoulder: Secondary | ICD-10-CM | POA: Diagnosis not present

## 2015-10-05 ENCOUNTER — Other Ambulatory Visit: Payer: Self-pay | Admitting: *Deleted

## 2015-10-05 DIAGNOSIS — M50322 Other cervical disc degeneration at C5-C6 level: Secondary | ICD-10-CM | POA: Diagnosis not present

## 2015-10-05 DIAGNOSIS — C9000 Multiple myeloma not having achieved remission: Secondary | ICD-10-CM

## 2015-10-05 DIAGNOSIS — M542 Cervicalgia: Secondary | ICD-10-CM | POA: Diagnosis not present

## 2015-10-05 MED ORDER — LENALIDOMIDE 10 MG PO CAPS: ORAL_CAPSULE | ORAL | Status: DC

## 2015-10-05 NOTE — Progress Notes (Signed)
Per last office note Revlimid Rx refilled.  thorization # [id number]  Faxed to [facility]

## 2015-10-06 ENCOUNTER — Other Ambulatory Visit: Payer: Self-pay | Admitting: Internal Medicine

## 2015-10-19 ENCOUNTER — Other Ambulatory Visit: Payer: Self-pay | Admitting: Internal Medicine

## 2015-10-30 DIAGNOSIS — M50322 Other cervical disc degeneration at C5-C6 level: Secondary | ICD-10-CM | POA: Diagnosis not present

## 2015-10-31 ENCOUNTER — Other Ambulatory Visit: Payer: Self-pay | Admitting: Internal Medicine

## 2015-11-08 ENCOUNTER — Other Ambulatory Visit: Payer: Self-pay | Admitting: Internal Medicine

## 2015-11-08 ENCOUNTER — Other Ambulatory Visit: Payer: Self-pay | Admitting: Medical Oncology

## 2015-11-08 DIAGNOSIS — C9000 Multiple myeloma not having achieved remission: Secondary | ICD-10-CM

## 2015-11-08 MED ORDER — LENALIDOMIDE 10 MG PO CAPS: ORAL_CAPSULE | ORAL | 0 refills | Status: DC

## 2015-11-23 ENCOUNTER — Telehealth: Payer: Self-pay | Admitting: *Deleted

## 2015-11-23 NOTE — Telephone Encounter (Signed)
Message from pt reporting sore throat when she coughs and an ulcer at the back of her tongue. No answer at home #. Left message on mobile for pt to call office.  Any fever? New medications? What has she tried so far?

## 2015-11-27 ENCOUNTER — Other Ambulatory Visit: Payer: Self-pay | Admitting: Internal Medicine

## 2015-11-28 ENCOUNTER — Other Ambulatory Visit: Payer: Self-pay | Admitting: *Deleted

## 2015-11-28 MED ORDER — FIRST-DUKES MOUTHWASH MT SUSP: OROMUCOSAL | 0 refills | Status: DC

## 2015-12-01 ENCOUNTER — Other Ambulatory Visit: Payer: Self-pay | Admitting: Medical Oncology

## 2015-12-01 DIAGNOSIS — C9000 Multiple myeloma not having achieved remission: Secondary | ICD-10-CM

## 2015-12-01 MED ORDER — LENALIDOMIDE 10 MG PO CAPS: ORAL_CAPSULE | ORAL | 0 refills | Status: DC

## 2015-12-05 DIAGNOSIS — J029 Acute pharyngitis, unspecified: Secondary | ICD-10-CM | POA: Diagnosis not present

## 2015-12-05 DIAGNOSIS — H6121 Impacted cerumen, right ear: Secondary | ICD-10-CM | POA: Diagnosis not present

## 2015-12-08 ENCOUNTER — Other Ambulatory Visit (HOSPITAL_BASED_OUTPATIENT_CLINIC_OR_DEPARTMENT_OTHER): Payer: Commercial Managed Care - HMO

## 2015-12-08 DIAGNOSIS — C9000 Multiple myeloma not having achieved remission: Secondary | ICD-10-CM

## 2015-12-08 LAB — CBC WITH DIFFERENTIAL/PLATELET
BASO%: 1.1 % (ref 0.0–2.0)
Basophils Absolute: 0 10*3/uL (ref 0.0–0.1)
EOS%: 2.7 % (ref 0.0–7.0)
Eosinophils Absolute: 0.1 10*3/uL (ref 0.0–0.5)
HCT: 36.8 % (ref 34.8–46.6)
HGB: 11.7 g/dL (ref 11.6–15.9)
LYMPH%: 44.7 % (ref 14.0–49.7)
MCH: 31.9 pg (ref 25.1–34.0)
MCHC: 31.7 g/dL (ref 31.5–36.0)
MCV: 100.5 fL (ref 79.5–101.0)
MONO#: 0.3 10*3/uL (ref 0.1–0.9)
MONO%: 10.1 % (ref 0.0–14.0)
NEUT#: 1.4 10*3/uL — ABNORMAL LOW (ref 1.5–6.5)
NEUT%: 41.4 % (ref 38.4–76.8)
Platelets: 201 10*3/uL (ref 145–400)
RBC: 3.66 10*6/uL — ABNORMAL LOW (ref 3.70–5.45)
RDW: 15.5 % — ABNORMAL HIGH (ref 11.2–14.5)
WBC: 3.3 10*3/uL — ABNORMAL LOW (ref 3.9–10.3)
lymph#: 1.5 10*3/uL (ref 0.9–3.3)

## 2015-12-08 LAB — COMPREHENSIVE METABOLIC PANEL
ALT: 23 U/L (ref 0–55)
AST: 20 U/L (ref 5–34)
Albumin: 3.7 g/dL (ref 3.5–5.0)
Alkaline Phosphatase: 78 U/L (ref 40–150)
Anion Gap: 8 mEq/L (ref 3–11)
BUN: 13.2 mg/dL (ref 7.0–26.0)
CO2: 27 mEq/L (ref 22–29)
Calcium: 9.3 mg/dL (ref 8.4–10.4)
Chloride: 107 mEq/L (ref 98–109)
Creatinine: 0.9 mg/dL (ref 0.6–1.1)
EGFR: 74 mL/min/{1.73_m2} — ABNORMAL LOW (ref >90)
Glucose: 97 mg/dl (ref 70–140)
Potassium: 3.9 mEq/L (ref 3.5–5.1)
Sodium: 141 mEq/L (ref 136–145)
Total Bilirubin: 0.55 mg/dL (ref 0.20–1.20)
Total Protein: 7.8 g/dL (ref 6.4–8.3)

## 2015-12-08 LAB — LACTATE DEHYDROGENASE: LDH: 170 U/L (ref 125–245)

## 2015-12-09 LAB — BETA 2 MICROGLOBULIN, SERUM: Beta-2: 1.5 mg/L (ref 0.6–2.4)

## 2015-12-09 LAB — IGG, IGA, IGM
IgA, Qn, Serum: 392 mg/dL (ref 64–422)
IgG, Qn, Serum: 1370 mg/dL (ref 700–1600)
IgM, Qn, Serum: 24 mg/dL — ABNORMAL LOW (ref 26–217)

## 2015-12-11 LAB — KAPPA/LAMBDA LIGHT CHAINS
Ig Kappa Free Light Chain: 58.9 mg/L — ABNORMAL HIGH (ref 3.3–19.4)
Ig Lambda Free Light Chain: 30.8 mg/L — ABNORMAL HIGH (ref 5.7–26.3)
Kappa/Lambda FluidC Ratio: 1.91 — ABNORMAL HIGH (ref 0.26–1.65)

## 2015-12-14 ENCOUNTER — Ambulatory Visit (HOSPITAL_BASED_OUTPATIENT_CLINIC_OR_DEPARTMENT_OTHER): Payer: Commercial Managed Care - HMO | Admitting: Internal Medicine

## 2015-12-14 ENCOUNTER — Telehealth: Payer: Self-pay | Admitting: Internal Medicine

## 2015-12-14 ENCOUNTER — Ambulatory Visit (HOSPITAL_BASED_OUTPATIENT_CLINIC_OR_DEPARTMENT_OTHER): Payer: Commercial Managed Care - HMO

## 2015-12-14 ENCOUNTER — Encounter: Payer: Self-pay | Admitting: Internal Medicine

## 2015-12-14 VITALS — BP 138/64 | HR 64 | Temp 98.4°F | Resp 17 | Ht 66.0 in | Wt 165.6 lb

## 2015-12-14 DIAGNOSIS — Z9484 Stem cells transplant status: Secondary | ICD-10-CM | POA: Diagnosis not present

## 2015-12-14 DIAGNOSIS — C9000 Multiple myeloma not having achieved remission: Secondary | ICD-10-CM

## 2015-12-14 DIAGNOSIS — C7951 Secondary malignant neoplasm of bone: Secondary | ICD-10-CM

## 2015-12-14 MED ORDER — SODIUM CHLORIDE 0.9 % IV SOLN
Freq: Once | INTRAVENOUS | Status: AC
  Administered 2015-12-14: 12:00:00 via INTRAVENOUS

## 2015-12-14 MED ORDER — ZOLEDRONIC ACID 4 MG/100ML IV SOLN
4.0000 mg | Freq: Once | INTRAVENOUS | Status: AC
  Administered 2015-12-14: 4 mg via INTRAVENOUS
  Filled 2015-12-14: qty 100

## 2015-12-14 NOTE — Patient Instructions (Signed)
Zoledronic Acid injection (Hypercalcemia, Oncology) (Zometa) What is this medicine? ZOLEDRONIC ACID (ZOE le dron ik AS id) lowers the amount of calcium loss from bone. It is used to treat too much calcium in your blood from cancer. It is also used to prevent complications of cancer that has spread to the bone. This medicine may be used for other purposes; ask your health care provider or pharmacist if you have questions. What should I tell my health care provider before I take this medicine? They need to know if you have any of these conditions: -aspirin-sensitive asthma -cancer, especially if you are receiving medicines used to treat cancer -dental disease or wear dentures -infection -kidney disease -receiving corticosteroids like dexamethasone or prednisone -an unusual or allergic reaction to zoledronic acid, other medicines, foods, dyes, or preservatives -pregnant or trying to get pregnant -breast-feeding How should I use this medicine? This medicine is for infusion into a vein. It is given by a health care professional in a hospital or clinic setting. Talk to your pediatrician regarding the use of this medicine in children. Special care may be needed. Overdosage: If you think you have taken too much of this medicine contact a poison control center or emergency room at once. NOTE: This medicine is only for you. Do not share this medicine with others. What if I miss a dose? It is important not to miss your dose. Call your doctor or health care professional if you are unable to keep an appointment. What may interact with this medicine? -certain antibiotics given by injection -NSAIDs, medicines for pain and inflammation, like ibuprofen or naproxen -some diuretics like bumetanide, furosemide -teriparatide -thalidomide This list may not describe all possible interactions. Give your health care provider a list of all the medicines, herbs, non-prescription drugs, or dietary supplements you  use. Also tell them if you smoke, drink alcohol, or use illegal drugs. Some items may interact with your medicine. What should I watch for while using this medicine? Visit your doctor or health care professional for regular checkups. It may be some time before you see the benefit from this medicine. Do not stop taking your medicine unless your doctor tells you to. Your doctor may order blood tests or other tests to see how you are doing. Women should inform their doctor if they wish to become pregnant or think they might be pregnant. There is a potential for serious side effects to an unborn child. Talk to your health care professional or pharmacist for more information. You should make sure that you get enough calcium and vitamin D while you are taking this medicine. Discuss the foods you eat and the vitamins you take with your health care professional. Some people who take this medicine have severe bone, joint, and/or muscle pain. This medicine may also increase your risk for jaw problems or a broken thigh bone. Tell your doctor right away if you have severe pain in your jaw, bones, joints, or muscles. Tell your doctor if you have any pain that does not go away or that gets worse. Tell your dentist and dental surgeon that you are taking this medicine. You should not have major dental surgery while on this medicine. See your dentist to have a dental exam and fix any dental problems before starting this medicine. Take good care of your teeth while on this medicine. Make sure you see your dentist for regular follow-up appointments. What side effects may I notice from receiving this medicine? Side effects that you should report   to your doctor or health care professional as soon as possible: -allergic reactions like skin rash, itching or hives, swelling of the face, lips, or tongue -anxiety, confusion, or depression -breathing problems -changes in vision -eye pain -feeling faint or lightheaded,  falls -jaw pain, especially after dental work -mouth sores -muscle cramps, stiffness, or weakness -redness, blistering, peeling or loosening of the skin, including inside the mouth -trouble passing urine or change in the amount of urine Side effects that usually do not require medical attention (report to your doctor or health care professional if they continue or are bothersome): -bone, joint, or muscle pain -constipation -diarrhea -fever -hair loss -irritation at site where injected -loss of appetite -nausea, vomiting -stomach upset -trouble sleeping -trouble swallowing -weak or tired This list may not describe all possible side effects. Call your doctor for medical advice about side effects. You may report side effects to FDA at 1-800-FDA-1088. Where should I keep my medicine? This drug is given in a hospital or clinic and will not be stored at home. NOTE: This sheet is a summary. It may not cover all possible information. If you have questions about this medicine, talk to your doctor, pharmacist, or health care provider.    2016, Elsevier/Gold Standard. (2013-08-14 14:19:39)  

## 2015-12-14 NOTE — Telephone Encounter (Signed)
Gave patient avs report and appointments for December  °

## 2015-12-14 NOTE — Progress Notes (Signed)
[place] [phone]   Fax:([phone]  OFFICE PROGRESS NOTE  [name], MD [address] [place] [address]  DIAGNOSIS: Multiple myeloma, kappa light chain disease diagnosed in August 2010.  PRIOR THERAPY: 1) status post systemic chemotherapy with Velcade, Doxil and Decadron between 04/24/2009 through 07/27/2009 discontinued secondary to peripheral neuropathy. 2) status post treatment with Revlimid and Decadron between 09/01/2009 through December 2011. 3) status post peripheral blood autologous stem cell transplant on 04/26/2010 at [facility] under the care of Dr. Miki Kins.   CURRENT THERAPY:  1) Maintenance chemotherapy with Revlimid 10 mg by mouth daily started June 2012. 2) Zometa 4 mg IV every 3 months. 3) prophylactic anticoagulation with Coumadin 2 mg by mouth daily.  INTERVAL HISTORY: ADAISHA Fisher 75 y.o. female returns to the clinic today for follow-up visit accompanied by family member. The patient is feeling fine today. She denied having any chest pain, shortness of breath, cough or hemoptysis. She has no fever or chills, no nausea or vomiting. She has no significant weight loss or night sweats. She is tolerating her current maintenance treatment with Revlimid fairly well except for few episodes of diarrhea and she is currently on Imodium and Lomotil on as-needed basis. She had repeat myeloma panel performed earlier today and she is here for evaluation and discussion of her lab results.  MEDICAL HISTORY: Past Medical History:  Diagnosis Date  . Anemia, unspecified   . Chronic anticoagulation   . Dyslipidemia   . GERD (gastroesophageal reflux disease)   . Glaucoma   . Glaucoma   . Gout   . Hypertension   . Hypokalemia   . Multiple myeloma ([place])   . Multiple myeloma, without mention of having achieved remission   . Osteoporosis   . Peripheral neuropathy ([facility])     ALLERGIES:  is allergic  to aspirin; aspirin; penicillins; sulfa antibiotics; and sulfa antibiotics.  MEDICATIONS:  Current Outpatient Prescriptions  Medication Sig Dispense Refill  . acetaminophen (TYLENOL) 500 MG tablet Take 500 mg by mouth every 6 (six) hours as needed.      Marland Kitchen allopurinol (ZYLOPRIM) 300 MG tablet TAKE 1 TABLET EVERY DAY 90 tablet 0  . cholecalciferol (VITAMIN D) 1000 UNITS tablet Take 1,000 Units by mouth daily.    . Cranberry 250 MG TABS Take 250 mg by mouth 2 (two) times daily.    . Diphenhyd-Hydrocort-Nystatin (FIRST-DUKES MOUTHWASH) SUSP Swish and spit one teaspoon by mouth 4 times daily 240 mL 0  . gabapentin (NEURONTIN) 600 MG tablet TAKE 1 TABLET TWICE DAILY 180 tablet 0  . latanoprost (XALATAN) 0.005 % ophthalmic solution 1 drop at bedtime.      Marland Kitchen lenalidomide (REVLIMID) 10 MG capsule TAKE ONE 10 mg CAPSULE DAILY BY MOUTH FOR 28 days 28 capsule 0  . loperamide (IMODIUM) 2 MG capsule TAKE ONE CAPSULE BY MOUTH AS NEEDED FOR DIARRHEA OR LOOSE STOOLS. 30 capsule 0  . LORazepam (ATIVAN) 1 MG tablet Take 1 mg by mouth every 4 (four) hours as needed. Reported on 09/14/2015    . Multiple Vitamin (MULTIVITAMIN WITH MINERALS) TABS tablet Take 1 tablet by mouth daily.    Marland Kitchen oxyCODONE (OXY IR/ROXICODONE) 5 MG immediate release tablet Take 1 tablet (5 mg total) by mouth every 6 (six) hours as needed. (Patient not taking: Reported on 09/14/2015) 30 tablet 0  . pantoprazole (PROTONIX) 40 MG tablet TAKE 1 TABLET EVERY DAY 90 tablet 0  . potassium chloride SA (  K-DUR,KLOR-CON) 20 MEQ tablet TAKE 1 AND 1/2 TABLETS DAILY 135 tablet 0  . prochlorperazine (COMPAZINE) 10 MG tablet Take 10 mg by mouth every 6 (six) hours as needed. Reported on 09/14/2015    . timolol (BETIMOL) 0.5 % ophthalmic solution 1 drop 2 (two) times daily.      Marland Kitchen warfarin (COUMADIN) 2 MG tablet TAKE 1 TABLET EVERY DAY 90 tablet 0  . zolendronic acid (ZOMETA) 4 MG/5ML injection Inject 4 mg into the vein every 3 (three) months.     No current  facility-administered medications for this visit.     SURGICAL HISTORY:  Past Surgical History:  Procedure Laterality Date  . ABDOMINAL HYSTERECTOMY  1994   w/BSO  . APPENDECTOMY      REVIEW OF SYSTEMS:  A comprehensive review of systems was negative except for: Gastrointestinal: positive for diarrhea   PHYSICAL EXAMINATION: General appearance: alert, cooperative and no distress Head: Normocephalic, without obvious abnormality, atraumatic Neck: no adenopathy, no JVD, supple, symmetrical, trachea midline and thyroid not enlarged, symmetric, no tenderness/mass/nodules Lymph nodes: Cervical, supraclavicular, and axillary nodes normal. Resp: clear to auscultation bilaterally Back: symmetric, no curvature. ROM normal. No CVA tenderness. Cardio: regular rate and rhythm, S1, S2 normal, no murmur, click, rub or gallop GI: soft, non-tender; bowel sounds normal; no masses,  no organomegaly Extremities: extremities normal, atraumatic, no cyanosis or edema Neurologic: Alert and oriented X 3, normal strength and tone. Normal symmetric reflexes. Normal coordination and gait  ECOG PERFORMANCE STATUS: 1 - Symptomatic but completely ambulatory  There were no vitals taken for this visit.  LABORATORY DATA: Lab Results  Component Value Date   WBC 3.3 (L) 12/08/2015   HGB 11.7 12/08/2015   HCT 36.8 12/08/2015   MCV 100.5 12/08/2015   PLT 201 12/08/2015      Chemistry      Component Value Date/Time   NA 141 12/08/2015 1034   K 3.9 12/08/2015 1034   CL 109 12/08/2012 0640   CL 106 07/28/2012 0924   CO2 27 12/08/2015 1034   BUN 13.2 12/08/2015 1034   CREATININE 0.9 12/08/2015 1034      Component Value Date/Time   CALCIUM 9.3 12/08/2015 1034   ALKPHOS 78 12/08/2015 1034   AST 20 12/08/2015 1034   ALT 23 12/08/2015 1034   BILITOT 0.55 12/08/2015 1034     Myeloma panel: Beta-2 microglobulin 1.5, free kappa light chain 58.9, free lambda light chain 30.8, kappa/lambda ratio 1.91, IgG  1370, IgA 392 and IgM 24  RADIOGRAPHIC STUDIES: No results found.  ASSESSMENT AND PLAN: This is a very pleasant 75 years old African-American female with history of multiple myeloma diagnosed in August 2010 status post several regimen including autologous peripheral blood stem cell transplant and has been on maintenance Revlimid 10 mg by mouth daily since June 2012 and tolerating her treatment fairly well.  The recent myeloma panel showed no evidence for disease progression. I discussed the lab result with the patient today. I recommended for the patient to continue on her maintenance treatment with daily Revlimid 10 mg as a scheduled.  For the metastatic bone disease, she will continue on Zometa 4 mg IV every 3 months.  For the prophylactic anticoagulation, the patient is currently on 2 mg of Coumadin.  I will see her back for follow-up visit in 3 months with repeat myeloma panel. She was advised to call immediately if she has any concerning symptoms in the interval. The patient voices understanding of current disease status and  treatment options and is in agreement with the current care plan.  All questions were answered. The patient knows to call the clinic with any problems, questions or concerns. We can certainly see the patient much sooner if necessary.  Disclaimer: This note was dictated with voice recognition software. Similar sounding words can inadvertently be transcribed and may not be corrected upon review.

## 2015-12-23 ENCOUNTER — Other Ambulatory Visit: Payer: Self-pay | Admitting: Internal Medicine

## 2016-01-01 ENCOUNTER — Other Ambulatory Visit: Payer: Self-pay | Admitting: Medical Oncology

## 2016-01-01 DIAGNOSIS — R197 Diarrhea, unspecified: Secondary | ICD-10-CM

## 2016-01-01 DIAGNOSIS — Z7901 Long term (current) use of anticoagulants: Secondary | ICD-10-CM

## 2016-01-02 ENCOUNTER — Other Ambulatory Visit: Payer: Self-pay | Admitting: Medical Oncology

## 2016-01-02 DIAGNOSIS — Z7901 Long term (current) use of anticoagulants: Secondary | ICD-10-CM

## 2016-01-02 DIAGNOSIS — R197 Diarrhea, unspecified: Secondary | ICD-10-CM

## 2016-01-02 MED ORDER — LOPERAMIDE HCL 2 MG PO CAPS: 2.0000 mg | ORAL_CAPSULE | ORAL | 1 refills | Status: DC | PRN

## 2016-01-02 MED ORDER — WARFARIN SODIUM 2 MG PO TABS: 2.0000 mg | ORAL_TABLET | Freq: Every day | ORAL | 1 refills | Status: DC

## 2016-01-04 ENCOUNTER — Telehealth: Payer: Self-pay

## 2016-01-04 NOTE — Telephone Encounter (Signed)
Mad[facility] from [facility] called for refill Rx on pts revlimid.

## 2016-01-05 ENCOUNTER — Telehealth: Payer: Self-pay | Admitting: *Deleted

## 2016-01-05 NOTE — Telephone Encounter (Signed)
Pt left VM states she needs refill on Revlimid.

## 2016-01-09 ENCOUNTER — Other Ambulatory Visit: Payer: Self-pay | Admitting: Medical Oncology

## 2016-01-09 ENCOUNTER — Telehealth: Payer: Self-pay | Admitting: Medical Oncology

## 2016-01-09 DIAGNOSIS — C9000 Multiple myeloma not having achieved remission: Secondary | ICD-10-CM

## 2016-01-09 MED ORDER — LENALIDOMIDE 10 MG PO CAPS: ORAL_CAPSULE | ORAL | 0 refills | Status: DC

## 2016-01-09 NOTE — Telephone Encounter (Signed)
Faxed  revlimid refill. 

## 2016-01-10 ENCOUNTER — Other Ambulatory Visit: Payer: Self-pay | Admitting: Internal Medicine

## 2016-01-11 ENCOUNTER — Other Ambulatory Visit: Payer: Self-pay | Admitting: Medical Oncology

## 2016-01-11 DIAGNOSIS — R197 Diarrhea, unspecified: Secondary | ICD-10-CM

## 2016-01-11 MED ORDER — LOPERAMIDE HCL 2 MG PO CAPS: 2.0000 mg | ORAL_CAPSULE | ORAL | 1 refills | Status: DC | PRN

## 2016-02-02 ENCOUNTER — Other Ambulatory Visit: Payer: Self-pay | Admitting: *Deleted

## 2016-02-02 DIAGNOSIS — C9000 Multiple myeloma not having achieved remission: Secondary | ICD-10-CM

## 2016-02-02 MED ORDER — LENALIDOMIDE 10 MG PO CAPS: ORAL_CAPSULE | ORAL | 0 refills | Status: DC

## 2016-02-02 NOTE — Telephone Encounter (Signed)
Called [facility] verified Revlimid Rx was received via [facility]ribe. Request for this pt be marked Urgent as pt will need day 1 of next dose Monday 11/6. [facility] confirmed it will be delivered to pt by Monday. No further concerns.

## 2016-02-13 ENCOUNTER — Other Ambulatory Visit: Payer: Self-pay | Admitting: Internal Medicine

## 2016-02-13 DIAGNOSIS — Z1231 Encounter for screening mammogram for malignant neoplasm of breast: Secondary | ICD-10-CM

## 2016-02-23 ENCOUNTER — Other Ambulatory Visit: Payer: Self-pay | Admitting: Internal Medicine

## 2016-02-23 DIAGNOSIS — Z7901 Long term (current) use of anticoagulants: Secondary | ICD-10-CM

## 2016-02-27 ENCOUNTER — Other Ambulatory Visit: Payer: Self-pay | Admitting: Medical Oncology

## 2016-02-27 DIAGNOSIS — C9000 Multiple myeloma not having achieved remission: Secondary | ICD-10-CM

## 2016-02-27 MED ORDER — LENALIDOMIDE 10 MG PO CAPS: ORAL_CAPSULE | ORAL | 0 refills | Status: DC

## 2016-03-01 DIAGNOSIS — H35351 Cystoid macular degeneration, right eye: Secondary | ICD-10-CM | POA: Diagnosis not present

## 2016-03-01 DIAGNOSIS — H349 Unspecified retinal vascular occlusion: Secondary | ICD-10-CM | POA: Diagnosis not present

## 2016-03-01 DIAGNOSIS — H40011 Open angle with borderline findings, low risk, right eye: Secondary | ICD-10-CM | POA: Diagnosis not present

## 2016-03-07 ENCOUNTER — Other Ambulatory Visit (HOSPITAL_BASED_OUTPATIENT_CLINIC_OR_DEPARTMENT_OTHER): Payer: Commercial Managed Care - HMO

## 2016-03-07 DIAGNOSIS — C9000 Multiple myeloma not having achieved remission: Secondary | ICD-10-CM | POA: Diagnosis not present

## 2016-03-07 LAB — COMPREHENSIVE METABOLIC PANEL
ALT: 35 U/L (ref 0–55)
AST: 25 U/L (ref 5–34)
Albumin: 3.3 g/dL — ABNORMAL LOW (ref 3.5–5.0)
Alkaline Phosphatase: 111 U/L (ref 40–150)
Anion Gap: 8 mEq/L (ref 3–11)
BUN: 10.7 mg/dL (ref 7.0–26.0)
CO2: 27 mEq/L (ref 22–29)
Calcium: 9.2 mg/dL (ref 8.4–10.4)
Chloride: 109 mEq/L (ref 98–109)
Creatinine: 0.8 mg/dL (ref 0.6–1.1)
EGFR: 81 mL/min/{1.73_m2} — ABNORMAL LOW (ref >90)
Glucose: 84 mg/dl (ref 70–140)
Potassium: 3.5 mEq/L (ref 3.5–5.1)
Sodium: 144 mEq/L (ref 136–145)
Total Bilirubin: 0.34 mg/dL (ref 0.20–1.20)
Total Protein: 7.4 g/dL (ref 6.4–8.3)

## 2016-03-07 LAB — CBC WITH DIFFERENTIAL/PLATELET
BASO%: 1 % (ref 0.0–2.0)
Basophils Absolute: 0 10*3/uL (ref 0.0–0.1)
EOS%: 2 % (ref 0.0–7.0)
Eosinophils Absolute: 0.1 10*3/uL (ref 0.0–0.5)
HCT: 33.4 % — ABNORMAL LOW (ref 34.8–46.6)
HGB: 10.6 g/dL — ABNORMAL LOW (ref 11.6–15.9)
LYMPH%: 35.9 % (ref 14.0–49.7)
MCH: 31.9 pg (ref 25.1–34.0)
MCHC: 31.6 g/dL (ref 31.5–36.0)
MCV: 101 fL (ref 79.5–101.0)
MONO#: 0.5 10*3/uL (ref 0.1–0.9)
MONO%: 12.4 % (ref 0.0–14.0)
NEUT#: 1.9 10*3/uL (ref 1.5–6.5)
NEUT%: 48.7 % (ref 38.4–76.8)
Platelets: 203 10*3/uL (ref 145–400)
RBC: 3.31 10*6/uL — ABNORMAL LOW (ref 3.70–5.45)
RDW: 14.7 % — ABNORMAL HIGH (ref 11.2–14.5)
WBC: 4 10*3/uL (ref 3.9–10.3)
lymph#: 1.4 10*3/uL (ref 0.9–3.3)

## 2016-03-07 LAB — LACTATE DEHYDROGENASE: LDH: 166 U/L (ref 125–245)

## 2016-03-08 LAB — IGG, IGA, IGM
IgA, Qn, Serum: 379 mg/dL (ref 64–422)
IgG, Qn, Serum: 1288 mg/dL (ref 700–1600)
IgM, Qn, Serum: 20 mg/dL — ABNORMAL LOW (ref 26–217)

## 2016-03-08 LAB — KAPPA/LAMBDA LIGHT CHAINS
Ig Kappa Free Light Chain: 59.9 mg/L — ABNORMAL HIGH (ref 3.3–19.4)
Ig Lambda Free Light Chain: 32.6 mg/L — ABNORMAL HIGH (ref 5.7–26.3)
Kappa/Lambda FluidC Ratio: 1.84 — ABNORMAL HIGH (ref 0.26–1.65)

## 2016-03-08 LAB — BETA 2 MICROGLOBULIN, SERUM: Beta-2: 1.6 mg/L (ref 0.6–2.4)

## 2016-03-14 ENCOUNTER — Ambulatory Visit (HOSPITAL_BASED_OUTPATIENT_CLINIC_OR_DEPARTMENT_OTHER): Payer: Commercial Managed Care - HMO

## 2016-03-14 ENCOUNTER — Encounter: Payer: Self-pay | Admitting: Internal Medicine

## 2016-03-14 ENCOUNTER — Telehealth: Payer: Self-pay | Admitting: Internal Medicine

## 2016-03-14 ENCOUNTER — Ambulatory Visit (HOSPITAL_BASED_OUTPATIENT_CLINIC_OR_DEPARTMENT_OTHER): Payer: Commercial Managed Care - HMO | Admitting: Internal Medicine

## 2016-03-14 ENCOUNTER — Other Ambulatory Visit: Payer: Commercial Managed Care - HMO

## 2016-03-14 VITALS — BP 129/57 | HR 67 | Temp 98.3°F | Resp 18 | Ht 66.0 in | Wt 162.8 lb

## 2016-03-14 DIAGNOSIS — K591 Functional diarrhea: Secondary | ICD-10-CM

## 2016-03-14 DIAGNOSIS — D649 Anemia, unspecified: Secondary | ICD-10-CM

## 2016-03-14 DIAGNOSIS — C9000 Multiple myeloma not having achieved remission: Secondary | ICD-10-CM

## 2016-03-14 DIAGNOSIS — R0602 Shortness of breath: Secondary | ICD-10-CM

## 2016-03-14 DIAGNOSIS — K521 Toxic gastroenteritis and colitis: Secondary | ICD-10-CM | POA: Diagnosis not present

## 2016-03-14 DIAGNOSIS — G620 Drug-induced polyneuropathy: Secondary | ICD-10-CM

## 2016-03-14 DIAGNOSIS — R197 Diarrhea, unspecified: Secondary | ICD-10-CM | POA: Insufficient documentation

## 2016-03-14 MED ORDER — SODIUM CHLORIDE 0.9 % IV SOLN
Freq: Once | INTRAVENOUS | Status: AC
  Administered 2016-03-14: 10:00:00 via INTRAVENOUS

## 2016-03-14 MED ORDER — DIPHENOXYLATE-ATROPINE 2.5-0.025 MG PO TABS: 1.0000 | ORAL_TABLET | Freq: Four times a day (QID) | ORAL | 0 refills | Status: DC | PRN

## 2016-03-14 MED ORDER — ZOLEDRONIC ACID 4 MG/100ML IV SOLN
4.0000 mg | Freq: Once | INTRAVENOUS | Status: AC
  Administered 2016-03-14: 4 mg via INTRAVENOUS
  Filled 2016-03-14: qty 100

## 2016-03-14 NOTE — Progress Notes (Signed)
[place] [phone]   [phone]  OFFICE PROGRESS NOTE  [name], MD [address] [place] [address]  DIAGNOSIS: Multiple myeloma, Light chain diagnosed in August 2010  PRIOR THERAPY: 1) status post systemic chemotherapy with Velcade, Doxil and Decadron between 04/24/2009 through 07/27/2009 discontinued secondary to peripheral neuropathy. 2) status post treatment with Revlimid and Decadron between 09/01/2009 through December 2011. 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 Miki Kins.  CURRENT THERAPY: 1) maintenance chemotherapy with Revlimid 10 mg by mouth daily started June 2012. 2) Zometa 4 mg IV every 3 months. 3) Coumadin 2 mg by mouth daily.  INTERVAL HISTORY: Carmen Fisher 75 y.o. female returns to the clinic today for three-month follow-up visit. The patient is currently on treatment with maintenance Revlimid and tolerating it well except for diarrhea up to 5 times a day but not every day. She is currently on Imodium with mild improvement. She lost few pounds since her last visit. She continues to have shortness breath with exertion but no significant chest pain, cough or hemoptysis. The patient denied having any nausea or vomiting. She has no fever or chills. She had repeat myeloma panel performed recently and she is here for evaluation and discussion of her lab results.  MEDICAL HISTORY: Past Medical History:  Diagnosis Date  . Anemia, unspecified   . Chronic anticoagulation   . Dyslipidemia   . GERD (gastroesophageal reflux disease)   . Glaucoma   . Glaucoma   . Gout   . Hypertension   . Hypokalemia   . Multiple myeloma ([place])   . Multiple myeloma, without mention of having achieved remission   . Osteoporosis   . Peripheral neuropathy ([facility])     ALLERGIES:  is allergic to aspirin; aspirin; penicillins; sulfa  antibiotics; and sulfa antibiotics.  MEDICATIONS:  Current Outpatient Prescriptions  Medication Sig Dispense Refill  . acetaminophen (TYLENOL) 500 MG tablet Take 500 mg by mouth every 6 (six) hours as needed.      Marland Kitchen allopurinol (ZYLOPRIM) 300 MG tablet TAKE 1 TABLET EVERY DAY 90 tablet 0  . cholecalciferol (VITAMIN D) 1000 UNITS tablet Take 1,000 Units by mouth daily.    . Cranberry 250 MG TABS Take 250 mg by mouth 2 (two) times daily.    . Diphenhyd-Hydrocort-Nystatin (FIRST-DUKES MOUTHWASH) SUSP Swish and spit one teaspoon by mouth 4 times daily 240 mL 0  . gabapentin (NEURONTIN) 600 MG tablet TAKE 1 TABLET TWICE DAILY 180 tablet 0  . latanoprost (XALATAN) 0.005 % ophthalmic solution 1 drop at bedtime.      Marland Kitchen lenalidomide (REVLIMID) 10 MG capsule TAKE ONE 10 mg CAPSULE DAILY BY MOUTH FOR 28 days 28 capsule 0  . loperamide (IMODIUM) 2 MG capsule Take 1 capsule (2 mg total) by mouth as needed for diarrhea or loose stools (may take up to 6 /day). 30 capsule 1  . LORazepam (ATIVAN) 1 MG tablet Take 1 mg by mouth every 4 (four) hours as needed. Reported on 09/14/2015    . Multiple Vitamin (MULTIVITAMIN WITH MINERALS) TABS tablet Take 1 tablet by mouth daily.    . pantoprazole (PROTONIX) 40 MG tablet TAKE 1 TABLET EVERY DAY 90 tablet 0  . potassium chloride SA (K-DUR,KLOR-CON) 20 MEQ tablet TAKE 1 AND 1/2 TABLETS DAILY 135 tablet 0  . prochlorperazine (COMPAZINE) 10 MG tablet Take 10 mg by mouth every 6 (six)  hours as needed. Reported on 09/14/2015    . timolol (BETIMOL) 0.5 % ophthalmic solution 1 drop 2 (two) times daily.      Marland Kitchen warfarin (COUMADIN) 2 MG tablet TAKE 1 TABLET EVERY DAY 60 tablet 1  . zolendronic acid (ZOMETA) 4 MG/5ML injection Inject 4 mg into the vein every 3 (three) months.     No current facility-administered medications for this visit.     SURGICAL HISTORY:  Past Surgical History:  Procedure Laterality Date  . ABDOMINAL HYSTERECTOMY  1994   w/BSO  . APPENDECTOMY       REVIEW OF SYSTEMS:  Constitutional: positive for weight loss Eyes: negative Ears, nose, mouth, throat, and face: negative Respiratory: positive for dyspnea on exertion Cardiovascular: negative Gastrointestinal: positive for diarrhea Genitourinary:negative Integument/breast: negative Hematologic/lymphatic: negative Musculoskeletal:negative Neurological: negative Behavioral/Psych: negative Endocrine: negative Allergic/Immunologic: negative   PHYSICAL EXAMINATION: General appearance: alert, cooperative and no distress Head: Normocephalic, without obvious abnormality, atraumatic Neck: no adenopathy, no JVD, supple, symmetrical, trachea midline and thyroid not enlarged, symmetric, no tenderness/mass/nodules Lymph nodes: Cervical, supraclavicular, and axillary nodes normal. Resp: clear to auscultation bilaterally Back: symmetric, no curvature. ROM normal. No CVA tenderness. Cardio: regular rate and rhythm, S1, S2 normal, no murmur, click, rub or gallop GI: soft, non-tender; bowel sounds normal; no masses,  no organomegaly Extremities: extremities normal, atraumatic, no cyanosis or edema Neurologic: Alert and oriented X 3, normal strength and tone. Normal symmetric reflexes. Normal coordination and gait  ECOG PERFORMANCE STATUS: 1 - Symptomatic but completely ambulatory  Blood pressure (!) 129/57, pulse 67, temperature 98.3 F (36.8 C), temperature source Oral, resp. rate 18, height '5\' 6"'$  (1.676 m), weight 162 lb 12.8 oz (73.8 kg), SpO2 100 %.  LABORATORY DATA: Lab Results  Component Value Date   WBC 4.0 03/07/2016   HGB 10.6 (L) 03/07/2016   HCT 33.4 (L) 03/07/2016   MCV 101.0 03/07/2016   PLT 203 03/07/2016      Chemistry      Component Value Date/Time   NA 144 03/07/2016 1132   K 3.5 03/07/2016 1132   CL 109 12/08/2012 0640   CL 106 07/28/2012 0924   CO2 27 03/07/2016 1132   BUN 10.7 03/07/2016 1132   CREATININE 0.8 03/07/2016 1132      Component Value Date/Time    CALCIUM 9.2 03/07/2016 1132   ALKPHOS 111 03/07/2016 1132   AST 25 03/07/2016 1132   ALT 35 03/07/2016 1132   BILITOT 0.34 03/07/2016 1132       RADIOGRAPHIC STUDIES: No results found.  ASSESSMENT AND PLAN: This is a very pleasant 75 years old African-American female with: 1) multiple myeloma currently on maintenance treatment with Revlimid 10 mg by mouth daily. She is tolerating her treatment well with no significant adverse effects except for persistent diarrhea. Her multiple myeloma panel performed recently showed no clear evidence for disease progression. I discussed the lab result with the patient today. I recommended for her to continue her treatment with Revlimid. She would come back for follow-up visit in 3 months for reevaluation with repeat myeloma panel. 2) chemotherapy-induced diarrhea: I recommended for the patient to continue her treatment with Imodium and I started her also on Lomotil 1 tablet by mouth every 6 hours as needed. 3) peripheral neuropathy: She will continue on gabapentin. 4) anemia: I recommended for the patient to start taking over-the-counter oral iron tablets. The patient was advised to call immediately if she has any concerning symptoms in the interval. The patient voices understanding  of current disease status and treatment options and is in agreement with the current care plan.  All questions were answered. The patient knows to call the clinic with any problems, questions or concerns. We can certainly see the patient much sooner if necessary.  Disclaimer: This note was dictated with voice recognition software. Similar sounding words can inadvertently be transcribed and may not be corrected upon review.

## 2016-03-14 NOTE — Telephone Encounter (Signed)
Message sent to infusion scheduler to be added. Appointments scheduled per 03/14/16 los. Patient was given a copy of the AVS report and appointment schedule, per 03/14/16 los.

## 2016-03-14 NOTE — Patient Instructions (Signed)

## 2016-03-15 ENCOUNTER — Telehealth: Payer: Self-pay | Admitting: *Deleted

## 2016-03-15 NOTE — Telephone Encounter (Signed)
Per LOS I have scheduled appts and notified the scheduler 

## 2016-03-18 ENCOUNTER — Other Ambulatory Visit: Payer: Self-pay | Admitting: Internal Medicine

## 2016-03-19 DIAGNOSIS — H34831 Tributary (branch) retinal vein occlusion, right eye, with macular edema: Secondary | ICD-10-CM | POA: Diagnosis not present

## 2016-03-20 ENCOUNTER — Ambulatory Visit
Admission: RE | Admit: 2016-03-20 | Discharge: 2016-03-20 | Disposition: A | Discharge status: Home / Self Care | Payer: Commercial Managed Care - HMO | Source: Ambulatory Visit | Attending: Internal Medicine | Admitting: Internal Medicine

## 2016-03-20 DIAGNOSIS — Z1231 Encounter for screening mammogram for malignant neoplasm of breast: Secondary | ICD-10-CM | POA: Diagnosis not present

## 2016-03-21 DIAGNOSIS — Z23 Encounter for immunization: Secondary | ICD-10-CM | POA: Diagnosis not present

## 2016-03-21 DIAGNOSIS — N390 Urinary tract infection, site not specified: Secondary | ICD-10-CM | POA: Diagnosis not present

## 2016-03-21 DIAGNOSIS — I10 Essential (primary) hypertension: Secondary | ICD-10-CM | POA: Diagnosis not present

## 2016-03-21 DIAGNOSIS — Z0000 Encounter for general adult medical examination without abnormal findings: Secondary | ICD-10-CM | POA: Diagnosis not present

## 2016-03-21 DIAGNOSIS — E7800 Pure hypercholesterolemia, unspecified: Secondary | ICD-10-CM | POA: Diagnosis not present

## 2016-03-21 DIAGNOSIS — E559 Vitamin D deficiency, unspecified: Secondary | ICD-10-CM | POA: Diagnosis not present

## 2016-03-28 DIAGNOSIS — E559 Vitamin D deficiency, unspecified: Secondary | ICD-10-CM | POA: Diagnosis not present

## 2016-03-28 DIAGNOSIS — K219 Gastro-esophageal reflux disease without esophagitis: Secondary | ICD-10-CM | POA: Diagnosis not present

## 2016-03-28 DIAGNOSIS — N390 Urinary tract infection, site not specified: Secondary | ICD-10-CM | POA: Diagnosis not present

## 2016-03-28 DIAGNOSIS — Z0000 Encounter for general adult medical examination without abnormal findings: Secondary | ICD-10-CM | POA: Diagnosis not present

## 2016-03-29 ENCOUNTER — Telehealth: Payer: Self-pay | Admitting: Medical Oncology

## 2016-03-29 ENCOUNTER — Other Ambulatory Visit: Payer: Self-pay | Admitting: Medical Oncology

## 2016-03-29 DIAGNOSIS — C9000 Multiple myeloma not having achieved remission: Secondary | ICD-10-CM

## 2016-03-29 MED ORDER — LENALIDOMIDE 10 MG PO CAPS: ORAL_CAPSULE | ORAL | 0 refills | Status: DC

## 2016-03-29 NOTE — Telephone Encounter (Signed)
I left a message to complete [employer] survey.

## 2016-04-03 ENCOUNTER — Other Ambulatory Visit: Payer: Self-pay | Admitting: Medical Oncology

## 2016-04-03 DIAGNOSIS — C9000 Multiple myeloma not having achieved remission: Secondary | ICD-10-CM

## 2016-04-03 MED ORDER — LENALIDOMIDE 10 MG PO CAPS: ORAL_CAPSULE | ORAL | 0 refills | Status: DC

## 2016-04-23 DIAGNOSIS — H34831 Tributary (branch) retinal vein occlusion, right eye, with macular edema: Secondary | ICD-10-CM | POA: Diagnosis not present

## 2016-04-25 ENCOUNTER — Other Ambulatory Visit: Payer: Self-pay | Admitting: Internal Medicine

## 2016-04-29 ENCOUNTER — Other Ambulatory Visit: Payer: Self-pay | Admitting: Internal Medicine

## 2016-04-29 ENCOUNTER — Other Ambulatory Visit: Payer: Self-pay | Admitting: Medical Oncology

## 2016-04-29 DIAGNOSIS — C9000 Multiple myeloma not having achieved remission: Secondary | ICD-10-CM

## 2016-04-29 DIAGNOSIS — Z7901 Long term (current) use of anticoagulants: Secondary | ICD-10-CM

## 2016-04-29 MED ORDER — LENALIDOMIDE 10 MG PO CAPS: ORAL_CAPSULE | ORAL | 0 refills | Status: DC

## 2016-05-02 ENCOUNTER — Encounter: Payer: Self-pay | Admitting: Internal Medicine

## 2016-05-02 NOTE — Progress Notes (Signed)
Pt is approved w/ Patient Carmen Fisher for Revlimid from 04/30/16 to 04/30/17 for $10,000.

## 2016-05-20 ENCOUNTER — Other Ambulatory Visit: Payer: Self-pay | Admitting: Internal Medicine

## 2016-05-20 DIAGNOSIS — C9000 Multiple myeloma not having achieved remission: Secondary | ICD-10-CM

## 2016-05-21 ENCOUNTER — Other Ambulatory Visit: Payer: Self-pay | Admitting: Internal Medicine

## 2016-05-21 DIAGNOSIS — C9000 Multiple myeloma not having achieved remission: Secondary | ICD-10-CM

## 2016-05-22 MED ORDER — LENALIDOMIDE 10 MG PO CAPS: ORAL_CAPSULE | ORAL | 0 refills | Status: DC

## 2016-05-22 NOTE — Addendum Note (Signed)
Addended by: Ardeen Garland on: 05/22/2016 01:41 PM   Modules accepted: Orders

## 2016-05-28 DIAGNOSIS — H34831 Tributary (branch) retinal vein occlusion, right eye, with macular edema: Secondary | ICD-10-CM | POA: Diagnosis not present

## 2016-06-05 ENCOUNTER — Other Ambulatory Visit (HOSPITAL_BASED_OUTPATIENT_CLINIC_OR_DEPARTMENT_OTHER): Payer: Commercial Managed Care - HMO

## 2016-06-05 DIAGNOSIS — C9000 Multiple myeloma not having achieved remission: Secondary | ICD-10-CM

## 2016-06-05 DIAGNOSIS — K591 Functional diarrhea: Secondary | ICD-10-CM | POA: Diagnosis not present

## 2016-06-05 DIAGNOSIS — C7951 Secondary malignant neoplasm of bone: Secondary | ICD-10-CM

## 2016-06-05 LAB — COMPREHENSIVE METABOLIC PANEL
ALT: 20 U/L (ref 0–55)
AST: 18 U/L (ref 5–34)
Albumin: 3.7 g/dL (ref 3.5–5.0)
Alkaline Phosphatase: 70 U/L (ref 40–150)
Anion Gap: 5 mEq/L (ref 3–11)
BUN: 14.2 mg/dL (ref 7.0–26.0)
CO2: 29 mEq/L (ref 22–29)
Calcium: 9.4 mg/dL (ref 8.4–10.4)
Chloride: 107 mEq/L (ref 98–109)
Creatinine: 1.1 mg/dL (ref 0.6–1.1)
EGFR: 60 mL/min/{1.73_m2} — ABNORMAL LOW (ref >90)
Glucose: 86 mg/dl (ref 70–140)
Potassium: 3.9 mEq/L (ref 3.5–5.1)
Sodium: 142 mEq/L (ref 136–145)
Total Bilirubin: 0.4 mg/dL (ref 0.20–1.20)
Total Protein: 7.4 g/dL (ref 6.4–8.3)

## 2016-06-05 LAB — CBC WITH DIFFERENTIAL/PLATELET
BASO%: 1.1 % (ref 0.0–2.0)
Basophils Absolute: 0 10*3/uL (ref 0.0–0.1)
EOS%: 2.9 % (ref 0.0–7.0)
Eosinophils Absolute: 0.1 10*3/uL (ref 0.0–0.5)
HCT: 35.2 % (ref 34.8–46.6)
HGB: 11.3 g/dL — ABNORMAL LOW (ref 11.6–15.9)
LYMPH%: 50.7 % — ABNORMAL HIGH (ref 14.0–49.7)
MCH: 32.6 pg (ref 25.1–34.0)
MCHC: 32 g/dL (ref 31.5–36.0)
MCV: 101.8 fL — ABNORMAL HIGH (ref 79.5–101.0)
MONO#: 0.4 10*3/uL (ref 0.1–0.9)
MONO%: 11.5 % (ref 0.0–14.0)
NEUT#: 1.3 10*3/uL — ABNORMAL LOW (ref 1.5–6.5)
NEUT%: 33.8 % — ABNORMAL LOW (ref 38.4–76.8)
Platelets: 189 10*3/uL (ref 145–400)
RBC: 3.46 10*6/uL — ABNORMAL LOW (ref 3.70–5.45)
RDW: 14.7 % — ABNORMAL HIGH (ref 11.2–14.5)
WBC: 3.7 10*3/uL — ABNORMAL LOW (ref 3.9–10.3)
lymph#: 1.9 10*3/uL (ref 0.9–3.3)

## 2016-06-05 LAB — LACTATE DEHYDROGENASE: LDH: 153 U/L (ref 125–245)

## 2016-06-06 LAB — IGG, IGA, IGM
IgA, Qn, Serum: 403 mg/dL (ref 64–422)
IgG, Qn, Serum: 1390 mg/dL (ref 700–1600)
IgM, Qn, Serum: 15 mg/dL — ABNORMAL LOW (ref 26–217)

## 2016-06-06 LAB — BETA 2 MICROGLOBULIN, SERUM: Beta-2: 1.5 mg/L (ref 0.6–2.4)

## 2016-06-06 LAB — KAPPA/LAMBDA LIGHT CHAINS
Ig Kappa Free Light Chain: 58 mg/L — ABNORMAL HIGH (ref 3.3–19.4)
Ig Lambda Free Light Chain: 29.6 mg/L — ABNORMAL HIGH (ref 5.7–26.3)
Kappa/Lambda FluidC Ratio: 1.96 — ABNORMAL HIGH (ref 0.26–1.65)

## 2016-06-12 ENCOUNTER — Encounter: Payer: Self-pay | Admitting: Internal Medicine

## 2016-06-12 ENCOUNTER — Ambulatory Visit (HOSPITAL_BASED_OUTPATIENT_CLINIC_OR_DEPARTMENT_OTHER): Payer: Commercial Managed Care - HMO | Admitting: Internal Medicine

## 2016-06-12 ENCOUNTER — Other Ambulatory Visit: Payer: Commercial Managed Care - HMO

## 2016-06-12 ENCOUNTER — Ambulatory Visit (HOSPITAL_BASED_OUTPATIENT_CLINIC_OR_DEPARTMENT_OTHER): Payer: Commercial Managed Care - HMO

## 2016-06-12 ENCOUNTER — Ambulatory Visit: Payer: Commercial Managed Care - HMO

## 2016-06-12 DIAGNOSIS — C9000 Multiple myeloma not having achieved remission: Secondary | ICD-10-CM

## 2016-06-12 DIAGNOSIS — K521 Toxic gastroenteritis and colitis: Secondary | ICD-10-CM | POA: Diagnosis not present

## 2016-06-12 DIAGNOSIS — D638 Anemia in other chronic diseases classified elsewhere: Secondary | ICD-10-CM

## 2016-06-12 DIAGNOSIS — G620 Drug-induced polyneuropathy: Secondary | ICD-10-CM

## 2016-06-12 DIAGNOSIS — M899 Disorder of bone, unspecified: Secondary | ICD-10-CM

## 2016-06-12 MED ORDER — LENALIDOMIDE 5 MG PO CAPS: ORAL_CAPSULE | ORAL | 2 refills | Status: DC

## 2016-06-12 MED ORDER — ZOLEDRONIC ACID 4 MG/100ML IV SOLN
4.0000 mg | Freq: Once | INTRAVENOUS | Status: AC
  Administered 2016-06-12: 4 mg via INTRAVENOUS
  Filled 2016-06-12: qty 100

## 2016-06-12 NOTE — Patient Instructions (Signed)

## 2016-06-12 NOTE — Progress Notes (Signed)
[place] [phone]   [phone]  OFFICE PROGRESS NOTE  [name], MD [address] [place]  [place] [address]  DIAGNOSIS: Multiple myeloma, Light chain diagnosed in August 2010  PRIOR THERAPY: 1) status post systemic chemotherapy with Velcade, Doxil and Decadron between 04/24/2009 through 07/27/2009 discontinued secondary to peripheral neuropathy. 2) status post treatment with Revlimid and Decadron between 09/01/2009 through December 2011. 3) status post peripheral blood autologous stem cell transplant on 04/26/2010 at [facility] under the care of Dr. Miki Kins.  CURRENT THERAPY: 1) maintenance chemotherapy with Revlimid 10 mg by mouth daily started June 2012. This will be changed to 5 mg by mouth daily starting next week secondary to persistent diarrhea. 2) Zometa 4 mg IV every 3 months. 3) Coumadin 2 mg by mouth daily.  INTERVAL HISTORY: Carmen Fisher 76 y.o. female came to the clinic today for follow-up visit. The patient is feeling fine today with no specific complaints except for fatigue and persistent diarrhea. She denied having any recent weight loss or night sweats. She has no nausea, vomiting, constipation. She denied having any bleeding issues. She has no chest pain, shortness of breath, cough or hemoptysis. The patient is tolerating her treatment with Revlimid well except for the persistent diarrhea. She had repeat myeloma panel and she is here today for evaluation and discussion of her lab results and treatment options.   MEDICAL HISTORY: Past Medical History:  Diagnosis Date  . Anemia, unspecified   . Chronic anticoagulation   . Diarrhea 03/14/2016  . Dyslipidemia   . GERD (gastroesophageal reflux disease)   . Glaucoma   . Glaucoma   . Gout   . Hypertension   . Hypokalemia   . Multiple myeloma ([place])   . Multiple myeloma, without mention of having achieved remission   . Osteoporosis   .  Peripheral neuropathy ([facility])     ALLERGIES:  is allergic to aspirin; ampicillin; aspirin; penicillins; sulfa antibiotics; and sulfa antibiotics.  MEDICATIONS:  Current Outpatient Prescriptions  Medication Sig Dispense Refill  . acetaminophen (TYLENOL) 500 MG tablet Take 500 mg by mouth every 6 (six) hours as needed.      Marland Kitchen allopurinol (ZYLOPRIM) 300 MG tablet TAKE 1 TABLET EVERY DAY 90 tablet 0  . cholecalciferol (VITAMIN D) 1000 UNITS tablet Take 1,000 Units by mouth daily.    . Cranberry 250 MG TABS Take 250 mg by mouth 2 (two) times daily.    . Diphenhyd-Hydrocort-Nystatin (FIRST-DUKES MOUTHWASH) SUSP Swish and spit one teaspoon by mouth 4 times daily 240 mL 0  . diphenoxylate-atropine (LOMOTIL) 2.5-0.025 MG tablet Take 1 tablet by mouth 4 (four) times daily as needed for diarrhea or loose stools. 30 tablet 0  . gabapentin (NEURONTIN) 600 MG tablet TAKE 1 TABLET TWICE DAILY 180 tablet 0  . latanoprost (XALATAN) 0.005 % ophthalmic solution 1 drop at bedtime.      Marland Kitchen lenalidomide (REVLIMID) 10 MG capsule TAKE 1 CAPSULE BY MOUTH ONE TIME DAILY.Josem Kaufmann number [id number] 05/22/16 28 capsule 0  . loperamide (IMODIUM) 2 MG capsule Take 1 capsule (2 mg total) by mouth as needed for diarrhea or loose stools (may take up to 6 /day). 30 capsule 1  . LORazepam (ATIVAN) 1 MG tablet Take 1 mg by mouth every 4 (four) hours as needed. Reported on 09/14/2015    . Multiple Vitamin (MULTIVITAMIN WITH MINERALS) TABS tablet Take 1 tablet by mouth daily.    . pantoprazole (PROTONIX) 40 MG tablet  TAKE 1 TABLET EVERY DAY 90 tablet 0  . potassium chloride SA (K-DUR,KLOR-CON) 20 MEQ tablet TAKE 1 AND 1/2 TABLETS DAILY 135 tablet 0  . prochlorperazine (COMPAZINE) 10 MG tablet Take 10 mg by mouth every 6 (six) hours as needed. Reported on 09/14/2015    . timolol (BETIMOL) 0.5 % ophthalmic solution 1 drop 2 (two) times daily.      Marland Kitchen warfarin (COUMADIN) 2 MG tablet TAKE 1 TABLET EVERY DAY 90 tablet 1  . zolendronic acid (ZOMETA)  4 MG/5ML injection Inject 4 mg into the vein every 3 (three) months.     No current facility-administered medications for this visit.     SURGICAL HISTORY:  Past Surgical History:  Procedure Laterality Date  . ABDOMINAL HYSTERECTOMY  1994   w/BSO  . APPENDECTOMY      REVIEW OF SYSTEMS:  Constitutional: positive for fatigue Eyes: negative Ears, nose, mouth, throat, and face: negative Respiratory: negative Cardiovascular: negative Gastrointestinal: positive for diarrhea Genitourinary:negative Integument/breast: negative Hematologic/lymphatic: negative Musculoskeletal:negative Neurological: negative Behavioral/Psych: negative Endocrine: negative Allergic/Immunologic: negative   PHYSICAL EXAMINATION: General appearance: alert, cooperative, fatigued and no distress Head: Normocephalic, without obvious abnormality, atraumatic Neck: no adenopathy, no JVD, supple, symmetrical, trachea midline and thyroid not enlarged, symmetric, no tenderness/mass/nodules Lymph nodes: Cervical, supraclavicular, and axillary nodes normal. Resp: clear to auscultation bilaterally Back: symmetric, no curvature. ROM normal. No CVA tenderness. Cardio: regular rate and rhythm, S1, S2 normal, no murmur, click, rub or gallop GI: soft, non-tender; bowel sounds normal; no masses,  no organomegaly Extremities: extremities normal, atraumatic, no cyanosis or edema Neurologic: Alert and oriented X 3, normal strength and tone. Normal symmetric reflexes. Normal coordination and gait  ECOG PERFORMANCE STATUS: 1 - Symptomatic but completely ambulatory  Blood pressure 118/61, pulse 66, temperature 97.9 F (36.6 C), temperature source Oral, resp. rate 18, height 5' 6"  (1.676 m), weight 163 lb 9.6 oz (74.2 kg), SpO2 100 %.  LABORATORY DATA: Lab Results  Component Value Date   WBC 3.7 (L) 06/05/2016   HGB 11.3 (L) 06/05/2016   HCT 35.2 06/05/2016   MCV 101.8 (H) 06/05/2016   PLT 189 06/05/2016      Chemistry        Component Value Date/Time   NA 142 06/05/2016 1117   K 3.9 06/05/2016 1117   CL 109 12/08/2012 0640   CL 106 07/28/2012 0924   CO2 29 06/05/2016 1117   BUN 14.2 06/05/2016 1117   CREATININE 1.1 06/05/2016 1117      Component Value Date/Time   CALCIUM 9.4 06/05/2016 1117   ALKPHOS 70 06/05/2016 1117   AST 18 06/05/2016 1117   ALT 20 06/05/2016 1117   BILITOT 0.40 06/05/2016 1117       RADIOGRAPHIC STUDIES: No results found.  ASSESSMENT AND PLAN:  This is a very pleasant 76 years old African-American female with the following medical issues. 1) multiple myeloma currently on maintenance treatment with Revlimid 10 mg by mouth daily. She is to return to the treatment well except for persistent diarrhea up to 6 times a day. She had repeat myeloma panel performed recently that showed no evidence for progression. I discussed the lab result with the patient today. I recommended for her to continue maintenance treatment with Revlimid but I will reduce the dose to 5 mg by mouth daily. I will see her back for follow-up visit in 3 months for reevaluation and repeat myeloma panel. 2) chemotherapy induced diarrhea, I will reduce the dose of Revlimid 25 mg  by mouth daily. I also advised the patient to take Imodium after every episodes of diarrhea up to 16 mg by mouth daily. 3) anemia of chronic disease, she will continue with over-the-counter iron tablets. 4) peripheral neuropathy: The patient will continue on gabapentin. 5) bone disease: She will continue with Zometa every 3 months as ordered. The patient would come back for follow-up visit in 3 months. She was advised to call immediately if she has any concerning symptoms in the interval. The patient voices understanding of current disease status and treatment options and is in agreement with the current care plan.  All questions were answered. The patient knows to call the clinic with any problems, questions or concerns. We can certainly  see the patient much sooner if necessary.  Disclaimer: This note was dictated with voice recognition software. Similar sounding words can inadvertently be transcribed and may not be corrected upon review.

## 2016-06-14 ENCOUNTER — Other Ambulatory Visit: Payer: Self-pay | Admitting: Medical Oncology

## 2016-06-14 DIAGNOSIS — C9000 Multiple myeloma not having achieved remission: Secondary | ICD-10-CM

## 2016-06-14 MED ORDER — LENALIDOMIDE 5 MG PO CAPS: ORAL_CAPSULE | ORAL | 0 refills | Status: DC

## 2016-06-14 NOTE — Progress Notes (Signed)
revlimd refill faxed to pharmacy

## 2016-06-19 ENCOUNTER — Other Ambulatory Visit: Payer: Self-pay | Admitting: Medical Oncology

## 2016-06-19 DIAGNOSIS — C9000 Multiple myeloma not having achieved remission: Secondary | ICD-10-CM

## 2016-06-19 DIAGNOSIS — R197 Diarrhea, unspecified: Secondary | ICD-10-CM

## 2016-06-19 MED ORDER — LOPERAMIDE HCL 2 MG PO CAPS: 2.0000 mg | ORAL_CAPSULE | ORAL | 1 refills | Status: DC | PRN

## 2016-06-19 MED ORDER — LENALIDOMIDE 5 MG PO CAPS: ORAL_CAPSULE | ORAL | 0 refills | Status: DC

## 2016-06-21 ENCOUNTER — Other Ambulatory Visit: Payer: Self-pay | Admitting: Medical Oncology

## 2016-06-21 DIAGNOSIS — C9000 Multiple myeloma not having achieved remission: Secondary | ICD-10-CM

## 2016-06-21 DIAGNOSIS — K591 Functional diarrhea: Secondary | ICD-10-CM

## 2016-06-25 ENCOUNTER — Other Ambulatory Visit: Payer: Self-pay | Admitting: Medical Oncology

## 2016-07-03 DIAGNOSIS — H34831 Tributary (branch) retinal vein occlusion, right eye, with macular edema: Secondary | ICD-10-CM | POA: Diagnosis not present

## 2016-07-15 ENCOUNTER — Other Ambulatory Visit: Payer: Self-pay | Admitting: Medical Oncology

## 2016-07-15 DIAGNOSIS — C9000 Multiple myeloma not having achieved remission: Secondary | ICD-10-CM

## 2016-07-15 MED ORDER — LENALIDOMIDE 5 MG PO CAPS: ORAL_CAPSULE | ORAL | 0 refills | Status: DC

## 2016-07-27 ENCOUNTER — Other Ambulatory Visit: Payer: Self-pay | Admitting: Internal Medicine

## 2016-08-01 ENCOUNTER — Other Ambulatory Visit: Payer: Self-pay | Admitting: Internal Medicine

## 2016-08-01 MED ORDER — GABAPENTIN 600 MG PO TABS: 600.0000 mg | ORAL_TABLET | Freq: Two times a day (BID) | ORAL | 0 refills | Status: DC

## 2016-08-01 NOTE — Telephone Encounter (Signed)
Pt called for medication refill of gabapentin. She requested it be sent to [facility] rather than [facility] order b/c she ran out. Rx refilled per protocol. Requested she call us in late June to give time to process if she wants it refilled at mail order pharmacy next time.

## 2016-08-08 DIAGNOSIS — H34831 Tributary (branch) retinal vein occlusion, right eye, with macular edema: Secondary | ICD-10-CM | POA: Diagnosis not present

## 2016-08-13 ENCOUNTER — Other Ambulatory Visit: Payer: Self-pay | Admitting: Medical Oncology

## 2016-08-13 DIAGNOSIS — C9000 Multiple myeloma not having achieved remission: Secondary | ICD-10-CM

## 2016-08-13 MED ORDER — LENALIDOMIDE 5 MG PO CAPS: ORAL_CAPSULE | ORAL | 0 refills | Status: DC

## 2016-08-22 DIAGNOSIS — C9001 Multiple myeloma in remission: Secondary | ICD-10-CM | POA: Diagnosis not present

## 2016-08-22 DIAGNOSIS — C9000 Multiple myeloma not having achieved remission: Secondary | ICD-10-CM | POA: Diagnosis not present

## 2016-08-27 ENCOUNTER — Other Ambulatory Visit: Payer: Self-pay | Admitting: Internal Medicine

## 2016-09-04 ENCOUNTER — Other Ambulatory Visit: Payer: Self-pay | Admitting: Internal Medicine

## 2016-09-04 DIAGNOSIS — R197 Diarrhea, unspecified: Secondary | ICD-10-CM

## 2016-09-10 DIAGNOSIS — H34831 Tributary (branch) retinal vein occlusion, right eye, with macular edema: Secondary | ICD-10-CM | POA: Diagnosis not present

## 2016-09-12 ENCOUNTER — Ambulatory Visit (HOSPITAL_BASED_OUTPATIENT_CLINIC_OR_DEPARTMENT_OTHER): Payer: Medicare HMO | Admitting: Internal Medicine

## 2016-09-12 ENCOUNTER — Other Ambulatory Visit (HOSPITAL_BASED_OUTPATIENT_CLINIC_OR_DEPARTMENT_OTHER): Payer: Medicare HMO

## 2016-09-12 ENCOUNTER — Telehealth: Payer: Self-pay | Admitting: Internal Medicine

## 2016-09-12 ENCOUNTER — Ambulatory Visit (HOSPITAL_BASED_OUTPATIENT_CLINIC_OR_DEPARTMENT_OTHER): Payer: Medicare HMO

## 2016-09-12 ENCOUNTER — Encounter: Payer: Self-pay | Admitting: Internal Medicine

## 2016-09-12 VITALS — BP 141/65 | HR 68 | Temp 97.2°F | Resp 18 | Ht 66.0 in | Wt 160.1 lb

## 2016-09-12 DIAGNOSIS — M25551 Pain in right hip: Secondary | ICD-10-CM | POA: Diagnosis not present

## 2016-09-12 DIAGNOSIS — C9000 Multiple myeloma not having achieved remission: Secondary | ICD-10-CM | POA: Diagnosis not present

## 2016-09-12 DIAGNOSIS — M898X9 Other specified disorders of bone, unspecified site: Secondary | ICD-10-CM | POA: Diagnosis not present

## 2016-09-12 DIAGNOSIS — G620 Drug-induced polyneuropathy: Secondary | ICD-10-CM

## 2016-09-12 DIAGNOSIS — R197 Diarrhea, unspecified: Secondary | ICD-10-CM

## 2016-09-12 DIAGNOSIS — D701 Agranulocytosis secondary to cancer chemotherapy: Secondary | ICD-10-CM

## 2016-09-12 DIAGNOSIS — T451X5A Adverse effect of antineoplastic and immunosuppressive drugs, initial encounter: Secondary | ICD-10-CM

## 2016-09-12 LAB — CBC WITH DIFFERENTIAL/PLATELET
BASO%: 1.2 % (ref 0.0–2.0)
Basophils Absolute: 0 10*3/uL (ref 0.0–0.1)
EOS%: 2 % (ref 0.0–7.0)
Eosinophils Absolute: 0.1 10*3/uL (ref 0.0–0.5)
HCT: 36.2 % (ref 34.8–46.6)
HGB: 11.8 g/dL (ref 11.6–15.9)
LYMPH%: 46.9 % (ref 14.0–49.7)
MCH: 32.9 pg (ref 25.1–34.0)
MCHC: 32.5 g/dL (ref 31.5–36.0)
MCV: 101.1 fL — ABNORMAL HIGH (ref 79.5–101.0)
MONO#: 0.4 10*3/uL (ref 0.1–0.9)
MONO%: 11.2 % (ref 0.0–14.0)
NEUT#: 1.3 10*3/uL — ABNORMAL LOW (ref 1.5–6.5)
NEUT%: 38.7 % (ref 38.4–76.8)
Platelets: 187 10*3/uL (ref 145–400)
RBC: 3.58 10*6/uL — ABNORMAL LOW (ref 3.70–5.45)
RDW: 14.3 % (ref 11.2–14.5)
WBC: 3.3 10*3/uL — ABNORMAL LOW (ref 3.9–10.3)
lymph#: 1.6 10*3/uL (ref 0.9–3.3)

## 2016-09-12 LAB — COMPREHENSIVE METABOLIC PANEL
ALT: 20 U/L (ref 0–55)
AST: 20 U/L (ref 5–34)
Albumin: 3.7 g/dL (ref 3.5–5.0)
Alkaline Phosphatase: 71 U/L (ref 40–150)
Anion Gap: 7 mEq/L (ref 3–11)
BUN: 10.7 mg/dL (ref 7.0–26.0)
CO2: 26 mEq/L (ref 22–29)
Calcium: 9.5 mg/dL (ref 8.4–10.4)
Chloride: 108 mEq/L (ref 98–109)
Creatinine: 1 mg/dL (ref 0.6–1.1)
EGFR: 68 mL/min/{1.73_m2} — ABNORMAL LOW (ref >90)
Glucose: 96 mg/dl (ref 70–140)
Potassium: 3.8 mEq/L (ref 3.5–5.1)
Sodium: 142 mEq/L (ref 136–145)
Total Bilirubin: 0.64 mg/dL (ref 0.20–1.20)
Total Protein: 7.4 g/dL (ref 6.4–8.3)

## 2016-09-12 LAB — LACTATE DEHYDROGENASE: LDH: 153 U/L (ref 125–245)

## 2016-09-12 MED ORDER — SODIUM CHLORIDE 0.9 % IV SOLN
INTRAVENOUS | Status: DC
Start: 2016-09-12 — End: 2016-09-12
  Administered 2016-09-12: 13:00:00 via INTRAVENOUS

## 2016-09-12 MED ORDER — SODIUM CHLORIDE 0.9 % IJ SOLN
3.0000 mL | Freq: Once | INTRAMUSCULAR | Status: DC | PRN
Start: 2016-09-12 — End: 2016-09-12
  Filled 2016-09-12: qty 10

## 2016-09-12 MED ORDER — HEPARIN SOD (PORK) LOCK FLUSH 100 UNIT/ML IV SOLN
500.0000 [IU] | Freq: Once | INTRAVENOUS | Status: DC | PRN
  Filled 2016-09-12: qty 5

## 2016-09-12 MED ORDER — SODIUM CHLORIDE 0.9 % IJ SOLN
10.0000 mL | INTRAMUSCULAR | Status: DC | PRN
  Filled 2016-09-12: qty 10

## 2016-09-12 MED ORDER — HEPARIN SOD (PORK) LOCK FLUSH 100 UNIT/ML IV SOLN
250.0000 [IU] | Freq: Once | INTRAVENOUS | Status: DC | PRN
  Filled 2016-09-12: qty 5

## 2016-09-12 MED ORDER — ALTEPLASE 2 MG IJ SOLR
2.0000 mg | Freq: Once | INTRAMUSCULAR | Status: DC | PRN
Start: 2016-09-12 — End: 2016-09-12
  Filled 2016-09-12: qty 2

## 2016-09-12 MED ORDER — ZOLEDRONIC ACID 4 MG/100ML IV SOLN
4.0000 mg | Freq: Once | INTRAVENOUS | Status: AC
  Administered 2016-09-12: 4 mg via INTRAVENOUS
  Filled 2016-09-12: qty 100

## 2016-09-12 NOTE — Progress Notes (Signed)
[place] [phone]   Fax:(336) [place], [place] [address] [place] [address]  DIAGNOSIS: Multiple myeloma, Light chain diagnosed in August 2010  PRIOR THERAPY: 1) status post systemic chemotherapy with Velcade, Doxil and Decadron between 04/24/2009 through 07/27/2009 discontinued secondary to peripheral neuropathy. 2) status post treatment with Revlimid and Decadron between 09/01/2009 through December 2011. 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 Miki Kins.  CURRENT THERAPY: 1) maintenance chemotherapy with Revlimid 10 mg by mouth daily started June 2012. This will be changed to 5 mg by mouth daily starting next week secondary to persistent diarrhea. 2) Zometa 4 mg IV every 3 months. 3) Coumadin 2 mg by mouth daily.  INTERVAL HISTORY: Carmen Fisher 76 y.o. female returns to the clinic today for follow-up visit. The patient is feeling fine today with no specific complaints except for the frequent episodes of diarrhea. She is currently on Revlimid 5 mg by mouth daily and still have several episodes of diarrhea. She was seen at [facility] by her transplant team and the give her the option of discontinuing Revlimid at this point. The patient is interested in this option. She denied having any chest pain, shortness of breath, cough or hemoptysis. She denied having any fever or chills. She has no nausea, vomiting or constipation. She has intermittent pain in the right hip area. She denied having any skin rash. She is here today for evaluation and repeat myeloma panel.   MEDICAL HISTORY: Past Medical History:  Diagnosis Date  . Anemia, unspecified   . Chronic anticoagulation   . Diarrhea 03/14/2016  . Dyslipidemia   . GERD (gastroesophageal reflux disease)   . Glaucoma   . Glaucoma   . Gout   .  Hypertension   . Hypokalemia   . Multiple myeloma ([place])   . Multiple myeloma, without mention of having achieved remission   . Osteoporosis   . Peripheral neuropathy ([facility])     ALLERGIES:  is allergic to aspirin; ampicillin; aspirin; penicillins; sulfa antibiotics; and sulfa antibiotics.  MEDICATIONS:  Current Outpatient Prescriptions  Medication Sig Dispense Refill  . acetaminophen (TYLENOL) 500 MG tablet Take 500 mg by mouth every 6 (six) hours as needed.      Marland Kitchen allopurinol (ZYLOPRIM) 300 MG tablet TAKE 1 TABLET EVERY DAY 90 tablet 0  . cholecalciferol (VITAMIN D) 1000 UNITS tablet Take 1,000 Units by mouth daily.    . Cranberry 250 MG TABS Take 250 mg by mouth 2 (two) times daily.    . Diphenhyd-Hydrocort-Nystatin (FIRST-DUKES MOUTHWASH) SUSP Swish and spit one teaspoon by mouth 4 times daily 240 mL 0  . diphenoxylate-atropine (LOMOTIL) 2.5-0.025 MG tablet Take 1 tablet by mouth 4 (four) times daily as needed for diarrhea or loose stools. 30 tablet 0  . gabapentin (NEURONTIN) 600 MG tablet TAKE 1 TABLET TWICE DAILY 180 tablet 0  . latanoprost (XALATAN) 0.005 % ophthalmic solution 1 drop at bedtime.      Marland Kitchen lenalidomide (REVLIMID) 5 MG capsule TAKE 1 CAPSULE BY MOUTH ONE TIME DAILY. 28 capsule 0  . loperamide (IMODIUM) 2 MG capsule TAKE 1 CAPSULE AS NEEDED  FOR  DIARRHEA  OR  LOOSE  STOOL.  TAKE UP TO 6 CAPS DAILY 30 capsule 1  . LORazepam (ATIVAN) 1 MG tablet Take 1 mg by mouth every 4 (four) hours as  needed. Reported on 09/14/2015    . Multiple Vitamin (MULTIVITAMIN WITH MINERALS) TABS tablet Take 1 tablet by mouth daily.    . pantoprazole (PROTONIX) 40 MG tablet TAKE 1 TABLET EVERY DAY 90 tablet 0  . Potassium Chloride ER 20 MEQ TBCR TAKE 1 AND 1/2 TABLETS DAILY 135 tablet 0  . prochlorperazine (COMPAZINE) 10 MG tablet Take 10 mg by mouth every 6 (six) hours as needed. Reported on 09/14/2015    . timolol (BETIMOL) 0.5 % ophthalmic solution 1 drop 2 (two) times daily.      Marland Kitchen warfarin  (COUMADIN) 2 MG tablet TAKE 1 TABLET EVERY DAY 90 tablet 1  . zolendronic acid (ZOMETA) 4 MG/5ML injection Inject 4 mg into the vein every 3 (three) months.     No current facility-administered medications for this visit.     SURGICAL HISTORY:  Past Surgical History:  Procedure Laterality Date  . ABDOMINAL HYSTERECTOMY  1994   w/BSO  . APPENDECTOMY      REVIEW OF SYSTEMS:  Constitutional: positive for fatigue Eyes: negative Ears, nose, mouth, throat, and face: negative Respiratory: negative Cardiovascular: negative Gastrointestinal: positive for diarrhea Genitourinary:negative Integument/breast: negative Hematologic/lymphatic: negative Musculoskeletal:negative Neurological: negative Behavioral/Psych: negative Endocrine: negative Allergic/Immunologic: negative   PHYSICAL EXAMINATION: General appearance: alert, cooperative, fatigued and no distress Head: Normocephalic, without obvious abnormality, atraumatic Neck: no adenopathy, no JVD, supple, symmetrical, trachea midline and thyroid not enlarged, symmetric, no tenderness/mass/nodules Lymph nodes: Cervical, supraclavicular, and axillary nodes normal. Resp: clear to auscultation bilaterally Back: symmetric, no curvature. ROM normal. No CVA tenderness. Cardio: regular rate and rhythm, S1, S2 normal, no murmur, click, rub or gallop GI: soft, non-tender; bowel sounds normal; no masses,  no organomegaly Extremities: extremities normal, atraumatic, no cyanosis or edema Neurologic: Alert and oriented X 3, normal strength and tone. Normal symmetric reflexes. Normal coordination and gait  ECOG PERFORMANCE STATUS: 1 - Symptomatic but completely ambulatory  Blood pressure (!) 141/65, pulse 68, temperature 97.2 F (36.2 C), temperature source Oral, resp. rate 18, height 5' 6"  (1.676 m), weight 160 lb 1.6 oz (72.6 kg), SpO2 100 %.  LABORATORY DATA: Lab Results  Component Value Date   WBC 3.3 (L) 09/12/2016   HGB 11.8 09/12/2016    HCT 36.2 09/12/2016   MCV 101.1 (H) 09/12/2016   PLT 187 09/12/2016      Chemistry      Component Value Date/Time   NA 142 09/12/2016 1038   K 3.8 09/12/2016 1038   CL 109 12/08/2012 0640   CL 106 07/28/2012 0924   CO2 26 09/12/2016 1038   BUN 10.7 09/12/2016 1038   CREATININE 1.0 09/12/2016 1038      Component Value Date/Time   CALCIUM 9.5 09/12/2016 1038   ALKPHOS 71 09/12/2016 1038   AST 20 09/12/2016 1038   ALT 20 09/12/2016 1038   BILITOT 0.64 09/12/2016 1038       RADIOGRAPHIC STUDIES: No results found.  ASSESSMENT AND PLAN:  This is a very pleasant 76 years old African-American female with multiple myeloma and has been on maintenance Revlimid currently 5 mg by mouth daily and has been on this treatment for almost 6 years now. The patient continues to have persistent diarrhea from her treatment and she is interested in stopping the treatment at this point. She had a panel performed earlier today but the results are not available yet. I agreed with her decision to discontinue treatment with Revlimid at this point. I will continue to monitor her closely with repeat  myeloma panel in 3 months and if the patient develop any disease progression, she will be considered for treatment with other options. For the intermittent right hip pain, this could be secondary to arthritis or lytic lesion, we'll continue to monitor for now and if persisted will order MRI of the right hip for further evaluation. For the peripheral neuropathy she would continue her current treatment with gabapentin. For the bone disease, the patient will continue her treatment with Zometa. She was advised to call immediately if she has any concerning symptoms in the interval. The patient voices understanding of current disease status and treatment options and is in agreement with the current care plan.  All questions were answered. The patient knows to call the clinic with any problems, questions or concerns.  We can certainly see the patient much sooner if necessary.  Disclaimer: This note was dictated with voice recognition software. Similar sounding words can inadvertently be transcribed and may not be corrected upon review.

## 2016-09-12 NOTE — Patient Instructions (Signed)

## 2016-09-12 NOTE — Telephone Encounter (Signed)
Appt already scheduled per 6/14 los - no additional appts added - Gave patient AVS and calender.

## 2016-09-13 LAB — BETA 2 MICROGLOBULIN, SERUM: Beta-2: 1.6 mg/L (ref 0.6–2.4)

## 2016-09-13 LAB — IGG, IGA, IGM
IgA, Qn, Serum: 376 mg/dL (ref 64–422)
IgG, Qn, Serum: 1353 mg/dL (ref 700–1600)
IgM, Qn, Serum: 15 mg/dL — ABNORMAL LOW (ref 26–217)

## 2016-09-13 LAB — KAPPA/LAMBDA LIGHT CHAINS
Ig Kappa Free Light Chain: 51.6 mg/L — ABNORMAL HIGH (ref 3.3–19.4)
Ig Lambda Free Light Chain: 27.1 mg/L — ABNORMAL HIGH (ref 5.7–26.3)
Kappa/Lambda FluidC Ratio: 1.9 — ABNORMAL HIGH (ref 0.26–1.65)

## 2016-09-17 ENCOUNTER — Telehealth: Payer: Self-pay | Admitting: Medical Oncology

## 2016-09-17 NOTE — Telephone Encounter (Signed)
Per Julien Nordmann I told pt to stop Revlimid for now as she wanted to and [facility] told her to stop it for now. Marlana Salvage wil get labs checked again in sept.

## 2016-09-18 ENCOUNTER — Other Ambulatory Visit: Payer: Self-pay | Admitting: *Deleted

## 2016-09-18 DIAGNOSIS — C9000 Multiple myeloma not having achieved remission: Secondary | ICD-10-CM

## 2016-09-18 MED ORDER — LENALIDOMIDE 5 MG PO CAPS: ORAL_CAPSULE | ORAL | 0 refills | Status: DC

## 2016-09-18 NOTE — Telephone Encounter (Signed)
Error- Revlimid refill cancelled.  Call placed to [facility].

## 2016-09-23 ENCOUNTER — Other Ambulatory Visit: Payer: Self-pay | Admitting: Internal Medicine

## 2016-09-23 DIAGNOSIS — R197 Diarrhea, unspecified: Secondary | ICD-10-CM

## 2016-09-27 DIAGNOSIS — M25551 Pain in right hip: Secondary | ICD-10-CM | POA: Diagnosis not present

## 2016-09-27 DIAGNOSIS — M1611 Unilateral primary osteoarthritis, right hip: Secondary | ICD-10-CM | POA: Diagnosis not present

## 2016-10-14 ENCOUNTER — Telehealth: Payer: Self-pay | Admitting: Medical Oncology

## 2016-10-14 NOTE — Telephone Encounter (Signed)
I told Diplomat that revlimid has been stopped per [facility].

## 2016-10-16 ENCOUNTER — Other Ambulatory Visit: Payer: Self-pay | Admitting: Internal Medicine

## 2016-10-16 DIAGNOSIS — R197 Diarrhea, unspecified: Secondary | ICD-10-CM

## 2016-10-17 DIAGNOSIS — H34831 Tributary (branch) retinal vein occlusion, right eye, with macular edema: Secondary | ICD-10-CM | POA: Diagnosis not present

## 2016-10-25 ENCOUNTER — Other Ambulatory Visit: Payer: Self-pay | Admitting: *Deleted

## 2016-10-25 NOTE — Patient Outreach (Signed)
[employer] High Risk Outreach. No one answered the phone today but I did leave a message and requested a return call. If I do not hear back from Mrs. Baray I will call her on Monday.  Eulah Pont. Myrtie Neither, MSN, [facility] Gerontological Nurse Practitioner [facility] Care Management [phone]

## 2016-10-28 ENCOUNTER — Ambulatory Visit: Payer: Self-pay | Admitting: *Deleted

## 2016-10-28 ENCOUNTER — Other Ambulatory Visit: Payer: Self-pay | Admitting: *Deleted

## 2016-10-28 NOTE — Patient Outreach (Signed)
[employer] High Risk Screen. Mrs. Capshaw has been undergoing treatment for Multiple Myeloma. She has completed her Infusion chemo and oral chemo meds for now. She is independent and good family support. She does receive special assistance for her medications and does not know  If she has ever gone into the donut hole. She has agreed to have nursing care management services. This would be for support more than anything as she does not have any of our usual disease entities that we deal with.   Carmen Fisher. Myrtie Neither, MSN, [facility] Gerontological Nurse Practitioner [facility] Care Management [phone]

## 2016-11-04 ENCOUNTER — Other Ambulatory Visit: Payer: Self-pay

## 2016-11-04 NOTE — Patient Outreach (Signed)
    Unsuccessful attempt made to contact patient via telephone for community care coordination. HIPPA compliant message left on patient's voicemail with this RNCM's contact information with a request for a return call

## 2016-11-05 ENCOUNTER — Other Ambulatory Visit: Payer: Self-pay

## 2016-11-05 NOTE — Patient Outreach (Signed)
    This RNCM made 3 unsuccessful attempts to contact patient via telephone. Attempts made on 10/28/2016, 11/04/2016 and today, 11/05/2016 HIPPA compliant messages left with all 3 unsuccessful attempts at numbers listed as contacts for patient.   Plan: Will discharge patient if call is not returned in 10 business days.

## 2016-11-14 DIAGNOSIS — R1310 Dysphagia, unspecified: Secondary | ICD-10-CM | POA: Diagnosis not present

## 2016-11-14 DIAGNOSIS — R5383 Other fatigue: Secondary | ICD-10-CM | POA: Diagnosis not present

## 2016-11-15 DIAGNOSIS — H401131 Primary open-angle glaucoma, bilateral, mild stage: Secondary | ICD-10-CM | POA: Diagnosis not present

## 2016-11-18 DIAGNOSIS — R749 Abnormal serum enzyme level, unspecified: Secondary | ICD-10-CM | POA: Diagnosis not present

## 2016-11-20 ENCOUNTER — Other Ambulatory Visit: Payer: Self-pay | Admitting: Internal Medicine

## 2016-11-20 DIAGNOSIS — R748 Abnormal levels of other serum enzymes: Secondary | ICD-10-CM

## 2016-11-21 DIAGNOSIS — H34831 Tributary (branch) retinal vein occlusion, right eye, with macular edema: Secondary | ICD-10-CM | POA: Diagnosis not present

## 2016-12-04 ENCOUNTER — Ambulatory Visit
Admission: RE | Admit: 2016-12-04 | Discharge: 2016-12-04 | Disposition: A | Discharge status: Home / Self Care | Payer: Medicare HMO | Source: Ambulatory Visit | Attending: Internal Medicine | Admitting: Internal Medicine

## 2016-12-04 DIAGNOSIS — K7689 Other specified diseases of liver: Secondary | ICD-10-CM | POA: Diagnosis not present

## 2016-12-04 DIAGNOSIS — R748 Abnormal levels of other serum enzymes: Secondary | ICD-10-CM

## 2016-12-04 DIAGNOSIS — N200 Calculus of kidney: Secondary | ICD-10-CM | POA: Diagnosis not present

## 2016-12-05 ENCOUNTER — Other Ambulatory Visit: Payer: Self-pay

## 2016-12-05 ENCOUNTER — Other Ambulatory Visit: Payer: Self-pay | Admitting: Internal Medicine

## 2016-12-05 DIAGNOSIS — R197 Diarrhea, unspecified: Secondary | ICD-10-CM

## 2016-12-06 ENCOUNTER — Encounter: Payer: Self-pay | Admitting: *Deleted

## 2016-12-06 ENCOUNTER — Other Ambulatory Visit: Payer: Self-pay | Admitting: *Deleted

## 2016-12-06 NOTE — Patient Outreach (Signed)
[facility]) Care Management  12/06/2016  Carmen Fisher [birth year] [phone]   Notified by care management assistant of need to contact after receiving call concerning case closure letter.  Call placed to member, this care manager introduced self and stated purpose of call.  [facility] care management services explained.  She state she is independent in care and has no major concerns at this time.  She denies offer to be involved with [facility], but does agree to contact if needs change.  Provided member with contact information for this care manager.  Will send primary MD case closure letter, will notify care management assistant to keep case closed at this time.  Valente David, [place], MSN [place] [phone]

## 2016-12-06 NOTE — Telephone Encounter (Signed)
This encounter was created in error - please disregard.

## 2016-12-12 ENCOUNTER — Ambulatory Visit (HOSPITAL_BASED_OUTPATIENT_CLINIC_OR_DEPARTMENT_OTHER): Payer: Medicare HMO | Admitting: Medical

## 2016-12-12 ENCOUNTER — Ambulatory Visit (HOSPITAL_BASED_OUTPATIENT_CLINIC_OR_DEPARTMENT_OTHER): Payer: Medicare HMO | Admitting: Internal Medicine

## 2016-12-12 ENCOUNTER — Other Ambulatory Visit (HOSPITAL_BASED_OUTPATIENT_CLINIC_OR_DEPARTMENT_OTHER): Payer: Medicare HMO

## 2016-12-12 ENCOUNTER — Telehealth: Payer: Self-pay

## 2016-12-12 ENCOUNTER — Encounter: Payer: Self-pay | Admitting: Internal Medicine

## 2016-12-12 VITALS — BP 128/66 | HR 72 | Temp 98.2°F | Resp 19 | Ht 66.0 in | Wt 157.1 lb

## 2016-12-12 DIAGNOSIS — K521 Toxic gastroenteritis and colitis: Secondary | ICD-10-CM

## 2016-12-12 DIAGNOSIS — M899 Disorder of bone, unspecified: Secondary | ICD-10-CM

## 2016-12-12 DIAGNOSIS — G620 Drug-induced polyneuropathy: Secondary | ICD-10-CM

## 2016-12-12 DIAGNOSIS — C9000 Multiple myeloma not having achieved remission: Secondary | ICD-10-CM

## 2016-12-12 DIAGNOSIS — D701 Agranulocytosis secondary to cancer chemotherapy: Secondary | ICD-10-CM

## 2016-12-12 DIAGNOSIS — R197 Diarrhea, unspecified: Secondary | ICD-10-CM

## 2016-12-12 DIAGNOSIS — T451X5A Adverse effect of antineoplastic and immunosuppressive drugs, initial encounter: Secondary | ICD-10-CM

## 2016-12-12 LAB — CBC WITH DIFFERENTIAL/PLATELET
BASO%: 0.9 % (ref 0.0–2.0)
Basophils Absolute: 0 10*3/uL (ref 0.0–0.1)
EOS%: 0.7 % (ref 0.0–7.0)
Eosinophils Absolute: 0 10*3/uL (ref 0.0–0.5)
HCT: 32.3 % — ABNORMAL LOW (ref 34.8–46.6)
HGB: 10.5 g/dL — ABNORMAL LOW (ref 11.6–15.9)
LYMPH%: 31.7 % (ref 14.0–49.7)
MCH: 32.8 pg (ref 25.1–34.0)
MCHC: 32.5 g/dL (ref 31.5–36.0)
MCV: 100.8 fL (ref 79.5–101.0)
MONO#: 0.3 10*3/uL (ref 0.1–0.9)
MONO%: 6.7 % (ref 0.0–14.0)
NEUT#: 2.9 10*3/uL (ref 1.5–6.5)
NEUT%: 60 % (ref 38.4–76.8)
Platelets: 312 10*3/uL (ref 145–400)
RBC: 3.2 10*6/uL — ABNORMAL LOW (ref 3.70–5.45)
RDW: 14.7 % — ABNORMAL HIGH (ref 11.2–14.5)
WBC: 4.9 10*3/uL (ref 3.9–10.3)
lymph#: 1.5 10*3/uL (ref 0.9–3.3)

## 2016-12-12 LAB — COMPREHENSIVE METABOLIC PANEL
ALT: 27 U/L (ref 0–55)
AST: 26 U/L (ref 5–34)
Albumin: 3.4 g/dL — ABNORMAL LOW (ref 3.5–5.0)
Alkaline Phosphatase: 95 U/L (ref 40–150)
Anion Gap: 8 mEq/L (ref 3–11)
BUN: 17.5 mg/dL (ref 7.0–26.0)
CO2: 25 mEq/L (ref 22–29)
Calcium: 9.7 mg/dL (ref 8.4–10.4)
Chloride: 109 mEq/L (ref 98–109)
Creatinine: 1.1 mg/dL (ref 0.6–1.1)
EGFR: 59 mL/min/{1.73_m2} — ABNORMAL LOW (ref >90)
Glucose: 84 mg/dl (ref 70–140)
Potassium: 3.6 mEq/L (ref 3.5–5.1)
Sodium: 142 mEq/L (ref 136–145)
Total Bilirubin: 0.22 mg/dL (ref 0.20–1.20)
Total Protein: 7.8 g/dL (ref 6.4–8.3)

## 2016-12-12 LAB — LACTATE DEHYDROGENASE: LDH: 183 U/L (ref 125–245)

## 2016-12-12 MED ORDER — ZOLEDRONIC ACID 4 MG/100ML IV SOLN
4.0000 mg | Freq: Once | INTRAVENOUS | Status: AC
  Administered 2016-12-12: 4 mg via INTRAVENOUS
  Filled 2016-12-12: qty 100

## 2016-12-12 MED ORDER — SODIUM CHLORIDE 0.9 % IV SOLN
Freq: Once | INTRAVENOUS | Status: AC
  Administered 2016-12-12: 12:00:00 via INTRAVENOUS

## 2016-12-12 NOTE — Progress Notes (Signed)
RN visit only for Zometa

## 2016-12-12 NOTE — Patient Instructions (Signed)

## 2016-12-12 NOTE — Progress Notes (Signed)
[name] [phone]   Fax:(336) [place], [place] [address] [place] [address]  DIAGNOSIS: Multiple myeloma, Light chain diagnosed in August 2010  PRIOR THERAPY: 1) status post systemic chemotherapy with Velcade, Doxil and Decadron between 04/24/2009 through 07/27/2009 discontinued secondary to peripheral neuropathy. 2) status post treatment with Revlimid and Decadron between 09/01/2009 through December 2011. 3) status post peripheral blood autologous stem cell transplant on 04/26/2010 at [facility] under the care of Dr. Miki Kins. 4) maintenance chemotherapy with Revlimid 10 mg by mouth daily started June 2012. This will be changed to 5 mg by mouth daily starting next week secondary to persistent diarrhea.  CURRENT THERAPY: 1) Zometa 4 mg IV every 3 months.  INTERVAL HISTORY: Carmen Fisher 76 y.o. female returns to the clinic today for three-month follow-up visit. The patient is feeling fine today with no specific complaints. She denied having any chest pain, shortness breath, cough or hemoptysis. She denied having any fever or chills. She has no nausea, vomiting but she continues to have few episodes of diarrhea even after stopping treatment with Revlimid. She has been on observation for the last few months and the patient is here today for evaluation with repeat myeloma panel.   MEDICAL HISTORY: Past Medical History:  Diagnosis Date  . Anemia, unspecified   . Chronic anticoagulation   . Diarrhea 03/14/2016  . Dyslipidemia   . GERD (gastroesophageal reflux disease)   . Glaucoma   . Glaucoma   . Gout   . Hypertension   . Hypokalemia   . Multiple myeloma ([place])   . Multiple myeloma, without mention of having achieved remission   . Osteoporosis   . Peripheral neuropathy     ALLERGIES:  is allergic to aspirin; ampicillin; aspirin; penicillins; sulfa antibiotics; and sulfa  antibiotics.  MEDICATIONS:  Current Outpatient Prescriptions  Medication Sig Dispense Refill  . acetaminophen (TYLENOL) 500 MG tablet Take 500 mg by mouth every 6 (six) hours as needed.      Marland Kitchen allopurinol (ZYLOPRIM) 300 MG tablet TAKE 1 TABLET EVERY DAY 90 tablet 0  . cholecalciferol (VITAMIN D) 1000 UNITS tablet Take 1,000 Units by mouth daily.    . Cranberry 250 MG TABS Take 250 mg by mouth 2 (two) times daily.    . Diphenhyd-Hydrocort-Nystatin (FIRST-DUKES MOUTHWASH) SUSP Swish and spit one teaspoon by mouth 4 times daily 240 mL 0  . diphenoxylate-atropine (LOMOTIL) 2.5-0.025 MG tablet Take 1 tablet by mouth 4 (four) times daily as needed for diarrhea or loose stools. 30 tablet 0  . gabapentin (NEURONTIN) 600 MG tablet TAKE 1 TABLET TWICE DAILY 180 tablet 0  . latanoprost (XALATAN) 0.005 % ophthalmic solution 1 drop at bedtime.      Marland Kitchen loperamide (IMODIUM) 2 MG capsule TAKE 1 CAPSULE AS NEEDED  FOR  DIARRHEA  OR  LOOSE  STOOL.  TAKE UP TO 6 CAPSULES DAILY 30 capsule 1  . LORazepam (ATIVAN) 1 MG tablet Take 1 mg by mouth every 4 (four) hours as needed. Reported on 09/14/2015    . Multiple Vitamin (MULTIVITAMIN WITH MINERALS) TABS tablet Take 1 tablet by mouth daily.    . pantoprazole (PROTONIX) 40 MG tablet TAKE 1 TABLET EVERY DAY 90 tablet 0  . Potassium Chloride ER 20 MEQ TBCR TAKE 1 AND 1/2 TABLETS DAILY 135 tablet 0  . prochlorperazine (COMPAZINE) 10 MG tablet Take 10 mg by mouth every 6 (six) hours  as needed. Reported on 09/14/2015    . timolol (BETIMOL) 0.5 % ophthalmic solution 1 drop 2 (two) times daily.      Marland Kitchen zolendronic acid (ZOMETA) 4 MG/5ML injection Inject 4 mg into the vein every 3 (three) months.    . dorzolamide-timolol (COSOPT) 22.3-6.8 MG/ML ophthalmic solution     . warfarin (COUMADIN) 2 MG tablet TAKE 1 TABLET EVERY DAY (Patient not taking: Reported on 10/28/2016) 90 tablet 1   No current facility-administered medications for this visit.     SURGICAL HISTORY:  Past  Surgical History:  Procedure Laterality Date  . ABDOMINAL HYSTERECTOMY  1994   w/BSO  . APPENDECTOMY      REVIEW OF SYSTEMS:  A comprehensive review of systems was negative except for: Constitutional: positive for fatigue Gastrointestinal: positive for diarrhea   PHYSICAL EXAMINATION: General appearance: alert, cooperative, fatigued and no distress Head: Normocephalic, without obvious abnormality, atraumatic Neck: no adenopathy, no JVD, supple, symmetrical, trachea midline and thyroid not enlarged, symmetric, no tenderness/mass/nodules Lymph nodes: Cervical, supraclavicular, and axillary nodes normal. Resp: clear to auscultation bilaterally Back: symmetric, no curvature. ROM normal. No CVA tenderness. Cardio: regular rate and rhythm, S1, S2 normal, no murmur, click, rub or gallop GI: soft, non-tender; bowel sounds normal; no masses,  no organomegaly Extremities: extremities normal, atraumatic, no cyanosis or edema  ECOG PERFORMANCE STATUS: 1 - Symptomatic but completely ambulatory  Blood pressure 128/66, pulse 72, temperature 98.2 F (36.8 C), temperature source Oral, resp. rate 19, height 5' 6"  (1.676 m), weight 157 lb 1.6 oz (71.3 kg), SpO2 100 %.  LABORATORY DATA: Lab Results  Component Value Date   WBC 4.9 12/12/2016   HGB 10.5 (L) 12/12/2016   HCT 32.3 (L) 12/12/2016   MCV 100.8 12/12/2016   PLT 312 12/12/2016      Chemistry      Component Value Date/Time   NA 142 09/12/2016 1038   K 3.8 09/12/2016 1038   CL 109 12/08/2012 0640   CL 106 07/28/2012 0924   CO2 26 09/12/2016 1038   BUN 10.7 09/12/2016 1038   CREATININE 1.0 09/12/2016 1038      Component Value Date/Time   CALCIUM 9.5 09/12/2016 1038   ALKPHOS 71 09/12/2016 1038   AST 20 09/12/2016 1038   ALT 20 09/12/2016 1038   BILITOT 0.64 09/12/2016 1038       RADIOGRAPHIC STUDIES: US Abdomen Complete  Result Date: 12/04/2016 CLINICAL DATA:  Elevated alkaline phosphatase level. History of multiple myeloma  EXAM: ABDOMEN ULTRASOUND COMPLETE COMPARISON:  None in PACs FINDINGS: Gallbladder: The gallbladder is adequately distended. There is a 3 cm diameter mobile, hyperechoic, irregularly margined shadowing structurenear the gallbladder neck. There is no gallbladder wall thickening or pericholecystic fluid. There is no positive sonographic Murphy's sign. Common bile duct: Diameter: 8.7 mm. No abnormal intraluminal echoes are observed. Liver: The hepatic echotexture is subjectively mildly increased. There is a subcapsular cystic appearing structure in the right lobe anteriorly measuring 2.3 x 1.8 x 2.2 cm. There is no intrahepatic ductal dilation. No solid-appearing masses are observed. The surface contour of the liver is normal. Portal vein is patent on color Doppler imaging with normal direction of blood flow towards the liver. IVC: No abnormality visualized. Pancreas: Visualized portion unremarkable. Spleen: Normal in size and echotexture. Right Kidney: Length: 10.7 cm. The renal cortical echotexture is lower than that of the adjacent liver. There is a 1 cm hyperechoic shadowing focus in the midpole with mild fullness of the renal pelvis. Left Kidney:  Length: 10.7 cm. Echogenicity within normal limits. No mass or hydronephrosis visualized. Abdominal aorta: No aneurysm visualized. Other findings: There is no ascites. IMPRESSION: Echogenic mobile hyperechoic partially shadowing structure measuring up to 3 cm in diameter is likely a stone. Tumefactive sludge may be present. No sonographic evidence of acute cholecystitis. The possibility of primary gallbladder malignancy or involvement by multiple myeloma is raised but felt less likely. Dilated common bile duct 8.7 mm without evidence of intraluminal stones or sludge. Simple appearing cyst in the anterior aspect of the right lobe of the liver. 1 cm stone in the midpole or renal pelvis of the right kidney with mild pelvic dilation. Electronically Signed   By: David  Martinique  [name]   On: 12/04/2016 10:47    ASSESSMENT AND PLAN:  This is a very pleasant 76 years old African-American female with multiple myeloma and has been on maintenance Revlimid currently 5 mg by mouth daily and has been on this treatment for almost 6 years now. Her treatment was discontinued secondary to neutropenia and persistent diarrhea. She is currently on observation and feeling fine except for few episodes of diarrhea. The patient had myeloma panel performed earlier today but the results are still pending. I recommended for her to continue on observation and repeat myeloma panel in 3 months if there is no evidence of disease progression on the pending blood work. For the peripheral neuropathy she would continue her current treatment with gabapentin. For the bone disease, the patient will continue her treatment with Zometa. The patient was advised to call immediately if she has any concerning symptoms in the interval. The patient voices understanding of current disease status and treatment options and is in agreement with the current care plan.  All questions were answered. The patient knows to call the clinic with any problems, questions or concerns. We can certainly see the patient much sooner if necessary.  Disclaimer: This note was dictated with voice recognition software. Similar sounding words can inadvertently be transcribed and may not be corrected upon review.

## 2016-12-12 NOTE — Telephone Encounter (Signed)
Gave patient avs and calender for upcoming appointment in November and December.  per 9/13 los

## 2016-12-13 LAB — IGG, IGA, IGM
IgA, Qn, Serum: 363 mg/dL (ref 64–422)
IgG, Qn, Serum: 1412 mg/dL (ref 700–1600)
IgM, Qn, Serum: 22 mg/dL — ABNORMAL LOW (ref 26–217)

## 2016-12-13 LAB — BETA 2 MICROGLOBULIN, SERUM: Beta-2: 1.9 mg/L (ref 0.6–2.4)

## 2016-12-13 LAB — KAPPA/LAMBDA LIGHT CHAINS
Ig Kappa Free Light Chain: 40.6 mg/L — ABNORMAL HIGH (ref 3.3–19.4)
Ig Lambda Free Light Chain: 19.8 mg/L (ref 5.7–26.3)
Kappa/Lambda FluidC Ratio: 2.05 — ABNORMAL HIGH (ref 0.26–1.65)

## 2016-12-18 ENCOUNTER — Other Ambulatory Visit: Payer: Self-pay | Admitting: Internal Medicine

## 2016-12-18 DIAGNOSIS — R1310 Dysphagia, unspecified: Secondary | ICD-10-CM

## 2016-12-18 DIAGNOSIS — R5383 Other fatigue: Secondary | ICD-10-CM | POA: Diagnosis not present

## 2016-12-18 DIAGNOSIS — R030 Elevated blood-pressure reading, without diagnosis of hypertension: Secondary | ICD-10-CM | POA: Diagnosis not present

## 2016-12-18 DIAGNOSIS — Z23 Encounter for immunization: Secondary | ICD-10-CM | POA: Diagnosis not present

## 2016-12-18 DIAGNOSIS — R748 Abnormal levels of other serum enzymes: Secondary | ICD-10-CM | POA: Diagnosis not present

## 2016-12-24 ENCOUNTER — Ambulatory Visit
Admission: RE | Admit: 2016-12-24 | Discharge: 2016-12-24 | Disposition: A | Discharge status: Home / Self Care | Payer: Medicare HMO | Source: Ambulatory Visit | Attending: Internal Medicine | Admitting: Internal Medicine

## 2016-12-24 ENCOUNTER — Other Ambulatory Visit: Payer: Self-pay | Admitting: Internal Medicine

## 2016-12-24 DIAGNOSIS — R1310 Dysphagia, unspecified: Secondary | ICD-10-CM

## 2016-12-24 DIAGNOSIS — R197 Diarrhea, unspecified: Secondary | ICD-10-CM

## 2016-12-26 DIAGNOSIS — H34831 Tributary (branch) retinal vein occlusion, right eye, with macular edema: Secondary | ICD-10-CM | POA: Diagnosis not present

## 2017-01-02 ENCOUNTER — Ambulatory Visit: Payer: Medicare HMO

## 2017-01-02 ENCOUNTER — Other Ambulatory Visit: Payer: Medicare HMO

## 2017-01-06 DIAGNOSIS — R945 Abnormal results of liver function studies: Secondary | ICD-10-CM | POA: Diagnosis not present

## 2017-01-06 DIAGNOSIS — D649 Anemia, unspecified: Secondary | ICD-10-CM | POA: Diagnosis not present

## 2017-01-10 DIAGNOSIS — R945 Abnormal results of liver function studies: Secondary | ICD-10-CM | POA: Diagnosis not present

## 2017-01-16 DIAGNOSIS — Z01818 Encounter for other preprocedural examination: Secondary | ICD-10-CM | POA: Diagnosis not present

## 2017-01-16 DIAGNOSIS — A048 Other specified bacterial intestinal infections: Secondary | ICD-10-CM | POA: Diagnosis not present

## 2017-01-16 DIAGNOSIS — Z1211 Encounter for screening for malignant neoplasm of colon: Secondary | ICD-10-CM | POA: Diagnosis not present

## 2017-01-16 DIAGNOSIS — D649 Anemia, unspecified: Secondary | ICD-10-CM | POA: Diagnosis not present

## 2017-01-16 DIAGNOSIS — D509 Iron deficiency anemia, unspecified: Secondary | ICD-10-CM | POA: Diagnosis not present

## 2017-01-22 DIAGNOSIS — K219 Gastro-esophageal reflux disease without esophagitis: Secondary | ICD-10-CM | POA: Diagnosis not present

## 2017-01-23 ENCOUNTER — Other Ambulatory Visit: Payer: Medicare HMO

## 2017-01-23 ENCOUNTER — Ambulatory Visit: Payer: Medicare HMO

## 2017-01-23 DIAGNOSIS — K219 Gastro-esophageal reflux disease without esophagitis: Secondary | ICD-10-CM | POA: Diagnosis not present

## 2017-01-28 DIAGNOSIS — R748 Abnormal levels of other serum enzymes: Secondary | ICD-10-CM | POA: Diagnosis not present

## 2017-01-28 DIAGNOSIS — K8020 Calculus of gallbladder without cholecystitis without obstruction: Secondary | ICD-10-CM | POA: Diagnosis not present

## 2017-01-28 DIAGNOSIS — K838 Other specified diseases of biliary tract: Secondary | ICD-10-CM | POA: Diagnosis not present

## 2017-01-28 DIAGNOSIS — R030 Elevated blood-pressure reading, without diagnosis of hypertension: Secondary | ICD-10-CM | POA: Diagnosis not present

## 2017-01-29 DIAGNOSIS — R945 Abnormal results of liver function studies: Secondary | ICD-10-CM | POA: Diagnosis not present

## 2017-01-29 DIAGNOSIS — K2270 Barrett's esophagus without dysplasia: Secondary | ICD-10-CM | POA: Diagnosis not present

## 2017-01-29 DIAGNOSIS — K8020 Calculus of gallbladder without cholecystitis without obstruction: Secondary | ICD-10-CM | POA: Diagnosis not present

## 2017-01-29 DIAGNOSIS — D539 Nutritional anemia, unspecified: Secondary | ICD-10-CM | POA: Diagnosis not present

## 2017-02-06 DIAGNOSIS — H34831 Tributary (branch) retinal vein occlusion, right eye, with macular edema: Secondary | ICD-10-CM | POA: Diagnosis not present

## 2017-02-13 ENCOUNTER — Other Ambulatory Visit: Payer: Medicare HMO

## 2017-02-13 ENCOUNTER — Ambulatory Visit: Payer: Medicare HMO

## 2017-02-24 ENCOUNTER — Other Ambulatory Visit: Payer: Self-pay | Admitting: Internal Medicine

## 2017-02-24 DIAGNOSIS — Z139 Encounter for screening, unspecified: Secondary | ICD-10-CM

## 2017-02-26 ENCOUNTER — Other Ambulatory Visit: Payer: Self-pay | Admitting: Internal Medicine

## 2017-03-01 ENCOUNTER — Other Ambulatory Visit: Payer: Self-pay | Admitting: Nurse Practitioner

## 2017-03-04 ENCOUNTER — Telehealth: Payer: Self-pay

## 2017-03-04 DIAGNOSIS — R197 Diarrhea, unspecified: Secondary | ICD-10-CM

## 2017-03-04 MED ORDER — GABAPENTIN 600 MG PO TABS: 600.0000 mg | ORAL_TABLET | Freq: Two times a day (BID) | ORAL | 0 refills | Status: DC

## 2017-03-04 MED ORDER — LOPERAMIDE HCL 2 MG PO CAPS: ORAL_CAPSULE | ORAL | 1 refills | Status: DC

## 2017-03-04 MED ORDER — PANTOPRAZOLE SODIUM 40 MG PO TBEC: 40.0000 mg | DELAYED_RELEASE_TABLET | Freq: Every day | ORAL | 0 refills | Status: DC

## 2017-03-04 NOTE — Telephone Encounter (Signed)
Pt called for refills on pantoprazole, gabapentin and loperamide to [facility] mail order pharmacy. Done.

## 2017-03-05 DIAGNOSIS — K2270 Barrett's esophagus without dysplasia: Secondary | ICD-10-CM | POA: Diagnosis not present

## 2017-03-05 DIAGNOSIS — R945 Abnormal results of liver function studies: Secondary | ICD-10-CM | POA: Diagnosis not present

## 2017-03-05 DIAGNOSIS — K8020 Calculus of gallbladder without cholecystitis without obstruction: Secondary | ICD-10-CM | POA: Diagnosis not present

## 2017-03-06 ENCOUNTER — Other Ambulatory Visit (HOSPITAL_BASED_OUTPATIENT_CLINIC_OR_DEPARTMENT_OTHER): Payer: Medicare HMO

## 2017-03-06 DIAGNOSIS — C9000 Multiple myeloma not having achieved remission: Secondary | ICD-10-CM

## 2017-03-06 LAB — CBC WITH DIFFERENTIAL/PLATELET
BASO%: 0.2 % (ref 0.0–2.0)
Basophils Absolute: 0 10*3/uL (ref 0.0–0.1)
EOS%: 0.7 % (ref 0.0–7.0)
Eosinophils Absolute: 0 10*3/uL (ref 0.0–0.5)
HCT: 36.1 % (ref 34.8–46.6)
HGB: 11.4 g/dL — ABNORMAL LOW (ref 11.6–15.9)
LYMPH%: 41.5 % (ref 14.0–49.7)
MCH: 32.1 pg (ref 25.1–34.0)
MCHC: 31.6 g/dL (ref 31.5–36.0)
MCV: 101.7 fL — ABNORMAL HIGH (ref 79.5–101.0)
MONO#: 0.4 10*3/uL (ref 0.1–0.9)
MONO%: 7.6 % (ref 0.0–14.0)
NEUT#: 2.8 10*3/uL (ref 1.5–6.5)
NEUT%: 50 % (ref 38.4–76.8)
Platelets: 199 10*3/uL (ref 145–400)
RBC: 3.55 10*6/uL — ABNORMAL LOW (ref 3.70–5.45)
RDW: 14.1 % (ref 11.2–14.5)
WBC: 5.5 10*3/uL (ref 3.9–10.3)
lymph#: 2.3 10*3/uL (ref 0.9–3.3)

## 2017-03-06 LAB — COMPREHENSIVE METABOLIC PANEL
ALT: 15 U/L (ref 0–55)
AST: 16 U/L (ref 5–34)
Albumin: 4.1 g/dL (ref 3.5–5.0)
Alkaline Phosphatase: 74 U/L (ref 40–150)
Anion Gap: 9 mEq/L (ref 3–11)
BUN: 14.3 mg/dL (ref 7.0–26.0)
CO2: 25 mEq/L (ref 22–29)
Calcium: 9.8 mg/dL (ref 8.4–10.4)
Chloride: 107 mEq/L (ref 98–109)
Creatinine: 1 mg/dL (ref 0.6–1.1)
EGFR: >60 mL/min/{1.73_m2} (ref >60)
Glucose: 125 mg/dl (ref 70–140)
Potassium: 3.9 mEq/L (ref 3.5–5.1)
Sodium: 141 mEq/L (ref 136–145)
Total Bilirubin: 0.57 mg/dL (ref 0.20–1.20)
Total Protein: 7.8 g/dL (ref 6.4–8.3)

## 2017-03-06 LAB — LACTATE DEHYDROGENASE: LDH: 183 U/L (ref 125–245)

## 2017-03-07 LAB — KAPPA/LAMBDA LIGHT CHAINS
Ig Kappa Free Light Chain: 36.1 mg/L — ABNORMAL HIGH (ref 3.3–19.4)
Ig Lambda Free Light Chain: 14.4 mg/L (ref 5.7–26.3)
Kappa/Lambda FluidC Ratio: 2.51 — ABNORMAL HIGH (ref 0.26–1.65)

## 2017-03-07 LAB — IGG, IGA, IGM
IgA, Qn, Serum: 367 mg/dL (ref 64–422)
IgG, Qn, Serum: 1400 mg/dL (ref 700–1600)
IgM, Qn, Serum: 23 mg/dL — ABNORMAL LOW (ref 26–217)

## 2017-03-07 LAB — BETA 2 MICROGLOBULIN, SERUM: Beta-2: 1.6 mg/L (ref 0.6–2.4)

## 2017-03-12 ENCOUNTER — Ambulatory Visit: Payer: Medicare HMO | Admitting: Internal Medicine

## 2017-03-12 ENCOUNTER — Other Ambulatory Visit: Payer: Medicare HMO

## 2017-03-13 ENCOUNTER — Encounter: Payer: Self-pay | Admitting: Internal Medicine

## 2017-03-13 ENCOUNTER — Ambulatory Visit (HOSPITAL_BASED_OUTPATIENT_CLINIC_OR_DEPARTMENT_OTHER): Payer: Medicare HMO

## 2017-03-13 ENCOUNTER — Telehealth: Payer: Self-pay | Admitting: Internal Medicine

## 2017-03-13 ENCOUNTER — Ambulatory Visit (HOSPITAL_BASED_OUTPATIENT_CLINIC_OR_DEPARTMENT_OTHER): Payer: Medicare HMO | Admitting: Internal Medicine

## 2017-03-13 ENCOUNTER — Other Ambulatory Visit: Payer: Commercial Managed Care - HMO

## 2017-03-13 VITALS — BP 128/66 | HR 69 | Temp 98.9°F | Resp 20 | Wt 158.7 lb

## 2017-03-13 DIAGNOSIS — M899 Disorder of bone, unspecified: Secondary | ICD-10-CM | POA: Diagnosis not present

## 2017-03-13 DIAGNOSIS — C9000 Multiple myeloma not having achieved remission: Secondary | ICD-10-CM

## 2017-03-13 MED ORDER — ZOLEDRONIC ACID 4 MG/100ML IV SOLN
4.0000 mg | Freq: Once | INTRAVENOUS | Status: AC
  Administered 2017-03-13: 4 mg via INTRAVENOUS
  Filled 2017-03-13: qty 100

## 2017-03-13 NOTE — Progress Notes (Signed)
[facility] [phone]   Fax:([phone]) [place], MD [address] [place] [address]  DIAGNOSIS: Multiple myeloma, [place] diagnosed in August 2010  PRIOR THERAPY: 1) status post systemic chemotherapy with Velcade, Doxil and Decadron between 04/24/2009 through 07/27/2009 discontinued secondary to peripheral neuropathy. 2) status post treatment with Revlimid and Decadron between 09/01/2009 through December 2011. 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 Miki Kins. 4) maintenance chemotherapy with Revlimid 10 mg by mouth daily started June 2012. This will be changed to 5 mg by mouth daily starting next week secondary to persistent diarrhea.  CURRENT THERAPY: 1) Zometa 4 mg IV every 3 months.  INTERVAL HISTORY: Carmen Fisher 76 y.o. female returns to the clinic today for 3 months follow-up visit.  The patient is feeling fine today with no specific complaints.  She denied having any weight loss or night sweats.  She denied having any chest pain, shortness of breath, cough or hemoptysis.  She has no nausea, vomiting, diarrhea or constipation.  The patient had repeat myeloma panel performed last week and she is here for evaluation and discussion of her lab results and treatment options.   MEDICAL HISTORY: Past Medical History:  Diagnosis Date  . Anemia, unspecified   . Chronic anticoagulation   . Diarrhea 03/14/2016  . Dyslipidemia   . GERD (gastroesophageal reflux disease)   . Glaucoma   . Glaucoma   . Gout   . Hypertension   . Hypokalemia   . Multiple myeloma ([place])   . Multiple myeloma, without mention of having achieved remission   . Osteoporosis   . Peripheral neuropathy     ALLERGIES:  is allergic to aspirin; ampicillin; aspirin; penicillins; sulfa antibiotics; and sulfa antibiotics.  MEDICATIONS:  Current  Outpatient Medications  Medication Sig Dispense Refill  . acetaminophen (TYLENOL) 500 MG tablet Take 500 mg by mouth every 6 (six) hours as needed.      Marland Kitchen allopurinol (ZYLOPRIM) 300 MG tablet TAKE 1 TABLET EVERY DAY 90 tablet 0  . cholecalciferol (VITAMIN D) 1000 UNITS tablet Take 1,000 Units by mouth daily.    . Cranberry 250 MG TABS Take 250 mg by mouth 2 (two) times daily.    . dorzolamide-timolol (COSOPT) 22.3-6.8 MG/ML ophthalmic solution     . gabapentin (NEURONTIN) 600 MG tablet Take 1 tablet (600 mg total) by mouth 2 (two) times daily. 180 tablet 0  . KLOR-CON M20 20 MEQ tablet TAKE 1 AND 1/2 TABLETS DAILY 135 tablet 0  . latanoprost (XALATAN) 0.005 % ophthalmic solution 1 drop at bedtime.      Marland Kitchen LORazepam (ATIVAN) 1 MG tablet Take 1 mg by mouth every 4 (four) hours as needed. Reported on 09/14/2015    . Multiple Vitamin (MULTIVITAMIN WITH MINERALS) TABS tablet Take 1 tablet by mouth daily.    . pantoprazole (PROTONIX) 40 MG tablet Take 1 tablet (40 mg total) by mouth daily. 90 tablet 0  . Potassium Chloride ER 20 MEQ TBCR TAKE 1 AND 1/2 TABLETS DAILY 135 tablet 0  . timolol (BETIMOL) 0.5 % ophthalmic solution 1 drop 2 (two) times daily.      Marland Kitchen zolendronic acid (ZOMETA) 4 MG/5ML injection Inject 4 mg into the vein every 3 (three) months.    . Diphenhyd-Hydrocort-Nystatin (FIRST-DUKES MOUTHWASH) SUSP Swish and spit one teaspoon by mouth 4 times daily 240 mL  0  . diphenoxylate-atropine (LOMOTIL) 2.5-0.025 MG tablet Take 1 tablet by mouth 4 (four) times daily as needed for diarrhea or loose stools. (Patient not taking: Reported on 03/13/2017) 30 tablet 0  . loperamide (IMODIUM) 2 MG capsule TAKE 1 CAPSULE AS NEEDED FOR DIARRHEA OR LOOSE STOOL (TAKE UP TO 6 CAPSULES DAILY) (Patient not taking: Reported on 03/13/2017) 30 capsule 1  . prochlorperazine (COMPAZINE) 10 MG tablet Take 10 mg by mouth every 6 (six) hours as needed. Reported on 09/14/2015    . warfarin (COUMADIN) 2 MG tablet TAKE 1  TABLET EVERY DAY (Patient not taking: Reported on 10/28/2016) 90 tablet 1   No current facility-administered medications for this visit.     SURGICAL HISTORY:  Past Surgical History:  Procedure Laterality Date  . ABDOMINAL HYSTERECTOMY  1994   w/BSO  . APPENDECTOMY      REVIEW OF SYSTEMS:  A comprehensive review of systems was negative.   PHYSICAL EXAMINATION: General appearance: alert, cooperative and no distress Head: Normocephalic, without obvious abnormality, atraumatic Neck: no adenopathy, no JVD, supple, symmetrical, trachea midline and thyroid not enlarged, symmetric, no tenderness/mass/nodules Lymph nodes: Cervical, supraclavicular, and axillary nodes normal. Resp: clear to auscultation bilaterally Back: symmetric, no curvature. ROM normal. No CVA tenderness. Cardio: regular rate and rhythm, S1, S2 normal, no murmur, click, rub or gallop GI: soft, non-tender; bowel sounds normal; no masses,  no organomegaly Extremities: extremities normal, atraumatic, no cyanosis or edema  ECOG PERFORMANCE STATUS: 1 - Symptomatic but completely ambulatory  Blood pressure 128/66, pulse 69, temperature 98.9 F (37.2 C), temperature source Oral, resp. rate 20, weight 158 lb 11.2 oz (72 kg), SpO2 100 %.  LABORATORY DATA: Lab Results  Component Value Date   WBC 5.5 03/06/2017   HGB 11.4 (L) 03/06/2017   HCT 36.1 03/06/2017   MCV 101.7 (H) 03/06/2017   PLT 199 03/06/2017      Chemistry      Component Value Date/Time   NA 141 03/06/2017 1024   K 3.9 03/06/2017 1024   CL 109 12/08/2012 0640   CL 106 07/28/2012 0924   CO2 25 03/06/2017 1024   BUN 14.3 03/06/2017 1024   CREATININE 1.0 03/06/2017 1024      Component Value Date/Time   CALCIUM 9.8 03/06/2017 1024   ALKPHOS 74 03/06/2017 1024   AST 16 03/06/2017 1024   ALT 15 03/06/2017 1024   BILITOT 0.57 03/06/2017 1024       RADIOGRAPHIC STUDIES: No results found.  ASSESSMENT AND PLAN:  This is a very pleasant 76 years old  African-American female with multiple myeloma and has been on maintenance Revlimid currently 5 mg by mouth daily and has been on this treatment for almost 6 years now. Her treatment was discontinued secondary to neutropenia and persistent diarrhea. The patient is currently on observation and is feeling much better these days. Her myeloma panel showed no concerning findings for disease progression. I discussed the lab results with the patient and recommended for her to continue on observation with repeat myeloma panel in 3 months. For the bone disease, she would continue her treatment with Zometa every 3 months. The patient was advised to call immediately if she has any concerning symptoms in the interval. The patient voices understanding of current disease status and treatment options and is in agreement with the current care plan.  All questions were answered. The patient knows to call the clinic with any problems, questions or concerns. We can certainly see the patient much  sooner if necessary.  Disclaimer: This note was dictated with voice recognition software. Similar sounding words can inadvertently be transcribed and may not be corrected upon review.

## 2017-03-13 NOTE — Patient Instructions (Signed)
Zoledronic Acid injection (Zometa) What is this medicine? ZOLEDRONIC ACID (ZOE le dron ik AS id) lowers the amount of calcium loss from bone. It is used to treat too much calcium in your blood from cancer. It is also used to prevent complications of cancer that has spread to the bone. This medicine may be used for other purposes; ask your health care provider or pharmacist if you have questions. COMMON BRAND NAME(S): Zometa What should I tell my health care provider before I take this medicine? They need to know if you have any of these conditions: -aspirin-sensitive asthma -cancer, especially if you are receiving medicines used to treat cancer -dental disease or wear dentures -infection -kidney disease -receiving corticosteroids like dexamethasone or prednisone -an unusual or allergic reaction to zoledronic acid, other medicines, foods, dyes, or preservatives -pregnant or trying to get pregnant -breast-feeding How should I use this medicine? This medicine is for infusion into a vein. It is given by a health care professional in a hospital or clinic setting. Talk to your pediatrician regarding the use of this medicine in children. Special care may be needed. Overdosage: If you think you have taken too much of this medicine contact a poison control center or emergency room at once. NOTE: This medicine is only for you. Do not share this medicine with others. What if I miss a dose? It is important not to miss your dose. Call your doctor or health care professional if you are unable to keep an appointment. What may interact with this medicine? -certain antibiotics given by injection -NSAIDs, medicines for pain and inflammation, like ibuprofen or naproxen -some diuretics like bumetanide, furosemide -teriparatide -thalidomide This list may not describe all possible interactions. Give your health care provider a list of all the medicines, herbs, non-prescription drugs, or dietary supplements  you use. Also tell them if you smoke, drink alcohol, or use illegal drugs. Some items may interact with your medicine. What should I watch for while using this medicine? Visit your doctor or health care professional for regular checkups. It may be some time before you see the benefit from this medicine. Do not stop taking your medicine unless your doctor tells you to. Your doctor may order blood tests or other tests to see how you are doing. Women should inform their doctor if they wish to become pregnant or think they might be pregnant. There is a potential for serious side effects to an unborn child. Talk to your health care professional or pharmacist for more information. You should make sure that you get enough calcium and vitamin D while you are taking this medicine. Discuss the foods you eat and the vitamins you take with your health care professional. Some people who take this medicine have severe bone, joint, and/or muscle pain. This medicine may also increase your risk for jaw problems or a broken thigh bone. Tell your doctor right away if you have severe pain in your jaw, bones, joints, or muscles. Tell your doctor if you have any pain that does not go away or that gets worse. Tell your dentist and dental surgeon that you are taking this medicine. You should not have major dental surgery while on this medicine. See your dentist to have a dental exam and fix any dental problems before starting this medicine. Take good care of your teeth while on this medicine. Make sure you see your dentist for regular follow-up appointments. What side effects may I notice from receiving this medicine? Side effects that you   should report to your doctor or health care professional as soon as possible: -allergic reactions like skin rash, itching or hives, swelling of the face, lips, or tongue -anxiety, confusion, or depression -breathing problems -changes in vision -eye pain -feeling faint or lightheaded,  falls -jaw pain, especially after dental work -mouth sores -muscle cramps, stiffness, or weakness -redness, blistering, peeling or loosening of the skin, including inside the mouth -trouble passing urine or change in the amount of urine Side effects that usually do not require medical attention (report to your doctor or health care professional if they continue or are bothersome): -bone, joint, or muscle pain -constipation -diarrhea -fever -hair loss -irritation at site where injected -loss of appetite -nausea, vomiting -stomach upset -trouble sleeping -trouble swallowing -weak or tired This list may not describe all possible side effects. Call your doctor for medical advice about side effects. You may report side effects to FDA at 1-800-FDA-1088. Where should I keep my medicine? This drug is given in a hospital or clinic and will not be stored at home. NOTE: This sheet is a summary. It may not cover all possible information. If you have questions about this medicine, talk to your doctor, pharmacist, or health care provider.  2018 Elsevier/Gold Standard (2013-08-14 14:19:39)  

## 2017-03-13 NOTE — Telephone Encounter (Signed)
Appt scheduled per 12/13 los - patient to get an updated calender in treatment area.

## 2017-03-26 ENCOUNTER — Ambulatory Visit
Admission: RE | Admit: 2017-03-26 | Discharge: 2017-03-26 | Disposition: A | Discharge status: Home / Self Care | Payer: Medicare HMO | Source: Ambulatory Visit | Attending: Internal Medicine | Admitting: Internal Medicine

## 2017-03-26 DIAGNOSIS — Z1231 Encounter for screening mammogram for malignant neoplasm of breast: Secondary | ICD-10-CM | POA: Diagnosis not present

## 2017-03-26 DIAGNOSIS — Z139 Encounter for screening, unspecified: Secondary | ICD-10-CM

## 2017-04-11 DIAGNOSIS — N390 Urinary tract infection, site not specified: Secondary | ICD-10-CM | POA: Diagnosis not present

## 2017-04-11 DIAGNOSIS — I10 Essential (primary) hypertension: Secondary | ICD-10-CM | POA: Diagnosis not present

## 2017-04-11 DIAGNOSIS — Z0000 Encounter for general adult medical examination without abnormal findings: Secondary | ICD-10-CM | POA: Diagnosis not present

## 2017-04-11 DIAGNOSIS — E7800 Pure hypercholesterolemia, unspecified: Secondary | ICD-10-CM | POA: Diagnosis not present

## 2017-04-11 DIAGNOSIS — E559 Vitamin D deficiency, unspecified: Secondary | ICD-10-CM | POA: Diagnosis not present

## 2017-04-14 DIAGNOSIS — D509 Iron deficiency anemia, unspecified: Secondary | ICD-10-CM | POA: Diagnosis not present

## 2017-04-14 DIAGNOSIS — D649 Anemia, unspecified: Secondary | ICD-10-CM | POA: Diagnosis not present

## 2017-04-15 DIAGNOSIS — E0590 Thyrotoxicosis, unspecified without thyrotoxic crisis or storm: Secondary | ICD-10-CM | POA: Diagnosis not present

## 2017-04-15 DIAGNOSIS — E559 Vitamin D deficiency, unspecified: Secondary | ICD-10-CM | POA: Diagnosis not present

## 2017-04-15 DIAGNOSIS — N200 Calculus of kidney: Secondary | ICD-10-CM | POA: Diagnosis not present

## 2017-04-15 DIAGNOSIS — M109 Gout, unspecified: Secondary | ICD-10-CM | POA: Diagnosis not present

## 2017-04-15 DIAGNOSIS — R7303 Prediabetes: Secondary | ICD-10-CM | POA: Diagnosis not present

## 2017-04-15 DIAGNOSIS — E7800 Pure hypercholesterolemia, unspecified: Secondary | ICD-10-CM | POA: Diagnosis not present

## 2017-04-15 DIAGNOSIS — M1990 Unspecified osteoarthritis, unspecified site: Secondary | ICD-10-CM | POA: Diagnosis not present

## 2017-04-15 DIAGNOSIS — K219 Gastro-esophageal reflux disease without esophagitis: Secondary | ICD-10-CM | POA: Diagnosis not present

## 2017-04-15 DIAGNOSIS — M4120 Other idiopathic scoliosis, site unspecified: Secondary | ICD-10-CM | POA: Diagnosis not present

## 2017-04-15 DIAGNOSIS — R197 Diarrhea, unspecified: Secondary | ICD-10-CM | POA: Diagnosis not present

## 2017-04-24 ENCOUNTER — Telehealth: Payer: Self-pay | Admitting: *Deleted

## 2017-04-24 NOTE — Telephone Encounter (Signed)
Pt called [facility] " I need some of my Rx sent to [facility]" Returned call to pt, unable to reach line busy.

## 2017-04-25 ENCOUNTER — Telehealth: Payer: Self-pay | Admitting: *Deleted

## 2017-04-25 DIAGNOSIS — R197 Diarrhea, unspecified: Secondary | ICD-10-CM

## 2017-04-25 MED ORDER — ALLOPURINOL 300 MG PO TABS: 300.0000 mg | ORAL_TABLET | Freq: Every day | ORAL | 0 refills | Status: DC

## 2017-04-25 MED ORDER — POTASSIUM CHLORIDE ER 20 MEQ PO TBCR: 1.5000 | EXTENDED_RELEASE_TABLET | Freq: Every day | ORAL | 0 refills | Status: DC

## 2017-04-25 MED ORDER — LOPERAMIDE HCL 2 MG PO CAPS: ORAL_CAPSULE | ORAL | 1 refills | Status: DC

## 2017-04-25 NOTE — Telephone Encounter (Signed)
Called pt regarding Rx needing refill. Pt advised she has changed pharmacy and Rx now go to [facility]. Refills sent to [facility] per pt request

## 2017-05-16 DIAGNOSIS — H2513 Age-related nuclear cataract, bilateral: Secondary | ICD-10-CM | POA: Diagnosis not present

## 2017-05-16 DIAGNOSIS — H25013 Cortical age-related cataract, bilateral: Secondary | ICD-10-CM | POA: Diagnosis not present

## 2017-05-16 DIAGNOSIS — H34831 Tributary (branch) retinal vein occlusion, right eye, with macular edema: Secondary | ICD-10-CM | POA: Diagnosis not present

## 2017-05-16 DIAGNOSIS — H40011 Open angle with borderline findings, low risk, right eye: Secondary | ICD-10-CM | POA: Diagnosis not present

## 2017-06-05 ENCOUNTER — Inpatient Hospital Stay: Payer: Medicare HMO | Attending: Internal Medicine

## 2017-06-05 ENCOUNTER — Encounter: Payer: Self-pay | Admitting: *Deleted

## 2017-06-05 DIAGNOSIS — C9000 Multiple myeloma not having achieved remission: Secondary | ICD-10-CM

## 2017-06-05 LAB — CBC WITH DIFFERENTIAL/PLATELET
Basophils Absolute: 0 10*3/uL (ref 0.0–0.1)
Basophils Relative: 0 %
Eosinophils Absolute: 0.1 10*3/uL (ref 0.0–0.5)
Eosinophils Relative: 1 %
HCT: 35.7 % (ref 34.8–46.6)
Hemoglobin: 11.2 g/dL — ABNORMAL LOW (ref 11.6–15.9)
Lymphocytes Relative: 35 %
Lymphs Abs: 1.9 10*3/uL (ref 0.9–3.3)
MCH: 32 pg (ref 25.1–34.0)
MCHC: 31.4 g/dL — ABNORMAL LOW (ref 31.5–36.0)
MCV: 102 fL — ABNORMAL HIGH (ref 79.5–101.0)
Monocytes Absolute: 0.4 10*3/uL (ref 0.1–0.9)
Monocytes Relative: 7 %
Neutro Abs: 3.1 10*3/uL (ref 1.5–6.5)
Neutrophils Relative %: 57 %
Platelets: 225 10*3/uL (ref 145–400)
RBC: 3.5 MIL/uL — ABNORMAL LOW (ref 3.70–5.45)
RDW: 14 % (ref 11.2–14.5)
WBC: 5.4 10*3/uL (ref 3.9–10.3)

## 2017-06-05 LAB — COMPREHENSIVE METABOLIC PANEL
ALT: 14 U/L (ref 0–55)
AST: 16 U/L (ref 5–34)
Albumin: 3.9 g/dL (ref 3.5–5.0)
Alkaline Phosphatase: 58 U/L (ref 40–150)
Anion gap: 7 (ref 3–11)
BUN: 15 mg/dL (ref 7–26)
CO2: 27 mmol/L (ref 22–29)
Calcium: 9.8 mg/dL (ref 8.4–10.4)
Chloride: 109 mmol/L (ref 98–109)
Creatinine, Ser: 0.97 mg/dL (ref 0.60–1.10)
GFR calc Af Amer: >60 mL/min (ref >60)
GFR calc non Af Amer: 56 mL/min — ABNORMAL LOW (ref >60)
Glucose, Bld: 96 mg/dL (ref 70–140)
Potassium: 4 mmol/L (ref 3.5–5.1)
Sodium: 143 mmol/L (ref 136–145)
Total Bilirubin: 0.4 mg/dL (ref 0.2–1.2)
Total Protein: 7.6 g/dL (ref 6.4–8.3)

## 2017-06-05 LAB — LACTATE DEHYDROGENASE: LDH: 206 U/L (ref 125–245)

## 2017-06-06 LAB — IGG, IGA, IGM
IgA: 359 mg/dL (ref 64–422)
IgG (Immunoglobin G), Serum: 1412 mg/dL (ref 700–1600)
IgM (Immunoglobulin M), Srm: 22 mg/dL — ABNORMAL LOW (ref 26–217)

## 2017-06-06 LAB — KAPPA/LAMBDA LIGHT CHAINS
Kappa free light chain: 47 mg/L — ABNORMAL HIGH (ref 3.3–19.4)
Kappa, lambda light chain ratio: 2.78 — ABNORMAL HIGH (ref 0.26–1.65)
Lambda free light chains: 16.9 mg/L (ref 5.7–26.3)

## 2017-06-06 LAB — BETA 2 MICROGLOBULIN, SERUM: Beta-2 Microglobulin: 1.6 mg/L (ref 0.6–2.4)

## 2017-06-09 ENCOUNTER — Encounter: Payer: Self-pay | Admitting: Diagnostic Neuroimaging

## 2017-06-09 ENCOUNTER — Ambulatory Visit: Payer: Medicare HMO | Admitting: Diagnostic Neuroimaging

## 2017-06-09 ENCOUNTER — Encounter (INDEPENDENT_AMBULATORY_CARE_PROVIDER_SITE_OTHER): Payer: Self-pay

## 2017-06-09 VITALS — BP 134/77 | HR 70 | Ht 66.0 in | Wt 163.2 lb

## 2017-06-09 DIAGNOSIS — T451X5A Adverse effect of antineoplastic and immunosuppressive drugs, initial encounter: Secondary | ICD-10-CM

## 2017-06-09 DIAGNOSIS — G5601 Carpal tunnel syndrome, right upper limb: Secondary | ICD-10-CM | POA: Diagnosis not present

## 2017-06-09 DIAGNOSIS — G620 Drug-induced polyneuropathy: Secondary | ICD-10-CM

## 2017-06-09 NOTE — Patient Instructions (Addendum)
  RIGHT HAND NUMBNESS (suspected right carpal tunnel syndrome) - use carpal tunnel wrist splint at bedtime - may increase gabapentin to 600mg  three times a day   NUMBNESS IN BILATERAL HANDS AND FEET (chemotherapy neuropathy) - continue gabapentin (may increase gabapentin to 600mg  three times a day)

## 2017-06-09 NOTE — Progress Notes (Signed)
[facility]  PATIENT: Carmen Fisher DOB: 1940/12/22  REFERRING CLINICIAN: Audie Pinto, MD HISTORY FROM: patient and chart review  REASON FOR VISIT: new consult    HISTORICAL  CHIEF COMPLAINT:  Chief Complaint  Patient presents with  . NP Dr. Deland Pretty  . R finger numbness, L thumb    Had prior to chemo, but since has worsened.     HISTORY OF PRESENT ILLNESS:   77 year old female here for evaluation of right hand numbness.  2011 patient had chemotherapy for multiple myeloma and developed peripheral neuropathy.  She developed numbness and tingling in her toes and bilateral hands.  At some point she was started on gabapentin for painful neuropathy related to chemotherapy.  Over the past 2 years symptoms have continued to get worse.  Particularly in her right hand.  Patient had previous left carpal tunnel surgery in 2003.  Now she has similar symptoms in her right hand.  This affects her right hand, mainly digits 1-4.  This wakes her up at nighttime.  Patient currently on gabapentin 600 mg twice a day with mild relief.   REVIEW OF SYSTEMS: Full 14 system review of systems performed and negative with exception of: Joint pain cramps aching muscles diarrhea easy bruising blurred vision memory loss not enough sleep.  ALLERGIES: Allergies  Allergen Reactions  . Aspirin Anaphylaxis  . Ampicillin Rash  . Aspirin Swelling  . Penicillins     Rash and itching  . Sulfa Antibiotics Hives  . Sulfa Antibiotics Swelling    HOME MEDICATIONS: Outpatient Medications Prior to Visit  Medication Sig Dispense Refill  . acetaminophen (TYLENOL) 500 MG tablet Take 500 mg by mouth every 6 (six) hours as needed.      Marland Kitchen allopurinol (ZYLOPRIM) 300 MG tablet Take 1 tablet (300 mg total) by mouth daily. 90 tablet 0  . cholecalciferol (VITAMIN D) 1000 UNITS tablet Take 1,000 Units by mouth daily.    . Cranberry 250 MG TABS Take 250 mg by mouth 2 (two) times daily.    .  Diphenhyd-Hydrocort-Nystatin (FIRST-DUKES MOUTHWASH) SUSP Swish and spit one teaspoon by mouth 4 times daily 240 mL 0  . diphenoxylate-atropine (LOMOTIL) 2.5-0.025 MG tablet Take 1 tablet by mouth 4 (four) times daily as needed for diarrhea or loose stools. 30 tablet 0  . dorzolamide-timolol (COSOPT) 22.3-6.8 MG/ML ophthalmic solution     . gabapentin (NEURONTIN) 600 MG tablet Take 1 tablet (600 mg total) by mouth 2 (two) times daily. 180 tablet 0  . KLOR-CON M20 20 MEQ tablet TAKE 1 AND 1/2 TABLETS DAILY 135 tablet 0  . latanoprost (XALATAN) 0.005 % ophthalmic solution 1 drop at bedtime.      Marland Kitchen loperamide (IMODIUM) 2 MG capsule TAKE 1 CAPSULE AS NEEDED FOR DIARRHEA OR LOOSE STOOL (TAKE UP TO 6 CAPSULES DAILY) 30 capsule 1  . LORazepam (ATIVAN) 1 MG tablet Take 1 mg by mouth every 4 (four) hours as needed. Reported on 09/14/2015    . Multiple Vitamin (MULTIVITAMIN WITH MINERALS) TABS tablet Take 1 tablet by mouth daily.    . pantoprazole (PROTONIX) 40 MG tablet Take 1 tablet (40 mg total) by mouth daily. 90 tablet 0  . Potassium Chloride ER 20 MEQ TBCR Take 1.5 tablets by mouth daily. 135 tablet 0  . prochlorperazine (COMPAZINE) 10 MG tablet Take 10 mg by mouth every 6 (six) hours as needed. Reported on 09/14/2015    . timolol (BETIMOL) 0.5 % ophthalmic solution 1 drop 2 (two) times  daily.      . warfarin (COUMADIN) 2 MG tablet TAKE 1 TABLET EVERY DAY 90 tablet 1  . zolendronic acid (ZOMETA) 4 MG/5ML injection Inject 4 mg into the vein every 3 (three) months.     No facility-administered medications prior to visit.     PAST MEDICAL HISTORY: Past Medical History:  Diagnosis Date  . Anemia, unspecified   . Cataracts, bilateral   . Chronic anticoagulation   . Diarrhea 03/14/2016  . Dyslipidemia   . GERD (gastroesophageal reflux disease)   . Glaucoma   . Glaucoma   . Gout   . History of bone marrow transplant ([place])   . Hypertension   . Hypokalemia   . Multiple myeloma ([place])   . Multiple  myeloma, without mention of having achieved remission   . Osteoarthritis   . Osteoporosis   . Peripheral neuropathy     PAST SURGICAL HISTORY: Past Surgical History:  Procedure Laterality Date  . ABDOMINAL HYSTERECTOMY  1994   w/BSO  . APPENDECTOMY    . BONE MARROW TRANSPLANT      FAMILY HISTORY: Family History  Problem Relation Age of Onset  . Stroke Mother   . Hypertension Mother   . Cancer Father        throat  . Cancer Sister        breast  . Sickle cell trait Sister   . Hypertension Unknown     SOCIAL HISTORY:  Social History   Socioeconomic History  . Marital status: Widowed    Spouse name: Not on file  . Number of children: 2  . Years of education: Not on file  . Highest education level: Not on file  Social Needs  . Financial resource strain: Not on file  . Food insecurity - worry: Not on file  . Food insecurity - inability: Not on file  . Transportation needs - medical: Not on file  . Transportation needs - non-medical: Not on file  Occupational History    Comment: retired- [employer]  Tobacco Use  . Smoking status: Never Smoker  . Smokeless tobacco: Never Used  Substance and Sexual Activity  . Alcohol use: No    Comment: Occasionally  . Drug use: No  . Sexual activity: Not on file  Other Topics Concern  . Not on file  Social History Narrative   ** Merged History Encounter **   Lives alone.  Children: one in [place], and [place].  Retired.     Little caffeine     PHYSICAL EXAM  GENERAL EXAM/CONSTITUTIONAL: Vitals:  Vitals:   06/09/17 0823  BP: 134/77  Pulse: 70  Weight: 163 lb 3.2 oz (74 kg)  Height: 5' 6"  (1.676 m)     Body mass index is 26.34 kg/m.  Visual Acuity Screening   Right eye Left eye Both eyes  Without correction:     With correction: 20/70 20/30      Patient is in no distress; well developed, nourished and groomed; neck is supple  CARDIOVASCULAR:  Examination of carotid arteries is normal; no carotid  bruits  Regular rate and rhythm, no murmurs  Examination of peripheral vascular system by observation and palpation is normal  EYES:  Ophthalmoscopic exam of optic discs and posterior segments is normal; no papilledema or hemorrhages  MUSCULOSKELETAL:  Gait, strength, tone, movements noted in Neurologic exam below  NEUROLOGIC: MENTAL STATUS:  No flowsheet data found.  awake, alert, oriented to person, place and time  recent and remote memory  intact  normal attention and concentration  language fluent, comprehension intact, naming intact,   fund of knowledge appropriate  CRANIAL NERVE:   2nd - no papilledema on fundoscopic exam  2nd, 3rd, 4th, 6th - pupils equal and reactive to light, visual fields full to confrontation, extraocular muscles intact, no nystagmus  5th - facial sensation symmetric  7th - facial strength symmetric  8th - hearing intact  9th - palate elevates symmetrically, uvula midline  11th - shoulder shrug symmetric  12th - tongue protrusion midline  MASKED FACIES  MOTOR:   normal bulk and tone, full strength in the BUE, BLE; EXCEPT ATROPHY IN RIGHT > LEFT ABDUCTOR POLLICUS BREVIS  SENSORY:   normal and symmetric to light touch, pinprick, temperature, vibration; EXCEPT DECR PP AND VIB IN RIGHT HAND (DIGITS 1-4)  POSITIVE PHALEN'S IN RIGHT HAND  COORDINATION:   finger-nose-finger, fine finger movements normal  REFLEXES:   deep tendon reflexes TRACE and symmetric; ABSENT AT ANKLES  GAIT/STATION:   narrow based gait    DIAGNOSTIC DATA (LABS, IMAGING, TESTING) - I reviewed patient records, labs, notes, testing and imaging myself where available.  Lab Results  Component Value Date   WBC 5.4 06/05/2017   HGB 11.2 (L) 06/05/2017   HCT 35.7 06/05/2017   MCV 102.0 (H) 06/05/2017   PLT 225 06/05/2017      Component Value Date/Time   NA 143 06/05/2017 1049   NA 141 03/06/2017 1024   K 4.0 06/05/2017 1049   K 3.9 03/06/2017  1024   CL 109 06/05/2017 1049   CL 106 07/28/2012 0924   CO2 27 06/05/2017 1049   CO2 25 03/06/2017 1024   GLUCOSE 96 06/05/2017 1049   GLUCOSE 125 03/06/2017 1024   GLUCOSE 89 07/28/2012 0924   BUN 15 06/05/2017 1049   BUN 14.3 03/06/2017 1024   CREATININE 0.97 06/05/2017 1049   CREATININE 1.0 03/06/2017 1024   CALCIUM 9.8 06/05/2017 1049   CALCIUM 9.8 03/06/2017 1024   PROT 7.6 06/05/2017 1049   PROT 7.8 03/06/2017 1024   ALBUMIN 3.9 06/05/2017 1049   ALBUMIN 4.1 03/06/2017 1024   AST 16 06/05/2017 1049   AST 16 03/06/2017 1024   ALT 14 06/05/2017 1049   ALT 15 03/06/2017 1024   ALKPHOS 58 06/05/2017 1049   ALKPHOS 74 03/06/2017 1024   BILITOT 0.4 06/05/2017 1049   BILITOT 0.57 03/06/2017 1024   GFRNONAA 56 (L) 06/05/2017 1049   GFRAA >60 06/05/2017 1049   Lab Results  Component Value Date   CHOL  06/05/2008    155        ATP III CLASSIFICATION:  <200     mg/dL   Desirable  200-239  mg/dL   Borderline High  >=240    mg/dL   High          HDL 38 (L) 06/05/2008   LDLCALC  06/05/2008    92        Total Cholesterol/HDL:CHD Risk Coronary Heart Disease Risk Table                     Men   Women  1/2 Average Risk   3.4   3.3  Average Risk       5.0   4.4  2 X Average Risk   9.6   7.1  3 X Average Risk  23.4   11.0        Use the calculated Patient Ratio above and the CHD  Risk Table to determine the patient's CHD Risk.        ATP III CLASSIFICATION (LDL):  <100     mg/dL   Optimal  100-129  mg/dL   Near or Above                    Optimal  130-159  mg/dL   Borderline  160-189  mg/dL   High  >190     mg/dL   Very High   TRIG 125 06/05/2008   CHOLHDL 4.1 06/05/2008   Lab Results  Component Value Date   HGBA1C 6.3 (H) 12/05/2012   Lab Results  Component Value Date   VITAMINB12 371 02/03/2009   Lab Results  Component Value Date   TSH 0.547 Test methodology is 3rd generation TSH 06/04/2008       ASSESSMENT AND PLAN  77 y.o. 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  Most likely represents superimposed right carpal tunnel syndrome.  Patient does not want to undergo surgery or procedure at this time.  She would like to continue conservative management.  Advised her to use carpal tunnel wrist splint at bedtime, increase gabapentin and monitor symptoms.  If symptoms continue to persist in the next few months, patient will call us back for EMG nerve conduction study and then referral to hand surgery.   Ddx: carpal tunnel syndrome + chemotherapy neuropathy  1. 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    VIPenni BombardMD 06/07/96/72158:8:72M Certified in Neurology, Neurophysiology and Neuroimaging  [facility] [id number] [phone]

## 2017-06-12 ENCOUNTER — Other Ambulatory Visit: Payer: Self-pay | Admitting: Oncology

## 2017-06-12 ENCOUNTER — Other Ambulatory Visit: Payer: Commercial Managed Care - HMO

## 2017-06-12 ENCOUNTER — Encounter: Payer: Self-pay | Admitting: Internal Medicine

## 2017-06-12 ENCOUNTER — Inpatient Hospital Stay: Payer: Medicare HMO

## 2017-06-12 ENCOUNTER — Telehealth: Payer: Self-pay | Admitting: Internal Medicine

## 2017-06-12 ENCOUNTER — Inpatient Hospital Stay (HOSPITAL_BASED_OUTPATIENT_CLINIC_OR_DEPARTMENT_OTHER): Payer: Medicare HMO | Admitting: Internal Medicine

## 2017-06-12 VITALS — BP 133/64 | HR 81 | Temp 98.6°F | Resp 20 | Ht 66.0 in | Wt 164.5 lb

## 2017-06-12 DIAGNOSIS — C9000 Multiple myeloma not having achieved remission: Secondary | ICD-10-CM

## 2017-06-12 DIAGNOSIS — M899 Disorder of bone, unspecified: Secondary | ICD-10-CM

## 2017-06-12 DIAGNOSIS — R197 Diarrhea, unspecified: Secondary | ICD-10-CM

## 2017-06-12 MED ORDER — ZOLEDRONIC ACID 4 MG/100ML IV SOLN
4.0000 mg | Freq: Once | INTRAVENOUS | Status: AC
  Administered 2017-06-12: 4 mg via INTRAVENOUS
  Filled 2017-06-12: qty 100

## 2017-06-12 NOTE — Telephone Encounter (Signed)
Scheduled appt per 3/14 los - Gave patient AVS and calender per los.  

## 2017-06-12 NOTE — Progress Notes (Signed)
Carmen Fisher [phone]   Fax:([phone]) [place], MD [address] [place] [address]  DIAGNOSIS: Multiple myeloma, Light chain diagnosed in August 2010  PRIOR THERAPY: 1) status post systemic chemotherapy with Velcade, Doxil and Decadron between 04/24/2009 through 07/27/2009 discontinued secondary to peripheral neuropathy. 2) status post treatment with Revlimid and Decadron between 09/01/2009 through December 2011. 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 Miki Kins. 4) maintenance chemotherapy with Revlimid 10 mg by mouth daily started June 2012. This will be changed to 5 mg by mouth daily starting next week secondary to persistent diarrhea.  CURRENT THERAPY: 1) Zometa 4 mg IV every 3 months.  INTERVAL HISTORY: Carmen Fisher 77 y.o. female returns to the clinic today for follow-up visit.  The patient is feeling fine today with no specific complaints except for a few episodes of diarrhea.  She denied having any recent weight loss or night sweats.  She has no nausea, vomiting or constipation.  She denied having any chest pain, shortness breath, cough or hemoptysis.  She had repeat myeloma panel performed recently and she is here for evaluation and discussion of her lab results.  MEDICAL HISTORY: Past Medical History:  Diagnosis Date  . Anemia, unspecified   . Cataracts, bilateral   . Chronic anticoagulation   . Diarrhea 03/14/2016  . Dyslipidemia   . GERD (gastroesophageal reflux disease)   . Glaucoma   . Glaucoma   . Gout   . History of bone marrow transplant ([place])   . Hypertension   . Hypokalemia   . Multiple myeloma ([place])   . Multiple myeloma, without mention of having achieved remission   . Osteoarthritis   . Osteoporosis   . Peripheral neuropathy     ALLERGIES:  is allergic to aspirin; ampicillin; aspirin;  penicillins; sulfa antibiotics; and sulfa antibiotics.  MEDICATIONS:  Current Outpatient Medications  Medication Sig Dispense Refill  . acetaminophen (TYLENOL) 500 MG tablet Take 500 mg by mouth every 6 (six) hours as needed.      Marland Kitchen allopurinol (ZYLOPRIM) 300 MG tablet Take 1 tablet (300 mg total) by mouth daily. 90 tablet 0  . cholecalciferol (VITAMIN D) 1000 UNITS tablet Take 1,000 Units by mouth daily.    . Cranberry 250 MG TABS Take 250 mg by mouth 2 (two) times daily.    . Diphenhyd-Hydrocort-Nystatin (FIRST-DUKES MOUTHWASH) SUSP Swish and spit one teaspoon by mouth 4 times daily 240 mL 0  . diphenoxylate-atropine (LOMOTIL) 2.5-0.025 MG tablet Take 1 tablet by mouth 4 (four) times daily as needed for diarrhea or loose stools. 30 tablet 0  . dorzolamide-timolol (COSOPT) 22.3-6.8 MG/ML ophthalmic solution     . gabapentin (NEURONTIN) 600 MG tablet Take 1 tablet (600 mg total) by mouth 2 (two) times daily. 180 tablet 0  . KLOR-CON M20 20 MEQ tablet TAKE 1 AND 1/2 TABLETS DAILY 135 tablet 0  . latanoprost (XALATAN) 0.005 % ophthalmic solution 1 drop at bedtime.      Marland Kitchen loperamide (IMODIUM) 2 MG capsule TAKE 1 CAPSULE AS NEEDED FOR DIARRHEA OR LOOSE STOOL (TAKE UP TO 6 CAPSULES DAILY) 30 capsule 1  . LORazepam (ATIVAN) 1 MG tablet Take 1 mg by mouth every 4 (four) hours as needed. Reported on 09/14/2015    . Multiple Vitamin (MULTIVITAMIN WITH MINERALS) TABS tablet Take 1 tablet by mouth daily.    Marland Kitchen  pantoprazole (PROTONIX) 40 MG tablet Take 1 tablet (40 mg total) by mouth daily. 90 tablet 0  . Potassium Chloride ER 20 MEQ TBCR Take 1.5 tablets by mouth daily. 135 tablet 0  . prochlorperazine (COMPAZINE) 10 MG tablet Take 10 mg by mouth every 6 (six) hours as needed. Reported on 09/14/2015    . timolol (BETIMOL) 0.5 % ophthalmic solution 1 drop 2 (two) times daily.      Marland Kitchen warfarin (COUMADIN) 2 MG tablet TAKE 1 TABLET EVERY DAY 90 tablet 1  . zolendronic acid (ZOMETA) 4 MG/5ML injection Inject 4 mg  into the vein every 3 (three) months.     No current facility-administered medications for this visit.     SURGICAL HISTORY:  Past Surgical History:  Procedure Laterality Date  . ABDOMINAL HYSTERECTOMY  1994   w/BSO  . APPENDECTOMY    . BONE MARROW TRANSPLANT      REVIEW OF SYSTEMS:  A comprehensive review of systems was negative except for: Gastrointestinal: positive for diarrhea   PHYSICAL EXAMINATION: General appearance: alert, cooperative and no distress Head: Normocephalic, without obvious abnormality, atraumatic Neck: no adenopathy, no JVD, supple, symmetrical, trachea midline and thyroid not enlarged, symmetric, no tenderness/mass/nodules Lymph nodes: Cervical, supraclavicular, and axillary nodes normal. Resp: clear to auscultation bilaterally Back: symmetric, no curvature. ROM normal. No CVA tenderness. Cardio: regular rate and rhythm, S1, S2 normal, no murmur, click, rub or gallop GI: soft, non-tender; bowel sounds normal; no masses,  no organomegaly Extremities: extremities normal, atraumatic, no cyanosis or edema  ECOG PERFORMANCE STATUS: 1 - Symptomatic but completely ambulatory  Blood pressure 133/64, pulse 81, temperature 98.6 F (37 C), temperature source Oral, resp. rate 20, height 5' 6"  (1.676 m), weight 164 lb 8 oz (74.6 kg), SpO2 100 %.  LABORATORY DATA: Lab Results  Component Value Date   WBC 5.4 06/05/2017   HGB 11.2 (L) 06/05/2017   HCT 35.7 06/05/2017   MCV 102.0 (H) 06/05/2017   PLT 225 06/05/2017      Chemistry      Component Value Date/Time   NA 143 06/05/2017 1049   NA 141 03/06/2017 1024   K 4.0 06/05/2017 1049   K 3.9 03/06/2017 1024   CL 109 06/05/2017 1049   CL 106 07/28/2012 0924   CO2 27 06/05/2017 1049   CO2 25 03/06/2017 1024   BUN 15 06/05/2017 1049   BUN 14.3 03/06/2017 1024   CREATININE 0.97 06/05/2017 1049   CREATININE 1.0 03/06/2017 1024      Component Value Date/Time   CALCIUM 9.8 06/05/2017 1049   CALCIUM 9.8  03/06/2017 1024   ALKPHOS 58 06/05/2017 1049   ALKPHOS 74 03/06/2017 1024   AST 16 06/05/2017 1049   AST 16 03/06/2017 1024   ALT 14 06/05/2017 1049   ALT 15 03/06/2017 1024   BILITOT 0.4 06/05/2017 1049   BILITOT 0.57 03/06/2017 1024       RADIOGRAPHIC STUDIES: No results found.  ASSESSMENT AND PLAN:  This is a very pleasant 77 years old African-American female with multiple myeloma and has been on maintenance Revlimid currently 5 mg by mouth daily and has been on this treatment for almost 6 years now. Her treatment was discontinued secondary to neutropenia and persistent diarrhea. The patient is current on observation and she has no concerning complaints except for a few episodes of diarrhea every now and then. Her recent myeloma panel showed no concerning findings except for mild increase in the free kappa light chain.  I discussed the lab results with the patient and recommended for her to continue in observation with repeat myeloma panel in 3 months. For the bone disease, the patient will continue Zometa every 3 months as previously scheduled. She was advised to call immediately if she has any concerning symptoms in the interval. The patient voices understanding of current disease status and treatment options and is in agreement with the current care plan.  All questions were answered. The patient knows to call the clinic with any problems, questions or concerns. We can certainly see the patient much sooner if necessary.  Disclaimer: This note was dictated with voice recognition software. Similar sounding words can inadvertently be transcribed and may not be corrected upon review.

## 2017-06-12 NOTE — Patient Instructions (Signed)

## 2017-06-17 DIAGNOSIS — R030 Elevated blood-pressure reading, without diagnosis of hypertension: Secondary | ICD-10-CM | POA: Diagnosis not present

## 2017-06-17 DIAGNOSIS — G629 Polyneuropathy, unspecified: Secondary | ICD-10-CM | POA: Diagnosis not present

## 2017-06-17 DIAGNOSIS — Z85830 Personal history of malignant neoplasm of bone: Secondary | ICD-10-CM | POA: Diagnosis not present

## 2017-06-17 DIAGNOSIS — K219 Gastro-esophageal reflux disease without esophagitis: Secondary | ICD-10-CM | POA: Diagnosis not present

## 2017-06-17 DIAGNOSIS — Z9481 Bone marrow transplant status: Secondary | ICD-10-CM | POA: Diagnosis not present

## 2017-06-17 DIAGNOSIS — J309 Allergic rhinitis, unspecified: Secondary | ICD-10-CM | POA: Diagnosis not present

## 2017-06-17 DIAGNOSIS — Z803 Family history of malignant neoplasm of breast: Secondary | ICD-10-CM | POA: Diagnosis not present

## 2017-06-17 DIAGNOSIS — H547 Unspecified visual loss: Secondary | ICD-10-CM | POA: Diagnosis not present

## 2017-06-17 DIAGNOSIS — R69 Illness, unspecified: Secondary | ICD-10-CM | POA: Diagnosis not present

## 2017-06-17 DIAGNOSIS — M109 Gout, unspecified: Secondary | ICD-10-CM | POA: Diagnosis not present

## 2017-07-21 ENCOUNTER — Other Ambulatory Visit: Payer: Self-pay | Admitting: Oncology

## 2017-07-21 DIAGNOSIS — C9000 Multiple myeloma not having achieved remission: Secondary | ICD-10-CM

## 2017-07-21 DIAGNOSIS — R197 Diarrhea, unspecified: Secondary | ICD-10-CM

## 2017-07-21 MED ORDER — PANTOPRAZOLE SODIUM 40 MG PO TBEC: 40.0000 mg | DELAYED_RELEASE_TABLET | Freq: Every day | ORAL | 0 refills | Status: DC

## 2017-07-21 MED ORDER — GABAPENTIN 600 MG PO TABS: 600.0000 mg | ORAL_TABLET | Freq: Two times a day (BID) | ORAL | 0 refills | Status: DC

## 2017-08-16 ENCOUNTER — Encounter (HOSPITAL_COMMUNITY)
Admission: EM | Disposition: A | Discharge status: Home Health | Payer: Self-pay | Source: Home / Self Care | Attending: Internal Medicine

## 2017-08-16 ENCOUNTER — Encounter (HOSPITAL_COMMUNITY): Payer: Self-pay | Admitting: Emergency Medicine

## 2017-08-16 ENCOUNTER — Inpatient Hospital Stay (HOSPITAL_COMMUNITY): Payer: Medicare HMO | Admitting: Certified Registered Nurse Anesthetist

## 2017-08-16 ENCOUNTER — Emergency Department (HOSPITAL_COMMUNITY): Payer: Medicare HMO

## 2017-08-16 ENCOUNTER — Other Ambulatory Visit: Payer: Self-pay

## 2017-08-16 ENCOUNTER — Inpatient Hospital Stay (HOSPITAL_COMMUNITY): Payer: Medicare HMO

## 2017-08-16 ENCOUNTER — Inpatient Hospital Stay (HOSPITAL_COMMUNITY)
Admission: EM | Admit: 2017-08-16 | Discharge: 2017-08-22 | DRG: 853 | Disposition: A | Discharge status: Home Health | Payer: Medicare HMO | Attending: Internal Medicine | Admitting: Internal Medicine

## 2017-08-16 DIAGNOSIS — M810 Age-related osteoporosis without current pathological fracture: Secondary | ICD-10-CM | POA: Diagnosis present

## 2017-08-16 DIAGNOSIS — I6789 Other cerebrovascular disease: Secondary | ICD-10-CM | POA: Diagnosis not present

## 2017-08-16 DIAGNOSIS — C9000 Multiple myeloma not having achieved remission: Secondary | ICD-10-CM | POA: Diagnosis present

## 2017-08-16 DIAGNOSIS — R531 Weakness: Secondary | ICD-10-CM | POA: Diagnosis not present

## 2017-08-16 DIAGNOSIS — R1111 Vomiting without nausea: Secondary | ICD-10-CM | POA: Diagnosis not present

## 2017-08-16 DIAGNOSIS — Z87448 Personal history of other diseases of urinary system: Secondary | ICD-10-CM

## 2017-08-16 DIAGNOSIS — M1990 Unspecified osteoarthritis, unspecified site: Secondary | ICD-10-CM | POA: Diagnosis present

## 2017-08-16 DIAGNOSIS — H409 Unspecified glaucoma: Secondary | ICD-10-CM | POA: Diagnosis present

## 2017-08-16 DIAGNOSIS — N390 Urinary tract infection, site not specified: Secondary | ICD-10-CM | POA: Diagnosis not present

## 2017-08-16 DIAGNOSIS — R6510 Systemic inflammatory response syndrome (SIRS) of non-infectious origin without acute organ dysfunction: Secondary | ICD-10-CM | POA: Diagnosis present

## 2017-08-16 DIAGNOSIS — K219 Gastro-esophageal reflux disease without esophagitis: Secondary | ICD-10-CM | POA: Diagnosis present

## 2017-08-16 DIAGNOSIS — R6520 Severe sepsis without septic shock: Secondary | ICD-10-CM | POA: Diagnosis not present

## 2017-08-16 DIAGNOSIS — R339 Retention of urine, unspecified: Secondary | ICD-10-CM | POA: Diagnosis not present

## 2017-08-16 DIAGNOSIS — E876 Hypokalemia: Secondary | ICD-10-CM | POA: Diagnosis not present

## 2017-08-16 DIAGNOSIS — Z419 Encounter for procedure for purposes other than remedying health state, unspecified: Secondary | ICD-10-CM

## 2017-08-16 DIAGNOSIS — Z808 Family history of malignant neoplasm of other organs or systems: Secondary | ICD-10-CM

## 2017-08-16 DIAGNOSIS — H269 Unspecified cataract: Secondary | ICD-10-CM | POA: Diagnosis present

## 2017-08-16 DIAGNOSIS — A4181 Sepsis due to Enterococcus: Secondary | ICD-10-CM | POA: Diagnosis present

## 2017-08-16 DIAGNOSIS — R509 Fever, unspecified: Secondary | ICD-10-CM | POA: Diagnosis present

## 2017-08-16 DIAGNOSIS — E872 Acidosis: Secondary | ICD-10-CM | POA: Diagnosis not present

## 2017-08-16 DIAGNOSIS — Z832 Family history of diseases of the blood and blood-forming organs and certain disorders involving the immune mechanism: Secondary | ICD-10-CM

## 2017-08-16 DIAGNOSIS — R4182 Altered mental status, unspecified: Secondary | ICD-10-CM | POA: Diagnosis present

## 2017-08-16 DIAGNOSIS — Z803 Family history of malignant neoplasm of breast: Secondary | ICD-10-CM

## 2017-08-16 DIAGNOSIS — Z9049 Acquired absence of other specified parts of digestive tract: Secondary | ICD-10-CM

## 2017-08-16 DIAGNOSIS — N10 Acute tubulo-interstitial nephritis: Secondary | ICD-10-CM | POA: Diagnosis not present

## 2017-08-16 DIAGNOSIS — I10 Essential (primary) hypertension: Secondary | ICD-10-CM | POA: Diagnosis not present

## 2017-08-16 DIAGNOSIS — N179 Acute kidney failure, unspecified: Secondary | ICD-10-CM | POA: Diagnosis not present

## 2017-08-16 DIAGNOSIS — Z882 Allergy status to sulfonamides status: Secondary | ICD-10-CM

## 2017-08-16 DIAGNOSIS — Z880 Allergy status to penicillin: Secondary | ICD-10-CM

## 2017-08-16 DIAGNOSIS — A419 Sepsis, unspecified organism: Secondary | ICD-10-CM | POA: Diagnosis not present

## 2017-08-16 DIAGNOSIS — Z90710 Acquired absence of both cervix and uterus: Secondary | ICD-10-CM

## 2017-08-16 DIAGNOSIS — N200 Calculus of kidney: Secondary | ICD-10-CM

## 2017-08-16 DIAGNOSIS — Z9481 Bone marrow transplant status: Secondary | ICD-10-CM | POA: Diagnosis not present

## 2017-08-16 DIAGNOSIS — R2689 Other abnormalities of gait and mobility: Secondary | ICD-10-CM | POA: Diagnosis not present

## 2017-08-16 DIAGNOSIS — R29810 Facial weakness: Secondary | ICD-10-CM | POA: Diagnosis not present

## 2017-08-16 DIAGNOSIS — Z87440 Personal history of urinary (tract) infections: Secondary | ICD-10-CM | POA: Diagnosis not present

## 2017-08-16 DIAGNOSIS — N201 Calculus of ureter: Secondary | ICD-10-CM | POA: Diagnosis not present

## 2017-08-16 DIAGNOSIS — Z823 Family history of stroke: Secondary | ICD-10-CM

## 2017-08-16 DIAGNOSIS — G629 Polyneuropathy, unspecified: Secondary | ICD-10-CM | POA: Diagnosis not present

## 2017-08-16 DIAGNOSIS — R000 Tachycardia, unspecified: Secondary | ICD-10-CM | POA: Diagnosis not present

## 2017-08-16 DIAGNOSIS — N132 Hydronephrosis with renal and ureteral calculous obstruction: Secondary | ICD-10-CM | POA: Diagnosis not present

## 2017-08-16 DIAGNOSIS — G9341 Metabolic encephalopathy: Secondary | ICD-10-CM | POA: Diagnosis not present

## 2017-08-16 DIAGNOSIS — A4159 Other Gram-negative sepsis: Secondary | ICD-10-CM | POA: Diagnosis not present

## 2017-08-16 DIAGNOSIS — N1330 Unspecified hydronephrosis: Secondary | ICD-10-CM | POA: Diagnosis not present

## 2017-08-16 DIAGNOSIS — N136 Pyonephrosis: Secondary | ICD-10-CM | POA: Diagnosis not present

## 2017-08-16 DIAGNOSIS — Z886 Allergy status to analgesic agent status: Secondary | ICD-10-CM

## 2017-08-16 DIAGNOSIS — R1084 Generalized abdominal pain: Secondary | ICD-10-CM | POA: Diagnosis not present

## 2017-08-16 DIAGNOSIS — Z79899 Other long term (current) drug therapy: Secondary | ICD-10-CM

## 2017-08-16 DIAGNOSIS — M109 Gout, unspecified: Secondary | ICD-10-CM | POA: Diagnosis present

## 2017-08-16 DIAGNOSIS — G9340 Encephalopathy, unspecified: Secondary | ICD-10-CM | POA: Diagnosis not present

## 2017-08-16 DIAGNOSIS — Z8249 Family history of ischemic heart disease and other diseases of the circulatory system: Secondary | ICD-10-CM

## 2017-08-16 DIAGNOSIS — Z7901 Long term (current) use of anticoagulants: Secondary | ICD-10-CM

## 2017-08-16 DIAGNOSIS — J984 Other disorders of lung: Secondary | ICD-10-CM | POA: Diagnosis not present

## 2017-08-16 DIAGNOSIS — R112 Nausea with vomiting, unspecified: Secondary | ICD-10-CM | POA: Diagnosis not present

## 2017-08-16 DIAGNOSIS — Z466 Encounter for fitting and adjustment of urinary device: Secondary | ICD-10-CM | POA: Diagnosis not present

## 2017-08-16 HISTORY — DX: Personal history of urinary (tract) infections: Z87.440

## 2017-08-16 HISTORY — PX: CYSTOSCOPY W/ URETERAL STENT PLACEMENT: SHX1429

## 2017-08-16 HISTORY — DX: Personal history of other diseases of urinary system: Z87.448

## 2017-08-16 LAB — CBC WITH DIFFERENTIAL/PLATELET
Basophils Absolute: 0 10*3/uL (ref 0.0–0.1)
Basophils Relative: 1 %
Eosinophils Absolute: 0 10*3/uL (ref 0.0–0.7)
Eosinophils Relative: 0 %
HCT: 39.8 % (ref 36.0–46.0)
Hemoglobin: 12.8 g/dL (ref 12.0–15.0)
Lymphocytes Relative: 9 %
Lymphs Abs: 0.3 10*3/uL — ABNORMAL LOW (ref 0.7–4.0)
MCH: 32.3 pg (ref 26.0–34.0)
MCHC: 32.2 g/dL (ref 30.0–36.0)
MCV: 100.5 fL — ABNORMAL HIGH (ref 78.0–100.0)
Monocytes Absolute: 0 10*3/uL — ABNORMAL LOW (ref 0.1–1.0)
Monocytes Relative: 1 %
Neutro Abs: 3.4 10*3/uL (ref 1.7–7.7)
Neutrophils Relative %: 89 %
Platelets: 133 10*3/uL — ABNORMAL LOW (ref 150–400)
RBC: 3.96 MIL/uL (ref 3.87–5.11)
RDW: 13.6 % (ref 11.5–15.5)
WBC Morphology: INCREASED
WBC: 3.7 10*3/uL — ABNORMAL LOW (ref 4.0–10.5)

## 2017-08-16 LAB — URINALYSIS, ROUTINE W REFLEX MICROSCOPIC
Bilirubin Urine: NEGATIVE
Glucose, UA: NEGATIVE mg/dL
Ketones, ur: NEGATIVE mg/dL
Leukocytes, UA: NEGATIVE
Nitrite: NEGATIVE
Protein, ur: NEGATIVE mg/dL
Specific Gravity, Urine: 1.009 (ref 1.005–1.030)
pH: 7 (ref 5.0–8.0)

## 2017-08-16 LAB — COMPREHENSIVE METABOLIC PANEL
ALT: 18 U/L (ref 14–54)
AST: 33 U/L (ref 15–41)
Albumin: 3.2 g/dL — ABNORMAL LOW (ref 3.5–5.0)
Alkaline Phosphatase: 50 U/L (ref 38–126)
Anion gap: 13 (ref 5–15)
BUN: 14 mg/dL (ref 6–20)
CO2: 20 mmol/L — ABNORMAL LOW (ref 22–32)
Calcium: 8 mg/dL — ABNORMAL LOW (ref 8.9–10.3)
Chloride: 108 mmol/L (ref 101–111)
Creatinine, Ser: 1.79 mg/dL — ABNORMAL HIGH (ref 0.44–1.00)
GFR calc Af Amer: 31 mL/min — ABNORMAL LOW (ref >60)
GFR calc non Af Amer: 27 mL/min — ABNORMAL LOW (ref >60)
Glucose, Bld: 111 mg/dL — ABNORMAL HIGH (ref 65–99)
Potassium: 3 mmol/L — ABNORMAL LOW (ref 3.5–5.1)
Sodium: 141 mmol/L (ref 135–145)
Total Bilirubin: 1.9 mg/dL — ABNORMAL HIGH (ref 0.3–1.2)
Total Protein: 6.2 g/dL — ABNORMAL LOW (ref 6.5–8.1)

## 2017-08-16 LAB — I-STAT ARTERIAL BLOOD GAS, ED
Acid-base deficit: 8 mmol/L — ABNORMAL HIGH (ref 0.0–2.0)
Bicarbonate: 15.8 mmol/L — ABNORMAL LOW (ref 20.0–28.0)
O2 Saturation: 94 %
Patient temperature: 98.6
TCO2: 17 mmol/L — ABNORMAL LOW (ref 22–32)
pCO2 arterial: 28 mmHg — ABNORMAL LOW (ref 32.0–48.0)
pH, Arterial: 7.36 (ref 7.350–7.450)
pO2, Arterial: 73 mmHg — ABNORMAL LOW (ref 83.0–108.0)

## 2017-08-16 LAB — LIPASE, BLOOD: Lipase: 29 U/L (ref 11–51)

## 2017-08-16 LAB — PROCALCITONIN: Procalcitonin: 55.33 ng/mL

## 2017-08-16 LAB — I-STAT VENOUS BLOOD GAS, ED
Acid-base deficit: 1 mmol/L (ref 0.0–2.0)
Bicarbonate: 24.4 mmol/L (ref 20.0–28.0)
O2 Saturation: 58 %
TCO2: 26 mmol/L (ref 22–32)
pCO2, Ven: 40.8 mmHg — ABNORMAL LOW (ref 44.0–60.0)
pH, Ven: 7.385 (ref 7.250–7.430)
pO2, Ven: 31 mmHg — CL (ref 32.0–45.0)

## 2017-08-16 LAB — MRSA PCR SCREENING: MRSA by PCR: NEGATIVE

## 2017-08-16 LAB — GLUCOSE, CAPILLARY: Glucose-Capillary: 78 mg/dL (ref 65–99)

## 2017-08-16 LAB — I-STAT CG4 LACTIC ACID, ED
Lactic Acid, Venous: 5.16 mmol/L (ref 0.5–1.9)
Lactic Acid, Venous: 6.34 mmol/L (ref 0.5–1.9)
Lactic Acid, Venous: 5.82 mmol/L (ref 0.5–1.9)

## 2017-08-16 LAB — PROTIME-INR
INR: 1.1
Prothrombin Time: 14.1 seconds (ref 11.4–15.2)

## 2017-08-16 SURGERY — CYSTOSCOPY, WITH RETROGRADE PYELOGRAM AND URETERAL STENT INSERTION
Anesthesia: General | Laterality: Right

## 2017-08-16 MED ORDER — SODIUM CHLORIDE 0.9 % IV BOLUS (SEPSIS)
250.0000 mL | Freq: Once | INTRAVENOUS | Status: AC
  Administered 2017-08-16: 250 mL via INTRAVENOUS

## 2017-08-16 MED ORDER — VANCOMYCIN HCL IN DEXTROSE 1-5 GM/200ML-% IV SOLN: 1000.0000 mg | Freq: Once | INTRAVENOUS | Status: DC

## 2017-08-16 MED ORDER — VANCOMYCIN HCL 10 G IV SOLR
1250.0000 mg | Freq: Once | INTRAVENOUS | Status: AC
  Administered 2017-08-16: 1250 mg via INTRAVENOUS
  Filled 2017-08-16: qty 1250

## 2017-08-16 MED ORDER — IOPAMIDOL (ISOVUE-300) INJECTION 61%
INTRAVENOUS | Status: DC | PRN
  Administered 2017-08-16: 10 mL

## 2017-08-16 MED ORDER — ONDANSETRON HCL 4 MG/2ML IJ SOLN: 4.0000 mg | Freq: Four times a day (QID) | INTRAMUSCULAR | Status: DC | PRN

## 2017-08-16 MED ORDER — FENTANYL CITRATE (PF) 250 MCG/5ML IJ SOLN
INTRAMUSCULAR | Status: AC
  Filled 2017-08-16: qty 5

## 2017-08-16 MED ORDER — IOPAMIDOL (ISOVUE-300) INJECTION 61%
INTRAVENOUS | Status: AC
  Filled 2017-08-16: qty 50

## 2017-08-16 MED ORDER — DEXAMETHASONE SODIUM PHOSPHATE 10 MG/ML IJ SOLN
INTRAMUSCULAR | Status: AC
  Filled 2017-08-16: qty 1

## 2017-08-16 MED ORDER — ACETAMINOPHEN 500 MG PO TABS
1000.0000 mg | ORAL_TABLET | Freq: Once | ORAL | Status: DC
  Filled 2017-08-16: qty 2

## 2017-08-16 MED ORDER — SODIUM CHLORIDE 0.9 % IV BOLUS
500.0000 mL | Freq: Once | INTRAVENOUS | Status: AC
  Administered 2017-08-16: 500 mL via INTRAVENOUS

## 2017-08-16 MED ORDER — EPHEDRINE SULFATE 50 MG/ML IJ SOLN
INTRAMUSCULAR | Status: AC
  Filled 2017-08-16: qty 1

## 2017-08-16 MED ORDER — PIPERACILLIN-TAZOBACTAM 3.375 G IVPB 30 MIN
3.3750 g | Freq: Once | INTRAVENOUS | Status: AC
  Administered 2017-08-16: 3.375 g via INTRAVENOUS
  Filled 2017-08-16: qty 50

## 2017-08-16 MED ORDER — PIPERACILLIN-TAZOBACTAM 3.375 G IVPB
3.3750 g | Freq: Three times a day (TID) | INTRAVENOUS | Status: DC
  Administered 2017-08-16: 3.375 g via INTRAVENOUS
  Filled 2017-08-16 (×2): qty 50

## 2017-08-16 MED ORDER — ACETAMINOPHEN 650 MG RE SUPP
650.0000 mg | Freq: Once | RECTAL | Status: AC
  Administered 2017-08-16: 650 mg via RECTAL
  Filled 2017-08-16: qty 1

## 2017-08-16 MED ORDER — POTASSIUM CHLORIDE CRYS ER 20 MEQ PO TBCR: 40.0000 meq | EXTENDED_RELEASE_TABLET | Freq: Once | ORAL | Status: DC

## 2017-08-16 MED ORDER — FENTANYL CITRATE (PF) 250 MCG/5ML IJ SOLN
INTRAMUSCULAR | Status: DC | PRN
  Administered 2017-08-16: 50 ug via INTRAVENOUS

## 2017-08-16 MED ORDER — POTASSIUM CHLORIDE 10 MEQ/100ML IV SOLN
10.0000 meq | INTRAVENOUS | Status: DC
  Administered 2017-08-16 (×2): 10 meq via INTRAVENOUS
  Filled 2017-08-16 (×2): qty 100

## 2017-08-16 MED ORDER — SODIUM CHLORIDE 0.9 % IV SOLN
250.0000 mL | INTRAVENOUS | Status: DC | PRN
  Administered 2017-08-16 (×2): via INTRAVENOUS

## 2017-08-16 MED ORDER — SODIUM CHLORIDE 0.9 % IV SOLN: 250.0000 mL | INTRAVENOUS | Status: DC | PRN

## 2017-08-16 MED ORDER — EPHEDRINE SULFATE 50 MG/ML IJ SOLN
INTRAMUSCULAR | Status: DC | PRN
  Administered 2017-08-16: 10 mg via INTRAVENOUS

## 2017-08-16 MED ORDER — SODIUM CHLORIDE 0.9 % IR SOLN
Status: DC | PRN
  Administered 2017-08-16: 1000 mL

## 2017-08-16 MED ORDER — SODIUM CHLORIDE 0.9 % IV BOLUS (SEPSIS)
1000.0000 mL | Freq: Once | INTRAVENOUS | Status: AC
  Administered 2017-08-16: 1000 mL via INTRAVENOUS

## 2017-08-16 MED ORDER — SODIUM CHLORIDE 0.9 % IV SOLN
1.0000 g | Freq: Once | INTRAVENOUS | Status: DC
  Filled 2017-08-16: qty 1

## 2017-08-16 MED ORDER — PROPOFOL 10 MG/ML IV BOLUS
INTRAVENOUS | Status: AC
  Filled 2017-08-16: qty 20

## 2017-08-16 MED ORDER — ALBUMIN HUMAN 5 % IV SOLN
INTRAVENOUS | Status: AC
  Filled 2017-08-16: qty 250

## 2017-08-16 MED ORDER — SODIUM CHLORIDE 0.9 % IV SOLN
INTRAVENOUS | Status: DC | PRN
  Administered 2017-08-16: 18:00:00 via INTRAVENOUS

## 2017-08-16 MED ORDER — ONDANSETRON HCL 4 MG/2ML IJ SOLN
INTRAMUSCULAR | Status: DC | PRN
  Administered 2017-08-16: 4 mg via INTRAVENOUS

## 2017-08-16 MED ORDER — ORAL CARE MOUTH RINSE
15.0000 mL | Freq: Two times a day (BID) | OROMUCOSAL | Status: DC
  Administered 2017-08-17 (×2): 15 mL via OROMUCOSAL

## 2017-08-16 MED ORDER — ACETAMINOPHEN 325 MG RE SUPP
325.0000 mg | Freq: Once | RECTAL | Status: AC
  Administered 2017-08-16: 325 mg via RECTAL
  Filled 2017-08-16: qty 1

## 2017-08-16 MED ORDER — ENOXAPARIN SODIUM 30 MG/0.3ML ~~LOC~~ SOLN
30.0000 mg | SUBCUTANEOUS | Status: DC
  Administered 2017-08-17 – 2017-08-18 (×2): 30 mg via SUBCUTANEOUS
  Filled 2017-08-16 (×2): qty 0.3

## 2017-08-16 MED ORDER — SODIUM CHLORIDE 0.9 % IV BOLUS
1000.0000 mL | Freq: Once | INTRAVENOUS | Status: AC
  Administered 2017-08-16: 1000 mL via INTRAVENOUS

## 2017-08-16 MED ORDER — PHENYLEPHRINE HCL 10 MG/ML IJ SOLN
INTRAMUSCULAR | Status: DC | PRN
  Administered 2017-08-16: 80 ug via INTRAVENOUS
  Administered 2017-08-16: 120 ug via INTRAVENOUS

## 2017-08-16 MED ORDER — FENTANYL CITRATE (PF) 100 MCG/2ML IJ SOLN
INTRAMUSCULAR | Status: AC
  Filled 2017-08-16: qty 2

## 2017-08-16 MED ORDER — MIDAZOLAM HCL 2 MG/2ML IJ SOLN
INTRAMUSCULAR | Status: DC | PRN
  Administered 2017-08-16: 1 mg via INTRAVENOUS

## 2017-08-16 MED ORDER — SODIUM CHLORIDE 0.9 % IV SOLN
0.0000 ug/min | INTRAVENOUS | Status: DC
  Administered 2017-08-16: 50 ug/min via INTRAVENOUS

## 2017-08-16 MED ORDER — ONDANSETRON HCL 4 MG/2ML IJ SOLN
INTRAMUSCULAR | Status: AC
  Filled 2017-08-16: qty 2

## 2017-08-16 MED ORDER — FENTANYL CITRATE (PF) 100 MCG/2ML IJ SOLN
25.0000 ug | INTRAMUSCULAR | Status: DC | PRN
  Administered 2017-08-16 (×2): 25 ug via INTRAVENOUS

## 2017-08-16 MED ORDER — ALBUMIN HUMAN 5 % IV SOLN
INTRAVENOUS | Status: DC | PRN
  Administered 2017-08-16: 19:00:00 via INTRAVENOUS

## 2017-08-16 MED ORDER — STERILE WATER FOR IRRIGATION IR SOLN
Status: DC | PRN
  Administered 2017-08-16: 1000 mL

## 2017-08-16 MED ORDER — VANCOMYCIN HCL IN DEXTROSE 750-5 MG/150ML-% IV SOLN: 750.0000 mg | INTRAVENOUS | Status: DC

## 2017-08-16 MED ORDER — POTASSIUM CHLORIDE 10 MEQ/100ML IV SOLN
10.0000 meq | INTRAVENOUS | Status: AC
  Administered 2017-08-16: 10 meq via INTRAVENOUS
  Filled 2017-08-16: qty 100

## 2017-08-16 MED ORDER — MIDAZOLAM HCL 2 MG/2ML IJ SOLN
INTRAMUSCULAR | Status: AC
  Filled 2017-08-16: qty 2

## 2017-08-16 MED ORDER — SUCCINYLCHOLINE CHLORIDE 200 MG/10ML IV SOSY
PREFILLED_SYRINGE | INTRAVENOUS | Status: DC | PRN
  Administered 2017-08-16: 70 mg via INTRAVENOUS

## 2017-08-16 MED ORDER — PROPOFOL 10 MG/ML IV BOLUS
INTRAVENOUS | Status: DC | PRN
  Administered 2017-08-16: 70 mg via INTRAVENOUS

## 2017-08-16 SURGICAL SUPPLY — 37 items
ADAPTER CATH URET PLST 4-6FR (CATHETERS) IMPLANT
ADPR CATH URET STRL DISP 4-6FR (CATHETERS)
APL SKNCLS STERI-STRIP NONHPOA (GAUZE/BANDAGES/DRESSINGS)
BAG URINE DRAINAGE (UROLOGICAL SUPPLIES) ×2 IMPLANT
BAG URO CATCHER STRL LF (MISCELLANEOUS) ×2 IMPLANT
BENZOIN TINCTURE PRP APPL 2/3 (GAUZE/BANDAGES/DRESSINGS) IMPLANT
BLADE 10 SAFETY STRL DISP (BLADE) ×2 IMPLANT
BUCKET BIOHAZARD WASTE 5 GAL (MISCELLANEOUS) IMPLANT
CATH FOLEY 2WAY SLVR  5CC 16FR (CATHETERS) ×1
CATH FOLEY 2WAY SLVR 5CC 16FR (CATHETERS) IMPLANT
CATH INTERMIT  6FR 70CM (CATHETERS) IMPLANT
CATH URET 5FR 28IN CONE TIP (BALLOONS)
CATH URET 5FR 28IN OPEN ENDED (CATHETERS) ×1 IMPLANT
CATH URET 5FR 70CM CONE TIP (BALLOONS) IMPLANT
COVER SURGICAL LIGHT HANDLE (MISCELLANEOUS) ×2 IMPLANT
DRAPE CAMERA CLOSED 9X96 (DRAPES) ×2 IMPLANT
GLOVE BIO SURGEON STRL SZ7 (GLOVE) ×2 IMPLANT
GOWN STRL REUS W/ TWL XL LVL3 (GOWN DISPOSABLE) ×2 IMPLANT
GOWN STRL REUS W/TWL XL LVL3 (GOWN DISPOSABLE) ×6
GUIDEWIRE ANG ZIPWIRE 038X150 (WIRE) ×1 IMPLANT
GUIDEWIRE COOK  .035 (WIRE) IMPLANT
GUIDEWIRE STR DUAL SENSOR (WIRE) ×3 IMPLANT
KIT TURNOVER KIT B (KITS) ×2 IMPLANT
MANIFOLD NEPTUNE II (INSTRUMENTS) IMPLANT
NS IRRIG 1000ML POUR BTL (IV SOLUTION) ×2 IMPLANT
PACK CYSTO (CUSTOM PROCEDURE TRAY) ×2 IMPLANT
PAD ARMBOARD 7.5X6 YLW CONV (MISCELLANEOUS) ×4 IMPLANT
PLUG CATH AND CAP STER (CATHETERS) IMPLANT
STENT POLARIS 5FRX24 (STENTS) IMPLANT
STENT POLARIS 5FRX26 (STENTS) IMPLANT
STENT POLARIS 5FRX28 (STENTS) IMPLANT
STENT URET 6FRX24 CONTOUR (STENTS) ×1 IMPLANT
SYR CONTROL 10ML LL (SYRINGE) ×2 IMPLANT
SYRINGE TOOMEY DISP (SYRINGE) IMPLANT
UNDERPAD 30X30 (UNDERPADS AND DIAPERS) ×2 IMPLANT
WATER STERILE IRR 1000ML POUR (IV SOLUTION) ×2 IMPLANT
WIRE COONS/BENSON .038X145CM (WIRE) ×1 IMPLANT

## 2017-08-16 NOTE — Consult Note (Signed)
PULMONARY / CRITICAL CARE MEDICINE   Name: Carmen Fisher MRN: [id number] DOB: 05-03-1940    ADMISSION DATE:  08/16/2017       CHIEF COMPLAINT:  Nausea ,vomiting,fever and chills  HISTORY OF PRESENT ILLNESS:   Patient developed nausea and vomiting overnight. Has a fever of 103.9 in the ED.  When she firs t presented she was quite obtunded. Has since become more awake and responsive. Patient denies fla nk pain or dysuria.She has aistory of UTIs in the past. She hasd a history of multiple Myeloma but haS NOT HAD ANY THERAPY FOR THE Past 2 months. CT of head was unremarkable. CT of the abdomen revealed a a 80m stone at the right UPJ with mild hydronephrosis which is the likely cause of her symptoms. Urology has been contacted and will be coming in to see the patient and likely take her to the OR.     PAST MEDICAL HISTORY :  She  has a past medical history of Anemia, unspecified, Cataracts, bilateral, Chronic anticoagulation, Diarrhea (03/14/2016), Dyslipidemia, GERD (gastroesophageal reflux disease), Glaucoma, Glaucoma, Gout, [place], Hypertension, Hypokalemia, Multiple myeloma [place], Multiple myeloma, without mention of having achieved remission, Osteoarthritis, Osteoporosis, and Peripheral neuropathy.  PAST SURGICAL HISTORY: She  has a past surgical history that includes Appendectomy; Abdominal hysterectomy (1994); and Bone marrow transplant.  Allergies  Allergen Reactions  . Aspirin Anaphylaxis  . Ampicillin Rash  . Aspirin Swelling  . Sulfa Antibiotics Hives  . Sulfa Antibiotics Swelling  . Penicillins Itching and Rash    Has patient had a PCN reaction causing immediate rash, facial/tongue/throat swelling, SOB or lightheadedness with hypotension: Yes Has patient had a PCN reaction causing severe rash involving mucus membranes or skin necrosis: No Has patient had a PCN reaction that required hospitalization: No Has patient had a PCN reaction  occurring within the last 10 years: No If all of the above answers are "NO", then may proceed with Cephalosporin use.   Rash and itching Has received Zosyn and Augmentin in th    No current facility-administered medications on file prior to encounter.    Current Outpatient Medications on File Prior to Encounter  Medication Sig  . acetaminophen (TYLENOL) 500 MG tablet Take 500 mg by mouth every 6 (six) hours as needed.    .Marland Kitchenallopurinol (ZYLOPRIM) 300 MG tablet TAKE 1 TABLET DAILY  . cholecalciferol (VITAMIN D) 1000 UNITS tablet Take 1,000 Units by mouth daily.  . Cranberry 250 MG TABS Take 250 mg by mouth 2 (two) times daily.  . dorzolamide-timolol (COSOPT) 22.3-6.8 MG/ML ophthalmic solution Place 1 drop into both eyes 2 (two) times daily.   .Marland Kitchengabapentin (NEURONTIN) 600 MG tablet Take 1 tablet (600 mg total) by mouth 2 (two) times daily.  .Marland KitchenKLOR-CON M20 20 MEQ tablet TAKE 1 AND 1/2 TABLETS DAILY  . latanoprost (XALATAN) 0.005 % ophthalmic solution Place 1 drop into both eyes at bedtime.   .Marland Kitchenloperamide (IMODIUM) 2 MG capsule TAKE 1 CAPSULE AS NEEDED   FOR DIARRHEA OR LOOSE STOOL(TAKE UP TO 6 CAPSULES     DAILY)  . LORazepam (ATIVAN) 1 MG tablet Take 1 mg by mouth every 4 (four) hours as needed (neuropathy). Reported on 09/14/2015  . Multiple Vitamin (MULTIVITAMIN WITH MINERALS) TABS tablet Take 1 tablet by mouth daily.  . pantoprazole (PROTONIX) 40 MG tablet Take 1 tablet (40 mg total) by mouth daily.  . prochlorperazine (COMPAZINE) 10 MG tablet Take 10 mg by mouth every 6 (  six) hours as needed. Reported on 09/14/2015  . timolol (BETIMOL) 0.5 % ophthalmic solution Place 1 drop into both eyes 2 (two) times daily.   Marland Kitchen warfarin (COUMADIN) 2 MG tablet TAKE 1 TABLET EVERY DAY  . diphenoxylate-atropine (LOMOTIL) 2.5-0.025 MG tablet Take 1 tablet by mouth 4 (four) times daily as needed for diarrhea or loose stools. (Patient not taking: Reported on 08/16/2017)  . Potassium Chloride ER 20 MEQ TBCR Take  1.5 tablets by mouth daily. (Patient not taking: Reported on 08/16/2017)    FAMILY HISTORY:  Her indicated that her mother is alive. She indicated that her father is deceased. She indicated that one of her two sisters is deceased. She indicated that the status of her unknown relative is unknown.   SOCIAL HISTORY: She  reports that she has never smoked. She has never used smokeless tobacco. She reports that she does not drink alcohol or use drugs.  REVIEW OF SYSTEMS:   Review of Systems  Constitutional: Positive for chills, fever and malaise/fatigue.  HENT: Negative.   Eyes: Negative.   Respiratory: Negative.   Cardiovascular: Negative.   Gastrointestinal: Negative.   Genitourinary: Negative for dysuria, frequency, hematuria and urgency.  Musculoskeletal: Negative.   Skin: Negative.   Neurological: Negative.   Endo/Heme/Allergies: Negative.       VITAL SIGNS: BP 117/73   Pulse (!) 134   Temp (!) 103.4 F (39.7 C) (Rectal)   Resp (!) 24   Wt 160 lb (72.6 kg)   SpO2 97%   BMI 25.82 kg/m     INTAKE / OUTPUT: No intake/output data recorded.  PHYSICAL EXAMINATION: General:  Patient looks younger than stated age.Sjhe appears acutely ill Neuro:  Lehthargic but arouses. Will repond to my questions when coaxed.. Moves all extremities Speech is fluent HEENT:  [place]/AT Cardiovascular:  RRR, s1s2 Lungs:Clear to A and P Abdomen: soft, non-tender, BS+ Musculoskeletal:  WNL    LABS:  BMET Recent Labs  Lab 08/16/17 1415  NA 141  K 3.0*  CL 108  CO2 20*  BUN 14  CREATININE 1.79*  GLUCOSE 111*    Electrolytes Recent Labs  Lab 08/16/17 1415  CALCIUM 8.0*    CBC Recent Labs  Lab 08/16/17 1220  WBC 3.7*  HGB 12.8  HCT 39.8  PLT 133*    Coag's Recent Labs  Lab 08/16/17 1415  INR 1.10    Sepsis Markers Recent Labs  Lab 08/16/17 1229 08/16/17 1426  LATICACIDVEN 5.16* 6.34*    ABG Recent Labs  Lab 08/16/17 1544  PHART 7.360  PCO2ART 28.0*   PO2ART 73.0*    Liver Enzymes Recent Labs  Lab 08/16/17 1415  AST 33  ALT 18  ALKPHOS 50  BILITOT 1.9*  ALBUMIN 3.2*    Cardiac Enzymes No results for input(s): TROPONINI, PROBNP in the last 168 hours.  Glucose No results for input(s): GLUCAP in the last 168 hours.  Imaging Ct Abdomen Pelvis Wo Contrast  Result Date: 08/16/2017 CLINICAL DATA:  Abdominal distention, nausea, vomiting EXAM: CT ABDOMEN AND PELVIS WITHOUT CONTRAST TECHNIQUE: Multidetector CT imaging of the abdomen and pelvis was performed following the standard protocol without IV contrast. COMPARISON:  Ultrasound 12/04/2016 FINDINGS: Lower chest: Bibasilar atelectasis.  Heart is normal size. Hepatobiliary: 2.2 cm low-density lesion in the right hepatic lobe peripherally likely corresponds to a cyst seen on prior ultrasound. Gallbladder grossly unremarkable. No biliary ductal dilatation. Pancreas: No focal abnormality or ductal dilatation. Spleen: No focal abnormality.  Normal size. Adrenals/Urinary Tract: Extensive bilateral  perinephric stranding, right greater than left. Mild right hydronephrosis. 12 mm stone in the region of the right UPJ. Calcifications noted inferiorly could reflect small ureteral stones. The ureter is difficult to visualize and follow due to the extensive perinephric stranding which extends inferiorly around the ureter. No stones or hydronephrosis on the left. Adrenal glands and urinary bladder unremarkable. Small amount of gas in the urinary bladder presumably from catheterization. Stomach/Bowel: Stomach, large and small bowel grossly unremarkable. Vascular/Lymphatic: Aortic atherosclerosis. No enlarged abdominal or pelvic lymph nodes. Reproductive: Prior hysterectomy.  No adnexal masses. Other: No free fluid or free air. Musculoskeletal: No acute bony abnormality. IMPRESSION: Extensive bilateral perinephric stranding, right greater than left. There is mild right hydronephrosis with 12 mm right UPJ stone.  Other calcifications noted inferiorly could be ureteral stones on the right but difficult to follow the right ureter with the extensive perinephric and peri ureteral stranding. Aortic atherosclerosis. Bibasilar atelectasis. Electronically Signed   By: Rolm Baptise [name]   On: 08/16/2017 16:47   Ct Head Wo Contrast  Result Date: 08/16/2017 CLINICAL DATA:  77 year old female with a history of nausea and vomiting EXAM: CT HEAD WITHOUT CONTRAST TECHNIQUE: Contiguous axial images were obtained from the base of the skull through the vertex without intravenous contrast. COMPARISON:  None. FINDINGS: Brain: No acute intracranial hemorrhage. No midline shift or mass effect. Gray-white differentiation maintained. Focal hypodensity lateral to the right caudate. Unremarkable appearance of the ventricular system. Vascular: Unremarkable. Skull: No acute fracture.  No aggressive bone lesion identified. Sinuses/Orbits: Unremarkable appearance of the orbits. Trace mucosal disease of the left frontal sinus. Other: None IMPRESSION: Negative for acute intracranial abnormality. Focal hypodensity lateral to the right caudate may represent dilated perivascular space or prior lacunar infarction. Electronically Signed   By: Corrie Mckusick D.O.   On: 08/16/2017 14:25   Dg Chest Port 1 View  Result Date: 08/16/2017 CLINICAL DATA:  77 year old female with a history of shortness of breath and fever EXAM: PORTABLE CHEST 1 VIEW COMPARISON:  03/21/2010, 07/31/2009 FINDINGS: Cardiomediastinal silhouette accentuated by low lung volumes. Linear opacities at the lung bases. Right rotation somewhat limits evaluation. Accentuated mediastinal borders. No pneumothorax or pleural effusion. No large confluent airspace disease. No displaced fracture IMPRESSION: The accentuated heart borders are favored to represent low lung volumes and right rotation, however, mediastinal adenopathy is not excluded on this plain film. A formal PA and lateral chest x-ray  may be useful. Likely atelectasis/consolidation at the lung bases. Electronically Signed   By: Corrie Mckusick D.O.   On: 08/16/2017 13:28       ASSESSMENT / PLAN: Infx The patient presented with a SIRS type picture. Her lactate was elevated.She was encephalopathic.She had fever and chills at home. UA was fairly unremarkable. The patient did not complain OF FLANK PAIN AND NO MENTION OF hematuria. Her Ct scan showed a right 12 mm UPJ stone with mild hypdronephrosis. The patient has been cultured and started on empiric abx therapay with Zosynand Vanco. Urology has been consulted by the ED physican     CARDIOVASCULAR The patient has been hypertensive. Her ECG showed a sinus tach and St depression likely related to her heart rate.     Renal The patient has a right kidney stone with mild hydronephrosis. She has a mild AKI. Her creatinine was o.97 on 3/7 and is now 1.79. The patient has a mild Ag ACIDOSIS WITH GAP OF 13.   Hypokalemia The patient has a potassium of 3.0 She is scheduled to  get 3 runs of potassium of 10 mEq.      FAMILY  Her family is awre of the diagnosisand plan at this time      Tarry [facility] [place] Pager: [phone]  08/16/2017, 5:01 PM

## 2017-08-16 NOTE — Op Note (Addendum)
PATIENT:  Carmen Fisher  Preoperative diagnosis:  1. Right obstructing stone  Postoperative diagnosis:  1.  Same  Procedure:  1. Cystoscopy 2. Right ureteral stent placement (84fr x 24cm JJ)  3. Right retrograde pyelography with interpretation   Surgeon: Louis Meckel, [name]  Resident: Fredrik Rigger, M.D  Anesthesia: General  Complications: None  EBL: Minimal  Specimens: None  Indication: [name] is a 77 y.o. female with right obstructing stone and sepsis. After reviewing the management options for treatment, they have elected to proceed with the above surgical procedure(s). We have discussed the potential benefits and risks of the procedure, side effects of the proposed treatment, the likelihood of the patient achieving the goals of the procedure, and any potential problems that might occur during the procedure or recuperation. Informed consent has been obtained.  Findings:  - Atrophic vagina and narrow urethra  - Normal bladder with no tumors, lesions or masses  - Right RPG: hydronephrotic right renal system with filling defect in pelvis - Uncomplicated right ureteral stent placement 58fr x 24cm JJ without dangler Description of procedure:   The patient was taken to the operating room and general anesthesia was induced.  The patient was placed in the dorsal lithotomy position, prepped and draped in the usual sterile fashion, and preoperative antibiotics were administered. A preoperative time-out was performed.   Cystourethroscopy was performed.  The patient's urethra was examined and was normal. The bladder was then systematically examined in its entirety. There was no evidence for any bladder tumors, stones, or other mucosal pathology.    Attention then turned to the right ureteral orifice and a ureteral catheter was used to intubate the ureteral orifice. A sensor wire was passed into the renal pelvis. The 68fr was advanced to the renal pelvis and hydronephrotic drip  was obtained for culture. Omnipaque contrast was injected through the ureteral catheter and a retrograde pyelogram which revealed the above findings.  A Sensor guidewire was then readvanced up the 72fr catheter into the renal pelvis under fluoroscopic guidance.  A 68fr x 24cm JJ stent was passed up the sensor wire.  The stent was positioned appropriately under fluoroscopic and cystoscopic guidance.  The wire was then removed with an adequate stent curl noted in the renal pelvis as well as in the bladder.  The bladder was then emptied and the procedure ended.  The patient appeared to tolerate the procedure well and without complications.  The patient was able to be awakened and transferred to the recovery unit in satisfactory condition.   Plan:  Admit to intensivist for critical care monitoring  Follow up renal pelvis culture and narrow abx as able  Follow up for definitive stone treatment in future.

## 2017-08-16 NOTE — ED Notes (Signed)
Pt returned from imaging. Family remains at the bedside.  

## 2017-08-16 NOTE — ED Provider Notes (Signed)
Patient signed out with clinical concern for sepsis, encephalopathy, ICU admission.  Patient closely monitored in ED.  CRITICAL CARE Performed by: Mariea Clonts   Total critical care time: 40 minutes  Critical care time was exclusive of separately billable procedures and treating other patients.  Critical care was necessary to treat or prevent imminent or life-threatening deterioration.  Critical care was time spent personally by me on the following activities: development of treatment plan with patient and/or surrogate as well as nursing, discussions with consultants, evaluation of patient's response to treatment, examination of patient, obtaining history from patient or surrogate, ordering and performing treatments and interventions, ordering and review of laboratory studies, ordering and review of radiographic studies, pulse oximetry and re-evaluation of patient's condition.    Carmen Morrison, MD 08/18/17 [phone]

## 2017-08-16 NOTE — ED Provider Notes (Signed)
[name] Provider Note   CSN: [id number] Arrival date & time: 08/16/17  1132     History   Chief Complaint Chief Complaint  Patient presents with  . Stroke Like Symptoms  . Fatigue    HPI Carmen Fisher is a 77 y.o. female.  HPI   77 yo F with PMHx HTN, multiple myeloma here with weakness/confusion. History provided by daughter. Pt was "normal" yesterday, around 4 PM. She then had some [employer] that evening for dinner. Family checked on her today and she was c/o nausea and vomiting. She had associated weakness. Since then, she's been weaker, confused, and drowsy. On my assessment, unable to provide further history.  Level 5 caveat invoked as remainder of history, ROS, and physical exam limited due to patient's AMS.   Past Medical History:  Diagnosis Date  . Anemia, unspecified   . Cataracts, bilateral   . Chronic anticoagulation   . Diarrhea 03/14/2016  . Dyslipidemia   . GERD (gastroesophageal reflux disease)   . Glaucoma   . Glaucoma   . Gout   . History of bone marrow transplant ([place])   . Hypertension   . Hypokalemia   . Multiple myeloma ([place])   . Multiple myeloma, without mention of having achieved remission   . Osteoarthritis   . Osteoporosis   . Peripheral neuropathy     Patient Active Problem List   Diagnosis Date Noted  . SIRS (systemic inflammatory response syndrome) ([place]) 08/16/2017  . Diarrhea 03/14/2016  . Multiple myeloma not having achieved remission ([place]) 06/14/2015  . UTI (lower urinary tract infection) 12/05/2012  . Hypotension 12/05/2012  . Altered mental status 12/05/2012  . Dehydration 12/05/2012  . Leukopenia 12/05/2012  . ARF (acute renal failure) ([place]) 12/05/2012  . Elevated liver enzymes 12/05/2012  . Sepsis ([place]) 12/05/2012  . Neutropenic fever ([place]) 12/05/2012  . Multiple myeloma ([place])   . Chronic anticoagulation   . Multiple myeloma ([place]) 11/14/2011  . 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 Multiple myeloma 02/12/2011    Past Surgical History:  Procedure Laterality Date  . ABDOMINAL HYSTERECTOMY  1994   w/BSO  . APPENDECTOMY    . BONE MARROW TRANSPLANT       OB History   None      Home Medications    Prior to Admission medications   Medication Sig Start Date End Date Taking? Authorizing Provider  acetaminophen (TYLENOL) 500 MG tablet Take 500 mg by mouth every 6 (six) hours as needed.     Yes [provider]  allopurinol (ZYLOPRIM) 300 MG tablet TAKE 1 TABLET DAILY 07/21/17  Yes Curt Bears, MD  cholecalciferol (VITAMIN D) 1000 UNITS tablet Take 1,000 Units by mouth daily.   Yes [provider]  Cranberry 250 MG TABS Take 250 mg by mouth 2 (two) times daily.   Yes [provider]  dorzolamide-timolol (COSOPT) 22.3-6.8 MG/ML ophthalmic solution Place 1 drop into both eyes 2 (two) times daily.  12/09/16  Yes [provider]  gabapentin (NEURONTIN) 600 MG tablet Take 1 tablet (600 mg total) by mouth 2 (two) times daily. 07/21/17  Yes Curt Bears, MD  KLOR-CON M20 20 MEQ tablet TAKE 1 AND 1/2 TABLETS DAILY 12/25/16  Yes Curt Bears, MD  latanoprost (XALATAN) 0.005 % ophthalmic solution Place 1 drop into both eyes at bedtime.    Yes [provider]  loperamide (IMODIUM) 2 MG capsule TAKE 1 CAPSULE AS NEEDED  FOR DIARRHEA OR LOOSE STOOL(TAKE UP TO 6 CAPSULES     DAILY) 07/21/17  Yes Curt Bears, MD  LORazepam (ATIVAN) 1 MG tablet Take 1 mg by mouth every 4 (four) hours as needed (neuropathy). Reported on 09/14/2015   Yes [provider]  Multiple Vitamin (MULTIVITAMIN WITH MINERALS) TABS tablet Take 1 tablet by mouth daily.   Yes [provider]  pantoprazole (PROTONIX) 40 MG tablet Take 1 tablet (40 mg total) by mouth daily. 07/21/17  Yes Curt Bears, MD  prochlorperazine (COMPAZINE) 10 MG tablet Take 10 mg by mouth every 6 (six) hours as needed. Reported on  09/14/2015   Yes [provider]  timolol (BETIMOL) 0.5 % ophthalmic solution Place 1 drop into both eyes 2 (two) times daily.    Yes [provider]  warfarin (COUMADIN) 2 MG tablet TAKE 1 TABLET EVERY DAY 04/30/16  Yes Curt Bears, MD  diphenoxylate-atropine (LOMOTIL) 2.5-0.025 MG tablet Take 1 tablet by mouth 4 (four) times daily as needed for diarrhea or loose stools. Patient not taking: Reported on 08/16/2017 03/14/16   Curt Bears, MD  Potassium Chloride ER 20 MEQ TBCR Take 1.5 tablets by mouth daily. Patient not taking: Reported on 08/16/2017 04/25/17   Maryanna Shape, NP    Family History Family History  Problem Relation Age of Onset  . Stroke Mother   . Hypertension Mother   . Cancer Father        throat  . Cancer Sister        breast  . Sickle cell trait Sister   . Hypertension Unknown     Social History Social History   Tobacco Use  . Smoking status: Never Smoker  . Smokeless tobacco: Never Used  Substance Use Topics  . Alcohol use: No    Comment: Occasionally  . Drug use: No     Allergies   Aspirin; Ampicillin; Aspirin; Sulfa antibiotics; Sulfa antibiotics; and Penicillins   Review of Systems Review of Systems  Unable to perform ROS: Mental status change  Constitutional: Positive for chills, fatigue and fever.     Physical Exam Updated Vital Signs BP 104/65   Pulse (!) 132   Temp 98.8 F (37.1 C) (Oral)   Resp (!) 23   Wt 72.6 kg (160 lb)   SpO2 98%   BMI 25.82 kg/m   Physical Exam  Constitutional: She appears well-developed and well-nourished. She has a sickly appearance. She appears ill. She appears distressed.  HENT:  Head: Normocephalic and atraumatic.  Dry MM  Eyes: Conjunctivae are normal.  Neck: Neck supple.  Cardiovascular: Regular rhythm and normal heart sounds. Exam reveals no friction rub.  No murmur heard. Tachycardic  Pulmonary/Chest: Effort normal and breath sounds normal. No respiratory distress.  She has no wheezes. She has no rales.  Abdominal: She exhibits no distension.  Musculoskeletal: She exhibits no edema.  Neurological: She exhibits normal muscle tone.  Drowsy, responds to painful stimulation, occasionally voice. Oriented to person only. Moves all extremities. Face appears symmetric.  Skin: Skin is warm. Capillary refill takes less than 2 seconds.  Psychiatric: She has a normal mood and affect.  Nursing note and vitals reviewed.    ED Treatments / Results  Labs (all labs ordered are listed, but only abnormal results are displayed) Labs Reviewed  CBC WITH DIFFERENTIAL/PLATELET - Abnormal; Notable for the following components:      Result Value   WBC 3.7 (*)    MCV 100.5 (*)    Platelets  133 (*)    Lymphs Abs 0.3 (*)    Monocytes Absolute 0.0 (*)    All other components within normal limits  URINALYSIS, ROUTINE W REFLEX MICROSCOPIC - Abnormal; Notable for the following components:   Hgb urine dipstick SMALL (*)    Bacteria, UA RARE (*)    All other components within normal limits  COMPREHENSIVE METABOLIC PANEL - Abnormal; Notable for the following components:   Potassium 3.0 (*)    CO2 20 (*)    Glucose, Bld 111 (*)    Creatinine, Ser 1.79 (*)    Calcium 8.0 (*)    Total Protein 6.2 (*)    Albumin 3.2 (*)    Total Bilirubin 1.9 (*)    GFR calc non Af Amer 27 (*)    GFR calc Af Amer 31 (*)    All other components within normal limits  I-STAT CG4 LACTIC ACID, ED - Abnormal; Notable for the following components:   Lactic Acid, Venous 5.16 (*)    All other components within normal limits  I-STAT VENOUS BLOOD GAS, ED - Abnormal; Notable for the following components:   pCO2, Ven 40.8 (*)    pO2, Ven 31.0 (*)    All other components within normal limits  I-STAT CG4 LACTIC ACID, ED - Abnormal; Notable for the following components:   Lactic Acid, Venous 6.34 (*)    All other components within normal limits  I-STAT ARTERIAL BLOOD GAS, ED - Abnormal; Notable for  the following components:   pCO2 arterial 28.0 (*)    pO2, Arterial 73.0 (*)    Bicarbonate 15.8 (*)    TCO2 17 (*)    Acid-base deficit 8.0 (*)    All other components within normal limits  I-STAT CG4 LACTIC ACID, ED - Abnormal; Notable for the following components:   Lactic Acid, Venous 5.82 (*)    All other components within normal limits  CULTURE, BLOOD (ROUTINE X 2)  CULTURE, BLOOD (ROUTINE X 2)  URINE CULTURE  ANAEROBIC CULTURE  URINE CULTURE  PROTIME-INR  LIPASE, BLOOD  PROCALCITONIN  PHOSPHORUS  MAGNESIUM  BASIC METABOLIC PANEL  CBC  I-STAT CG4 LACTIC ACID, ED    EKG EKG Interpretation  Date/Time:  Saturday Aug 16 2017 12:59:34 EDT Ventricular Rate:  145 PR Interval:    QRS Duration: 77 QT Interval:  259 QTC Calculation: 403 R Axis:   39 Text Interpretation:  Sinus tachycardia LAE, consider biatrial enlargement RSR' in V1 or V2, right VCD or RVH Abnormal T, consider ischemia, lateral leads Since last EKG, non-specific ST-t changes, likely rate related Confirmed by Duffy Bruce [phone]) on 08/16/2017 2:15:01 PM   Radiology Ct Abdomen Pelvis Wo Contrast  Result Date: 08/16/2017 CLINICAL DATA:  Abdominal distention, nausea, vomiting EXAM: CT ABDOMEN AND PELVIS WITHOUT CONTRAST TECHNIQUE: Multidetector CT imaging of the abdomen and pelvis was performed following the standard protocol without IV contrast. COMPARISON:  Ultrasound 12/04/2016 FINDINGS: Lower chest: Bibasilar atelectasis.  Heart is normal size. Hepatobiliary: 2.2 cm low-density lesion in the right hepatic lobe peripherally likely corresponds to a cyst seen on prior ultrasound. Gallbladder grossly unremarkable. No biliary ductal dilatation. Pancreas: No focal abnormality or ductal dilatation. Spleen: No focal abnormality.  Normal size. Adrenals/Urinary Tract: Extensive bilateral perinephric stranding, right greater than left. Mild right hydronephrosis. 12 mm stone in the region of the right UPJ. Calcifications  noted inferiorly could reflect small ureteral stones. The ureter is difficult to visualize and follow due to the extensive perinephric stranding which extends  inferiorly around the ureter. No stones or hydronephrosis on the left. Adrenal glands and urinary bladder unremarkable. Small amount of gas in the urinary bladder presumably from catheterization. Stomach/Bowel: Stomach, large and small bowel grossly unremarkable. Vascular/Lymphatic: Aortic atherosclerosis. No enlarged abdominal or pelvic lymph nodes. Reproductive: Prior hysterectomy.  No adnexal masses. Other: No free fluid or free air. Musculoskeletal: No acute bony abnormality. IMPRESSION: Extensive bilateral perinephric stranding, right greater than left. There is mild right hydronephrosis with 12 mm right UPJ stone. Other calcifications noted inferiorly could be ureteral stones on the right but difficult to follow the right ureter with the extensive perinephric and peri ureteral stranding. Aortic atherosclerosis. Bibasilar atelectasis. Electronically Signed   By: Rolm Baptise [name]   On: 08/16/2017 16:47   Ct Head Wo Contrast  Result Date: 08/16/2017 CLINICAL DATA:  77 year old female with a history of nausea and vomiting EXAM: CT HEAD WITHOUT CONTRAST TECHNIQUE: Contiguous axial images were obtained from the base of the skull through the vertex without intravenous contrast. COMPARISON:  None. FINDINGS: Brain: No acute intracranial hemorrhage. No midline shift or mass effect. Gray-white differentiation maintained. Focal hypodensity lateral to the right caudate. Unremarkable appearance of the ventricular system. Vascular: Unremarkable. Skull: No acute fracture.  No aggressive bone lesion identified. Sinuses/Orbits: Unremarkable appearance of the orbits. Trace mucosal disease of the left frontal sinus. Other: None IMPRESSION: Negative for acute intracranial abnormality. Focal hypodensity lateral to the right caudate may represent dilated perivascular  space or prior lacunar infarction. Electronically Signed   By: Corrie Mckusick D.O.   On: 08/16/2017 14:25   Dg Chest Port 1 View  Result Date: 08/16/2017 CLINICAL DATA:  77 year old female with a history of shortness of breath and fever EXAM: PORTABLE CHEST 1 VIEW COMPARISON:  03/21/2010, 07/31/2009 FINDINGS: Cardiomediastinal silhouette accentuated by low lung volumes. Linear opacities at the lung bases. Right rotation somewhat limits evaluation. Accentuated mediastinal borders. No pneumothorax or pleural effusion. No large confluent airspace disease. No displaced fracture IMPRESSION: The accentuated heart borders are favored to represent low lung volumes and right rotation, however, mediastinal adenopathy is not excluded on this plain film. A formal PA and lateral chest x-ray may be useful. Likely atelectasis/consolidation at the lung bases. Electronically Signed   By: Corrie Mckusick D.O.   On: 08/16/2017 13:28    Procedures .Critical Care Performed by: Duffy Bruce, MD Authorized by: Duffy Bruce, MD   Critical care provider statement:    Critical care time (minutes):  75   Critical care time was exclusive of:  Separately billable procedures and treating other patients and teaching time   Critical care was necessary to treat or prevent imminent or life-threatening deterioration of the following conditions:  Sepsis, dehydration, circulatory failure and cardiac failure   Critical care was time spent personally by me on the following activities:  Development of treatment plan with patient or surrogate, discussions with consultants, evaluation of patient's response to treatment, examination of patient, obtaining history from patient or surrogate, ordering and performing treatments and interventions, ordering and review of laboratory studies, ordering and review of radiographic studies, pulse oximetry, re-evaluation of patient's condition and review of old charts   I assumed direction of critical  care for this patient from another provider in my specialty: no     (including critical care time)  Medications Ordered in ED Medications  piperacillin-tazobactam (ZOSYN) IVPB 3.375 g ( Intravenous Automatically Held 08/24/17 2100)  vancomycin (VANCOCIN) IVPB 750 mg/150 ml premix ( Intravenous Automatically Held 08/25/17 1300)  0.9 %  sodium chloride infusion ( Intravenous New Bag/Given 08/16/17 1818)  enoxaparin (LOVENOX) injection 40 mg ( Subcutaneous Automatically Held 08/24/17 1700)  ondansetron (ZOFRAN) injection 4 mg ( Intravenous MAR Hold 08/16/17 1726)  potassium chloride 10 mEq in 100 mL IVPB ( Intravenous Automatically Held 08/16/17 2115)  sodium chloride 0.9 % bolus 1,000 mL (0 mLs Intravenous Stopped 08/16/17 1441)    And  sodium chloride 0.9 % bolus 1,000 mL (0 mLs Intravenous Stopped 08/16/17 1440)    And  sodium chloride 0.9 % bolus 250 mL (0 mLs Intravenous Stopped 08/16/17 1411)  vancomycin (VANCOCIN) 1,250 mg in sodium chloride 0.9 % 250 mL IVPB (0 mg Intravenous Stopped 08/16/17 1452)  piperacillin-tazobactam (ZOSYN) IVPB 3.375 g (0 g Intravenous Stopped 08/16/17 1306)  acetaminophen (TYLENOL) suppository 650 mg (650 mg Rectal Given 08/16/17 1248)  acetaminophen (TYLENOL) suppository 325 mg (325 mg Rectal Given 08/16/17 1507)  sodium chloride 0.9 % bolus 1,000 mL (0 mLs Intravenous Stopped 08/16/17 1616)     Initial Impression / Assessment and Plan / ED Course  I have reviewed the triage vital signs and the nursing notes.  Pertinent labs & imaging results that were available during my care of the patient were reviewed by me and considered in my medical decision making (see chart for details).     77 yo F here with AMS, fever. On arrival, tp febrile, encephalopathic, tachycardic but not hypotensive. She will respond to stimulation, protecting airway a tthis time. Labs show marked bandemia, AKI, and lactic acidosis. ABG without CO2 retention. CXR concerning for PNA and UA with  pyuria. Given her marked lactic acidosis, tachycardia, and tenuous mental status, will consult intensivist. Pt protecting airway at this time. Broad-spectrum ABX, fluids given - mental status seems to be improving. Given the degree of lactate elevation, CT ordered to eval abdominal infection.  CT scan is concerning for pyelonephritis, also possible obstructing stone. Discussed with Urology Dr. Dayle Points. Pt will be taken to OR at [facility] then admit to ICU. Discussed with intensivist who is in agreement. Sat down and discussed findings, POC with family and pt who are in agreement. Pt is more awake, alert, and protecting her airway.  Final Clinical Impressions(s) / ED Diagnoses   Final diagnoses:  Severe sepsis [facility])  Nephrolithiasis  Encephalopathy    ED Discharge Orders    None       Duffy Bruce, MD 08/16/17 [phone]

## 2017-08-16 NOTE — ED Notes (Signed)
Applied ice packs at this time

## 2017-08-16 NOTE — Progress Notes (Addendum)
Pharmacy Antibiotic Note  Carmen Fisher is a 77 y.o. female admitted on 08/16/2017 with sepsis.  Pharmacy has been consulted for vancomycin/zosyn dosing. Patient has listed penicillin allergy of rash but has received Zosyn and Augmentin in the past. Tmax-100.1  Plan: Vancomycin 1250 mg X 1  Zosyn 3.375 gm over 30 min X1 F/U labs for further dosing  Addendum:  CMET now back. Scr-1.79 (Baseline around 1). CrCl~ 25 ml/min.   Vancomycin 750 mg every 24 hours Zosyn 3.375 gm every 8 hours (EI) Monitor renal function, patient tolerance of zosyn,ability to narrow       Temp (24hrs), Avg:100.1 F (37.8 C), Min:100.1 F (37.8 C), Max:100.1 F (37.8 C)    Allergies  Allergen Reactions  . Aspirin Anaphylaxis  . Ampicillin Rash  . Aspirin Swelling  . Penicillins     Rash and itching  . Sulfa Antibiotics Hives  . Sulfa Antibiotics Swelling    Antimicrobials this admission: Vanc 5/18>> Zosyn 5/18>>    Thank you for allowing pharmacy to be a part of this patient's care.  Jimmy Footman, PharmD, BCPS PGY2 Infectious Diseases Pharmacy Resident Pager: [phone]  08/16/2017 12:01 PM

## 2017-08-16 NOTE — ED Notes (Signed)
CC MD at the bedside.

## 2017-08-16 NOTE — Consult Note (Signed)
Urology Consult Note   Requesting Attending Physician:  Tarry Kos, MD Service Providing Consult: Urology  Consulting Attending: Louis Meckel   Reason for Consult:  Right obstructing stone, sepsis  HPI: Carmen Fisher is seen in consultation for reasons noted above at the request of Tarry Kos, MD for evaluation of Right obstructing stone and sepsis.  This is a 77 y.o. female with hx of MM recently on therapy, HTN, and GERD who presents with AMS and found to have a right obstructing stone.   Altered this morning and brought to the ED by family. On arrival she was febrile to 103.9 and tachycardic.  WBC - 3.7 Cr  1.79 Lactic acid - 6.34  U/A - negative Le, negative nitrites, rare bacteria, 6-10 WBC   CT revealed bilateral perinephric stranding. Mild right hydronephrosis. Difficult to follow the ureter, but likely 2-3 obstructing stones in the proximal to mid right ureter with a large 37m stone potentially ball valving the UPJ.   Hx of MM and was on immunosuppressive abx less than 1 week ago.   Past Medical History: Past Medical History:  Diagnosis Date  . Anemia, unspecified   . Cataracts, bilateral   . Chronic anticoagulation   . Diarrhea 03/14/2016  . Dyslipidemia   . GERD (gastroesophageal reflux disease)   . Glaucoma   . Glaucoma   . Gout   . [place]   . Hypertension   . Hypokalemia   . [place]   . Multiple myeloma, without mention of having achieved remission   . Osteoarthritis   . Osteoporosis   . Peripheral neuropathy     Past Surgical History:  Past Surgical History:  Procedure Laterality Date  . ABDOMINAL HYSTERECTOMY  1994   w/BSO  . APPENDECTOMY    . BONE MARROW TRANSPLANT      Medication: Current Facility-Administered Medications  Medication Dose Route Frequency Provider Last Rate Last Dose  . 0.9 %  sodium chloride infusion  250 mL Intravenous PRN RTarry Kos MD      .  enoxaparin (LOVENOX) injection 40 mg  40 mg Subcutaneous Q24H RTarry Kos MD      . piperacillin-tazobactam (ZOSYN) IVPB 3.375 g  3.375 g Intravenous Q8H [place]     . potassium chloride 10 mEq in 100 mL IVPB  10 mEq Intravenous Q1 Hr x 3 IDuffy Bruce MD 100 mL/hr at 08/16/17 1555 10 mEq at 08/16/17 1555  . [START ON 08/17/2017] vancomycin (VANCOCIN) IVPB 750 mg/150 ml premix  750 mg Intravenous Q24H [facility]      Current Outpatient Medications  Medication Sig Dispense Refill  . acetaminophen (TYLENOL) 500 MG tablet Take 500 mg by mouth every 6 (six) hours as needed.      .Marland Kitchenallopurinol (ZYLOPRIM) 300 MG tablet TAKE 1 TABLET DAILY 90 tablet 0  . cholecalciferol (VITAMIN D) 1000 UNITS tablet Take 1,000 Units by mouth daily.    . Cranberry 250 MG TABS Take 250 mg by mouth 2 (two) times daily.    . dorzolamide-timolol (COSOPT) 22.3-6.8 MG/ML ophthalmic solution Place 1 drop into both eyes 2 (two) times daily.     .Marland Kitchengabapentin (NEURONTIN) 600 MG tablet Take 1 tablet (600 mg total) by mouth 2 (two) times daily. 180 tablet 0  . KLOR-CON M20 20 MEQ tablet TAKE 1 AND 1/2 TABLETS DAILY 135 tablet 0  . latanoprost (XALATAN) 0.005 % ophthalmic solution  Place 1 drop into both eyes at bedtime.     Marland Kitchen loperamide (IMODIUM) 2 MG capsule TAKE 1 CAPSULE AS NEEDED   FOR DIARRHEA OR LOOSE STOOL(TAKE UP TO 6 CAPSULES     DAILY) 30 capsule 1  . LORazepam (ATIVAN) 1 MG tablet Take 1 mg by mouth every 4 (four) hours as needed (neuropathy). Reported on 09/14/2015    . Multiple Vitamin (MULTIVITAMIN WITH MINERALS) TABS tablet Take 1 tablet by mouth daily.    . pantoprazole (PROTONIX) 40 MG tablet Take 1 tablet (40 mg total) by mouth daily. 90 tablet 0  . prochlorperazine (COMPAZINE) 10 MG tablet Take 10 mg by mouth every 6 (six) hours as needed. Reported on 09/14/2015    . timolol (BETIMOL) 0.5 % ophthalmic solution Place 1 drop into both eyes 2 (two) times daily.     Marland Kitchen warfarin  (COUMADIN) 2 MG tablet TAKE 1 TABLET EVERY DAY 90 tablet 1  . diphenoxylate-atropine (LOMOTIL) 2.5-0.025 MG tablet Take 1 tablet by mouth 4 (four) times daily as needed for diarrhea or loose stools. (Patient not taking: Reported on 08/16/2017) 30 tablet 0  . Potassium Chloride ER 20 MEQ TBCR Take 1.5 tablets by mouth daily. (Patient not taking: Reported on 08/16/2017) 135 tablet 0    Allergies: Allergies  Allergen Reactions  . Aspirin Anaphylaxis  . Ampicillin Rash  . Aspirin Swelling  . Sulfa Antibiotics Hives  . Sulfa Antibiotics Swelling  . Penicillins Itching and Rash    Has patient had a PCN reaction causing immediate rash, facial/tongue/throat swelling, SOB or lightheadedness with hypotension: Yes Has patient had a PCN reaction causing severe rash involving mucus membranes or skin necrosis: No Has patient had a PCN reaction that required hospitalization: No Has patient had a PCN reaction occurring within the last 10 years: No If all of the above answers are "NO", then may proceed with Cephalosporin use.   Rash and itching Has received Zosyn and Augmentin in th    Social History: Social History   Tobacco Use  . Smoking status: Never Smoker  . Smokeless tobacco: Never Used  Substance Use Topics  . Alcohol use: No    Comment: Occasionally  . Drug use: No    Family History Family History  Problem Relation Age of Onset  . Stroke Mother   . Hypertension Mother   . Cancer Father        throat  . Cancer Sister        breast  . Sickle cell trait Sister   . Hypertension Unknown     Review of Systems 10 systems were reviewed and are negative except as noted specifically in the HPI.  Objective   Vital signs in last 24 hours: BP 117/73   Pulse (!) 134   Temp (!) 103.4 F (39.7 C) (Rectal)   Resp (!) 24   Wt 72.6 kg (160 lb)   SpO2 97%   BMI 25.82 kg/m   Physical Exam General: Altered, in bed HEENT: [place]/AT, EOMI, MMM Pulmonary: Normal work of  breathing Cardiovascular: HDS, adequate peripheral perfusion Abdomen: Soft, NTTP, nondistended,   GU: Right CVA tenderness  Extremities: warm and well perfused Neuro: Altered   Most Recent Labs: Lab Results  Component Value Date   WBC 3.7 (L) 08/16/2017   HGB 12.8 08/16/2017   HCT 39.8 08/16/2017   PLT 133 (L) 08/16/2017    Lab Results  Component Value Date   NA 141 08/16/2017   K 3.0 (L) 08/16/2017  CL 108 08/16/2017   CO2 20 (L) 08/16/2017   BUN 14 08/16/2017   CREATININE 1.79 (H) 08/16/2017   CALCIUM 8.0 (L) 08/16/2017   MG 1.7 12/06/2012    Lab Results  Component Value Date   INR 1.10 08/16/2017   APTT 26 06/04/2008     IMAGING: Ct Abdomen Pelvis Wo Contrast  Result Date: 08/16/2017 CLINICAL DATA:  Abdominal distention, nausea, vomiting EXAM: CT ABDOMEN AND PELVIS WITHOUT CONTRAST TECHNIQUE: Multidetector CT imaging of the abdomen and pelvis was performed following the standard protocol without IV contrast. COMPARISON:  Ultrasound 12/04/2016 FINDINGS: Lower chest: Bibasilar atelectasis.  Heart is normal size. Hepatobiliary: 2.2 cm low-density lesion in the right hepatic lobe peripherally likely corresponds to a cyst seen on prior ultrasound. Gallbladder grossly unremarkable. No biliary ductal dilatation. Pancreas: No focal abnormality or ductal dilatation. Spleen: No focal abnormality.  Normal size. Adrenals/Urinary Tract: Extensive bilateral perinephric stranding, right greater than left. Mild right hydronephrosis. 12 mm stone in the region of the right UPJ. Calcifications noted inferiorly could reflect small ureteral stones. The ureter is difficult to visualize and follow due to the extensive perinephric stranding which extends inferiorly around the ureter. No stones or hydronephrosis on the left. Adrenal glands and urinary bladder unremarkable. Small amount of gas in the urinary bladder presumably from catheterization. Stomach/Bowel: Stomach, large and small bowel  grossly unremarkable. Vascular/Lymphatic: Aortic atherosclerosis. No enlarged abdominal or pelvic lymph nodes. Reproductive: Prior hysterectomy.  No adnexal masses. Other: No free fluid or free air. Musculoskeletal: No acute bony abnormality. IMPRESSION: Extensive bilateral perinephric stranding, right greater than left. There is mild right hydronephrosis with 12 mm right UPJ stone. Other calcifications noted inferiorly could be ureteral stones on the right but difficult to follow the right ureter with the extensive perinephric and peri ureteral stranding. Aortic atherosclerosis. Bibasilar atelectasis. Electronically Signed   By: Rolm Baptise [name]   On: 08/16/2017 16:47   Ct Head Wo Contrast  Result Date: 08/16/2017 CLINICAL DATA:  77 year old female with a history of nausea and vomiting EXAM: CT HEAD WITHOUT CONTRAST TECHNIQUE: Contiguous axial images were obtained from the base of the skull through the vertex without intravenous contrast. COMPARISON:  None. FINDINGS: Brain: No acute intracranial hemorrhage. No midline shift or mass effect. Gray-white differentiation maintained. Focal hypodensity lateral to the right caudate. Unremarkable appearance of the ventricular system. Vascular: Unremarkable. Skull: No acute fracture.  No aggressive bone lesion identified. Sinuses/Orbits: Unremarkable appearance of the orbits. Trace mucosal disease of the left frontal sinus. Other: None IMPRESSION: Negative for acute intracranial abnormality. Focal hypodensity lateral to the right caudate may represent dilated perivascular space or prior lacunar infarction. Electronically Signed   By: Corrie Mckusick D.O.   On: 08/16/2017 14:25   Dg Chest Port 1 View  Result Date: 08/16/2017 CLINICAL DATA:  77 year old female with a history of shortness of breath and fever EXAM: PORTABLE CHEST 1 VIEW COMPARISON:  03/21/2010, 07/31/2009 FINDINGS: Cardiomediastinal silhouette accentuated by low lung volumes. Linear opacities at the lung  bases. Right rotation somewhat limits evaluation. Accentuated mediastinal borders. No pneumothorax or pleural effusion. No large confluent airspace disease. No displaced fracture IMPRESSION: The accentuated heart borders are favored to represent low lung volumes and right rotation, however, mediastinal adenopathy is not excluded on this plain film. A formal PA and lateral chest x-ray may be useful. Likely atelectasis/consolidation at the lung bases. Electronically Signed   By: Corrie Mckusick D.O.   On: 08/16/2017 13:28    ------  Assessment:  77 y.o. female with hx of MM, recently on immunosuppressive therapy, who presents with fever, tachycardia and CT scan showing 2-3 obstructing ureteral stone and a large obstructing renal stone. Discussed options with family: stent vs PCN. They have opted for stent placement   Risks (worsening infection, bleeding, damage to surrounding structures, and inability to place stent) discussed in detail. Benefits highlighted     Recommendations: - to OR for Right emergent stent  - Appreciate help by intensivist team with management of sepsis post op  - Will require definitive stone management after she recovers from sepsis    Thank you for this consult. Please contact the urology consult pager with any further questions/concerns.

## 2017-08-16 NOTE — Anesthesia Procedure Notes (Signed)
Procedure Name: Intubation Date/Time: 08/16/2017 5:55 PM Performed by: Clearnce Sorrel, CRNA Pre-anesthesia Checklist: Emergency Drugs available, Suction available, Patient identified, Patient being monitored and Timeout performed Patient Re-evaluated:Patient Re-evaluated prior to induction Oxygen Delivery Method: Circle system utilized Preoxygenation: Pre-oxygenation with 100% oxygen Induction Type: IV induction, Rapid sequence and Cricoid Pressure applied Laryngoscope Size: Mac and 3 Grade View: Grade I Tube type: Oral Tube size: 7.0 mm Number of attempts: 1 Airway Equipment and Method: Stylet Placement Confirmation: ETT inserted through vocal cords under direct vision,  positive ETCO2 and breath sounds checked- equal and bilateral Secured at: 22 cm Tube secured with: Tape Dental Injury: Teeth and Oropharynx as per pre-operative assessment

## 2017-08-16 NOTE — ED Triage Notes (Signed)
Per EMS: Pt from UC with c/o left sided weakness, altered LOC, and abd pain.  Pt's LKW 08/15/17 1600.  Daughter at bedside and states this happened a previous time with dehydration. PTA vitals: BP 180/106, HR 130, Temp 100.7, CBG 149, sp02 98% 3L [place].

## 2017-08-16 NOTE — Anesthesia Preprocedure Evaluation (Signed)
Anesthesia Evaluation  Patient identified by MRN, date of birth, ID band Patient awake    Reviewed: Allergy & Precautions, NPO status , Patient's Chart, lab work & pertinent test results  History of Anesthesia Complications Negative for: history of anesthetic complications  Airway Mallampati: II  TM Distance: >3 FB Neck ROM: Full    Dental  (+) Upper Dentures   Pulmonary neg pulmonary ROS,    breath sounds clear to auscultation       Cardiovascular hypertension,  Rhythm:Regular Rate:Tachycardia     Neuro/Psych  Neuromuscular disease negative psych ROS   GI/Hepatic GERD  ,  Endo/Other    Renal/GU ARFRenal disease     Musculoskeletal  (+) Arthritis ,   Abdominal   Peds  Hematology   Anesthesia Other Findings   Reproductive/Obstetrics                             Anesthesia Physical Anesthesia Plan  ASA: II and emergent  Anesthesia Plan: General   Post-op Pain Management:    Induction: Intravenous, Rapid sequence and Cricoid pressure planned  PONV Risk Score and Plan: 3 and Ondansetron and Dexamethasone  Airway Management Planned: Oral ETT  Additional Equipment: None  Intra-op Plan:   Post-operative Plan: Extubation in OR  Informed Consent: I have reviewed the patients History and Physical, chart, labs and discussed the procedure including the risks, benefits and alternatives for the proposed anesthesia with the patient or authorized representative who has indicated his/her understanding and acceptance.   Dental advisory given  Plan Discussed with: CRNA and Surgeon  Anesthesia Plan Comments:         Anesthesia Quick Evaluation

## 2017-08-16 NOTE — Transfer of Care (Signed)
Immediate Anesthesia Transfer of Care Note  Patient: Carmen Fisher  Procedure(s) Performed: CYSTOSCOPY WITH RETROGRADE PYELOGRAM/URETERAL STENT PLACEMENT (Right )  Patient Location: PACU  Anesthesia Type:General  Level of Consciousness: awake and oriented  Airway & Oxygen Therapy: Patient Spontanous Breathing and Patient connected to nasal cannula oxygen  Post-op Assessment: Report given to RN and Post -op Vital signs reviewed and stable  Post vital signs: Reviewed and stable  Last Vitals:  Vitals Value Taken Time  BP 81/56 08/16/2017  6:29 PM  Temp    Pulse 122 08/16/2017  6:30 PM  Resp 19 08/16/2017  6:30 PM  SpO2 92 % 08/16/2017  6:30 PM  Vitals shown include unvalidated device data.  Last Pain:  Vitals:   08/16/17 1713  TempSrc:   PainSc: 0-No pain         Complications: No apparent anesthesia complications

## 2017-08-16 NOTE — ED Notes (Signed)
Writer notified EPD Zavitz of abnormal I stat lactic result 5.82

## 2017-08-16 NOTE — Anesthesia Postprocedure Evaluation (Signed)
Anesthesia Post Note  Patient: Carmen Fisher  Procedure(s) Performed: CYSTOSCOPY WITH RETROGRADE PYELOGRAM/URETERAL STENT PLACEMENT (Right )     Patient location during evaluation: PACU Anesthesia Type: General Level of consciousness: awake and patient cooperative Pain management: pain level controlled Vital Signs Assessment: post-procedure vital signs reviewed and stable Respiratory status: spontaneous breathing, nonlabored ventilation, respiratory function stable and patient connected to nasal cannula oxygen Cardiovascular status: tachycardic (mild hypotension in setting of urosepsis, receiving fluid bolus and IV neo gtt, transfer to ICU) Postop Assessment: no apparent nausea or vomiting Anesthetic complications: no    Last Vitals:  Vitals:   08/16/17 1930 08/16/17 1945  BP: (!) 91/57 (!) 86/61  Pulse: (!) 113 (!) 112  Resp: 17 15  Temp:    SpO2: 96% 97%    Last Pain:  Vitals:   08/16/17 1930  TempSrc:   PainSc: 7                  Erina Hamme

## 2017-08-17 ENCOUNTER — Encounter (HOSPITAL_COMMUNITY): Payer: Self-pay | Admitting: Urology

## 2017-08-17 LAB — BLOOD CULTURE ID PANEL (REFLEXED)

## 2017-08-17 LAB — CBC
HCT: 30.8 % — ABNORMAL LOW (ref 36.0–46.0)
Hemoglobin: 9.7 g/dL — ABNORMAL LOW (ref 12.0–15.0)
MCH: 32.6 pg (ref 26.0–34.0)
MCHC: 31.5 g/dL (ref 30.0–36.0)
MCV: 103.4 fL — ABNORMAL HIGH (ref 78.0–100.0)
Platelets: 90 10*3/uL — ABNORMAL LOW (ref 150–400)
RBC: 2.98 MIL/uL — ABNORMAL LOW (ref 3.87–5.11)
RDW: 14.2 % (ref 11.5–15.5)
WBC: 18.3 10*3/uL — ABNORMAL HIGH (ref 4.0–10.5)

## 2017-08-17 LAB — BASIC METABOLIC PANEL
Anion gap: 12 (ref 5–15)
BUN: 15 mg/dL (ref 6–20)
CO2: 19 mmol/L — ABNORMAL LOW (ref 22–32)
Calcium: 7 mg/dL — ABNORMAL LOW (ref 8.9–10.3)
Chloride: 112 mmol/L — ABNORMAL HIGH (ref 101–111)
Creatinine, Ser: 1.47 mg/dL — ABNORMAL HIGH (ref 0.44–1.00)
GFR calc Af Amer: 39 mL/min — ABNORMAL LOW (ref >60)
GFR calc non Af Amer: 34 mL/min — ABNORMAL LOW (ref >60)
Glucose, Bld: 113 mg/dL — ABNORMAL HIGH (ref 65–99)
Potassium: 3.6 mmol/L (ref 3.5–5.1)
Sodium: 143 mmol/L (ref 135–145)

## 2017-08-17 LAB — PHOSPHORUS: Phosphorus: 3.3 mg/dL (ref 2.5–4.6)

## 2017-08-17 LAB — GLUCOSE, CAPILLARY: Glucose-Capillary: 95 mg/dL (ref 65–99)

## 2017-08-17 LAB — LACTIC ACID, PLASMA: Lactic Acid, Venous: 3.6 mmol/L (ref 0.5–1.9)

## 2017-08-17 LAB — MAGNESIUM: Magnesium: 1.1 mg/dL — ABNORMAL LOW (ref 1.7–2.4)

## 2017-08-17 MED ORDER — ACETAMINOPHEN 325 MG PO TABS
650.0000 mg | ORAL_TABLET | ORAL | Status: DC | PRN
  Administered 2017-08-17 – 2017-08-21 (×10): 650 mg via ORAL
  Filled 2017-08-17 (×9): qty 2

## 2017-08-17 MED ORDER — CEFTRIAXONE SODIUM 2 G IJ SOLR
2.0000 g | INTRAMUSCULAR | Status: DC
Start: 2017-08-18 — End: 2017-08-21
  Administered 2017-08-18 – 2017-08-21 (×4): 2 g via INTRAVENOUS
  Filled 2017-08-17 (×4): qty 20

## 2017-08-17 MED ORDER — WHITE PETROLATUM EX OINT
TOPICAL_OINTMENT | CUTANEOUS | Status: AC
  Administered 2017-08-17: 15:00:00
  Filled 2017-08-17: qty 28.35

## 2017-08-17 MED ORDER — SODIUM CHLORIDE 0.9 % IV SOLN
2.0000 g | Freq: Two times a day (BID) | INTRAVENOUS | Status: DC
  Administered 2017-08-17 (×2): 2 g via INTRAVENOUS
  Filled 2017-08-17 (×2): qty 20

## 2017-08-17 MED ORDER — DEXTROSE 5 % IV SOLN
30.0000 mmol | Freq: Once | INTRAVENOUS | Status: AC
  Administered 2017-08-17: 30 mmol via INTRAVENOUS
  Filled 2017-08-17: qty 10

## 2017-08-17 MED ORDER — MAGNESIUM SULFATE 4 GM/100ML IV SOLN
4.0000 g | Freq: Once | INTRAVENOUS | Status: AC
  Administered 2017-08-17: 4 g via INTRAVENOUS
  Filled 2017-08-17 (×2): qty 100

## 2017-08-17 NOTE — Progress Notes (Signed)
PHARMACY NOTE:  ANTIMICROBIAL RENAL DOSAGE ADJUSTMENT  Current antimicrobial regimen includes a mismatch between antimicrobial dosage and estimated renal function.  As per policy approved by the Pharmacy & Therapeutics and Medical Executive Committees, the antimicrobial dosage will be adjusted accordingly.  Current antimicrobial dosage:  Ceftriaxone 2 gm every 12 hours   Indication: Bacteremia  Renal Function:  Estimated Creatinine Clearance: 31.4 mL/min (A) (by C-G formula based on SCr of 1.47 mg/dL (H)). []      On intermittent HD, scheduled: []      On CRRT    Antimicrobial dosage has been changed to:  Ceftriaxone 2 gm every 24 hours- Bacteremia dosing   Additional comments:   Thank you for allowing pharmacy to be a part of this patient's care.  Jimmy Footman, PharmD, BCPS PGY2 Infectious Diseases Pharmacy Resident Pager: [phone]  08/17/2017 12:34 PM

## 2017-08-17 NOTE — Progress Notes (Signed)
CRITICAL VALUE ALERT  Critical Value: lactic acid   Date & Time Notied: 08/17/2017 02:15  Provider Notified: Warren Lacy RN.   Orders Received/Actions taken: RN will notify MD

## 2017-08-17 NOTE — Progress Notes (Signed)
Urology Progress Note   1 Day Post-Op  Subjective: NAEON.  Pain controlled  WBC - 18, Cr 1.47  UOP - 930  Blood cx - Enterococcus and Klebsiella   Objective: Vital signs in last 24 hours: Temp:  [98.1 F (36.7 C)-103.9 F (39.9 C)] 99.6 F (37.6 C) (05/19 0716) Pulse Rate:  [106-149] 106 (05/19 0400) Resp:  [15-26] 21 (05/19 0400) BP: (75-183)/(50-103) 104/69 (05/19 0400) SpO2:  [90 %-98 %] 97 % (05/19 0400) Weight:  [72.6 kg (160 lb)-78.8 kg (173 lb 11.6 oz)] 78.8 kg (173 lb 11.6 oz) (05/19 0600)  Intake/Output from previous day: 05/18 0701 - 05/19 0700 In: 7933.3 [I.V.:1900; IV Piggyback:6033.3] Out: 930 [Urine:930] Intake/Output this shift: No intake/output data recorded.  Physical Exam:  General: Alert and oriented CV: RRR Lungs: Clear Abdomen: Soft, non tender  Ext: NT, No erythema  Lab Results: Recent Labs    08/16/17 1220 08/17/17 0326  HGB 12.8 9.7*  HCT 39.8 30.8*   BMET Recent Labs    08/16/17 1415 08/17/17 0326  NA 141 143  K 3.0* 3.6  CL 108 112*  CO2 20* 19*  GLUCOSE 111* 113*  BUN 14 15  CREATININE 1.79* 1.47*  CALCIUM 8.0* 7.0*     Studies/Results: Ct Abdomen Pelvis Wo Contrast  Result Date: 08/16/2017 CLINICAL DATA:  Abdominal distention, nausea, vomiting EXAM: CT ABDOMEN AND PELVIS WITHOUT CONTRAST TECHNIQUE: Multidetector CT imaging of the abdomen and pelvis was performed following the standard protocol without IV contrast. COMPARISON:  Ultrasound 12/04/2016 FINDINGS: Lower chest: Bibasilar atelectasis.  Heart is normal size. Hepatobiliary: 2.2 cm low-density lesion in the right hepatic lobe peripherally likely corresponds to a cyst seen on prior ultrasound. Gallbladder grossly unremarkable. No biliary ductal dilatation. Pancreas: No focal abnormality or ductal dilatation. Spleen: No focal abnormality.  Normal size. Adrenals/Urinary Tract: Extensive bilateral perinephric stranding, right greater than left. Mild right hydronephrosis. 12  mm stone in the region of the right UPJ. Calcifications noted inferiorly could reflect small ureteral stones. The ureter is difficult to visualize and follow due to the extensive perinephric stranding which extends inferiorly around the ureter. No stones or hydronephrosis on the left. Adrenal glands and urinary bladder unremarkable. Small amount of gas in the urinary bladder presumably from catheterization. Stomach/Bowel: Stomach, large and small bowel grossly unremarkable. Vascular/Lymphatic: Aortic atherosclerosis. No enlarged abdominal or pelvic lymph nodes. Reproductive: Prior hysterectomy.  No adnexal masses. Other: No free fluid or free air. Musculoskeletal: No acute bony abnormality. IMPRESSION: Extensive bilateral perinephric stranding, right greater than left. There is mild right hydronephrosis with 12 mm right UPJ stone. Other calcifications noted inferiorly could be ureteral stones on the right but difficult to follow the right ureter with the extensive perinephric and peri ureteral stranding. Aortic atherosclerosis. Bibasilar atelectasis. Electronically Signed   By: Rolm Baptise [name]   On: 08/16/2017 16:47   Ct Head Wo Contrast  Result Date: 08/16/2017 CLINICAL DATA:  77 year old female with a history of nausea and vomiting EXAM: CT HEAD WITHOUT CONTRAST TECHNIQUE: Contiguous axial images were obtained from the base of the skull through the vertex without intravenous contrast. COMPARISON:  None. FINDINGS: Brain: No acute intracranial hemorrhage. No midline shift or mass effect. Gray-white differentiation maintained. Focal hypodensity lateral to the right caudate. Unremarkable appearance of the ventricular system. Vascular: Unremarkable. Skull: No acute fracture.  No aggressive bone lesion identified. Sinuses/Orbits: Unremarkable appearance of the orbits. Trace mucosal disease of the left frontal sinus. Other: None IMPRESSION: Negative for acute intracranial abnormality. Focal  hypodensity lateral to  the right caudate may represent dilated perivascular space or prior lacunar infarction. Electronically Signed   By: Corrie Mckusick D.O.   On: 08/16/2017 14:25   Dg Chest Port 1 View  Result Date: 08/16/2017 CLINICAL DATA:  77 year old female with a history of shortness of breath and fever EXAM: PORTABLE CHEST 1 VIEW COMPARISON:  03/21/2010, 07/31/2009 FINDINGS: Cardiomediastinal silhouette accentuated by low lung volumes. Linear opacities at the lung bases. Right rotation somewhat limits evaluation. Accentuated mediastinal borders. No pneumothorax or pleural effusion. No large confluent airspace disease. No displaced fracture IMPRESSION: The accentuated heart borders are favored to represent low lung volumes and right rotation, however, mediastinal adenopathy is not excluded on this plain film. A formal PA and lateral chest x-ray may be useful. Likely atelectasis/consolidation at the lung bases. Electronically Signed   By: Corrie Mckusick D.O.   On: 08/16/2017 13:28    Assessment/Plan:  77 y.o. female s/p right stent placement. Blood cultures growing Enterococcus and Klebsiella.  Overall improving   - Continue broad spectrum coverage and narrow abx as able  - Urology will coordinate outpatient definitive stone surgery once patient resolves her current infection - Please contact urology for any issues or concerns  Dispo: per primary team    LOS: 1 day   Alla Feeling, MD 08/17/2017, 9:02 AM

## 2017-08-17 NOTE — Plan of Care (Signed)
  Problem: Clinical Measurements: Goal: Ability to maintain clinical measurements within normal limits will improve Outcome: Progressing   Problem: Clinical Measurements: Goal: Respiratory complications will improve Outcome: Progressing   Problem: Clinical Measurements: Goal: Cardiovascular complication will be avoided Outcome: Progressing   

## 2017-08-17 NOTE — Progress Notes (Signed)
PULMONARY / CRITICAL CARE MEDICINE   Name: Carmen Fisher MRN: [id number] DOB: 1941-03-13    ADMISSION DATE:  08/16/2017  CHIEF COMPLAINT:  Nausea ,vomiting,fever and chills  HISTORY OF PRESENT ILLNESS:   Patient developed nausea and vomiting overnight. Has a fever of 103.9 in the ED.  When she firs t presented she was quite obtunded. Has since become more awake and responsive. Patient denies fla nk pain or dysuria.She has aistory of UTIs in the past. She hasd a history of multiple Myeloma but haS NOT HAD ANY THERAPY FOR THE Past 2 months. CT of head was unremarkable. CT of the abdomen revealed a a 72m stone at the right UPJ with mild hydronephrosis which is the likely cause of her symptoms. Urology has been contacted and will be coming in to see the patient and likely take her to the OR.  Subjective: Feels better, no new complaints  VITAL SIGNS: BP 104/69 (BP Location: Left Arm)   Pulse (!) 106   Temp 99.6 F (37.6 C) (Oral)   Resp (!) 21   Ht 5' 1"  (1.549 m)   Wt 173 lb 11.6 oz (78.8 kg)   SpO2 97%   BMI 32.82 kg/m    INTAKE / OUTPUT: I/O last 3 completed shifts: In: 7933.3 [I.V.:1900; IV Piggyback:6033.3] Out: 930 [Urine:930]  PHYSICAL EXAMINATION: General:  Well appearing, NAD Neuro:  Alert and interactive, moving all ext to command HEENT:  [place]/AT, PERRL, EOM-I and MMM Cardiovascular:  RRR, Nl S1/S2 and -M/R/G Lungs: CTA bilaterally Abdomen: Soft, NT, ND and +BS Musculoskeletal:  -edema and -tenderness Skin: Intact  LABS:  BMET Recent Labs  Lab 08/16/17 1415 08/17/17 0326  NA 141 143  K 3.0* 3.6  CL 108 112*  CO2 20* 19*  BUN 14 15  CREATININE 1.79* 1.47*  GLUCOSE 111* 113*   Electrolytes Recent Labs  Lab 08/16/17 1415 08/17/17 0326  CALCIUM 8.0* 7.0*  MG  --  1.1*  PHOS  --  3.3   CBC Recent Labs  Lab 08/16/17 1220 08/17/17 0326  WBC 3.7* 18.3*  HGB 12.8 9.7*  HCT 39.8 30.8*  PLT 133* 90*    Coag's Recent Labs  Lab 08/16/17 1415   INR 1.10    Sepsis Markers Recent Labs  Lab 08/16/17 1415 08/16/17 1426 08/16/17 1659 08/17/17 0024  LATICACIDVEN  --  6.34* 5.82* 3.6*  PROCALCITON 55.33  --   --   --     ABG Recent Labs  Lab 08/16/17 1544  PHART 7.360  PCO2ART 28.0*  PO2ART 73.0*    Liver Enzymes Recent Labs  Lab 08/16/17 1415  AST 33  ALT 18  ALKPHOS 50  BILITOT 1.9*  ALBUMIN 3.2*    Cardiac Enzymes No results for input(s): TROPONINI, PROBNP in the last 168 hours.  Glucose Recent Labs  Lab 08/16/17 2023 08/17/17 0116  GLUCAP 78 95    Imaging Ct Abdomen Pelvis Wo Contrast  Result Date: 08/16/2017 CLINICAL DATA:  Abdominal distention, nausea, vomiting EXAM: CT ABDOMEN AND PELVIS WITHOUT CONTRAST TECHNIQUE: Multidetector CT imaging of the abdomen and pelvis was performed following the standard protocol without IV contrast. COMPARISON:  Ultrasound 12/04/2016 FINDINGS: Lower chest: Bibasilar atelectasis.  Heart is normal size. Hepatobiliary: 2.2 cm low-density lesion in the right hepatic lobe peripherally likely corresponds to a cyst seen on prior ultrasound. Gallbladder grossly unremarkable. No biliary ductal dilatation. Pancreas: No focal abnormality or ductal dilatation. Spleen: No focal abnormality.  Normal size. Adrenals/Urinary Tract: Extensive bilateral perinephric  stranding, right greater than left. Mild right hydronephrosis. 12 mm stone in the region of the right UPJ. Calcifications noted inferiorly could reflect small ureteral stones. The ureter is difficult to visualize and follow due to the extensive perinephric stranding which extends inferiorly around the ureter. No stones or hydronephrosis on the left. Adrenal glands and urinary bladder unremarkable. Small amount of gas in the urinary bladder presumably from catheterization. Stomach/Bowel: Stomach, large and small bowel grossly unremarkable. Vascular/Lymphatic: Aortic atherosclerosis. No enlarged abdominal or pelvic lymph nodes.  Reproductive: Prior hysterectomy.  No adnexal masses. Other: No free fluid or free air. Musculoskeletal: No acute bony abnormality. IMPRESSION: Extensive bilateral perinephric stranding, right greater than left. There is mild right hydronephrosis with 12 mm right UPJ stone. Other calcifications noted inferiorly could be ureteral stones on the right but difficult to follow the right ureter with the extensive perinephric and peri ureteral stranding. Aortic atherosclerosis. Bibasilar atelectasis. Electronically Signed   By: Rolm Baptise [name]   On: 08/16/2017 16:47   Ct Head Wo Contrast  Result Date: 08/16/2017 CLINICAL DATA:  77 year old female with a history of nausea and vomiting EXAM: CT HEAD WITHOUT CONTRAST TECHNIQUE: Contiguous axial images were obtained from the base of the skull through the vertex without intravenous contrast. COMPARISON:  None. FINDINGS: Brain: No acute intracranial hemorrhage. No midline shift or mass effect. Gray-white differentiation maintained. Focal hypodensity lateral to the right caudate. Unremarkable appearance of the ventricular system. Vascular: Unremarkable. Skull: No acute fracture.  No aggressive bone lesion identified. Sinuses/Orbits: Unremarkable appearance of the orbits. Trace mucosal disease of the left frontal sinus. Other: None IMPRESSION: Negative for acute intracranial abnormality. Focal hypodensity lateral to the right caudate may represent dilated perivascular space or prior lacunar infarction. Electronically Signed   By: Corrie Mckusick D.O.   On: 08/16/2017 14:25   Dg Chest Port 1 View  Result Date: 08/16/2017 CLINICAL DATA:  77 year old female with a history of shortness of breath and fever EXAM: PORTABLE CHEST 1 VIEW COMPARISON:  03/21/2010, 07/31/2009 FINDINGS: Cardiomediastinal silhouette accentuated by low lung volumes. Linear opacities at the lung bases. Right rotation somewhat limits evaluation. Accentuated mediastinal borders. No pneumothorax or pleural  effusion. No large confluent airspace disease. No displaced fracture IMPRESSION: The accentuated heart borders are favored to represent low lung volumes and right rotation, however, mediastinal adenopathy is not excluded on this plain film. A formal PA and lateral chest x-ray may be useful. Likely atelectasis/consolidation at the lung bases. Electronically Signed   By: Corrie Mckusick D.O.   On: 08/16/2017 13:28   I reviewed CXR myself, no acute disease noted  Discussed with PCCM-NP and TRH-MD    ASSESSMENT / PLAN: 77 year old female presenting with UTI, AMS and hypotension that is now resolved  Hypotensive  - IVF resuscitation   Hypokalemia:  - KPhos ordered  Hypomag:  - 4 gm of mag ordered  Hypophos:  - K3PO4 30 mmol given  Dispo: transfer to tele and to [facility] with PCCM off 5/20.  Rush Farmer, [name] [facility] Pulmonary/Critical Care Medicine. Pager: [phone]. After hours pager: [phone].  08/17/2017, 9:00 AM

## 2017-08-17 NOTE — Progress Notes (Signed)
PHARMACY - PHYSICIAN COMMUNICATION CRITICAL VALUE ALERT - BLOOD CULTURE IDENTIFICATION (BCID)  Carmen Fisher is an 77 y.o. female who presented to [facility] on 08/16/2017 with a chief complaint of weakness  Assessment:  3/4 BC positive for K pneumoniae.  Suspected urinary source.  Rec change to rocephin 2gm IV q12 hours  Name of physician (or Provider) Contacted: D E Deterding  Current antibiotics: vanc and zosyn  Changes to prescribed antibiotics recommended:  Recommendations accepted by provider  Results for orders placed or performed during the hospital encounter of 08/16/17  Blood Culture ID Panel (Reflexed) (Collected: 08/16/2017 12:20 PM)  Result Value Ref Range   Enterococcus species NOT DETECTED NOT DETECTED   Listeria monocytogenes NOT DETECTED NOT DETECTED   Staphylococcus species NOT DETECTED NOT DETECTED   Staphylococcus aureus NOT DETECTED NOT DETECTED   Streptococcus species NOT DETECTED NOT DETECTED   Streptococcus agalactiae NOT DETECTED NOT DETECTED   Streptococcus pneumoniae NOT DETECTED NOT DETECTED   Streptococcus pyogenes NOT DETECTED NOT DETECTED   Acinetobacter baumannii NOT DETECTED NOT DETECTED   Enterobacteriaceae species DETECTED (A) NOT DETECTED   Enterobacter cloacae complex NOT DETECTED NOT DETECTED   Escherichia coli NOT DETECTED NOT DETECTED   Klebsiella oxytoca NOT DETECTED NOT DETECTED   Klebsiella pneumoniae DETECTED (A) NOT DETECTED   Proteus species NOT DETECTED NOT DETECTED   Serratia marcescens NOT DETECTED NOT DETECTED   Carbapenem resistance NOT DETECTED NOT DETECTED   Haemophilus influenzae NOT DETECTED NOT DETECTED   Neisseria meningitidis NOT DETECTED NOT DETECTED   Pseudomonas aeruginosa NOT DETECTED NOT DETECTED   Candida albicans NOT DETECTED NOT DETECTED   Candida glabrata NOT DETECTED NOT DETECTED   Candida krusei NOT DETECTED NOT DETECTED   Candida parapsilosis NOT DETECTED NOT DETECTED   Candida tropicalis NOT DETECTED NOT  DETECTED    Excell Seltzer Poteet 08/17/2017  1:47 AM

## 2017-08-17 NOTE — Progress Notes (Signed)
eLink Physician-Brief Progress Note Patient Name: AUTRY DROEGE DOB: [birth year] MRN: [id number]   Date of Service  08/17/2017  HPI/Events of Note  Hypomag  eICU Interventions  Mag replaced     Intervention Category Intermediate Interventions: Electrolyte abnormality - evaluation and management  DETERDING,ELIZABETH 08/17/2017, 6:39 AM

## 2017-08-18 ENCOUNTER — Telehealth: Payer: Self-pay | Admitting: Medical Oncology

## 2017-08-18 DIAGNOSIS — N10 Acute tubulo-interstitial nephritis: Secondary | ICD-10-CM

## 2017-08-18 LAB — BASIC METABOLIC PANEL
Anion gap: 12 (ref 5–15)
BUN: 10 mg/dL (ref 6–20)
CO2: 20 mmol/L — ABNORMAL LOW (ref 22–32)
Calcium: 7.4 mg/dL — ABNORMAL LOW (ref 8.9–10.3)
Chloride: 109 mmol/L (ref 101–111)
Creatinine, Ser: 1.02 mg/dL — ABNORMAL HIGH (ref 0.44–1.00)
GFR calc Af Amer: >60 mL/min (ref >60)
GFR calc non Af Amer: 52 mL/min — ABNORMAL LOW (ref >60)
Glucose, Bld: 102 mg/dL — ABNORMAL HIGH (ref 65–99)
Potassium: 3.2 mmol/L — ABNORMAL LOW (ref 3.5–5.1)
Sodium: 141 mmol/L (ref 135–145)

## 2017-08-18 LAB — CBC
HCT: 29.8 % — ABNORMAL LOW (ref 36.0–46.0)
Hemoglobin: 9.7 g/dL — ABNORMAL LOW (ref 12.0–15.0)
MCH: 32.2 pg (ref 26.0–34.0)
MCHC: 32.6 g/dL (ref 30.0–36.0)
MCV: 99 fL (ref 78.0–100.0)
Platelets: 86 10*3/uL — ABNORMAL LOW (ref 150–400)
RBC: 3.01 MIL/uL — ABNORMAL LOW (ref 3.87–5.11)
RDW: 14.2 % (ref 11.5–15.5)
WBC: 18.5 10*3/uL — ABNORMAL HIGH (ref 4.0–10.5)

## 2017-08-18 LAB — URINE CULTURE
Culture: >=100000 — AB
Culture: >=100000 — AB

## 2017-08-18 LAB — PHOSPHORUS: Phosphorus: 1.9 mg/dL — ABNORMAL LOW (ref 2.5–4.6)

## 2017-08-18 LAB — MAGNESIUM: Magnesium: 2.4 mg/dL (ref 1.7–2.4)

## 2017-08-18 MED ORDER — LATANOPROST 0.005 % OP SOLN
1.0000 [drp] | Freq: Every day | OPHTHALMIC | Status: DC
  Administered 2017-08-18 – 2017-08-21 (×4): 1 [drp] via OPHTHALMIC
  Filled 2017-08-18: qty 2.5

## 2017-08-18 MED ORDER — ADULT MULTIVITAMIN W/MINERALS CH
1.0000 | ORAL_TABLET | Freq: Every day | ORAL | Status: DC
  Administered 2017-08-18 – 2017-08-22 (×5): 1 via ORAL
  Filled 2017-08-18 (×5): qty 1

## 2017-08-18 MED ORDER — POTASSIUM CHLORIDE 10 MEQ/100ML IV SOLN
10.0000 meq | INTRAVENOUS | Status: AC
  Administered 2017-08-18 (×4): 10 meq via INTRAVENOUS
  Filled 2017-08-18 (×4): qty 100

## 2017-08-18 MED ORDER — PANTOPRAZOLE SODIUM 40 MG PO TBEC
40.0000 mg | DELAYED_RELEASE_TABLET | Freq: Every day | ORAL | Status: DC
  Administered 2017-08-18 – 2017-08-22 (×5): 40 mg via ORAL
  Filled 2017-08-18 (×5): qty 1

## 2017-08-18 MED ORDER — ENOXAPARIN SODIUM 40 MG/0.4ML ~~LOC~~ SOLN
40.0000 mg | SUBCUTANEOUS | Status: DC
  Administered 2017-08-19 – 2017-08-22 (×4): 40 mg via SUBCUTANEOUS
  Filled 2017-08-18 (×5): qty 0.4

## 2017-08-18 MED ORDER — VITAMIN D 1000 UNITS PO TABS
1000.0000 [IU] | ORAL_TABLET | Freq: Every day | ORAL | Status: DC
  Administered 2017-08-18 – 2017-08-22 (×5): 1000 [IU] via ORAL
  Filled 2017-08-18 (×5): qty 1

## 2017-08-18 MED ORDER — GABAPENTIN 600 MG PO TABS
600.0000 mg | ORAL_TABLET | Freq: Two times a day (BID) | ORAL | Status: DC
  Administered 2017-08-18 – 2017-08-22 (×9): 600 mg via ORAL
  Filled 2017-08-18 (×9): qty 1

## 2017-08-18 MED ORDER — ALLOPURINOL 300 MG PO TABS
300.0000 mg | ORAL_TABLET | Freq: Every day | ORAL | Status: DC
  Administered 2017-08-18 – 2017-08-22 (×5): 300 mg via ORAL
  Filled 2017-08-18 (×5): qty 1

## 2017-08-18 NOTE — Plan of Care (Signed)
  Problem: Clinical Measurements: Goal: Ability to maintain clinical measurements within normal limits will improve Outcome: Progressing Goal: Will remain free from infection Outcome: Progressing Goal: Diagnostic test results will improve Outcome: Progressing   Problem: Activity: Goal: Risk for activity intolerance will decrease Outcome: Progressing Note:  Ambulated in hallway with walker standby assistance. Patient tolerated well Taken off of oxygen for walk and sat 97% no shortness of breath remains off after ambulating.   Problem: Nutrition: Goal: Adequate nutrition will be maintained Outcome: Progressing   Problem: Safety: Goal: Ability to remain free from injury will improve Outcome: Progressing

## 2017-08-18 NOTE — Progress Notes (Signed)
PROGRESS NOTE    Carmen Fisher  OIT:[id number] DOB: [birth year] DOA: 08/16/2017 PCP: Deland Pretty, MD   Brief Narrative:  77 year old female with history of gout, glaucoma, essential hypertension, multiple myeloma-not on any medications, osteoarthritis came to the hospital for evaluation of fever, change in mental status, nausea and vomiting.  Upon admission her UA was positive for urinary tract infection, CT of the head was negative, CT of the abdomen showed 12 mm stone in the right UPJ with hydronephrosis.  Patient was initially taken to the ICU for closer monitoring.  Urology was consulted who performed cystoscopy with stent placement on 08/16/2017.  Initially patient was started on IV Rocephin.  Over the course of 24 hours as she did better in the ICU she was transferred out to telemetry for further care and monitoring.   Assessment & Plan:   Principal Problem:   Acute pyelonephritis Active Problems:   Multiple myeloma (HCC)   Altered mental status   SIRS (systemic inflammatory response syndrome) (HCC)   Sepsis secondary to urinary tract infection with hematuria Obstructive uropathy with right-sided hydronephrosis - Urine culture is positive for Klebsiella pneumonia.  This is sensitive to IV Rocephin therefore will continue this - Status post OP Day 2 for cystoscopy and stent placement.  She will make outpatient follow-up arrangements - Continue gentle hydration, provide supportive care, pain control and antiemetics  History of multiple myeloma, not on any medication -According to patient this is a remission.  She follows with Dr. Earlie Server at [facility].  Next follow-up appointment coming up in about 3-4 weeks.  History of GERD -PPI   Peripheral neuropathy -Continue home regimen of gabapentin.  DVT prophylaxis: Lovenox  Code Status: Full code Family Communication: Granddaughter at bedside Disposition Plan: Discharge next 24 hours  Consultants:    None  Procedures:   None   Antimicrobials:   Rocephin   Subjective: No complaints today.  Feeling better.  Lives at home alone independently.  Review of Systems Otherwise negative except as per HPI, including: General: Denies fever, chills, night sweats or unintended weight loss. Resp: Denies cough, wheezing, shortness of breath. Cardiac: Denies chest pain, palpitations, orthopnea, paroxysmal nocturnal dyspnea. GI: Denies abdominal pain, nausea, vomiting, diarrhea or constipation GU: Denies dysuria, frequency, hesitancy or incontinence MS: Denies muscle aches, joint pain or swelling Neuro: Denies headache, neurologic deficits (focal weakness, numbness, tingling), abnormal gait Psych: Denies anxiety, depression, SI/HI/AVH Skin: Denies new rashes or lesions ID: Denies sick contacts, exotic exposures, travel  Objective: Vitals:   08/17/17 1636 08/17/17 2113 08/17/17 2200 08/18/17 0543  BP: 123/68 129/62  (!) 143/71  Pulse: (!) 105 (!) 108  (!) 104  Resp: 18 19  19   Temp: 98.9 F (37.2 C) 99.4 F (37.4 C)  98.5 F (36.9 C)  TempSrc: Oral     SpO2: 100% 93% 97% 94%  Weight:      Height:        Intake/Output Summary (Last 24 hours) at 08/18/2017 1143 Last data filed at 08/18/2017 0842 Gross per 24 hour  Intake 0 ml  Output 3725 ml  Net -3725 ml   Filed Weights   08/16/17 1146 08/16/17 2100 08/17/17 0600  Weight: 72.6 kg (160 lb) 78.5 kg (173 lb 1 oz) 78.8 kg (173 lb 11.6 oz)    Examination:  General exam: Appears calm and comfortable  Respiratory system: Clear to auscultation. Respiratory effort normal. Cardiovascular system: S1 & S2 heard, RRR. No JVD, murmurs, rubs, gallops or clicks. No  pedal edema. Gastrointestinal system: Abdomen is nondistended, soft and nontender. No organomegaly or masses felt. Normal bowel sounds heard. Central nervous system: Alert and oriented. No focal neurological deficits. Extremities: Symmetric 5 x 5 power. Skin: No rashes,  lesions or ulcers Psychiatry: Judgement and insight appear normal. Mood & affect appropriate.     Data Reviewed:   CBC: Recent Labs  Lab 08/16/17 1220 08/17/17 0326 08/18/17 0506  WBC 3.7* 18.3* 18.5*  NEUTROABS 3.4  --   --   HGB 12.8 9.7* 9.7*  HCT 39.8 30.8* 29.8*  MCV 100.5* 103.4* 99.0  PLT 133* 90* 86*   Basic Metabolic Panel: Recent Labs  Lab 08/16/17 1415 08/17/17 0326 08/18/17 0506  NA 141 143 141  K 3.0* 3.6 3.2*  CL 108 112* 109  CO2 20* 19* 20*  GLUCOSE 111* 113* 102*  BUN 14 15 10   CREATININE 1.79* 1.47* 1.02*  CALCIUM 8.0* 7.0* 7.4*  MG  --  1.1* 2.4  PHOS  --  3.3 1.9*   GFR: Estimated Creatinine Clearance: 45.3 mL/min (A) (by C-G formula based on SCr of 1.02 mg/dL (H)). Liver Function Tests: Recent Labs  Lab 08/16/17 1415  AST 33  ALT 18  ALKPHOS 50  BILITOT 1.9*  PROT 6.2*  ALBUMIN 3.2*   Recent Labs  Lab 08/16/17 1415  LIPASE 29   No results for input(s): AMMONIA in the last 168 hours. Coagulation Profile: Recent Labs  Lab 08/16/17 1415  INR 1.10   Cardiac Enzymes: No results for input(s): CKTOTAL, CKMB, CKMBINDEX, TROPONINI in the last 168 hours. BNP (last 3 results) No results for input(s): PROBNP in the last 8760 hours. HbA1C: No results for input(s): HGBA1C in the last 72 hours. CBG: Recent Labs  Lab 08/16/17 2023 08/17/17 0116  GLUCAP 78 95   Lipid Profile: No results for input(s): CHOL, HDL, LDLCALC, TRIG, CHOLHDL, LDLDIRECT in the last 72 hours. Thyroid Function Tests: No results for input(s): TSH, T4TOTAL, FREET4, T3FREE, THYROIDAB in the last 72 hours. Anemia Panel: No results for input(s): VITAMINB12, FOLATE, FERRITIN, TIBC, IRON, RETICCTPCT in the last 72 hours. Sepsis Labs: Recent Labs  Lab 08/16/17 1229 08/16/17 1415 08/16/17 1426 08/16/17 1659 08/17/17 0024  PROCALCITON  --  55.33  --   --   --   LATICACIDVEN 5.16*  --  6.34* 5.82* 3.6*    Recent Results (from the past 240 hour(s))  Blood  Culture (routine x 2)     Status: None (Preliminary result)   Collection Time: 08/16/17 12:10 PM  Result Value Ref Range Status   Specimen Description BLOOD LEFT HAND  Final   Special Requests   Final    BOTTLES DRAWN AEROBIC ONLY Blood Culture results may not be optimal due to an inadequate volume of blood received in culture bottles Performed at [facility] [address], [place], [place] [address]    Culture  Setup Time   Final    GRAM NEGATIVE RODS AEROBIC BOTTLE ONLY CRITICAL VALUE NOTED.  VALUE IS CONSISTENT WITH PREVIOUSLY REPORTED AND CALLED VALUE.    Culture GRAM NEGATIVE RODS  Final   Report Status PENDING  Incomplete  Blood Culture (routine x 2)     Status: Abnormal (Preliminary result)   Collection Time: 08/16/17 12:20 PM  Result Value Ref Range Status   Specimen Description BLOOD LEFT ANTECUBITAL  Final   Special Requests   Final    BOTTLES DRAWN AEROBIC AND ANAEROBIC Blood Culture adequate volume   Culture  Setup Time   Final    GRAM NEGATIVE RODS IN BOTH AEROBIC AND ANAEROBIC BOTTLES CRITICAL RESULT CALLED TO, READ BACK BY AND VERIFIED WITH: L. SEAY,PHARMD 0126 08/17/2017 T. TYSOR    Culture (A)  Final    KLEBSIELLA PNEUMONIAE SUSCEPTIBILITIES TO FOLLOW Performed at [facility], [address] [place], [place] [address]    Report Status PENDING  Incomplete  Blood Culture ID Panel (Reflexed)     Status: Abnormal   Collection Time: 08/16/17 12:20 PM  Result Value Ref Range Status   Enterococcus species NOT DETECTED NOT DETECTED Final   Listeria monocytogenes NOT DETECTED NOT DETECTED Final   Staphylococcus species NOT DETECTED NOT DETECTED Final   Staphylococcus aureus NOT DETECTED NOT DETECTED Final   Streptococcus species NOT DETECTED NOT DETECTED Final   Streptococcus agalactiae NOT DETECTED NOT DETECTED Final   Streptococcus pneumoniae NOT DETECTED NOT DETECTED Final   Streptococcus pyogenes NOT DETECTED NOT DETECTED Final   Acinetobacter  baumannii NOT DETECTED NOT DETECTED Final   Enterobacteriaceae species DETECTED (A) NOT DETECTED Final    Comment: Enterobacteriaceae represent a large family of gram-negative bacteria, not a single organism. CRITICAL RESULT CALLED TO, READ BACK BY AND VERIFIED WITH: L. SEAY,PHARMD 0126 08/17/2017 T. TYSOR    Enterobacter cloacae complex NOT DETECTED NOT DETECTED Final   Escherichia coli NOT DETECTED NOT DETECTED Final   Klebsiella oxytoca NOT DETECTED NOT DETECTED Final   Klebsiella pneumoniae DETECTED (A) NOT DETECTED Final    Comment: CRITICAL RESULT CALLED TO, READ BACK BY AND VERIFIED WITH: L. SEAY,PHARMD 0126 08/17/2017 T. TYSOR    Proteus species NOT DETECTED NOT DETECTED Final   Serratia marcescens NOT DETECTED NOT DETECTED Final   Carbapenem resistance NOT DETECTED NOT DETECTED Final   Haemophilus influenzae NOT DETECTED NOT DETECTED Final   Neisseria meningitidis NOT DETECTED NOT DETECTED Final   Pseudomonas aeruginosa NOT DETECTED NOT DETECTED Final   Candida albicans NOT DETECTED NOT DETECTED Final   Candida glabrata NOT DETECTED NOT DETECTED Final   Candida krusei NOT DETECTED NOT DETECTED Final   Candida parapsilosis NOT DETECTED NOT DETECTED Final   Candida tropicalis NOT DETECTED NOT DETECTED Final  Urine culture     Status: Abnormal   Collection Time: 08/16/17 12:50 PM  Result Value Ref Range Status   Specimen Description URINE, CATHETERIZED  Final   Special Requests   Final    NONE Performed at [facility], [address] [place], [place] [address]    Culture >=100,000 COLONIES/mL KLEBSIELLA PNEUMONIAE (A)  Final   Report Status 08/18/2017 FINAL  Final   Organism ID, Bacteria KLEBSIELLA PNEUMONIAE (A)  Final      Susceptibility   Klebsiella pneumoniae - MIC*    AMPICILLIN RESISTANT Resistant     CEFAZOLIN <=4 SENSITIVE Sensitive     CEFTRIAXONE <=1 SENSITIVE Sensitive     CIPROFLOXACIN <=0.25 SENSITIVE Sensitive     GENTAMICIN <=1 SENSITIVE Sensitive      IMIPENEM <=0.25 SENSITIVE Sensitive     NITROFURANTOIN 32 SENSITIVE Sensitive     TRIMETH/SULFA <=20 SENSITIVE Sensitive     AMPICILLIN/SULBACTAM <=2 SENSITIVE Sensitive     PIP/TAZO <=4 SENSITIVE Sensitive     Extended ESBL NEGATIVE Sensitive     * >=100,000 COLONIES/mL KLEBSIELLA PNEUMONIAE  Anaerobic culture     Status: None (Preliminary result)   Collection Time: 08/16/17  6:09 PM  Result Value Ref Range Status   Specimen Description CYSTOSCOPY RIGHT RENAL PELVIS  Final   Special Requests SPEC A  Final   Gram Stain   Final    ABUNDANT WBC PRESENT, PREDOMINANTLY PMN ABUNDANT GRAM NEGATIVE RODS Performed at [facility], [place] [address] [place], [place] [address]    Culture PENDING  Incomplete   Report Status PENDING  Incomplete  Urine Culture     Status: Abnormal   Collection Time: 08/16/17  6:09 PM  Result Value Ref Range Status   Specimen Description CYSTOSCOPY RIGHT RENAL PELVIS  Final   Special Requests SPEC A  Final   Culture >=100,000 COLONIES/mL KLEBSIELLA PNEUMONIAE (A)  Final   Report Status 08/18/2017 FINAL  Final   Organism ID, Bacteria KLEBSIELLA PNEUMONIAE (A)  Final      Susceptibility   Klebsiella pneumoniae - MIC*    AMPICILLIN RESISTANT Resistant     CEFAZOLIN <=4 SENSITIVE Sensitive     CEFEPIME <=1 SENSITIVE Sensitive     CEFTAZIDIME <=1 SENSITIVE Sensitive     CEFTRIAXONE <=1 SENSITIVE Sensitive     CIPROFLOXACIN <=0.25 SENSITIVE Sensitive     GENTAMICIN <=1 SENSITIVE Sensitive     IMIPENEM <=0.25 SENSITIVE Sensitive     TRIMETH/SULFA <=20 SENSITIVE Sensitive     AMPICILLIN/SULBACTAM 4 SENSITIVE Sensitive     PIP/TAZO <=4 SENSITIVE Sensitive     Extended ESBL NEGATIVE Sensitive     * >=100,000 COLONIES/mL KLEBSIELLA PNEUMONIAE  MRSA PCR Screening     Status: None   Collection Time: 08/16/17  8:49 PM  Result Value Ref Range Status   MRSA by PCR NEGATIVE NEGATIVE Final    Comment:        The GeneXpert MRSA Assay (FDA approved for NASAL  specimens only), is one component of a comprehensive MRSA colonization surveillance program. It is not intended to diagnose MRSA infection nor to guide or monitor treatment for MRSA infections. Performed at [facility], [place] [address] [place], [place] [address]          Radiology Studies: Ct Abdomen Pelvis Wo Contrast  Result Date: 08/16/2017 CLINICAL DATA:  Abdominal distention, nausea, vomiting EXAM: CT ABDOMEN AND PELVIS WITHOUT CONTRAST TECHNIQUE: Multidetector CT imaging of the abdomen and pelvis was performed following the standard protocol without IV contrast. COMPARISON:  Ultrasound 12/04/2016 FINDINGS: Lower chest: Bibasilar atelectasis.  Heart is normal size. Hepatobiliary: 2.2 cm low-density lesion in the right hepatic lobe peripherally likely corresponds to a cyst seen on prior ultrasound. Gallbladder grossly unremarkable. No biliary ductal dilatation. Pancreas: No focal abnormality or ductal dilatation. Spleen: No focal abnormality.  Normal size. Adrenals/Urinary Tract: Extensive bilateral perinephric stranding, right greater than left. Mild right hydronephrosis. 12 mm stone in the region of the right UPJ. Calcifications noted inferiorly could reflect small ureteral stones. The ureter is difficult to visualize and follow due to the extensive perinephric stranding which extends inferiorly around the ureter. No stones or hydronephrosis on the left. Adrenal glands and urinary bladder unremarkable. Small amount of gas in the urinary bladder presumably from catheterization. Stomach/Bowel: Stomach, large and small bowel grossly unremarkable. Vascular/Lymphatic: Aortic atherosclerosis. No enlarged abdominal or pelvic lymph nodes. Reproductive: Prior hysterectomy.  No adnexal masses. Other: No free fluid or free air. Musculoskeletal: No acute bony abnormality. IMPRESSION: Extensive bilateral perinephric stranding, right greater than left. There is mild right hydronephrosis with 12 mm  right UPJ stone. Other calcifications noted inferiorly could be ureteral stones on the right but difficult to follow the right ureter with the extensive perinephric and peri ureteral stranding. Aortic  atherosclerosis. Bibasilar atelectasis. Electronically Signed   By: Rolm Baptise [name]   On: 08/16/2017 16:47   Ct Head Wo Contrast  Result Date: 08/16/2017 CLINICAL DATA:  77 year old female with a history of nausea and vomiting EXAM: CT HEAD WITHOUT CONTRAST TECHNIQUE: Contiguous axial images were obtained from the base of the skull through the vertex without intravenous contrast. COMPARISON:  None. FINDINGS: Brain: No acute intracranial hemorrhage. No midline shift or mass effect. Gray-white differentiation maintained. Focal hypodensity lateral to the right caudate. Unremarkable appearance of the ventricular system. Vascular: Unremarkable. Skull: No acute fracture.  No aggressive bone lesion identified. Sinuses/Orbits: Unremarkable appearance of the orbits. Trace mucosal disease of the left frontal sinus. Other: None IMPRESSION: Negative for acute intracranial abnormality. Focal hypodensity lateral to the right caudate may represent dilated perivascular space or prior lacunar infarction. Electronically Signed   By: Corrie Mckusick D.O.   On: 08/16/2017 14:25   Dg Cystogram  Result Date: 08/18/2017 CLINICAL DATA:  Hydronephrosis with right ureteral calculus EXAM: RETROGRADE PYELOGRAM FROM OR FLUOROSCOPY TIME:  Fluoroscopy Time: 0 minutes 22 seconds Number of Acquired Spot Images: 1 COMPARISON:  CT abdomen and pelvis Aug 16, 2017 FINDINGS: Spot image of the right kidney shows moderate hydronephrosis on the right with filling defect in the right renal pelvis, possibly a degree of hemorrhage. A double-J stent extends into this area. No calculus is evident on submitted image. IMPRESSION: Double-J stent extending in the right renal pelvis. Fullness of the calices on the right noted. Lucency in the right renal pelvis  potentially could represent a degree of hemorrhage. Electronically Signed   By: Lowella Grip III [name]   On: 08/18/2017 07:41   Dg Chest Port 1 View  Result Date: 08/16/2017 CLINICAL DATA:  77 year old female with a history of shortness of breath and fever EXAM: PORTABLE CHEST 1 VIEW COMPARISON:  03/21/2010, 07/31/2009 FINDINGS: Cardiomediastinal silhouette accentuated by low lung volumes. Linear opacities at the lung bases. Right rotation somewhat limits evaluation. Accentuated mediastinal borders. No pneumothorax or pleural effusion. No large confluent airspace disease. No displaced fracture IMPRESSION: The accentuated heart borders are favored to represent low lung volumes and right rotation, however, mediastinal adenopathy is not excluded on this plain film. A formal PA and lateral chest x-ray may be useful. Likely atelectasis/consolidation at the lung bases. Electronically Signed   By: Corrie Mckusick D.O.   On: 08/16/2017 13:28        Scheduled Meds: . [START ON 08/19/2017] enoxaparin (LOVENOX) injection  40 mg Subcutaneous Q24H   Continuous Infusions: . sodium chloride    . cefTRIAXone (ROCEPHIN)  IV Stopped (08/18/17 0925)  . potassium chloride 10 mEq (08/18/17 1028)     LOS: 2 days    I have spent 35 minutes face to face with the patient and on the ward discussing the patients care, assessment, plan and disposition with other care givers. >50% of the time was devoted counseling the patient about the risks and benefits of treatment and coordinating care.     Ankit Arsenio Loader, MD [facility] Pager [phone]   If 7PM-7AM, please contact night-coverage [email or web address] Password [facility] 08/18/2017, 11:43 AM

## 2017-08-18 NOTE — Telephone Encounter (Signed)
Daughter called. Pt in hospital with infection. (urinary obstruction ,stent , blood infection). Asking if Julien Nordmann took her off Revlimid. I left message that he did take her off Revlimid due to diarrhea. She is under observation. Next labs June 6th-I told dtr to keep June appts and notify  Julien Nordmann if we need to r/s

## 2017-08-18 NOTE — Evaluation (Signed)
Physical Therapy Evaluation Patient Details Name: Carmen Fisher MRN: [id number] DOB: 1940/12/08 Today's Date: 08/18/2017   History of Present Illness  77 yo F with PMHx HTN, multiple myeloma here with weakness/confusion. Found to have sepsis.  Clinical Impression  Orders received for PT evaluation. Patient demonstrates deficits in functional mobility as indicated below. Will benefit from continued skilled PT to address deficits and maximize function. Will see as indicated and progress as tolerated.  Anticipate patient will progress well, encouraged continued mobility with staff. Currently recommending HHPT but may progress to independence prior to discharge pending length of stay.     Follow Up Recommendations Home health PT;Supervision for mobility/OOB(vs no follow up pending progression)    Equipment Recommendations  None recommended by PT    Recommendations for Other Services       Precautions / Restrictions Precautions Precautions: Fall      Mobility  Bed Mobility Overal bed mobility: Modified Independent             General bed mobility comments: increased time to perform, no physical assist required  Transfers Overall transfer level: Needs assistance   Transfers: Sit to/from Stand Sit to Stand: Min guard         General transfer comment: min guard for safetya nd stability, modest posterior instability noted  Ambulation/Gait Ambulation/Gait assistance: Min assist Ambulation Distance (Feet): 160 Feet(10 ft HHA min assist ) Assistive device: Rolling walker (2 wheeled) Gait Pattern/deviations: Step-through pattern;Decreased stride length;Drifts right/left;Narrow base of support Gait velocity: decreased Gait velocity interpretation: <1.8 ft/sec, indicate of risk for recurrent falls General Gait Details: patient with noted instability and decreased gait speed, VCs to increase cadence and provided patient with RW for balance.  Stairs             Wheelchair Mobility    Modified Rankin (Stroke Patients Only)       Balance Overall balance assessment: Mild deficits observed, not formally tested                                           Pertinent Vitals/Pain Pain Assessment: No/denies pain    Home Living Family/patient expects to be discharged to:: Private residence Living Arrangements: Children Available Help at Discharge: Family;Available PRN/intermittently Type of Home: House Home Access: Level entry     Home Layout: One level Home Equipment: None      Prior Function Level of Independence: Independent               Hand Dominance   Dominant Hand: Right    Extremity/Trunk Assessment   Upper Extremity Assessment Upper Extremity Assessment: Overall WFL for tasks assessed    Lower Extremity Assessment Lower Extremity Assessment: Generalized weakness(Right modestly weaker than left LE)       Communication   Communication: No difficulties  Cognition Arousal/Alertness: Awake/alert Behavior During Therapy: WFL for tasks assessed/performed Overall Cognitive Status: Within Functional Limits for tasks assessed                                        General Comments      Exercises     Assessment/Plan    PT Assessment Patient needs continued PT services  PT Problem List Decreased strength;Decreased activity tolerance;Decreased balance;Decreased mobility       PT Treatment  Interventions DME instruction;Gait training;Stair training;Functional mobility training;Therapeutic activities;Therapeutic exercise;Patient/family education    PT Goals (Current goals can be found in the Care Plan section)  Acute Rehab PT Goals Patient Stated Goal: to go home PT Goal Formulation: With patient Time For Goal Achievement: 09/01/17 Potential to Achieve Goals: Good    Frequency Min 3X/week   Barriers to discharge        Co-evaluation               AM-PAC PT "6  Clicks" Daily Activity  Outcome Measure Difficulty turning over in bed (including adjusting bedclothes, sheets and blankets)?: A Little Difficulty moving from lying on back to sitting on the side of the bed? : A Little Difficulty sitting down on and standing up from a chair with arms (e.g., wheelchair, bedside commode, etc,.)?: Unable Help needed moving to and from a bed to chair (including a wheelchair)?: A Little Help needed walking in hospital room?: A Little Help needed climbing 3-5 steps with a railing? : A Little 6 Click Score: 16    End of Session Equipment Utilized During Treatment: Gait belt Activity Tolerance: Patient tolerated treatment well;Patient limited by fatigue Patient left: in bed;with call bell/phone within reach;with bed alarm set;with family/visitor present Nurse Communication: Mobility status PT Visit Diagnosis: Unsteadiness on feet (R26.81)    Time: 4739-5844 PT Time Calculation (min) (ACUTE ONLY): 17 min   Charges:   PT Evaluation $PT Eval Moderate Complexity: 1 Mod     PT G Codes:        Alben Deeds, PT DPT  Board Certified Neurologic Specialist [place] 08/18/2017, 3:14 PM

## 2017-08-19 LAB — CULTURE, BLOOD (ROUTINE X 2): Special Requests: ADEQUATE

## 2017-08-19 LAB — BASIC METABOLIC PANEL
Anion gap: 9 (ref 5–15)
BUN: 6 mg/dL (ref 6–20)
CO2: 23 mmol/L (ref 22–32)
Calcium: 8.7 mg/dL — ABNORMAL LOW (ref 8.9–10.3)
Chloride: 110 mmol/L (ref 101–111)
Creatinine, Ser: 0.83 mg/dL (ref 0.44–1.00)
GFR calc Af Amer: >60 mL/min (ref >60)
GFR calc non Af Amer: >60 mL/min (ref >60)
Glucose, Bld: 106 mg/dL — ABNORMAL HIGH (ref 65–99)
Potassium: 3.2 mmol/L — ABNORMAL LOW (ref 3.5–5.1)
Sodium: 142 mmol/L (ref 135–145)

## 2017-08-19 LAB — CBC
HCT: 33 % — ABNORMAL LOW (ref 36.0–46.0)
Hemoglobin: 10.6 g/dL — ABNORMAL LOW (ref 12.0–15.0)
MCH: 31.5 pg (ref 26.0–34.0)
MCHC: 32.1 g/dL (ref 30.0–36.0)
MCV: 98.2 fL (ref 78.0–100.0)
Platelets: 109 10*3/uL — ABNORMAL LOW (ref 150–400)
RBC: 3.36 MIL/uL — ABNORMAL LOW (ref 3.87–5.11)
RDW: 14 % (ref 11.5–15.5)
WBC: 22.1 10*3/uL — ABNORMAL HIGH (ref 4.0–10.5)

## 2017-08-19 LAB — MAGNESIUM: Magnesium: 1.9 mg/dL (ref 1.7–2.4)

## 2017-08-19 MED ORDER — POTASSIUM CHLORIDE CRYS ER 20 MEQ PO TBCR
40.0000 meq | EXTENDED_RELEASE_TABLET | Freq: Once | ORAL | Status: AC
  Administered 2017-08-19: 40 meq via ORAL
  Filled 2017-08-19: qty 2

## 2017-08-19 MED ORDER — DOCUSATE SODIUM 100 MG PO CAPS
100.0000 mg | ORAL_CAPSULE | Freq: Two times a day (BID) | ORAL | Status: DC | PRN
  Filled 2017-08-19: qty 1

## 2017-08-19 MED ORDER — POLYETHYLENE GLYCOL 3350 17 G PO PACK
17.0000 g | PACK | Freq: Every day | ORAL | Status: DC
  Administered 2017-08-19: 17 g via ORAL
  Filled 2017-08-19 (×4): qty 1

## 2017-08-19 MED ORDER — SENNA 8.6 MG PO TABS: 1.0000 | ORAL_TABLET | Freq: Every day | ORAL | Status: DC | PRN

## 2017-08-19 NOTE — Progress Notes (Signed)
PROGRESS NOTE    Carmen Fisher  EPP:[id number] DOB: [birth year] DOA: 08/16/2017 PCP: Deland Pretty, MD   Brief Narrative:  77 year old female with history of gout, glaucoma, essential hypertension, multiple myeloma-not on any medications, osteoarthritis came to the hospital for evaluation of fever, change in mental status, nausea and vomiting.  Upon admission her UA was positive for urinary tract infection, CT of the head was negative, CT of the abdomen showed 12 mm stone in the right UPJ with hydronephrosis.  Patient was initially taken to the ICU for closer monitoring.  Urology was consulted who performed cystoscopy with stent placement on 08/16/2017.  Initially patient was started on IV Rocephin.  Over the course of 24 hours as she did better in the ICU she was transferred out to telemetry for further care and monitoring. Patient's urine culture ended up growing Klebsiella which was sensitive to Rocephin.  Her blood culture was also positive for Klebsiella.  On 5/20 patient spiked fever therefore she had to be recultured.  Assessment & Plan:   Principal Problem:   Acute pyelonephritis Active Problems:   Multiple myeloma (HCC)   Altered mental status   SIRS (systemic inflammatory response syndrome) (HCC)   Sepsis secondary to urinary tract infection with hematuria Bacteremia due to Klebsiella pneumonia Obstructive uropathy with right-sided hydronephrosis - Urine culture is positive for Klebsiella pneumonia.  This is sensitive to IV Rocephin therefore will continue this.  Repeat cultures have been ordered due to fevers overnight.  Eventually she will need about 2 weeks of antibiotics due to bacteremia. WBC has trended up to 22.1 - Status post OP Day 3 for cystoscopy and stent placement.  She will make outpatient follow-up arrangements - Continue gentle hydration, provide supportive care, pain control and antiemetics -She will eventually be going home with Foley catheter, will need to  follow-up with outpatient urology.  Follow-up with [facility] in 2 weeks.  History of multiple myeloma, not on any medication -According to patient this is a remission.  She follows with Dr. Earlie Server at [facility].  Next follow-up appointment coming up in about 3-4 weeks.  History of GERD -PPI   Peripheral neuropathy -Continue home regimen of gabapentin.  DVT prophylaxis: Lovenox  Code Status: Full code Family Communication: Granddaughter and sister at bedside Disposition Plan: If remains afebrile she is to be discharged in next 24 hours  Consultants:   None  Procedures:   None   Antimicrobials:   Rocephin   Subjective: Overnight patient had fevers but no other complaints this morning.  Review of Systems Otherwise negative except as per HPI, including: General = no  chills, dizziness, malaise, fatigue HEENT/EYES = negative for pain, redness, loss of vision, double vision, blurred vision, loss of hearing, sore throat, hoarseness, dysphagia Cardiovascular= negative for chest pain, palpitation, murmurs, lower extremity swelling Respiratory/lungs= negative for shortness of breath, cough, hemoptysis, wheezing, mucus production Gastrointestinal= negative for nausea, vomiting,, abdominal pain, melena, hematemesis Genitourinary= negative for Dysuria, Hematuria, Change in Urinary Frequency MSK = Negative for arthralgia, myalgias, Back Pain, Joint swelling  Neurology= Negative for headache, seizures, numbness, tingling  Psychiatry= Negative for anxiety, depression, suicidal and homocidal ideation Allergy/Immunology= Medication/Food allergy as listed  Skin= Negative for Rash, lesions, ulcers, itching    Objective: Vitals:   08/18/17 1615 08/18/17 2103 08/19/17 0453 08/19/17 1015  BP: (!) 144/77 (!) 150/79 (!) 147/87   Pulse: 95 100 (!) 103   Resp: 18 18 19    Temp: 98.3 F (36.8 C) 100.1 F (  37.8 C) 100.3 F (37.9 C)   TempSrc:  Oral Oral   SpO2: 94% 96%  93%   Weight:    73.6 kg (162 lb 3.2 oz)  Height:        Intake/Output Summary (Last 24 hours) at 08/19/2017 1303 Last data filed at 08/19/2017 0845 Gross per 24 hour  Intake 340 ml  Output 3650 ml  Net -3310 ml   Filed Weights   08/16/17 2100 08/17/17 0600 08/19/17 1015  Weight: 78.5 kg (173 lb 1 oz) 78.8 kg (173 lb 11.6 oz) 73.6 kg (162 lb 3.2 oz)    Examination: Constitutional: NAD, calm, comfortable Eyes: PERRL, lids and conjunctivae normal ENMT: Mucous membranes are moist. Posterior pharynx clear of any exudate or lesions.Normal dentition.  Neck: normal, supple, no masses, no thyromegaly Respiratory: clear to auscultation bilaterally, no wheezing, no crackles. Normal respiratory effort. No accessory muscle use.  Cardiovascular: Regular rate and rhythm, no murmurs / rubs / gallops. No extremity edema. 2+ pedal pulses. No carotid bruits.  Abdomen: no tenderness, no masses palpated. No hepatosplenomegaly. Bowel sounds positive.  Musculoskeletal: no clubbing / cyanosis. No joint deformity upper and lower extremities. Good ROM, no contractures. Normal muscle tone.  Skin: no rashes, lesions, ulcers. No induration Neurologic: CN 2-12 grossly intact. Sensation intact, DTR normal. Strength 5/5 in all 4.  Psychiatric: Normal judgment and insight. Alert and oriented x 3. Normal mood.  Foley catheter is in place.   Data Reviewed:   CBC: Recent Labs  Lab 08/16/17 1220 08/17/17 0326 08/18/17 0506 08/19/17 0817  WBC 3.7* 18.3* 18.5* 22.1*  NEUTROABS 3.4  --   --   --   HGB 12.8 9.7* 9.7* 10.6*  HCT 39.8 30.8* 29.8* 33.0*  MCV 100.5* 103.4* 99.0 98.2  PLT 133* 90* 86* 413*   Basic Metabolic Panel: Recent Labs  Lab 08/16/17 1415 08/17/17 0326 08/18/17 0506 08/19/17 0817  NA 141 143 141 142  K 3.0* 3.6 3.2* 3.2*  CL 108 112* 109 110  CO2 20* 19* 20* 23  GLUCOSE 111* 113* 102* 106*  BUN 14 15 10 6   CREATININE 1.79* 1.47* 1.02* 0.83  CALCIUM 8.0* 7.0* 7.4* 8.7*  MG  --   1.1* 2.4 1.9  PHOS  --  3.3 1.9*  --    GFR: Estimated Creatinine Clearance: 53.7 mL/min (by C-G formula based on SCr of 0.83 mg/dL). Liver Function Tests: Recent Labs  Lab 08/16/17 1415  AST 33  ALT 18  ALKPHOS 50  BILITOT 1.9*  PROT 6.2*  ALBUMIN 3.2*   Recent Labs  Lab 08/16/17 1415  LIPASE 29   No results for input(s): AMMONIA in the last 168 hours. Coagulation Profile: Recent Labs  Lab 08/16/17 1415  INR 1.10   Cardiac Enzymes: No results for input(s): CKTOTAL, CKMB, CKMBINDEX, TROPONINI in the last 168 hours. BNP (last 3 results) No results for input(s): PROBNP in the last 8760 hours. HbA1C: No results for input(s): HGBA1C in the last 72 hours. CBG: Recent Labs  Lab 08/16/17 2023 08/17/17 0116  GLUCAP 78 95   Lipid Profile: No results for input(s): CHOL, HDL, LDLCALC, TRIG, CHOLHDL, LDLDIRECT in the last 72 hours. Thyroid Function Tests: No results for input(s): TSH, T4TOTAL, FREET4, T3FREE, THYROIDAB in the last 72 hours. Anemia Panel: No results for input(s): VITAMINB12, FOLATE, FERRITIN, TIBC, IRON, RETICCTPCT in the last 72 hours. Sepsis Labs: Recent Labs  Lab 08/16/17 1229 08/16/17 1415 08/16/17 1426 08/16/17 1659 08/17/17 0024  PROCALCITON  --  55.33  --   --   --   LATICACIDVEN 5.16*  --  6.34* 5.82* 3.6*    Recent Results (from the past 240 hour(s))  Blood Culture (routine x 2)     Status: Abnormal   Collection Time: 08/16/17 12:10 PM  Result Value Ref Range Status   Specimen Description BLOOD LEFT HAND  Final   Special Requests   Final    BOTTLES DRAWN AEROBIC ONLY Blood Culture results may not be optimal due to an inadequate volume of blood received in culture bottles   Culture  Setup Time   Final    GRAM NEGATIVE RODS AEROBIC BOTTLE ONLY CRITICAL VALUE NOTED.  VALUE IS CONSISTENT WITH PREVIOUSLY REPORTED AND CALLED VALUE.    Culture (A)  Final    KLEBSIELLA PNEUMONIAE SUSCEPTIBILITIES PERFORMED ON PREVIOUS CULTURE WITHIN THE  LAST 5 DAYS. Performed at [facility], [place] [address], [place], [place] [address]    Report Status 08/19/2017 FINAL  Final  Blood Culture (routine x 2)     Status: Abnormal   Collection Time: 08/16/17 12:20 PM  Result Value Ref Range Status   Specimen Description BLOOD LEFT ANTECUBITAL  Final   Special Requests   Final    BOTTLES DRAWN AEROBIC AND ANAEROBIC Blood Culture adequate volume Performed at [facility], [place] [address], [place], [place] [address]    Culture  Setup Time   Final    GRAM NEGATIVE RODS IN BOTH AEROBIC AND ANAEROBIC BOTTLES CRITICAL RESULT CALLED TO, READ BACK BY AND VERIFIED WITH: L. SEAY,PHARMD 0126 08/17/2017 T. TYSOR    Culture KLEBSIELLA PNEUMONIAE (A)  Final   Report Status 08/19/2017 FINAL  Final   Organism ID, Bacteria KLEBSIELLA PNEUMONIAE  Final      Susceptibility   Klebsiella pneumoniae - MIC*    AMPICILLIN >=32 RESISTANT Resistant     CEFAZOLIN <=4 SENSITIVE Sensitive     CEFEPIME <=1 SENSITIVE Sensitive     CEFTAZIDIME <=1 SENSITIVE Sensitive     CEFTRIAXONE <=1 SENSITIVE Sensitive     CIPROFLOXACIN <=0.25 SENSITIVE Sensitive     GENTAMICIN <=1 SENSITIVE Sensitive     IMIPENEM <=0.25 SENSITIVE Sensitive     TRIMETH/SULFA <=20 SENSITIVE Sensitive     AMPICILLIN/SULBACTAM 4 SENSITIVE Sensitive     PIP/TAZO <=4 SENSITIVE Sensitive     Extended ESBL NEGATIVE Sensitive     * KLEBSIELLA PNEUMONIAE  Blood Culture ID Panel (Reflexed)     Status: Abnormal   Collection Time: 08/16/17 12:20 PM  Result Value Ref Range Status   Enterococcus species NOT DETECTED NOT DETECTED Final   Listeria monocytogenes NOT DETECTED NOT DETECTED Final   Staphylococcus species NOT DETECTED NOT DETECTED Final   Staphylococcus aureus NOT DETECTED NOT DETECTED Final   Streptococcus species NOT DETECTED NOT DETECTED Final   Streptococcus agalactiae NOT DETECTED NOT DETECTED Final   Streptococcus pneumoniae NOT DETECTED NOT DETECTED Final   Streptococcus  pyogenes NOT DETECTED NOT DETECTED Final   Acinetobacter baumannii NOT DETECTED NOT DETECTED Final   Enterobacteriaceae species DETECTED (A) NOT DETECTED Final    Comment: Enterobacteriaceae represent a large family of gram-negative bacteria, not a single organism. CRITICAL RESULT CALLED TO, READ BACK BY AND VERIFIED WITH: L. SEAY,PHARMD 0126 08/17/2017 T. TYSOR    Enterobacter cloacae complex NOT DETECTED NOT DETECTED Final   Escherichia coli NOT DETECTED NOT DETECTED Final   Klebsiella oxytoca NOT DETECTED NOT DETECTED Final   Klebsiella pneumoniae DETECTED (A) NOT DETECTED  Final    Comment: CRITICAL RESULT CALLED TO, READ BACK BY AND VERIFIED WITH: L. SEAY,PHARMD 0126 08/17/2017 T. TYSOR    Proteus species NOT DETECTED NOT DETECTED Final   Serratia marcescens NOT DETECTED NOT DETECTED Final   Carbapenem resistance NOT DETECTED NOT DETECTED Final   Haemophilus influenzae NOT DETECTED NOT DETECTED Final   Neisseria meningitidis NOT DETECTED NOT DETECTED Final   Pseudomonas aeruginosa NOT DETECTED NOT DETECTED Final   Candida albicans NOT DETECTED NOT DETECTED Final   Candida glabrata NOT DETECTED NOT DETECTED Final   Candida krusei NOT DETECTED NOT DETECTED Final   Candida parapsilosis NOT DETECTED NOT DETECTED Final   Candida tropicalis NOT DETECTED NOT DETECTED Final  Urine culture     Status: Abnormal   Collection Time: 08/16/17 12:50 PM  Result Value Ref Range Status   Specimen Description URINE, CATHETERIZED  Final   Special Requests   Final    NONE Performed at [facility], [place] [address], [place], [place] [address]    Culture >=100,000 COLONIES/mL KLEBSIELLA PNEUMONIAE (A)  Final   Report Status 08/18/2017 FINAL  Final   Organism ID, Bacteria KLEBSIELLA PNEUMONIAE (A)  Final      Susceptibility   Klebsiella pneumoniae - MIC*    AMPICILLIN RESISTANT Resistant     CEFAZOLIN <=4 SENSITIVE Sensitive     CEFTRIAXONE <=1 SENSITIVE Sensitive     CIPROFLOXACIN <=0.25  SENSITIVE Sensitive     GENTAMICIN <=1 SENSITIVE Sensitive     IMIPENEM <=0.25 SENSITIVE Sensitive     NITROFURANTOIN 32 SENSITIVE Sensitive     TRIMETH/SULFA <=20 SENSITIVE Sensitive     AMPICILLIN/SULBACTAM <=2 SENSITIVE Sensitive     PIP/TAZO <=4 SENSITIVE Sensitive     Extended ESBL NEGATIVE Sensitive     * >=100,000 COLONIES/mL KLEBSIELLA PNEUMONIAE  Anaerobic culture     Status: None (Preliminary result)   Collection Time: 08/16/17  6:09 PM  Result Value Ref Range Status   Specimen Description CYSTOSCOPY RIGHT RENAL PELVIS  Final   Special Requests SPEC A  Final   Gram Stain   Final    ABUNDANT WBC PRESENT, PREDOMINANTLY PMN ABUNDANT GRAM NEGATIVE RODS Performed at [facility], [address], [place], [place] [address]    Culture   Final    NO ANAEROBES ISOLATED; CULTURE IN PROGRESS FOR 5 DAYS   Report Status PENDING  Incomplete  Urine Culture     Status: Abnormal   Collection Time: 08/16/17  6:09 PM  Result Value Ref Range Status   Specimen Description CYSTOSCOPY RIGHT RENAL PELVIS  Final   Special Requests SPEC A  Final   Culture >=100,000 COLONIES/mL KLEBSIELLA PNEUMONIAE (A)  Final   Report Status 08/18/2017 FINAL  Final   Organism ID, Bacteria KLEBSIELLA PNEUMONIAE (A)  Final      Susceptibility   Klebsiella pneumoniae - MIC*    AMPICILLIN RESISTANT Resistant     CEFAZOLIN <=4 SENSITIVE Sensitive     CEFEPIME <=1 SENSITIVE Sensitive     CEFTAZIDIME <=1 SENSITIVE Sensitive     CEFTRIAXONE <=1 SENSITIVE Sensitive     CIPROFLOXACIN <=0.25 SENSITIVE Sensitive     GENTAMICIN <=1 SENSITIVE Sensitive     IMIPENEM <=0.25 SENSITIVE Sensitive     TRIMETH/SULFA <=20 SENSITIVE Sensitive     AMPICILLIN/SULBACTAM 4 SENSITIVE Sensitive     PIP/TAZO <=4 SENSITIVE Sensitive     Extended ESBL NEGATIVE Sensitive     * >=100,000 COLONIES/mL KLEBSIELLA PNEUMONIAE  MRSA PCR  Screening     Status: None   Collection Time: 08/16/17  8:49 PM  Result Value Ref Range Status    MRSA by PCR NEGATIVE NEGATIVE Final    Comment:        The GeneXpert MRSA Assay (FDA approved for NASAL specimens only), is one component of a comprehensive MRSA colonization surveillance program. It is not intended to diagnose MRSA infection nor to guide or monitor treatment for MRSA infections. Performed at [facility], [place] [address], [place], [place] [address]          Radiology Studies: No results found.      Scheduled Meds: . allopurinol  300 mg Oral Daily  . cholecalciferol  1,000 Units Oral Daily  . enoxaparin (LOVENOX) injection  40 mg Subcutaneous Q24H  . gabapentin  600 mg Oral BID  . latanoprost  1 drop Both Eyes QHS  . multivitamin with minerals  1 tablet Oral Daily  . pantoprazole  40 mg Oral Daily  . polyethylene glycol  17 g Oral Daily   Continuous Infusions: . sodium chloride    . cefTRIAXone (ROCEPHIN)  IV Stopped (08/19/17 1008)     LOS: 3 days    I have spent 25 minutes face to face with the patient and on the ward discussing the patients care, assessment, plan and disposition with other care givers. >50% of the time was devoted counseling the patient about the risks and benefits of treatment and coordinating care.     Ankit Arsenio Loader, MD [facility] Pager [phone]   If 7PM-7AM, please contact night-coverage [email or web address] Password [email or web address] 08/19/2017, 1:03 PM

## 2017-08-19 NOTE — Care Management Note (Addendum)
Case Management Note  Patient Details  Name: DANISE DEHNE MRN: [id number] Date of Birth: 05/19/1940  Subjective/Objective:   Presents with Acute Pyelonephritis, hx of gout, glaucoma, essential hypertension, multiple myeloma, osteoarthritis. Resides with family.  Patrick North (Daughter)     [phone]           TB[facility]:[id number] [facility]  Action/Plan: Transition to home with home health services when medically stable.  NCM will continue to monitor for disposition needs.   Expected Discharge Date:                  Expected Discharge Plan:     In-House Referral:     Discharge planning Services  CM Consult  Post Acute Care Choice:    Choice offered to:    patient DME Arranged:   N/A DME Agency:   N/A  HH Arranged:    PT HH Agency:    [facility] Status of Service:    completed If discussed at Long Length of Stay Meetings, dates discussed:    Additional Comments:  Sharin Mons, RN 08/19/2017, 11:18 AM

## 2017-08-20 DIAGNOSIS — A419 Sepsis, unspecified organism: Secondary | ICD-10-CM

## 2017-08-20 DIAGNOSIS — G9340 Encephalopathy, unspecified: Secondary | ICD-10-CM

## 2017-08-20 DIAGNOSIS — C9000 Multiple myeloma not having achieved remission: Secondary | ICD-10-CM

## 2017-08-20 DIAGNOSIS — R6520 Severe sepsis without septic shock: Secondary | ICD-10-CM

## 2017-08-20 DIAGNOSIS — Z419 Encounter for procedure for purposes other than remedying health state, unspecified: Secondary | ICD-10-CM

## 2017-08-20 DIAGNOSIS — N10 Acute tubulo-interstitial nephritis: Secondary | ICD-10-CM

## 2017-08-20 LAB — BASIC METABOLIC PANEL
Anion gap: 8 (ref 5–15)
BUN: 10 mg/dL (ref 6–20)
CO2: 25 mmol/L (ref 22–32)
Calcium: 8.9 mg/dL (ref 8.9–10.3)
Chloride: 110 mmol/L (ref 101–111)
Creatinine, Ser: 0.9 mg/dL (ref 0.44–1.00)
GFR calc Af Amer: >60 mL/min (ref >60)
GFR calc non Af Amer: >60 mL/min (ref >60)
Glucose, Bld: 113 mg/dL — ABNORMAL HIGH (ref 65–99)
Potassium: 3.4 mmol/L — ABNORMAL LOW (ref 3.5–5.1)
Sodium: 143 mmol/L (ref 135–145)

## 2017-08-20 LAB — CBC
HCT: 33 % — ABNORMAL LOW (ref 36.0–46.0)
Hemoglobin: 10.6 g/dL — ABNORMAL LOW (ref 12.0–15.0)
MCH: 31.2 pg (ref 26.0–34.0)
MCHC: 32.1 g/dL (ref 30.0–36.0)
MCV: 97.1 fL (ref 78.0–100.0)
Platelets: 119 10*3/uL — ABNORMAL LOW (ref 150–400)
RBC: 3.4 MIL/uL — ABNORMAL LOW (ref 3.87–5.11)
RDW: 14 % (ref 11.5–15.5)
WBC: 14.1 10*3/uL — ABNORMAL HIGH (ref 4.0–10.5)

## 2017-08-20 LAB — MAGNESIUM: Magnesium: 1.8 mg/dL (ref 1.7–2.4)

## 2017-08-20 MED ORDER — POTASSIUM CHLORIDE CRYS ER 20 MEQ PO TBCR
40.0000 meq | EXTENDED_RELEASE_TABLET | Freq: Two times a day (BID) | ORAL | Status: AC
  Administered 2017-08-20: 40 meq via ORAL
  Filled 2017-08-20: qty 2

## 2017-08-20 MED ORDER — DORZOLAMIDE HCL-TIMOLOL MAL 2-0.5 % OP SOLN
1.0000 [drp] | Freq: Two times a day (BID) | OPHTHALMIC | Status: DC
  Administered 2017-08-20 – 2017-08-21 (×3): 1 [drp] via OPHTHALMIC
  Filled 2017-08-20: qty 10

## 2017-08-20 MED ORDER — MAGNESIUM SULFATE 2 GM/50ML IV SOLN
2.0000 g | Freq: Once | INTRAVENOUS | Status: DC
  Filled 2017-08-20: qty 50

## 2017-08-20 NOTE — Progress Notes (Signed)
PROGRESS NOTE  Carmen Fisher YBW:[id number] DOB: September 13, 1940 DOA: 08/16/2017 PCP: Deland Pretty, MD  HPI/Recap of past 66 hours: 77 year old female with history of gout, glaucoma, essential hypertension, multiple myeloma-not on any medications, osteoarthritis came to the hospital for evaluation of fever, change in mental status, nausea and vomiting.  Upon admission her UA was positive for urinary tract infection, CT of the head was negative, CT of the abdomen showed 12 mm stone in the right UPJ with hydronephrosis.  Patient was initially taken to the ICU for closer monitoring.  Urology was consulted who performed cystoscopy with stent placement on 08/16/2017.  Initially patient was started on IV Rocephin.  Over the course of 24 hours as she did better in the ICU she was transferred out to telemetry for further care and monitoring. Patient's urine culture ended up growing Klebsiella which was sensitive to Rocephin.  Her blood culture was also positive for Klebsiella.  On 5/20 patient spiked fever therefore she had to be recultured.  08/20/2017: Patient seen and examined at bedside.  She has no new complaints.  Elevated temperature overnight with T-max of 100.1.  White blood cell count remains elevated but trending down.  Continue IV antibiotics and monitor fever curve.  Assessment/Plan: Principal Problem:   Acute pyelonephritis Active Problems:   Multiple myeloma (HCC)   Altered mental status   SIRS (systemic inflammatory response syndrome) (HCC)  Sepsis secondary to Klebsiella pneumonia pneumoniae UTI and Klebsiella pneumonia bacteremia T-max 100.1 overnight WBC 14 from 22 Repeat CBC in the morning Monitor fever curve Continue IV antibiotics Will need 14 days of antibiotics  Klebsiella pneumoniae bacteremia Positive blood cultures x2 drawn on 08/16/2017 Repeated blood cultures x2 drawn on 08/19/2017 in process Continue IV ceftriaxone  Obstructive uropathy with right-sided  hydronephrosis Status post cystoscopy and stent placement Indwelling Foley catheter in place [facility] Continue to monitor urine output  Sinus tachycardia Possibly related to her sepsis Continue to closely monitor Give gentle IV hydration Encourage oral fluid intake  History of multiple myeloma Not currently being treated  Hypokalemia Potassium 3.4 Repleted with oral potassium 40 mEq x 2 magnesium IV 2 g  Hypomagnesemia Magnesium 1.8 Repleted with IV magnesium 2 g once  Gout Stable continue allopurinol  GERD Stable continue Protonix p.o.    Code Status: Full code  Family Communication: Granddaughter and daughter updated  Disposition Plan: Home when clinically stable   Consultants:  None  Procedures:  none  Antimicrobials:  Iv ceftriaxone  DVT prophylaxis: Subcu Lovenox   Objective: Vitals:   08/19/17 2121 08/20/17 0456 08/20/17 0500 08/20/17 0624  BP: 139/87 (!) 141/85    Pulse: (!) 120 98    Resp: 18 17    Temp: 99.2 F (37.3 C) 100.1 F (37.8 C)  98.7 F (37.1 C)  TempSrc: Oral Oral  Oral  SpO2: 95% 97%    Weight:   74.9 kg (165 lb 2 oz)   Height:        Intake/Output Summary (Last 24 hours) at 08/20/2017 1158 Last data filed at 08/20/2017 1047 Gross per 24 hour  Intake 480 ml  Output 925 ml  Net -445 ml   Filed Weights   08/17/17 0600 08/19/17 1015 08/20/17 0500  Weight: 78.8 kg (173 lb 11.6 oz) 73.6 kg (162 lb 3.2 oz) 74.9 kg (165 lb 2 oz)    Exam:  . General: 77 y.o. year-old female well developed well nourished in no acute distress.  Alert and oriented x3.  Has Foley cath in place . Cardiovascular: Regular rate and rhythm with no rubs or gallops.  No thyromegaly or JVD noted.   Marland Kitchen Respiratory: Clear to auscultation with no wheezes or rales. Good inspiratory effort. . Abdomen: Soft nontender nondistended with normal bowel sounds x4 quadrants. . Musculoskeletal: No lower extremity edema. 2/4 pulses in all 4  extremities. . Skin: No ulcerative lesions noted or rashes, . Psychiatry: Mood is appropriate for condition and setting   Data Reviewed: CBC: Recent Labs  Lab 08/16/17 1220 08/17/17 0326 08/18/17 0506 08/19/17 0817 08/20/17 0418  WBC 3.7* 18.3* 18.5* 22.1* 14.1*  NEUTROABS 3.4  --   --   --   --   HGB 12.8 9.7* 9.7* 10.6* 10.6*  HCT 39.8 30.8* 29.8* 33.0* 33.0*  MCV 100.5* 103.4* 99.0 98.2 97.1  PLT 133* 90* 86* 109* 588*   Basic Metabolic Panel: Recent Labs  Lab 08/16/17 1415 08/17/17 0326 08/18/17 0506 08/19/17 0817 08/20/17 0418  NA 141 143 141 142 143  K 3.0* 3.6 3.2* 3.2* 3.4*  CL 108 112* 109 110 110  CO2 20* 19* 20* 23 25  GLUCOSE 111* 113* 102* 106* 113*  BUN _0 CREATININE 1.79* 1.47* 1.02* 0.83 0.90  CALCIUM 8.0* 7.0* 7.4* 8.7* 8.9  MG  --  1.1* 2.4 1.9 1.8  PHOS  --  3.3 1.9*  --   --    GFR: Estimated Creatinine Clearance: 50 mL/min (by C-G formula based on SCr of 0.9 mg/dL). Liver Function Tests: Recent Labs  Lab 08/16/17 1415  AST 33  ALT 18  ALKPHOS 50  BILITOT 1.9*  PROT 6.2*  ALBUMIN 3.2*   Recent Labs  Lab 08/16/17 1415  LIPASE 29   No results for input(s): AMMONIA in the last 168 hours. Coagulation Profile: Recent Labs  Lab 08/16/17 1415  INR 1.10   Cardiac Enzymes: No results for input(s): CKTOTAL, CKMB, CKMBINDEX, TROPONINI in the last 168 hours. BNP (last 3 results) No results for input(s): PROBNP in the last 8760 hours. HbA1C: No results for input(s): HGBA1C in the last 72 hours. CBG: Recent Labs  Lab 08/16/17 2023 08/17/17 0116  GLUCAP 78 95   Lipid Profile: No results for input(s): CHOL, HDL, LDLCALC, TRIG, CHOLHDL, LDLDIRECT in the last 72 hours. Thyroid Function Tests: No results for input(s): TSH, T4TOTAL, FREET4, T3FREE, THYROIDAB in the last 72 hours. Anemia Panel: No results for input(s): VITAMINB12, FOLATE, FERRITIN, TIBC, IRON, RETICCTPCT in the last 72 hours. Urine analysis:    Component  Value Date/Time   COLORURINE YELLOW 08/16/2017 1250   APPEARANCEUR CLEAR 08/16/2017 1250   LABSPEC 1.009 08/16/2017 1250   LABSPEC 1.020 01/22/2011 1105   PHURINE 7.0 08/16/2017 1250   GLUCOSEU NEGATIVE 08/16/2017 1250   HGBUR SMALL (A) 08/16/2017 1250   BILIRUBINUR NEGATIVE 08/16/2017 1250   BILIRUBINUR Negative 01/22/2011 1105   KETONESUR NEGATIVE 08/16/2017 1250   PROTEINUR NEGATIVE 08/16/2017 1250   UROBILINOGEN 1.0 12/05/2012 1251   NITRITE NEGATIVE 08/16/2017 1250   LEUKOCYTESUR NEGATIVE 08/16/2017 1250   LEUKOCYTESUR Moderate 01/22/2011 1105   Sepsis Labs: _1 (procalcitonin:4,lacticidven:4)  ) Recent Results (from the past 240 hour(s))  Blood Culture (routine x 2)     Status: Abnormal   Collection Time: 08/16/17 12:10 PM  Result Value Ref Range Status   Specimen Description BLOOD LEFT HAND  Final   Special Requests   Final    BOTTLES DRAWN AEROBIC ONLY Blood Culture results may not be optimal due  to an inadequate volume of blood received in culture bottles   Culture  Setup Time   Final    GRAM NEGATIVE RODS AEROBIC BOTTLE ONLY CRITICAL VALUE NOTED.  VALUE IS CONSISTENT WITH PREVIOUSLY REPORTED AND CALLED VALUE.    Culture (A)  Final    KLEBSIELLA PNEUMONIAE SUSCEPTIBILITIES PERFORMED ON PREVIOUS CULTURE WITHIN THE LAST 5 DAYS. Performed at [facility], [place] [address], [place], [place] [address]    Report Status 08/19/2017 FINAL  Final  Blood Culture (routine x 2)     Status: Abnormal   Collection Time: 08/16/17 12:20 PM  Result Value Ref Range Status   Specimen Description BLOOD LEFT ANTECUBITAL  Final   Special Requests   Final    BOTTLES DRAWN AEROBIC AND ANAEROBIC Blood Culture adequate volume Performed at [facility], [place] [address], [place], [place] [address]    Culture  Setup Time   Final    GRAM NEGATIVE RODS IN BOTH AEROBIC AND ANAEROBIC BOTTLES CRITICAL RESULT CALLED TO, READ BACK BY AND VERIFIED WITH: L. SEAY,PHARMD 0126  08/17/2017 T. TYSOR    Culture KLEBSIELLA PNEUMONIAE (A)  Final   Report Status 08/19/2017 FINAL  Final   Organism ID, Bacteria KLEBSIELLA PNEUMONIAE  Final      Susceptibility   Klebsiella pneumoniae - MIC*    AMPICILLIN >=32 RESISTANT Resistant     CEFAZOLIN <=4 SENSITIVE Sensitive     CEFEPIME <=1 SENSITIVE Sensitive     CEFTAZIDIME <=1 SENSITIVE Sensitive     CEFTRIAXONE <=1 SENSITIVE Sensitive     CIPROFLOXACIN <=0.25 SENSITIVE Sensitive     GENTAMICIN <=1 SENSITIVE Sensitive     IMIPENEM <=0.25 SENSITIVE Sensitive     TRIMETH/SULFA <=20 SENSITIVE Sensitive     AMPICILLIN/SULBACTAM 4 SENSITIVE Sensitive     PIP/TAZO <=4 SENSITIVE Sensitive     Extended ESBL NEGATIVE Sensitive     * KLEBSIELLA PNEUMONIAE  Blood Culture ID Panel (Reflexed)     Status: Abnormal   Collection Time: 08/16/17 12:20 PM  Result Value Ref Range Status   Enterococcus species NOT DETECTED NOT DETECTED Final   Listeria monocytogenes NOT DETECTED NOT DETECTED Final   Staphylococcus species NOT DETECTED NOT DETECTED Final   Staphylococcus aureus NOT DETECTED NOT DETECTED Final   Streptococcus species NOT DETECTED NOT DETECTED Final   Streptococcus agalactiae NOT DETECTED NOT DETECTED Final   Streptococcus pneumoniae NOT DETECTED NOT DETECTED Final   Streptococcus pyogenes NOT DETECTED NOT DETECTED Final   Acinetobacter baumannii NOT DETECTED NOT DETECTED Final   Enterobacteriaceae species DETECTED (A) NOT DETECTED Final    Comment: Enterobacteriaceae represent a large family of gram-negative bacteria, not a single organism. CRITICAL RESULT CALLED TO, READ BACK BY AND VERIFIED WITH: L. SEAY,PHARMD 0126 08/17/2017 T. TYSOR    Enterobacter cloacae complex NOT DETECTED NOT DETECTED Final   Escherichia coli NOT DETECTED NOT DETECTED Final   Klebsiella oxytoca NOT DETECTED NOT DETECTED Final   Klebsiella pneumoniae DETECTED (A) NOT DETECTED Final    Comment: CRITICAL RESULT CALLED TO, READ BACK BY AND  VERIFIED WITH: L. SEAY,PHARMD 0126 08/17/2017 T. TYSOR    Proteus species NOT DETECTED NOT DETECTED Final   Serratia marcescens NOT DETECTED NOT DETECTED Final   Carbapenem resistance NOT DETECTED NOT DETECTED Final   Haemophilus influenzae NOT DETECTED NOT DETECTED Final   Neisseria meningitidis NOT DETECTED NOT DETECTED Final   Pseudomonas aeruginosa NOT DETECTED NOT DETECTED Final   Candida albicans NOT DETECTED NOT DETECTED Final  Candida glabrata NOT DETECTED NOT DETECTED Final   Candida krusei NOT DETECTED NOT DETECTED Final   Candida parapsilosis NOT DETECTED NOT DETECTED Final   Candida tropicalis NOT DETECTED NOT DETECTED Final  Urine culture     Status: Abnormal   Collection Time: 08/16/17 12:50 PM  Result Value Ref Range Status   Specimen Description URINE, CATHETERIZED  Final   Special Requests   Final    NONE Performed at [facility], [place] [address] [place], [place] [address]    Culture >=100,000 COLONIES/mL KLEBSIELLA PNEUMONIAE (A)  Final   Report Status 08/18/2017 FINAL  Final   Organism ID, Bacteria KLEBSIELLA PNEUMONIAE (A)  Final      Susceptibility   Klebsiella pneumoniae - MIC*    AMPICILLIN RESISTANT Resistant     CEFAZOLIN <=4 SENSITIVE Sensitive     CEFTRIAXONE <=1 SENSITIVE Sensitive     CIPROFLOXACIN <=0.25 SENSITIVE Sensitive     GENTAMICIN <=1 SENSITIVE Sensitive     IMIPENEM <=0.25 SENSITIVE Sensitive     NITROFURANTOIN 32 SENSITIVE Sensitive     TRIMETH/SULFA <=20 SENSITIVE Sensitive     AMPICILLIN/SULBACTAM <=2 SENSITIVE Sensitive     PIP/TAZO <=4 SENSITIVE Sensitive     Extended ESBL NEGATIVE Sensitive     * >=100,000 COLONIES/mL KLEBSIELLA PNEUMONIAE  Anaerobic culture     Status: None (Preliminary result)   Collection Time: 08/16/17  6:09 PM  Result Value Ref Range Status   Specimen Description CYSTOSCOPY RIGHT RENAL PELVIS  Final   Special Requests SPEC A  Final   Gram Stain   Final    ABUNDANT WBC PRESENT, PREDOMINANTLY  PMN ABUNDANT GRAM NEGATIVE RODS Performed at [facility], [address] [place], [place] [address]    Culture   Final    NO ANAEROBES ISOLATED; CULTURE IN PROGRESS FOR 5 DAYS   Report Status PENDING  Incomplete  Urine Culture     Status: Abnormal   Collection Time: 08/16/17  6:09 PM  Result Value Ref Range Status   Specimen Description CYSTOSCOPY RIGHT RENAL PELVIS  Final   Special Requests SPEC A  Final   Culture >=100,000 COLONIES/mL KLEBSIELLA PNEUMONIAE (A)  Final   Report Status 08/18/2017 FINAL  Final   Organism ID, Bacteria KLEBSIELLA PNEUMONIAE (A)  Final      Susceptibility   Klebsiella pneumoniae - MIC*    AMPICILLIN RESISTANT Resistant     CEFAZOLIN <=4 SENSITIVE Sensitive     CEFEPIME <=1 SENSITIVE Sensitive     CEFTAZIDIME <=1 SENSITIVE Sensitive     CEFTRIAXONE <=1 SENSITIVE Sensitive     CIPROFLOXACIN <=0.25 SENSITIVE Sensitive     GENTAMICIN <=1 SENSITIVE Sensitive     IMIPENEM <=0.25 SENSITIVE Sensitive     TRIMETH/SULFA <=20 SENSITIVE Sensitive     AMPICILLIN/SULBACTAM 4 SENSITIVE Sensitive     PIP/TAZO <=4 SENSITIVE Sensitive     Extended ESBL NEGATIVE Sensitive     * >=100,000 COLONIES/mL KLEBSIELLA PNEUMONIAE  MRSA PCR Screening     Status: None   Collection Time: 08/16/17  8:49 PM  Result Value Ref Range Status   MRSA by PCR NEGATIVE NEGATIVE Final    Comment:        The GeneXpert MRSA Assay (FDA approved for NASAL specimens only), is one component of a comprehensive MRSA colonization surveillance program. It is not intended to diagnose MRSA infection nor to guide or monitor treatment for MRSA infections. Performed at [facility], [place] [address]  [place], [place] [address]       Studies: No results found.  Scheduled Meds: . allopurinol  300 mg Oral Daily  . cholecalciferol  1,000 Units Oral Daily  . enoxaparin (LOVENOX) injection  40 mg Subcutaneous Q24H  . gabapentin  600 mg Oral BID  . latanoprost  1 drop Both Eyes QHS   . multivitamin with minerals  1 tablet Oral Daily  . pantoprazole  40 mg Oral Daily  . polyethylene glycol  17 g Oral Daily    Continuous Infusions: . sodium chloride    . cefTRIAXone (ROCEPHIN)  IV Stopped (08/20/17 0930)     LOS: 4 days     Kayleen Memos, MD [facility] Pager [phone]  If 7PM-7AM, please contact night-coverage [email or web address] Password [facility] 08/20/2017, 11:58 AM

## 2017-08-20 NOTE — Progress Notes (Signed)
Physical Therapy Treatment Patient Details Name: Carmen Fisher MRN: [id number] DOB: 07/15/1940 Today's Date: 08/20/2017    History of Present Illness 77 yo F with PMHx HTN, multiple myeloma here with weakness/confusion. Found to have sepsis.    PT Comments    Pt is progressing towards mobility goals, as she required less assist with transfers and gait during today's session. Will continue to follow to progress ambulation and safety with mobility.    Follow Up Recommendations  Home health PT;Supervision for mobility/OOB(vs no follow up pending progression)     Equipment Recommendations  None recommended by PT    Recommendations for Other Services       Precautions / Restrictions Precautions Precautions: Fall Restrictions Weight Bearing Restrictions: No    Mobility  Bed Mobility Overal bed mobility: Modified Independent             General bed mobility comments: in chair on arrival  Transfers Overall transfer level: Needs assistance   Transfers: Sit to/from Stand Sit to Stand: Supervision         General transfer comment: supervision for safety. Cues for hand placement when standing  Ambulation/Gait Ambulation/Gait assistance: Min guard Ambulation Distance (Feet): 300 Feet Assistive device: Rolling walker (2 wheeled) Gait Pattern/deviations: Step-through pattern;Decreased stride length;Drifts right/left;Narrow base of support Gait velocity: decreased Gait velocity interpretation: <1.8 ft/sec, indicate of risk for recurrent falls General Gait Details: Pt with small narrow steps. Cued for increased step length and speed. Pt reports discomfort at urinary cathater site making gait dificult. Overall steady with RW for support.   Stairs             Wheelchair Mobility    Modified Rankin (Stroke Patients Only)       Balance Overall balance assessment: Mild deficits observed, not formally tested                                          Cognition Arousal/Alertness: Awake/alert Behavior During Therapy: WFL for tasks assessed/performed Overall Cognitive Status: Within Functional Limits for tasks assessed                                        Exercises      General Comments General comments (skin integrity, edema, etc.): Pt expressed concern with tub transfers. This PTA demonstrated correct technique to transfer to tub with RW and shower chair. Pt verbalized understanding. Pt assisted to restroom for toileting. Pt independent with peri care.      Pertinent Vitals/Pain Pain Assessment: No/denies pain    Home Living                      Prior Function            PT Goals (current goals can now be found in the care plan section) Acute Rehab PT Goals Patient Stated Goal: to go home PT Goal Formulation: With patient Time For Goal Achievement: 09/01/17 Potential to Achieve Goals: Good Progress towards PT goals: Progressing toward goals    Frequency    Min 3X/week      PT Plan Current plan remains appropriate    Co-evaluation              AM-PAC PT "6 Clicks" Daily Activity  Outcome Measure  Difficulty turning  over in bed (including adjusting bedclothes, sheets and blankets)?: A Little Difficulty moving from lying on back to sitting on the side of the bed? : A Little Difficulty sitting down on and standing up from a chair with arms (e.g., wheelchair, bedside commode, etc,.)?: A Little Help needed moving to and from a bed to chair (including a wheelchair)?: A Little Help needed walking in hospital room?: A Little Help needed climbing 3-5 steps with a railing? : A Little 6 Click Score: 18    End of Session Equipment Utilized During Treatment: Gait belt Activity Tolerance: Patient tolerated treatment well Patient left: with call bell/phone within reach;with family/visitor present;in chair;with chair alarm set Nurse Communication: Mobility status PT Visit Diagnosis:  Unsteadiness on feet (R26.81)     Time: 3383-2919 PT Time Calculation (min) (ACUTE ONLY): 29 min  Charges:  $Gait Training: 8-22 mins $Therapeutic Activity: 8-22 mins                    G CodesBenjiman Fisher, [place] Pager [phone] Acute Rehab    Allena Katz 08/20/2017, 10:48 AM

## 2017-08-21 ENCOUNTER — Encounter (HOSPITAL_COMMUNITY): Payer: Self-pay | Admitting: General Practice

## 2017-08-21 ENCOUNTER — Other Ambulatory Visit: Payer: Self-pay

## 2017-08-21 DIAGNOSIS — N200 Calculus of kidney: Secondary | ICD-10-CM

## 2017-08-21 LAB — MAGNESIUM: Magnesium: 1.8 mg/dL (ref 1.7–2.4)

## 2017-08-21 LAB — BASIC METABOLIC PANEL
Anion gap: 8 (ref 5–15)
BUN: 13 mg/dL (ref 6–20)
CO2: 25 mmol/L (ref 22–32)
Calcium: 9.2 mg/dL (ref 8.9–10.3)
Chloride: 109 mmol/L (ref 101–111)
Creatinine, Ser: 0.96 mg/dL (ref 0.44–1.00)
GFR calc Af Amer: >60 mL/min (ref >60)
GFR calc non Af Amer: 56 mL/min — ABNORMAL LOW (ref >60)
Glucose, Bld: 107 mg/dL — ABNORMAL HIGH (ref 65–99)
Potassium: 3.7 mmol/L (ref 3.5–5.1)
Sodium: 142 mmol/L (ref 135–145)

## 2017-08-21 LAB — CBC
HCT: 32.8 % — ABNORMAL LOW (ref 36.0–46.0)
Hemoglobin: 10.6 g/dL — ABNORMAL LOW (ref 12.0–15.0)
MCH: 31.5 pg (ref 26.0–34.0)
MCHC: 32.3 g/dL (ref 30.0–36.0)
MCV: 97.6 fL (ref 78.0–100.0)
Platelets: 147 10*3/uL — ABNORMAL LOW (ref 150–400)
RBC: 3.36 MIL/uL — ABNORMAL LOW (ref 3.87–5.11)
RDW: 14.1 % (ref 11.5–15.5)
WBC: 10.8 10*3/uL — ABNORMAL HIGH (ref 4.0–10.5)

## 2017-08-21 LAB — ANAEROBIC CULTURE

## 2017-08-21 MED ORDER — CEPHALEXIN 500 MG PO CAPS
500.0000 mg | ORAL_CAPSULE | Freq: Two times a day (BID) | ORAL | Status: DC
  Administered 2017-08-21 – 2017-08-22 (×2): 500 mg via ORAL
  Filled 2017-08-21 (×2): qty 1

## 2017-08-21 NOTE — Progress Notes (Signed)
Foley removed per order. Pt tolerated well. Will monitor output.

## 2017-08-21 NOTE — Progress Notes (Signed)
PROGRESS NOTE  Carmen Fisher GXQ:[id number] DOB: 1940-07-29 DOA: 08/16/2017 PCP: Deland Pretty, MD  HPI/Recap of past 57 hours: 77 year old female with history of gout, glaucoma, essential hypertension, multiple myeloma-not on any medications, osteoarthritis came to the hospital for evaluation of fever, change in mental status, nausea and vomiting.  Upon admission her UA was positive for urinary tract infection, CT of the head was negative, CT of the abdomen showed 12 mm stone in the right UPJ with hydronephrosis.  Patient was initially taken to the ICU for closer monitoring.  Urology was consulted who performed cystoscopy with stent placement on 08/16/2017.  Initially patient was started on IV Rocephin.  Over the course of 24 hours as she did better in the ICU she was transferred out to telemetry for further care and monitoring. Patient's urine culture ended up growing Klebsiella which was sensitive to Rocephin.  Her blood culture was also positive for Klebsiella.  On 5/20 patient spiked fever therefore she had to be recultured.  08/20/2017: Patient seen and examined at bedside.  She has no new complaints.  Elevated temperature overnight with T-max of 100.1.  White blood cell count remains elevated but trending down.  Continue IV antibiotics and monitor fever curve.  08/21/17: Tmax 100.2 and tachycardia. Spoke with urology who recommended removing foley cath and doing a voiding trial. No new complaints.  Assessment/Plan: Principal Problem:   Acute pyelonephritis Active Problems:   Multiple myeloma (HCC)   Altered mental status   SIRS (systemic inflammatory response syndrome) (HCC)  Sepsis 2/2 to klebsiella UTI complicated by klebsiella pneumoniae bacteremia Tmax 100.2 overnight Wbc 10 from 14k from 22k Repeat CBC in the morning Monitor fever curve Continue IV antibiotics Will need 14 days of antibiotics  Klebsiella pneumoniae bacteremia Positive blood cultures x2 drawn on  08/16/2017 Repeated blood cultures x2 drawn on 08/19/2017 in process Continue IV ceftriaxone  Urinary retention Voiding trial initiated today Monitor urine output  R hydronephrosis s/p stent placement, stable  Sinus tachycardia Possibly related to her sepsis Continue to closely monitor Encourage oral fluid intake  Multiple myeloma, stable Not currently being treated  Hypokalemia, resolved K+ 3.7  Hypomagnesemia Magnesium 1.8 Repleted with IV magnesium 2 g once  Gout, stable continue allopurinol  GERD, stable continue Protonix p.o.    Code Status: Full code  Family Communication: Granddaughter and daughter updated  Disposition Plan: Home when clinically stable   Consultants:  None  Procedures:  none  Antimicrobials:  keflex  DVT prophylaxis: Subcu Lovenox   Objective: Vitals:   08/20/17 2346 08/21/17 0539 08/21/17 0539 08/21/17 1757  BP:   140/85 127/78  Pulse:   89 94  Resp:   17   Temp: 99.4 F (37.4 C)  98.3 F (36.8 C) 98.3 F (36.8 C)  TempSrc: Oral   Oral  SpO2:   100% 98%  Weight:  76.5 kg (168 lb 10.4 oz)    Height:        Intake/Output Summary (Last 24 hours) at 08/21/2017 2012 Last data filed at 08/21/2017 1743 Gross per 24 hour  Intake 477 ml  Output 850 ml  Net -373 ml   Filed Weights   08/19/17 1015 08/20/17 0500 08/21/17 0539  Weight: 73.6 kg (162 lb 3.2 oz) 74.9 kg (165 lb 2 oz) 76.5 kg (168 lb 10.4 oz)    Exam:  . General: 77 y.o. year-old female WD WN NAD A&O x 3 . Cardiovascular: RRR no rubs or gallops. No JVD or thyromegaly.Marland Kitchen   Marland Kitchen  Respiratory: Clear to auscultation with no wheezes or rales. Good inspiratory effort. . Abdomen: Soft nontender nondistended with normal bowel sounds x4 quadrants. . Musculoskeletal: No lower extremity edema. 2/4 pulses in all 4 extremities. . Skin: No ulcerative lesions noted or rashes, . Psychiatry: Mood is appropriate for condition and setting   Data Reviewed: CBC: Recent Labs   Lab 08/16/17 1220 08/17/17 0326 08/18/17 0506 08/19/17 0817 08/20/17 0418 08/21/17 0305  WBC 3.7* 18.3* 18.5* 22.1* 14.1* 10.8*  NEUTROABS 3.4  --   --   --   --   --   HGB 12.8 9.7* 9.7* 10.6* 10.6* 10.6*  HCT 39.8 30.8* 29.8* 33.0* 33.0* 32.8*  MCV 100.5* 103.4* 99.0 98.2 97.1 97.6  PLT 133* 90* 86* 109* 119* 502*   Basic Metabolic Panel: Recent Labs  Lab 08/17/17 0326 08/18/17 0506 08/19/17 0817 08/20/17 0418 08/21/17 0305  NA 143 141 142 143 142  K 3.6 3.2* 3.2* 3.4* 3.7  CL 112* 109 110 110 109  CO2 19* 20* 23 25 25   GLUCOSE 113* 102* 106* 113* 107*  BUN 15 10 6 10 13   CREATININE 1.47* 1.02* 0.83 0.90 0.96  CALCIUM 7.0* 7.4* 8.7* 8.9 9.2  MG 1.1* 2.4 1.9 1.8 1.8  PHOS 3.3 1.9*  --   --   --    GFR: Estimated Creatinine Clearance: 47.4 mL/min (by C-G formula based on SCr of 0.96 mg/dL). Liver Function Tests: Recent Labs  Lab 08/16/17 1415  AST 33  ALT 18  ALKPHOS 50  BILITOT 1.9*  PROT 6.2*  ALBUMIN 3.2*   Recent Labs  Lab 08/16/17 1415  LIPASE 29   No results for input(s): AMMONIA in the last 168 hours. Coagulation Profile: Recent Labs  Lab 08/16/17 1415  INR 1.10   Cardiac Enzymes: No results for input(s): CKTOTAL, CKMB, CKMBINDEX, TROPONINI in the last 168 hours. BNP (last 3 results) No results for input(s): PROBNP in the last 8760 hours. HbA1C: No results for input(s): HGBA1C in the last 72 hours. CBG: Recent Labs  Lab 08/16/17 2023 08/17/17 0116  GLUCAP 78 95   Lipid Profile: No results for input(s): CHOL, HDL, LDLCALC, TRIG, CHOLHDL, LDLDIRECT in the last 72 hours. Thyroid Function Tests: No results for input(s): TSH, T4TOTAL, FREET4, T3FREE, THYROIDAB in the last 72 hours. Anemia Panel: No results for input(s): VITAMINB12, FOLATE, FERRITIN, TIBC, IRON, RETICCTPCT in the last 72 hours. Urine analysis:    Component Value Date/Time   COLORURINE YELLOW 08/16/2017 1250   APPEARANCEUR CLEAR 08/16/2017 1250   LABSPEC 1.009  08/16/2017 1250   LABSPEC 1.020 01/22/2011 1105   PHURINE 7.0 08/16/2017 1250   GLUCOSEU NEGATIVE 08/16/2017 1250   HGBUR SMALL (A) 08/16/2017 1250   BILIRUBINUR NEGATIVE 08/16/2017 1250   BILIRUBINUR Negative 01/22/2011 1105   KETONESUR NEGATIVE 08/16/2017 1250   PROTEINUR NEGATIVE 08/16/2017 1250   UROBILINOGEN 1.0 12/05/2012 1251   NITRITE NEGATIVE 08/16/2017 1250   LEUKOCYTESUR NEGATIVE 08/16/2017 1250   LEUKOCYTESUR Moderate 01/22/2011 1105   Sepsis Labs: @LABRCNTIP (procalcitonin:4,lacticidven:4)  ) Recent Results (from the past 240 hour(s))  Blood Culture (routine x 2)     Status: Abnormal   Collection Time: 08/16/17 12:10 PM  Result Value Ref Range Status   Specimen Description BLOOD LEFT HAND  Final   Special Requests   Final    BOTTLES DRAWN AEROBIC ONLY Blood Culture results may not be optimal due to an inadequate volume of blood received in culture bottles   Culture  Setup Time  Final    GRAM NEGATIVE RODS AEROBIC BOTTLE ONLY CRITICAL VALUE NOTED.  VALUE IS CONSISTENT WITH PREVIOUSLY REPORTED AND CALLED VALUE.    Culture (A)  Final    KLEBSIELLA PNEUMONIAE SUSCEPTIBILITIES PERFORMED ON PREVIOUS CULTURE WITHIN THE LAST 5 DAYS. Performed at [facility], [place] [address] [place], [place] [address]    Report Status 08/19/2017 FINAL  Final  Blood Culture (routine x 2)     Status: Abnormal   Collection Time: 08/16/17 12:20 PM  Result Value Ref Range Status   Specimen Description BLOOD LEFT ANTECUBITAL  Final   Special Requests   Final    BOTTLES DRAWN AEROBIC AND ANAEROBIC Blood Culture adequate volume Performed at [facility], [place] [address] [place], [place] [address]    Culture  Setup Time   Final    GRAM NEGATIVE RODS IN BOTH AEROBIC AND ANAEROBIC BOTTLES CRITICAL RESULT CALLED TO, READ BACK BY AND VERIFIED WITH: L. SEAY,PHARMD 0126 08/17/2017 T. TYSOR    Culture KLEBSIELLA PNEUMONIAE (A)  Final   Report Status 08/19/2017 FINAL  Final    Organism ID, Bacteria KLEBSIELLA PNEUMONIAE  Final      Susceptibility   Klebsiella pneumoniae - MIC*    AMPICILLIN >=32 RESISTANT Resistant     CEFAZOLIN <=4 SENSITIVE Sensitive     CEFEPIME <=1 SENSITIVE Sensitive     CEFTAZIDIME <=1 SENSITIVE Sensitive     CEFTRIAXONE <=1 SENSITIVE Sensitive     CIPROFLOXACIN <=0.25 SENSITIVE Sensitive     GENTAMICIN <=1 SENSITIVE Sensitive     IMIPENEM <=0.25 SENSITIVE Sensitive     TRIMETH/SULFA <=20 SENSITIVE Sensitive     AMPICILLIN/SULBACTAM 4 SENSITIVE Sensitive     PIP/TAZO <=4 SENSITIVE Sensitive     Extended ESBL NEGATIVE Sensitive     * KLEBSIELLA PNEUMONIAE  Blood Culture ID Panel (Reflexed)     Status: Abnormal   Collection Time: 08/16/17 12:20 PM  Result Value Ref Range Status   Enterococcus species NOT DETECTED NOT DETECTED Final   Listeria monocytogenes NOT DETECTED NOT DETECTED Final   Staphylococcus species NOT DETECTED NOT DETECTED Final   Staphylococcus aureus NOT DETECTED NOT DETECTED Final   Streptococcus species NOT DETECTED NOT DETECTED Final   Streptococcus agalactiae NOT DETECTED NOT DETECTED Final   Streptococcus pneumoniae NOT DETECTED NOT DETECTED Final   Streptococcus pyogenes NOT DETECTED NOT DETECTED Final   Acinetobacter baumannii NOT DETECTED NOT DETECTED Final   Enterobacteriaceae species DETECTED (A) NOT DETECTED Final    Comment: Enterobacteriaceae represent a large family of gram-negative bacteria, not a single organism. CRITICAL RESULT CALLED TO, READ BACK BY AND VERIFIED WITH: L. SEAY,PHARMD 0126 08/17/2017 T. TYSOR    Enterobacter cloacae complex NOT DETECTED NOT DETECTED Final   Escherichia coli NOT DETECTED NOT DETECTED Final   Klebsiella oxytoca NOT DETECTED NOT DETECTED Final   Klebsiella pneumoniae DETECTED (A) NOT DETECTED Final    Comment: CRITICAL RESULT CALLED TO, READ BACK BY AND VERIFIED WITH: L. SEAY,PHARMD 0126 08/17/2017 T. TYSOR    Proteus species NOT DETECTED NOT DETECTED Final    Serratia marcescens NOT DETECTED NOT DETECTED Final   Carbapenem resistance NOT DETECTED NOT DETECTED Final   Haemophilus influenzae NOT DETECTED NOT DETECTED Final   Neisseria meningitidis NOT DETECTED NOT DETECTED Final   Pseudomonas aeruginosa NOT DETECTED NOT DETECTED Final   Candida albicans NOT DETECTED NOT DETECTED Final   Candida glabrata NOT DETECTED NOT DETECTED Final   Candida krusei NOT DETECTED NOT DETECTED Final  Candida parapsilosis NOT DETECTED NOT DETECTED Final   Candida tropicalis NOT DETECTED NOT DETECTED Final  Urine culture     Status: Abnormal   Collection Time: 08/16/17 12:50 PM  Result Value Ref Range Status   Specimen Description URINE, CATHETERIZED  Final   Special Requests   Final    NONE Performed at [facility], [address] [place], [place] [address]    Culture >=100,000 COLONIES/mL KLEBSIELLA PNEUMONIAE (A)  Final   Report Status 08/18/2017 FINAL  Final   Organism ID, Bacteria KLEBSIELLA PNEUMONIAE (A)  Final      Susceptibility   Klebsiella pneumoniae - MIC*    AMPICILLIN RESISTANT Resistant     CEFAZOLIN <=4 SENSITIVE Sensitive     CEFTRIAXONE <=1 SENSITIVE Sensitive     CIPROFLOXACIN <=0.25 SENSITIVE Sensitive     GENTAMICIN <=1 SENSITIVE Sensitive     IMIPENEM <=0.25 SENSITIVE Sensitive     NITROFURANTOIN 32 SENSITIVE Sensitive     TRIMETH/SULFA <=20 SENSITIVE Sensitive     AMPICILLIN/SULBACTAM <=2 SENSITIVE Sensitive     PIP/TAZO <=4 SENSITIVE Sensitive     Extended ESBL NEGATIVE Sensitive     * >=100,000 COLONIES/mL KLEBSIELLA PNEUMONIAE  Anaerobic culture     Status: None   Collection Time: 08/16/17  6:09 PM  Result Value Ref Range Status   Specimen Description CYSTOSCOPY RIGHT RENAL PELVIS  Final   Special Requests SPEC A  Final   Gram Stain   Final    ABUNDANT WBC PRESENT, PREDOMINANTLY PMN ABUNDANT GRAM NEGATIVE RODS    Culture   Final    NO ANAEROBES ISOLATED Performed at [facility], [address]  [place], [place] [address]    Report Status 08/21/2017 FINAL  Final  Urine Culture     Status: Abnormal   Collection Time: 08/16/17  6:09 PM  Result Value Ref Range Status   Specimen Description CYSTOSCOPY RIGHT RENAL PELVIS  Final   Special Requests SPEC A  Final   Culture >=100,000 COLONIES/mL KLEBSIELLA PNEUMONIAE (A)  Final   Report Status 08/18/2017 FINAL  Final   Organism ID, Bacteria KLEBSIELLA PNEUMONIAE (A)  Final      Susceptibility   Klebsiella pneumoniae - MIC*    AMPICILLIN RESISTANT Resistant     CEFAZOLIN <=4 SENSITIVE Sensitive     CEFEPIME <=1 SENSITIVE Sensitive     CEFTAZIDIME <=1 SENSITIVE Sensitive     CEFTRIAXONE <=1 SENSITIVE Sensitive     CIPROFLOXACIN <=0.25 SENSITIVE Sensitive     GENTAMICIN <=1 SENSITIVE Sensitive     IMIPENEM <=0.25 SENSITIVE Sensitive     TRIMETH/SULFA <=20 SENSITIVE Sensitive     AMPICILLIN/SULBACTAM 4 SENSITIVE Sensitive     PIP/TAZO <=4 SENSITIVE Sensitive     Extended ESBL NEGATIVE Sensitive     * >=100,000 COLONIES/mL KLEBSIELLA PNEUMONIAE  MRSA PCR Screening     Status: None   Collection Time: 08/16/17  8:49 PM  Result Value Ref Range Status   MRSA by PCR NEGATIVE NEGATIVE Final    Comment:        The GeneXpert MRSA Assay (FDA approved for NASAL specimens only), is one component of a comprehensive MRSA colonization surveillance program. It is not intended to diagnose MRSA infection nor to guide or monitor treatment for MRSA infections. Performed at [facility], [place] [address], [place], [place] [address]   Culture, blood (Routine X 2) w Reflex to ID Panel     Status: None (Preliminary result)  Collection Time: 08/19/17  8:20 AM  Result Value Ref Range Status   Specimen Description BLOOD RIGHT HAND  Final   Special Requests   Final    BOTTLES DRAWN AEROBIC ONLY Blood Culture adequate volume   Culture   Final    NO GROWTH 2 DAYS Performed at [facility], [address], [place], [place] [address]    Report  Status PENDING  Incomplete  Culture, blood (Routine X 2) w Reflex to ID Panel     Status: None (Preliminary result)   Collection Time: 08/19/17  8:22 AM  Result Value Ref Range Status   Specimen Description BLOOD RIGHT HAND  Final   Special Requests   Final    BOTTLES DRAWN AEROBIC ONLY Blood Culture adequate volume   Culture   Final    NO GROWTH 2 DAYS Performed at [facility], [place] [address], [place], [place] [address]    Report Status PENDING  Incomplete      Studies: No results found.  Scheduled Meds: . allopurinol  300 mg Oral Daily  . cholecalciferol  1,000 Units Oral Daily  . dorzolamide-timolol  1 drop Both Eyes BID  . enoxaparin (LOVENOX) injection  40 mg Subcutaneous Q24H  . gabapentin  600 mg Oral BID  . latanoprost  1 drop Both Eyes QHS  . multivitamin with minerals  1 tablet Oral Daily  . pantoprazole  40 mg Oral Daily  . polyethylene glycol  17 g Oral Daily    Continuous Infusions: . sodium chloride    . cefTRIAXone (ROCEPHIN)  IV Stopped (08/21/17 1320)  . magnesium sulfate 1 - 4 g bolus IVPB       LOS: 5 days     Kayleen Memos, MD [facility] Pager [phone]  If 7PM-7AM, please contact night-coverage [email or web address] Password [facility] 08/21/2017, 8:12 PM

## 2017-08-21 NOTE — Progress Notes (Signed)
Physical Therapy Treatment Patient Details Name: Carmen Fisher MRN: [id number] DOB: [birth year] Today's Date: 08/21/2017    History of Present Illness 77 yo F with PMHx HTN, multiple myeloma here with weakness/confusion. Found to have sepsis.    PT Comments    Pt continues to progress towards mobility goals and shows improved gait quality now that urinary catheter has been removed. Still feel that HHPT is appropriate at d/c as pt demonstrates some balance deficits and reports she has fallen twice recently. Will continue to follow acutely.    Follow Up Recommendations  Home health PT;Supervision for mobility/OOB(vs no follow up pending progression)     Equipment Recommendations  None recommended by PT    Recommendations for Other Services       Precautions / Restrictions Precautions Precautions: Fall Restrictions Weight Bearing Restrictions: No    Mobility  Bed Mobility Overal bed mobility: Modified Independent             General bed mobility comments: Increased time and effort.  Transfers Overall transfer level: Needs assistance Equipment used: Rolling walker (2 wheeled) Transfers: Sit to/from Stand Sit to Stand: Supervision         General transfer comment: supervision for safety. Cues for hand placement when standing  Ambulation/Gait Ambulation/Gait assistance: Min guard Ambulation Distance (Feet): 350 Feet Assistive device: Rolling walker (2 wheeled) Gait Pattern/deviations: Step-through pattern;Decreased stride length;Narrow base of support Gait velocity: decreased   General Gait Details: Pt with small narrow steps. Cued for increased step length, speed, and wider BOS. Pt able to correct gait deviations with cueing and effort.    Stairs             Wheelchair Mobility    Modified Rankin (Stroke Patients Only)       Balance Overall balance assessment: Mild deficits observed, not formally tested;History of Falls                                          Cognition Arousal/Alertness: Awake/alert Behavior During Therapy: WFL for tasks assessed/performed Overall Cognitive Status: Within Functional Limits for tasks assessed                                        Exercises      General Comments General comments (skin integrity, edema, etc.): Urinary catheter removed just prior to treatment.      Pertinent Vitals/Pain Pain Assessment: No/denies pain    Home Living                      Prior Function            PT Goals (current goals can now be found in the care plan section) Acute Rehab PT Goals Patient Stated Goal: to go home PT Goal Formulation: With patient Time For Goal Achievement: 09/01/17 Potential to Achieve Goals: Good Progress towards PT goals: Progressing toward goals    Frequency    Min 3X/week      PT Plan Current plan remains appropriate    Co-evaluation              AM-PAC PT "6 Clicks" Daily Activity  Outcome Measure  Difficulty turning over in bed (including adjusting bedclothes, sheets and blankets)?: A Little Difficulty moving from lying on back to  sitting on the side of the bed? : A Little Difficulty sitting down on and standing up from a chair with arms (e.g., wheelchair, bedside commode, etc,.)?: A Little Help needed moving to and from a bed to chair (including a wheelchair)?: A Little Help needed walking in hospital room?: A Little Help needed climbing 3-5 steps with a railing? : A Little 6 Click Score: 18    End of Session Equipment Utilized During Treatment: Gait belt Activity Tolerance: Patient tolerated treatment well Patient left: with call bell/phone within reach;with family/visitor present;in chair Nurse Communication: Mobility status PT Visit Diagnosis: Unsteadiness on feet (R26.81)     Time: 5749-3552 PT Time Calculation (min) (ACUTE ONLY): 22 min  Charges:  $Gait Training: 8-22 mins                    G  Codes:       Benjiman Core, [place] Pager [phone] Acute Rehab   Allena Katz 08/21/2017, 2:00 PM

## 2017-08-21 NOTE — Care Management Important Message (Signed)
Important Message  Patient Details  Name: Carmen Fisher MRN: [id number] Date of Birth: 1941/03/22   Medicare Important Message Given:  Yes    Orbie Pyo 08/21/2017, 12:17 PM

## 2017-08-22 LAB — BASIC METABOLIC PANEL
Anion gap: 10 (ref 5–15)
BUN: 14 mg/dL (ref 6–20)
CO2: 25 mmol/L (ref 22–32)
Calcium: 8.9 mg/dL (ref 8.9–10.3)
Chloride: 107 mmol/L (ref 101–111)
Creatinine, Ser: 0.91 mg/dL (ref 0.44–1.00)
GFR calc Af Amer: >60 mL/min (ref >60)
GFR calc non Af Amer: >60 mL/min (ref >60)
Glucose, Bld: 111 mg/dL — ABNORMAL HIGH (ref 65–99)
Potassium: 3.3 mmol/L — ABNORMAL LOW (ref 3.5–5.1)
Sodium: 142 mmol/L (ref 135–145)

## 2017-08-22 LAB — CBC
HCT: 30.8 % — ABNORMAL LOW (ref 36.0–46.0)
Hemoglobin: 10 g/dL — ABNORMAL LOW (ref 12.0–15.0)
MCH: 31.5 pg (ref 26.0–34.0)
MCHC: 32.5 g/dL (ref 30.0–36.0)
MCV: 97.2 fL (ref 78.0–100.0)
Platelets: 170 10*3/uL (ref 150–400)
RBC: 3.17 MIL/uL — ABNORMAL LOW (ref 3.87–5.11)
RDW: 14 % (ref 11.5–15.5)
WBC: 10.4 10*3/uL (ref 4.0–10.5)

## 2017-08-22 LAB — MAGNESIUM: Magnesium: 1.8 mg/dL (ref 1.7–2.4)

## 2017-08-22 MED ORDER — CEPHALEXIN 500 MG PO CAPS: 500.0000 mg | ORAL_CAPSULE | Freq: Two times a day (BID) | ORAL | 0 refills | Status: DC

## 2017-08-22 MED ORDER — POTASSIUM CHLORIDE CRYS ER 20 MEQ PO TBCR
40.0000 meq | EXTENDED_RELEASE_TABLET | Freq: Once | ORAL | Status: AC
Start: 2017-08-22 — End: 2017-08-22
  Administered 2017-08-22: 40 meq via ORAL
  Filled 2017-08-22: qty 2

## 2017-08-22 NOTE — Progress Notes (Signed)
Nsg Discharge Note  Admit Date:  08/16/2017 Discharge date: 08/22/2017   Carmen Fisher to be D/C'd Home per MD order.  AVS completed.  Copy for chart, and copy for patient signed, and dated. Patient/caregiver able to verbalize understanding.  Discharge Medication: Allergies as of 08/22/2017      Reactions   Aspirin Anaphylaxis   Ampicillin Rash   Aspirin Swelling   Sulfa Antibiotics Hives   Sulfa Antibiotics Swelling   Penicillins Itching, Rash   Has patient had a PCN reaction causing immediate rash, facial/tongue/throat swelling, SOB or lightheadedness with hypotension: Yes Has patient had a PCN reaction causing severe rash involving mucus membranes or skin necrosis: No Has patient had a PCN reaction that required hospitalization: No Has patient had a PCN reaction occurring within the last 10 years: No If all of the above answers are "NO", then may proceed with Cephalosporin use. Rash and itching Has received Zosyn and Augmentin in th      Medication List    STOP taking these medications   acetaminophen 500 MG tablet Commonly known as:  TYLENOL   Potassium Chloride ER 20 MEQ Tbcr   timolol 0.5 % ophthalmic solution Commonly known as:  BETIMOL     TAKE these medications   allopurinol 300 MG tablet Commonly known as:  ZYLOPRIM TAKE 1 TABLET DAILY   cephALEXin 500 MG capsule Commonly known as:  KEFLEX Take 1 capsule (500 mg total) by mouth every 12 (twelve) hours.   cholecalciferol 1000 units tablet Commonly known as:  VITAMIN D Take 1,000 Units by mouth daily.   Cranberry 250 MG Tabs Take 250 mg by mouth 2 (two) times daily.   diphenoxylate-atropine 2.5-0.025 MG tablet Commonly known as:  LOMOTIL Take 1 tablet by mouth 4 (four) times daily as needed for diarrhea or loose stools.   dorzolamide-timolol 22.3-6.8 MG/ML ophthalmic solution Commonly known as:  COSOPT Place 1 drop into both eyes 2 (two) times daily.   gabapentin 600 MG tablet Commonly known as:   NEURONTIN Take 1 tablet (600 mg total) by mouth 2 (two) times daily.   KLOR-CON M20 20 MEQ tablet Generic drug:  potassium chloride SA TAKE 1 AND 1/2 TABLETS DAILY   latanoprost 0.005 % ophthalmic solution Commonly known as:  XALATAN Place 1 drop into both eyes at bedtime.   loperamide 2 MG capsule Commonly known as:  IMODIUM TAKE 1 CAPSULE AS NEEDED   FOR DIARRHEA OR LOOSE STOOL(TAKE UP TO 6 CAPSULES     DAILY)   LORazepam 1 MG tablet Commonly known as:  ATIVAN Take 1 mg by mouth every 4 (four) hours as needed (neuropathy). Reported on 09/14/2015   multivitamin with minerals Tabs tablet Take 1 tablet by mouth daily.   pantoprazole 40 MG tablet Commonly known as:  PROTONIX Take 1 tablet (40 mg total) by mouth daily.   prochlorperazine 10 MG tablet Commonly known as:  COMPAZINE Take 10 mg by mouth every 6 (six) hours as needed. Reported on 09/14/2015       Discharge Assessment: Vitals:   08/21/17 2132 08/22/17 0600  BP: (!) 141/84 135/72  Pulse: 99 90  Resp: 18 18  Temp: 99.1 F (37.3 C) 99.3 F (37.4 C)  SpO2: 98% 98%   Skin clean, dry and intact without evidence of skin break down, no evidence of skin tears noted. IV catheter discontinued intact. Site without signs and symptoms of complications - no redness or edema noted at insertion site, patient denies c/o pain -  only slight tenderness at site.  Dressing with slight pressure applied.  D/c Instructions-Education: Discharge instructions given to patient/family with verbalized understanding. D/c education completed with patient/family including follow up instructions, medication list, d/c activities limitations if indicated, with other d/c instructions as indicated by MD - patient able to verbalize understanding, all questions fully answered. Patient instructed to return to ED, call 911, or call MD for any changes in condition.  Patient escorted via [place], and D/C home via private auto.  Hassan Rowan, RN 08/22/2017  12:36 PM

## 2017-08-22 NOTE — Discharge Summary (Addendum)
Discharge Summary  Carmen Fisher ENI:[id number] DOB: 1941-03-08  PCP: Carmen Pretty, MD  Admit date: 08/16/2017 Discharge date: 08/22/2017  Time spent: 25 minutes  Recommendations for Outpatient Follow-up:  1. Follow up with urology 2. Follow-up with PCP 3. Take your medication as prescribed  Discharge Diagnoses:  Active Hospital Problems   Diagnosis Date Noted  . Acute pyelonephritis 08/18/2017  . SIRS (systemic inflammatory response syndrome) ([place]) 08/16/2017  . Altered mental status 12/05/2012  . Multiple myeloma ([place]) 11/14/2011    Resolved Hospital Problems  No resolved problems to display.    Discharge Condition: Stable  Diet recommendation: Resume previous diet  Vitals:   08/21/17 2132 08/22/17 0600  BP: (!) 141/84 135/72  Pulse: 99 90  Resp: 18 18  Temp: 99.1 F (37.3 C) 99.3 F (37.4 C)  SpO2: 98% 98%    History of present illness:  77 year old female with history of gout, glaucoma, essential hypertension, multiple myeloma-not on any medications, osteoarthritis came to the hospital for evaluation of fever, change in mental status, nausea and vomiting. Upon admission her UA was positive for urinary tract infection, CT of the head was negative, CT of the abdomen showed 12 mm stone in the right UPJ with hydronephrosis. Patient was initially taken to the ICU for closer monitoring. Urology was consulted who performed cystoscopy with stent placement on 08/16/2017. Initially patient was started on IV Rocephin. Over the course of 24 hours as she did better in the ICU she was transferred out to telemetry for further care and monitoring. Patient's urine culture ended up growing Klebsiella which was sensitive to Rocephin. Her blood culture was also positive for Klebsiella. On 5/20 patient spiked fever therefore she had to be recultured.  08/20/2017: Patient seen and examined at bedside.  She has no new complaints.  Elevated temperature overnight with T-max of 100.1.   White blood cell count remains elevated but trending down.  Continue IV antibiotics and monitor fever curve.  08/21/17: Tmax 100.2 and tachycardia. Spoke with urology who recommended removing foley cath and doing a voiding trial. No new complaints.  08/22/2017: Patient seen and examined at bedside.  Foley catheter was removed yesterday.  Patient has voided with no issues.  On the day of discharge patient was hemodynamically stable.  She will need to follow-up with her urologist, PCP post hospitalization.  Hospital Course:  Principal Problem:   Acute pyelonephritis Active Problems:   Multiple myeloma (HCC)   Altered mental status   SIRS (systemic inflammatory response syndrome) (H[facility])  Sepsis 2/2 to klebsiella UTI complicated by klebsiella pneumoniae bacteremia Tmax 100.2 overnight Wbc 10 from 14k from 22k Repeat CBC in the morning Monitor fever curve Will need 14 days of antibiotics Afebrile with no leukocytosis  Klebsiella pneumoniae bacteremia Positive blood cultures x2 drawn on 08/16/2017 Repeated blood cultures x2 drawn on 08/19/2017 negative to date Continue antibiotic for total of 14 days  Urinary retention Voiding trial and tolerated well Urine output  R hydronephrosis s/p stent placement, stable Follow-up with urology outpatient  Sinus tachycardia, resolved Possibly related to her sepsis  Multiple myeloma, stable Not currently being treated Follow-up with oncology outpatient  Hypokalemia, resolved K+ 3.7  Hypomagnesemia Magnesium 1.8 Repleted with IV magnesium 2 g once  Gout, stable No flares continue allopurinol  GERD, stable continue Protonix p.o.     Procedures: Right ureteral stent placement  Consultations:  Urology  Discharge Exam: BP 135/72 (BP Location: Right Arm)   Pulse 90   Temp 99.3 F (37.4  C)   Resp 18   Ht 5' 1"  (1.549 m)   Wt 78.8 kg (173 lb 11.6 oz)   SpO2 98%   BMI 32.82 kg/m  . General: 77 y.o. year-old female  well developed well nourished in no acute distress.  Alert and oriented x3. . Cardiovascular: Regular rate and rhythm with no rubs or gallops.  No thyromegaly or JVD noted.   Marland Kitchen Respiratory: Clear to auscultation with no wheezes or rales. Good inspiratory effort. . Abdomen: Soft nontender nondistended with normal bowel sounds x4 quadrants. . Musculoskeletal: No lower extremity edema. 2/4 pulses in all 4 extremities. . Skin: No ulcerative lesions noted or rashes, . Psychiatry: Mood is appropriate for condition and setting  Discharge Instructions You were cared for by a hospitalist during your hospital stay. If you have any questions about your discharge medications or the care you received while you were in the hospital after you are discharged, you can call the unit and asked to speak with the hospitalist on call if the hospitalist that took care of you is not available. Once you are discharged, your primary care physician will handle any further medical issues. Please note that NO REFILLS for any discharge medications will be authorized once you are discharged, as it is imperative that you return to your primary care physician (or establish a relationship with a primary care physician if you do not have one) for your aftercare needs so that they can reassess your need for medications and monitor your lab values.   Allergies as of 08/22/2017      Reactions   Aspirin Anaphylaxis   Ampicillin Rash   Aspirin Swelling   Sulfa Antibiotics Hives   Sulfa Antibiotics Swelling   Penicillins Itching, Rash   Has patient had a PCN reaction causing immediate rash, facial/tongue/throat swelling, SOB or lightheadedness with hypotension: Yes Has patient had a PCN reaction causing severe rash involving mucus membranes or skin necrosis: No Has patient had a PCN reaction that required hospitalization: No Has patient had a PCN reaction occurring within the last 10 years: No If all of the above answers are "NO",  then may proceed with Cephalosporin use. Rash and itching Has received Zosyn and Augmentin in th      Medication List    STOP taking these medications   acetaminophen 500 MG tablet Commonly known as:  TYLENOL   Potassium Chloride ER 20 MEQ Tbcr   timolol 0.5 % ophthalmic solution Commonly known as:  BETIMOL     TAKE these medications   allopurinol 300 MG tablet Commonly known as:  ZYLOPRIM TAKE 1 TABLET DAILY   cephALEXin 500 MG capsule Commonly known as:  KEFLEX Take 1 capsule (500 mg total) by mouth every 12 (twelve) hours.   cholecalciferol 1000 units tablet Commonly known as:  VITAMIN D Take 1,000 Units by mouth daily.   Cranberry 250 MG Tabs Take 250 mg by mouth 2 (two) times daily.   diphenoxylate-atropine 2.5-0.025 MG tablet Commonly known as:  LOMOTIL Take 1 tablet by mouth 4 (four) times daily as needed for diarrhea or loose stools.   dorzolamide-timolol 22.3-6.8 MG/ML ophthalmic solution Commonly known as:  COSOPT Place 1 drop into both eyes 2 (two) times daily.   gabapentin 600 MG tablet Commonly known as:  NEURONTIN Take 1 tablet (600 mg total) by mouth 2 (two) times daily.   KLOR-CON M20 20 MEQ tablet Generic drug:  potassium chloride SA TAKE 1 AND 1/2 TABLETS DAILY   latanoprost  0.005 % ophthalmic solution Commonly known as:  XALATAN Place 1 drop into both eyes at bedtime.   loperamide 2 MG capsule Commonly known as:  IMODIUM TAKE 1 CAPSULE AS NEEDED   FOR DIARRHEA OR LOOSE STOOL(TAKE UP TO 6 CAPSULES     DAILY)   LORazepam 1 MG tablet Commonly known as:  ATIVAN Take 1 mg by mouth every 4 (four) hours as needed (neuropathy). Reported on 09/14/2015   multivitamin with minerals Tabs tablet Take 1 tablet by mouth daily.   pantoprazole 40 MG tablet Commonly known as:  PROTONIX Take 1 tablet (40 mg total) by mouth daily.   prochlorperazine 10 MG tablet Commonly known as:  COMPAZINE Take 10 mg by mouth every 6 (six) hours as needed.  Reported on 09/14/2015      Allergies  Allergen Reactions  . Aspirin Anaphylaxis  . Ampicillin Rash  . Aspirin Swelling  . Sulfa Antibiotics Hives  . Sulfa Antibiotics Swelling  . Penicillins Itching and Rash    Has patient had a PCN reaction causing immediate rash, facial/tongue/throat swelling, SOB or lightheadedness with hypotension: Yes Has patient had a PCN reaction causing severe rash involving mucus membranes or skin necrosis: No Has patient had a PCN reaction that required hospitalization: No Has patient had a PCN reaction occurring within the last 10 years: No If all of the above answers are "NO", then may proceed with Cephalosporin use.   Rash and itching Has received Zosyn and Augmentin in th   Follow-up Information    Call [facility].   Why:  For an appointment in 1-2 weeks when you get home. Contact information: [place] [phone]       [facility] Follow up.   Specialty:  [place] Why:  home health services arranged Contact information: [place] [address] [phone]        Carmen Pretty, MD Follow up in 3 day(s).   Specialty:  Internal Medicine Why:  Please call to make an appointment. Contact information: [address] [place] [address] [phone]            The results of significant diagnostics from this hospitalization (including imaging, microbiology, ancillary and laboratory) are listed below for reference.    Significant Diagnostic Studies: Ct Abdomen Pelvis Wo Contrast  Result Date: 08/16/2017 CLINICAL DATA:  Abdominal distention, nausea, vomiting EXAM: CT ABDOMEN AND PELVIS WITHOUT CONTRAST TECHNIQUE: Multidetector CT imaging of the abdomen and pelvis was performed following the standard protocol without IV contrast. COMPARISON:  Ultrasound 12/04/2016 FINDINGS: Lower chest: Bibasilar  atelectasis.  Heart is normal size. Hepatobiliary: 2.2 cm low-density lesion in the right hepatic lobe peripherally likely corresponds to a cyst seen on prior ultrasound. Gallbladder grossly unremarkable. No biliary ductal dilatation. Pancreas: No focal abnormality or ductal dilatation. Spleen: No focal abnormality.  Normal size. Adrenals/Urinary Tract: Extensive bilateral perinephric stranding, right greater than left. Mild right hydronephrosis. 12 mm stone in the region of the right UPJ. Calcifications noted inferiorly could reflect small ureteral stones. The ureter is difficult to visualize and follow due to the extensive perinephric stranding which extends inferiorly around the ureter. No stones or hydronephrosis on the left. Adrenal glands and urinary bladder unremarkable. Small amount of gas in the urinary bladder presumably from catheterization. Stomach/Bowel: Stomach, large and small bowel grossly unremarkable. Vascular/Lymphatic: Aortic atherosclerosis. No enlarged abdominal or pelvic lymph nodes. Reproductive: Prior hysterectomy.  No  adnexal masses. Other: No free fluid or free air. Musculoskeletal: No acute bony abnormality. IMPRESSION: Extensive bilateral perinephric stranding, right greater than left. There is mild right hydronephrosis with 12 mm right UPJ stone. Other calcifications noted inferiorly could be ureteral stones on the right but difficult to follow the right ureter with the extensive perinephric and peri ureteral stranding. Aortic atherosclerosis. Bibasilar atelectasis. Electronically Signed   By: Rolm Baptise [name]   On: 08/16/2017 16:47   Ct Head Wo Contrast  Result Date: 08/16/2017 CLINICAL DATA:  77 year old female with a history of nausea and vomiting EXAM: CT HEAD WITHOUT CONTRAST TECHNIQUE: Contiguous axial images were obtained from the base of the skull through the vertex without intravenous contrast. COMPARISON:  None. FINDINGS: Brain: No acute intracranial hemorrhage. No  midline shift or mass effect. Gray-white differentiation maintained. Focal hypodensity lateral to the right caudate. Unremarkable appearance of the ventricular system. Vascular: Unremarkable. Skull: No acute fracture.  No aggressive bone lesion identified. Sinuses/Orbits: Unremarkable appearance of the orbits. Trace mucosal disease of the left frontal sinus. Other: None IMPRESSION: Negative for acute intracranial abnormality. Focal hypodensity lateral to the right caudate may represent dilated perivascular space or prior lacunar infarction. Electronically Signed   By: Corrie Mckusick D.O.   On: 08/16/2017 14:25   Dg Cystogram  Result Date: 08/18/2017 CLINICAL DATA:  Hydronephrosis with right ureteral calculus EXAM: RETROGRADE PYELOGRAM FROM OR FLUOROSCOPY TIME:  Fluoroscopy Time: 0 minutes 22 seconds Number of Acquired Spot Images: 1 COMPARISON:  CT abdomen and pelvis Aug 16, 2017 FINDINGS: Spot image of the right kidney shows moderate hydronephrosis on the right with filling defect in the right renal pelvis, possibly a degree of hemorrhage. A double-J stent extends into this area. No calculus is evident on submitted image. IMPRESSION: Double-J stent extending in the right renal pelvis. Fullness of the calices on the right noted. Lucency in the right renal pelvis potentially could represent a degree of hemorrhage. Electronically Signed   By: Lowella Grip III [name]   On: 08/18/2017 07:41   Dg Chest Port 1 View  Result Date: 08/16/2017 CLINICAL DATA:  77 year old female with a history of shortness of breath and fever EXAM: PORTABLE CHEST 1 VIEW COMPARISON:  03/21/2010, 07/31/2009 FINDINGS: Cardiomediastinal silhouette accentuated by low lung volumes. Linear opacities at the lung bases. Right rotation somewhat limits evaluation. Accentuated mediastinal borders. No pneumothorax or pleural effusion. No large confluent airspace disease. No displaced fracture IMPRESSION: The accentuated heart borders are favored  to represent low lung volumes and right rotation, however, mediastinal adenopathy is not excluded on this plain film. A formal PA and lateral chest x-ray may be useful. Likely atelectasis/consolidation at the lung bases. Electronically Signed   By: Corrie Mckusick D.O.   On: 08/16/2017 13:28    Microbiology: Recent Results (from the past 240 hour(s))  Blood Culture (routine x 2)     Status: Abnormal   Collection Time: 08/16/17 12:10 PM  Result Value Ref Range Status   Specimen Description BLOOD LEFT HAND  Final   Special Requests   Final    BOTTLES DRAWN AEROBIC ONLY Blood Culture results may not be optimal due to an inadequate volume of blood received in culture bottles   Culture  Setup Time   Final    GRAM NEGATIVE RODS AEROBIC BOTTLE ONLY CRITICAL VALUE NOTED.  VALUE IS CONSISTENT WITH PREVIOUSLY REPORTED AND CALLED VALUE.    Culture (A)  Final    KLEBSIELLA PNEUMONIAE SUSCEPTIBILITIES PERFORMED ON PREVIOUS CULTURE WITHIN  THE LAST 5 DAYS. Performed at [facility], [place] [address], [place], [place] [address]    Report Status 08/19/2017 FINAL  Final  Blood Culture (routine x 2)     Status: Abnormal   Collection Time: 08/16/17 12:20 PM  Result Value Ref Range Status   Specimen Description BLOOD LEFT ANTECUBITAL  Final   Special Requests   Final    BOTTLES DRAWN AEROBIC AND ANAEROBIC Blood Culture adequate volume Performed at [facility], [place] [address] [place], [place] [address]    Culture  Setup Time   Final    GRAM NEGATIVE RODS IN BOTH AEROBIC AND ANAEROBIC BOTTLES CRITICAL RESULT CALLED TO, READ BACK BY AND VERIFIED WITH: L. SEAY,PHARMD 0126 08/17/2017 T. TYSOR    Culture KLEBSIELLA PNEUMONIAE (A)  Final   Report Status 08/19/2017 FINAL  Final   Organism ID, Bacteria KLEBSIELLA PNEUMONIAE  Final      Susceptibility   Klebsiella pneumoniae - MIC*    AMPICILLIN >=32 RESISTANT Resistant     CEFAZOLIN <=4 SENSITIVE Sensitive     CEFEPIME <=1 SENSITIVE Sensitive       CEFTAZIDIME <=1 SENSITIVE Sensitive     CEFTRIAXONE <=1 SENSITIVE Sensitive     CIPROFLOXACIN <=0.25 SENSITIVE Sensitive     GENTAMICIN <=1 SENSITIVE Sensitive     IMIPENEM <=0.25 SENSITIVE Sensitive     TRIMETH/SULFA <=20 SENSITIVE Sensitive     AMPICILLIN/SULBACTAM 4 SENSITIVE Sensitive     PIP/TAZO <=4 SENSITIVE Sensitive     Extended ESBL NEGATIVE Sensitive     * KLEBSIELLA PNEUMONIAE  Blood Culture ID Panel (Reflexed)     Status: Abnormal   Collection Time: 08/16/17 12:20 PM  Result Value Ref Range Status   Enterococcus species NOT DETECTED NOT DETECTED Final   Listeria monocytogenes NOT DETECTED NOT DETECTED Final   Staphylococcus species NOT DETECTED NOT DETECTED Final   Staphylococcus aureus NOT DETECTED NOT DETECTED Final   Streptococcus species NOT DETECTED NOT DETECTED Final   Streptococcus agalactiae NOT DETECTED NOT DETECTED Final   Streptococcus pneumoniae NOT DETECTED NOT DETECTED Final   Streptococcus pyogenes NOT DETECTED NOT DETECTED Final   Acinetobacter baumannii NOT DETECTED NOT DETECTED Final   Enterobacteriaceae species DETECTED (A) NOT DETECTED Final    Comment: Enterobacteriaceae represent a large family of gram-negative bacteria, not a single organism. CRITICAL RESULT CALLED TO, READ BACK BY AND VERIFIED WITH: L. SEAY,PHARMD 0126 08/17/2017 T. TYSOR    Enterobacter cloacae complex NOT DETECTED NOT DETECTED Final   Escherichia coli NOT DETECTED NOT DETECTED Final   Klebsiella oxytoca NOT DETECTED NOT DETECTED Final   Klebsiella pneumoniae DETECTED (A) NOT DETECTED Final    Comment: CRITICAL RESULT CALLED TO, READ BACK BY AND VERIFIED WITH: L. SEAY,PHARMD 0126 08/17/2017 T. TYSOR    Proteus species NOT DETECTED NOT DETECTED Final   Serratia marcescens NOT DETECTED NOT DETECTED Final   Carbapenem resistance NOT DETECTED NOT DETECTED Final   Haemophilus influenzae NOT DETECTED NOT DETECTED Final   Neisseria meningitidis NOT DETECTED NOT DETECTED Final    Pseudomonas aeruginosa NOT DETECTED NOT DETECTED Final   Candida albicans NOT DETECTED NOT DETECTED Final   Candida glabrata NOT DETECTED NOT DETECTED Final   Candida krusei NOT DETECTED NOT DETECTED Final   Candida parapsilosis NOT DETECTED NOT DETECTED Final   Candida tropicalis NOT DETECTED NOT DETECTED Final  Urine culture     Status: Abnormal   Collection Time: 08/16/17 12:50 PM  Result Value Ref Range Status  Specimen Description URINE, CATHETERIZED  Final   Special Requests   Final    NONE Performed at [facility], [place] [address] [place], [place] [address]    Culture >=100,000 COLONIES/mL KLEBSIELLA PNEUMONIAE (A)  Final   Report Status 08/18/2017 FINAL  Final   Organism ID, Bacteria KLEBSIELLA PNEUMONIAE (A)  Final      Susceptibility   Klebsiella pneumoniae - MIC*    AMPICILLIN RESISTANT Resistant     CEFAZOLIN <=4 SENSITIVE Sensitive     CEFTRIAXONE <=1 SENSITIVE Sensitive     CIPROFLOXACIN <=0.25 SENSITIVE Sensitive     GENTAMICIN <=1 SENSITIVE Sensitive     IMIPENEM <=0.25 SENSITIVE Sensitive     NITROFURANTOIN 32 SENSITIVE Sensitive     TRIMETH/SULFA <=20 SENSITIVE Sensitive     AMPICILLIN/SULBACTAM <=2 SENSITIVE Sensitive     PIP/TAZO <=4 SENSITIVE Sensitive     Extended ESBL NEGATIVE Sensitive     * >=100,000 COLONIES/mL KLEBSIELLA PNEUMONIAE  Anaerobic culture     Status: None   Collection Time: 08/16/17  6:09 PM  Result Value Ref Range Status   Specimen Description CYSTOSCOPY RIGHT RENAL PELVIS  Final   Special Requests SPEC A  Final   Gram Stain   Final    ABUNDANT WBC PRESENT, PREDOMINANTLY PMN ABUNDANT GRAM NEGATIVE RODS    Culture   Final    NO ANAEROBES ISOLATED Performed at [facility], [address] [place], [place] [address]    Report Status 08/21/2017 FINAL  Final  Urine Culture     Status: Abnormal   Collection Time: 08/16/17  6:09 PM  Result Value Ref Range Status   Specimen Description CYSTOSCOPY RIGHT RENAL PELVIS   Final   Special Requests SPEC A  Final   Culture >=100,000 COLONIES/mL KLEBSIELLA PNEUMONIAE (A)  Final   Report Status 08/18/2017 FINAL  Final   Organism ID, Bacteria KLEBSIELLA PNEUMONIAE (A)  Final      Susceptibility   Klebsiella pneumoniae - MIC*    AMPICILLIN RESISTANT Resistant     CEFAZOLIN <=4 SENSITIVE Sensitive     CEFEPIME <=1 SENSITIVE Sensitive     CEFTAZIDIME <=1 SENSITIVE Sensitive     CEFTRIAXONE <=1 SENSITIVE Sensitive     CIPROFLOXACIN <=0.25 SENSITIVE Sensitive     GENTAMICIN <=1 SENSITIVE Sensitive     IMIPENEM <=0.25 SENSITIVE Sensitive     TRIMETH/SULFA <=20 SENSITIVE Sensitive     AMPICILLIN/SULBACTAM 4 SENSITIVE Sensitive     PIP/TAZO <=4 SENSITIVE Sensitive     Extended ESBL NEGATIVE Sensitive     * >=100,000 COLONIES/mL KLEBSIELLA PNEUMONIAE  MRSA PCR Screening     Status: None   Collection Time: 08/16/17  8:49 PM  Result Value Ref Range Status   MRSA by PCR NEGATIVE NEGATIVE Final    Comment:        The GeneXpert MRSA Assay (FDA approved for NASAL specimens only), is one component of a comprehensive MRSA colonization surveillance program. It is not intended to diagnose MRSA infection nor to guide or monitor treatment for MRSA infections. Performed at [facility], [place] [address] [place], [place] [address]   Culture, blood (Routine X 2) w Reflex to ID Panel     Status: None (Preliminary result)   Collection Time: 08/19/17  8:20 AM  Result Value Ref Range Status   Specimen Description BLOOD RIGHT HAND  Final   Special Requests   Final    BOTTLES DRAWN AEROBIC ONLY Blood Culture adequate volume  Culture   Final    NO GROWTH 2 DAYS Performed at [facility], [place] [address] [place], [place] [address]    Report Status PENDING  Incomplete  Culture, blood (Routine X 2) w Reflex to ID Panel     Status: None (Preliminary result)   Collection Time: 08/19/17  8:22 AM  Result Value Ref Range Status   Specimen Description BLOOD RIGHT HAND   Final   Special Requests   Final    BOTTLES DRAWN AEROBIC ONLY Blood Culture adequate volume   Culture   Final    NO GROWTH 2 DAYS Performed at [facility], [place] [address], [place], [place] [address]    Report Status PENDING  Incomplete     Labs: Basic Metabolic Panel: Recent Labs  Lab 08/17/17 0326 08/18/17 0506 08/19/17 0817 08/20/17 0418 08/21/17 0305 08/22/17 0502  NA 143 141 142 143 142 142  K 3.6 3.2* 3.2* 3.4* 3.7 3.3*  CL 112* 109 110 110 109 107  CO2 19* 20* 23 25 25 25   GLUCOSE 113* 102* 106* 113* 107* 111*  BUN 15 10 6 10 13 14   CREATININE 1.47* 1.02* 0.83 0.90 0.96 0.91  CALCIUM 7.0* 7.4* 8.7* 8.9 9.2 8.9  MG 1.1* 2.4 1.9 1.8 1.8 1.8  PHOS 3.3 1.9*  --   --   --   --    Liver Function Tests: Recent Labs  Lab 08/16/17 1415  AST 33  ALT 18  ALKPHOS 50  BILITOT 1.9*  PROT 6.2*  ALBUMIN 3.2*   Recent Labs  Lab 08/16/17 1415  LIPASE 29   No results for input(s): AMMONIA in the last 168 hours. CBC: Recent Labs  Lab 08/16/17 1220  08/18/17 0506 08/19/17 0817 08/20/17 0418 08/21/17 0305 08/22/17 0502  WBC 3.7*   < > 18.5* 22.1* 14.1* 10.8* 10.4  NEUTROABS 3.4  --   --   --   --   --   --   HGB 12.8   < > 9.7* 10.6* 10.6* 10.6* 10.0*  HCT 39.8   < > 29.8* 33.0* 33.0* 32.8* 30.8*  MCV 100.5*   < > 99.0 98.2 97.1 97.6 97.2  PLT 133*   < > 86* 109* 119* 147* 170   < > = values in this interval not displayed.   Cardiac Enzymes: No results for input(s): CKTOTAL, CKMB, CKMBINDEX, TROPONINI in the last 168 hours. BNP: BNP (last 3 results) No results for input(s): BNP in the last 8760 hours.  ProBNP (last 3 results) No results for input(s): PROBNP in the last 8760 hours.  CBG: Recent Labs  Lab 08/16/17 2023 08/17/17 0116  GLUCAP 78 95       Signed:  Kayleen Memos, MD [facility] 08/22/2017, 12:06 PM

## 2017-08-24 DIAGNOSIS — M1990 Unspecified osteoarthritis, unspecified site: Secondary | ICD-10-CM | POA: Diagnosis not present

## 2017-08-24 DIAGNOSIS — G629 Polyneuropathy, unspecified: Secondary | ICD-10-CM | POA: Diagnosis not present

## 2017-08-24 DIAGNOSIS — M810 Age-related osteoporosis without current pathological fracture: Secondary | ICD-10-CM | POA: Diagnosis not present

## 2017-08-24 DIAGNOSIS — C9000 Multiple myeloma not having achieved remission: Secondary | ICD-10-CM | POA: Diagnosis not present

## 2017-08-24 DIAGNOSIS — B961 Klebsiella pneumoniae [K. pneumoniae] as the cause of diseases classified elsewhere: Secondary | ICD-10-CM | POA: Diagnosis not present

## 2017-08-24 DIAGNOSIS — K219 Gastro-esophageal reflux disease without esophagitis: Secondary | ICD-10-CM | POA: Diagnosis not present

## 2017-08-24 DIAGNOSIS — D649 Anemia, unspecified: Secondary | ICD-10-CM | POA: Diagnosis not present

## 2017-08-24 DIAGNOSIS — M109 Gout, unspecified: Secondary | ICD-10-CM | POA: Diagnosis not present

## 2017-08-24 DIAGNOSIS — I10 Essential (primary) hypertension: Secondary | ICD-10-CM | POA: Diagnosis not present

## 2017-08-24 DIAGNOSIS — N136 Pyonephrosis: Secondary | ICD-10-CM | POA: Diagnosis not present

## 2017-08-24 LAB — CULTURE, BLOOD (ROUTINE X 2)
Culture: NO GROWTH
Culture: NO GROWTH
Special Requests: ADEQUATE
Special Requests: ADEQUATE

## 2017-08-26 DIAGNOSIS — Z09 Encounter for follow-up examination after completed treatment for conditions other than malignant neoplasm: Secondary | ICD-10-CM | POA: Diagnosis not present

## 2017-08-26 DIAGNOSIS — N200 Calculus of kidney: Secondary | ICD-10-CM | POA: Diagnosis not present

## 2017-08-26 DIAGNOSIS — R030 Elevated blood-pressure reading, without diagnosis of hypertension: Secondary | ICD-10-CM | POA: Diagnosis not present

## 2017-08-27 DIAGNOSIS — B961 Klebsiella pneumoniae [K. pneumoniae] as the cause of diseases classified elsewhere: Secondary | ICD-10-CM | POA: Diagnosis not present

## 2017-08-27 DIAGNOSIS — I10 Essential (primary) hypertension: Secondary | ICD-10-CM | POA: Diagnosis not present

## 2017-08-27 DIAGNOSIS — K219 Gastro-esophageal reflux disease without esophagitis: Secondary | ICD-10-CM | POA: Diagnosis not present

## 2017-08-27 DIAGNOSIS — M109 Gout, unspecified: Secondary | ICD-10-CM | POA: Diagnosis not present

## 2017-08-27 DIAGNOSIS — G629 Polyneuropathy, unspecified: Secondary | ICD-10-CM | POA: Diagnosis not present

## 2017-08-27 DIAGNOSIS — M810 Age-related osteoporosis without current pathological fracture: Secondary | ICD-10-CM | POA: Diagnosis not present

## 2017-08-27 DIAGNOSIS — N136 Pyonephrosis: Secondary | ICD-10-CM | POA: Diagnosis not present

## 2017-08-27 DIAGNOSIS — M1990 Unspecified osteoarthritis, unspecified site: Secondary | ICD-10-CM | POA: Diagnosis not present

## 2017-08-27 DIAGNOSIS — C9000 Multiple myeloma not having achieved remission: Secondary | ICD-10-CM | POA: Diagnosis not present

## 2017-08-27 DIAGNOSIS — D649 Anemia, unspecified: Secondary | ICD-10-CM | POA: Diagnosis not present

## 2017-08-28 DIAGNOSIS — A419 Sepsis, unspecified organism: Secondary | ICD-10-CM | POA: Diagnosis not present

## 2017-08-28 DIAGNOSIS — R000 Tachycardia, unspecified: Secondary | ICD-10-CM | POA: Diagnosis not present

## 2017-08-28 DIAGNOSIS — N10 Acute tubulo-interstitial nephritis: Secondary | ICD-10-CM | POA: Diagnosis not present

## 2017-08-28 DIAGNOSIS — R339 Retention of urine, unspecified: Secondary | ICD-10-CM | POA: Diagnosis not present

## 2017-08-28 DIAGNOSIS — N1330 Unspecified hydronephrosis: Secondary | ICD-10-CM | POA: Diagnosis not present

## 2017-08-28 DIAGNOSIS — Z09 Encounter for follow-up examination after completed treatment for conditions other than malignant neoplasm: Secondary | ICD-10-CM | POA: Diagnosis not present

## 2017-08-28 DIAGNOSIS — E876 Hypokalemia: Secondary | ICD-10-CM | POA: Diagnosis not present

## 2017-08-28 DIAGNOSIS — B961 Klebsiella pneumoniae [K. pneumoniae] as the cause of diseases classified elsewhere: Secondary | ICD-10-CM | POA: Diagnosis not present

## 2017-08-28 DIAGNOSIS — C9000 Multiple myeloma not having achieved remission: Secondary | ICD-10-CM | POA: Diagnosis not present

## 2017-08-28 DIAGNOSIS — N390 Urinary tract infection, site not specified: Secondary | ICD-10-CM | POA: Diagnosis not present

## 2017-08-29 DIAGNOSIS — I10 Essential (primary) hypertension: Secondary | ICD-10-CM | POA: Diagnosis not present

## 2017-08-29 DIAGNOSIS — C9000 Multiple myeloma not having achieved remission: Secondary | ICD-10-CM | POA: Diagnosis not present

## 2017-08-29 DIAGNOSIS — D649 Anemia, unspecified: Secondary | ICD-10-CM | POA: Diagnosis not present

## 2017-08-29 DIAGNOSIS — M109 Gout, unspecified: Secondary | ICD-10-CM | POA: Diagnosis not present

## 2017-08-29 DIAGNOSIS — G629 Polyneuropathy, unspecified: Secondary | ICD-10-CM | POA: Diagnosis not present

## 2017-08-29 DIAGNOSIS — M1990 Unspecified osteoarthritis, unspecified site: Secondary | ICD-10-CM | POA: Diagnosis not present

## 2017-08-29 DIAGNOSIS — B961 Klebsiella pneumoniae [K. pneumoniae] as the cause of diseases classified elsewhere: Secondary | ICD-10-CM | POA: Diagnosis not present

## 2017-08-29 DIAGNOSIS — K219 Gastro-esophageal reflux disease without esophagitis: Secondary | ICD-10-CM | POA: Diagnosis not present

## 2017-08-29 DIAGNOSIS — M810 Age-related osteoporosis without current pathological fracture: Secondary | ICD-10-CM | POA: Diagnosis not present

## 2017-08-29 DIAGNOSIS — N136 Pyonephrosis: Secondary | ICD-10-CM | POA: Diagnosis not present

## 2017-09-02 DIAGNOSIS — M109 Gout, unspecified: Secondary | ICD-10-CM | POA: Diagnosis not present

## 2017-09-02 DIAGNOSIS — B961 Klebsiella pneumoniae [K. pneumoniae] as the cause of diseases classified elsewhere: Secondary | ICD-10-CM | POA: Diagnosis not present

## 2017-09-02 DIAGNOSIS — I10 Essential (primary) hypertension: Secondary | ICD-10-CM | POA: Diagnosis not present

## 2017-09-02 DIAGNOSIS — C9000 Multiple myeloma not having achieved remission: Secondary | ICD-10-CM | POA: Diagnosis not present

## 2017-09-02 DIAGNOSIS — K219 Gastro-esophageal reflux disease without esophagitis: Secondary | ICD-10-CM | POA: Diagnosis not present

## 2017-09-02 DIAGNOSIS — N136 Pyonephrosis: Secondary | ICD-10-CM | POA: Diagnosis not present

## 2017-09-02 DIAGNOSIS — G629 Polyneuropathy, unspecified: Secondary | ICD-10-CM | POA: Diagnosis not present

## 2017-09-02 DIAGNOSIS — M1990 Unspecified osteoarthritis, unspecified site: Secondary | ICD-10-CM | POA: Diagnosis not present

## 2017-09-02 DIAGNOSIS — M810 Age-related osteoporosis without current pathological fracture: Secondary | ICD-10-CM | POA: Diagnosis not present

## 2017-09-02 DIAGNOSIS — D649 Anemia, unspecified: Secondary | ICD-10-CM | POA: Diagnosis not present

## 2017-09-03 DIAGNOSIS — K8020 Calculus of gallbladder without cholecystitis without obstruction: Secondary | ICD-10-CM | POA: Diagnosis not present

## 2017-09-03 DIAGNOSIS — K2270 Barrett's esophagus without dysplasia: Secondary | ICD-10-CM | POA: Diagnosis not present

## 2017-09-03 DIAGNOSIS — N201 Calculus of ureter: Secondary | ICD-10-CM | POA: Diagnosis not present

## 2017-09-03 DIAGNOSIS — N390 Urinary tract infection, site not specified: Secondary | ICD-10-CM | POA: Diagnosis not present

## 2017-09-03 DIAGNOSIS — K219 Gastro-esophageal reflux disease without esophagitis: Secondary | ICD-10-CM | POA: Diagnosis not present

## 2017-09-04 ENCOUNTER — Other Ambulatory Visit: Payer: Self-pay | Admitting: Urology

## 2017-09-04 ENCOUNTER — Inpatient Hospital Stay: Payer: Medicare HMO | Attending: Internal Medicine

## 2017-09-04 DIAGNOSIS — D649 Anemia, unspecified: Secondary | ICD-10-CM | POA: Diagnosis not present

## 2017-09-04 DIAGNOSIS — N136 Pyonephrosis: Secondary | ICD-10-CM | POA: Diagnosis not present

## 2017-09-04 DIAGNOSIS — R5383 Other fatigue: Secondary | ICD-10-CM | POA: Diagnosis not present

## 2017-09-04 DIAGNOSIS — G629 Polyneuropathy, unspecified: Secondary | ICD-10-CM | POA: Diagnosis not present

## 2017-09-04 DIAGNOSIS — M109 Gout, unspecified: Secondary | ICD-10-CM | POA: Diagnosis not present

## 2017-09-04 DIAGNOSIS — C9000 Multiple myeloma not having achieved remission: Secondary | ICD-10-CM | POA: Insufficient documentation

## 2017-09-04 DIAGNOSIS — M810 Age-related osteoporosis without current pathological fracture: Secondary | ICD-10-CM | POA: Diagnosis not present

## 2017-09-04 DIAGNOSIS — B961 Klebsiella pneumoniae [K. pneumoniae] as the cause of diseases classified elsewhere: Secondary | ICD-10-CM | POA: Diagnosis not present

## 2017-09-04 DIAGNOSIS — K219 Gastro-esophageal reflux disease without esophagitis: Secondary | ICD-10-CM | POA: Diagnosis not present

## 2017-09-04 DIAGNOSIS — I10 Essential (primary) hypertension: Secondary | ICD-10-CM | POA: Diagnosis not present

## 2017-09-04 DIAGNOSIS — M1990 Unspecified osteoarthritis, unspecified site: Secondary | ICD-10-CM | POA: Diagnosis not present

## 2017-09-04 LAB — CBC WITH DIFFERENTIAL (CANCER CENTER ONLY)
Basophils Absolute: 0 10*3/uL (ref 0.0–0.1)
Basophils Relative: 1 %
Eosinophils Absolute: 0.1 10*3/uL (ref 0.0–0.5)
Eosinophils Relative: 1 %
HCT: 31.3 % — ABNORMAL LOW (ref 34.8–46.6)
Hemoglobin: 10.3 g/dL — ABNORMAL LOW (ref 11.6–15.9)
Lymphocytes Relative: 32 %
Lymphs Abs: 1.5 10*3/uL (ref 0.9–3.3)
MCH: 32.4 pg (ref 25.1–34.0)
MCHC: 32.9 g/dL (ref 31.5–36.0)
MCV: 98.5 fL (ref 79.5–101.0)
Monocytes Absolute: 0.4 10*3/uL (ref 0.1–0.9)
Monocytes Relative: 10 %
Neutro Abs: 2.6 10*3/uL (ref 1.5–6.5)
Neutrophils Relative %: 56 %
Platelet Count: 321 10*3/uL (ref 145–400)
RBC: 3.18 MIL/uL — ABNORMAL LOW (ref 3.70–5.45)
RDW: 14.6 % — ABNORMAL HIGH (ref 11.2–14.5)
WBC Count: 4.6 10*3/uL (ref 3.9–10.3)

## 2017-09-04 LAB — CMP (CANCER CENTER ONLY)
ALT: 10 U/L (ref 0–55)
AST: 17 U/L (ref 5–34)
Albumin: 3.7 g/dL (ref 3.5–5.0)
Alkaline Phosphatase: 84 U/L (ref 40–150)
Anion gap: 6 (ref 3–11)
BUN: 13 mg/dL (ref 7–26)
CO2: 28 mmol/L (ref 22–29)
Calcium: 9.4 mg/dL (ref 8.4–10.4)
Chloride: 109 mmol/L (ref 98–109)
Creatinine: 0.95 mg/dL (ref 0.60–1.10)
GFR, Est AFR Am: >60 mL/min (ref >60)
GFR, Estimated: 57 mL/min — ABNORMAL LOW (ref >60)
Glucose, Bld: 98 mg/dL (ref 70–140)
Potassium: 3.6 mmol/L (ref 3.5–5.1)
Sodium: 143 mmol/L (ref 136–145)
Total Bilirubin: 0.3 mg/dL (ref 0.2–1.2)
Total Protein: 7.8 g/dL (ref 6.4–8.3)

## 2017-09-04 LAB — LACTATE DEHYDROGENASE: LDH: 174 U/L (ref 125–245)

## 2017-09-05 ENCOUNTER — Encounter (HOSPITAL_BASED_OUTPATIENT_CLINIC_OR_DEPARTMENT_OTHER): Payer: Self-pay | Admitting: *Deleted

## 2017-09-05 ENCOUNTER — Other Ambulatory Visit: Payer: Self-pay

## 2017-09-05 LAB — IGG, IGA, IGM
IgA: 377 mg/dL (ref 64–422)
IgG (Immunoglobin G), Serum: 1564 mg/dL (ref 700–1600)
IgM (Immunoglobulin M), Srm: 24 mg/dL — ABNORMAL LOW (ref 26–217)

## 2017-09-05 LAB — KAPPA/LAMBDA LIGHT CHAINS
Kappa free light chain: 51.7 mg/L — ABNORMAL HIGH (ref 3.3–19.4)
Kappa, lambda light chain ratio: 2.55 — ABNORMAL HIGH (ref 0.26–1.65)
Lambda free light chains: 20.3 mg/L (ref 5.7–26.3)

## 2017-09-05 LAB — BETA 2 MICROGLOBULIN, SERUM: Beta-2 Microglobulin: 1.5 mg/L (ref 0.6–2.4)

## 2017-09-05 NOTE — Progress Notes (Signed)
Spoke w/ pt via phone for pre-op interview.  Npo after mn.  Arrive at 0530.  Current lab results, dated 08-17-2017, and ekg in chart and epic.  Will take gabapentin, protonix, and allopurinol am dos w/ sips of water.

## 2017-09-06 NOTE — H&P (Signed)
HPI:  This is a 77 y.o. female with hx of MM recently on therapy, HTN, and GERD who presented with AMS and found to have a right obstructing stone. CT revealed bilateral perinephric stranding. Mild right hydronephrosis. Difficult to follow the ureter, but likely 2-3 obstructing stones in the proximal to mid right ureter with a large 70m stone potentially ball valving the UPJ.  She underwent stenting and antibiotic therapy.  She was recently seen in the office and was doing well and tolerating her stent fairly well with minimal discomfort in her flank.   Past Medical History: Past Medical History: Diagnosis Date . Anemia, unspecified  . Cataracts, bilateral  . Chronic anticoagulation  . Diarrhea 03/14/2016 . Dyslipidemia  . GERD (gastroesophageal reflux disease)  . Glaucoma  . Glaucoma  . Gout  . [place]  . Hypertension  . Hypokalemia  . [place]  . Multiple myeloma, without mention of having achieved remission  . Osteoarthritis  . Osteoporosis  . Peripheral neuropathy    Past Surgical History:  Past Surgical History: Procedure Laterality Date . ABDOMINAL HYSTERECTOMY  1994  w/BSO . APPENDECTOMY   . BONE MARROW TRANSPLANT     MEDICATIONS: Allopurinol 300 mg tablet  Cholecalciferol  Cranberry 250 mg capsule  Diphenoxylate-Atropine 2.5 mg-0.025 mg tablet  Dorzolamide-Timolol 22.3 mg-6.8 mg/ml drops  Gabapentin 600 mg tablet  Klor-Con M20  Latanoprost 0.005 % drops  Loperamide 2 mg tablet  Lorazepam 1 mg tablet  Multivitamin  Pantoprazole Sodium 40 mg tablet, delayed release  Prochlorperazine      Allergies: Allergies Allergen Reactions . Aspirin Anaphylaxis . Ampicillin Rash . Aspirin Swelling . Sulfa Antibiotics Hives . Sulfa Antibiotics Swelling . Penicillins Itching and Rash   Has patient had a PCN reaction causing immediate rash, facial/tongue/throat swelling, SOB or  lightheadedness with hypotension: Yes Has patient had a PCN reaction causing severe rash involving mucus membranes or skin necrosis: No Has patient had a PCN reaction that required hospitalization: No Has patient had a PCN reaction occurring within the last 10 years: No If all of the above answers are "NO", then may proceed with Cephalosporin use.   Rash and itching Has received Zosyn and Augmentin in th   Social History: Social History  Tobacco Use . Smoking status: Never Smoker . Smokeless tobacco: Never Used Substance Use Topics . Alcohol use: No   Comment: Occasionally . Drug use: No   Family History Family History Problem Relation Age of Onset . Stroke Mother  . Hypertension Mother  . Cancer Father       throat . Cancer Sister       breast . Sickle cell trait Sister  . Hypertension Unknown    REVIEW OF SYSTEMS:    GU Review Female:   Patient reports frequent urination. Patient denies hard to postpone urination, burning /pain with urination, get up at night to urinate, leakage of urine, stream starts and stops, trouble starting your stream, have to strain to urinate, and being pregnant.  Gastrointestinal (Upper):   Patient denies nausea, vomiting, and indigestion/ heartburn.  Gastrointestinal (Lower):   Patient denies diarrhea and constipation.  Constitutional:   Patient denies fever, night sweats, weight loss, and fatigue.  Skin:   Patient denies skin rash/ lesion and itching.  Eyes:   Patient denies blurred vision and double vision.  Ears/ Nose/ Throat:   Patient denies sore throat and sinus problems.  Hematologic/Lymphatic:   Patient denies swollen glands and easy bruising.  Cardiovascular:   Patient denies leg swelling and chest pains.  Respiratory:   Patient denies shortness of breath and cough.  Endocrine:   Patient denies excessive thirst.  Musculoskeletal:   Patient reports back pain. Patient denies joint pain.  Neurological:   Patient  denies headaches and dizziness.  Psychologic:   Patient denies depression and anxiety.     VITAL SIGNS:    BP 131/76 mmHg  Pulse 72 /min  Temperature 97.8 F / 36.5 C    MULTI-SYSTEM PHYSICAL EXAMINATION:    Constitutional: Well-nourished. No physical deformities. Normally developed. Good grooming.   Respiratory: Normal breath sounds. No labored breathing, no use of accessory muscles.   Cardiovascular: Regular rate and rhythm. No murmur, no gallop. Normal temperature, normal extremity pulses, no swelling, no varicosities.   Skin: No paleness, no jaundice, no cyanosis. No lesion, no ulcer, no rash.   Neurologic / Psychiatric: Oriented to time, oriented to place, oriented to person. No depression, no anxiety, no agitation.   Gastrointestinal: No mass, no tenderness, no rigidity, non obese abdomen.   Musculoskeletal: Normal gait and station of head and neck.      IMAGING:  ImagingResults(Last48hours) Ct Abdomen Pelvis Wo Contrast  Result Date: 08/16/2017 CLINICAL DATA:  Abdominal distention, nausea, vomiting EXAM: CT ABDOMEN AND PELVIS WITHOUT CONTRAST TECHNIQUE: Multidetector CT imaging of the abdomen and pelvis was performed following the standard protocol without IV contrast. COMPARISON:  Ultrasound 12/04/2016 FINDINGS: Lower chest: Bibasilar atelectasis.  Heart is normal size. Hepatobiliary: 2.2 cm low-density lesion in the right hepatic lobe peripherally likely corresponds to a cyst seen on prior ultrasound. Gallbladder grossly unremarkable. No biliary ductal dilatation. Pancreas: No focal abnormality or ductal dilatation. Spleen: No focal abnormality.  Normal size. Adrenals/Urinary Tract: Extensive bilateral perinephric stranding, right greater than left. Mild right hydronephrosis. 12 mm stone in the region of the right UPJ. Calcifications noted inferiorly could reflect small ureteral stones. The ureter is difficult to visualize and follow due to the extensive perinephric  stranding which extends inferiorly around the ureter. No stones or hydronephrosis on the left. Adrenal glands and urinary bladder unremarkable. Small amount of gas in the urinary bladder presumably from catheterization. Stomach/Bowel: Stomach, large and small bowel grossly unremarkable. Vascular/Lymphatic: Aortic atherosclerosis. No enlarged abdominal or pelvic lymph nodes. Reproductive: Prior hysterectomy.  No adnexal masses. Other: No free fluid or free air. Musculoskeletal: No acute bony abnormality. IMPRESSION: Extensive bilateral perinephric stranding, right greater than left. There is mild right hydronephrosis with 12 mm right UPJ stone. Other calcifications noted inferiorly could be ureteral stones on the right but difficult to follow the right ureter with the extensive perinephric and peri ureteral stranding. Aortic atherosclerosis. Bibasilar atelectasis. Electronically Signed   By: Rolm Baptise [name]   On: 08/16/2017 16:47     ------  Assessment:  77 y.o. female with hx of MM, recently on immunosuppressive therapy, who presents with fever, tachycardia and CT scan showing 2-3 obstructing ureteral stone and a large obstructing renal stone who underwent right ureteral stenting on an emergent basis on 08/16/17.  She was maintained on intravenous antibiotics and discharged on antibiotic therapy based on culture sensitivities.  When she returned for follow-up on 09/03/17 per urine was recultured and was found to be positive for Klebsiella that was nearly pansensitive with resistance only to ampicillin.  She was placed on Cipro and remains on that at this time.  She presents today for ureteroscopic management of her right renal calculi.  The procedure has been discussed with  her in detail including the potential risks and complications, the alternatives, the probability of success, the outpatient nature of the procedure as well as the anticipated postoperative course.  She understands and has elected  to proceed.  Plan: Cystoscopy with removal of her current right ureteral stent, right ureteroscopy and laser lithotripsy and likely stent replacement.

## 2017-09-07 NOTE — Anesthesia Preprocedure Evaluation (Addendum)
Anesthesia Evaluation  Patient identified by MRN, date of birth, ID band Patient awake    Reviewed: Allergy & Precautions, NPO status , Patient's Chart, lab work & pertinent test results  History of Anesthesia Complications Negative for: history of anesthetic complications  Airway Mallampati: II  TM Distance: >3 FB Neck ROM: Full    Dental  (+) Upper Dentures, Dental Advisory Given   Pulmonary neg pulmonary ROS,    breath sounds clear to auscultation       Cardiovascular negative cardio ROS   Rhythm:Regular Rate:Normal     Neuro/Psych Peripheral neuropathy (right hand/fingers, and toes)  Neuromuscular disease negative psych ROS   GI/Hepatic Neg liver ROS, GERD  Medicated and Controlled,  Endo/Other  negative endocrine ROS  Renal/GU ARFRenal disease     Musculoskeletal  (+) Arthritis , Gout   Abdominal   Peds  Hematology  (+) anemia ,   Anesthesia Other Findings Hypokalemia on treatment, hx multiple myeloma  Reproductive/Obstetrics                            Anesthesia Physical  Anesthesia Plan  ASA: II  Anesthesia Plan: General   Post-op Pain Management:    Induction: Intravenous  PONV Risk Score and Plan: 3 and Ondansetron, Dexamethasone and Treatment may vary due to age or medical condition  Airway Management Planned: LMA  Additional Equipment: None  Intra-op Plan:   Post-operative Plan: Extubation in OR  Informed Consent: I have reviewed the patients History and Physical, chart, labs and discussed the procedure including the risks, benefits and alternatives for the proposed anesthesia with the patient or authorized representative who has indicated his/her understanding and acceptance.   Dental advisory given  Plan Discussed with: CRNA and Anesthesiologist  Anesthesia Plan Comments:        Anesthesia Quick Evaluation  

## 2017-09-08 ENCOUNTER — Ambulatory Visit (HOSPITAL_COMMUNITY): Payer: Medicare HMO

## 2017-09-08 ENCOUNTER — Encounter (HOSPITAL_BASED_OUTPATIENT_CLINIC_OR_DEPARTMENT_OTHER): Payer: Self-pay

## 2017-09-08 ENCOUNTER — Ambulatory Visit (HOSPITAL_BASED_OUTPATIENT_CLINIC_OR_DEPARTMENT_OTHER): Payer: Medicare HMO | Admitting: Anesthesiology

## 2017-09-08 ENCOUNTER — Ambulatory Visit (HOSPITAL_BASED_OUTPATIENT_CLINIC_OR_DEPARTMENT_OTHER)
Admission: RE | Admit: 2017-09-08 | Discharge: 2017-09-08 | Disposition: A | Discharge status: Home / Self Care | Payer: Medicare HMO | Source: Ambulatory Visit | Attending: Urology | Admitting: Urology

## 2017-09-08 ENCOUNTER — Encounter (HOSPITAL_BASED_OUTPATIENT_CLINIC_OR_DEPARTMENT_OTHER)
Admission: RE | Disposition: A | Discharge status: Home / Self Care | Payer: Self-pay | Source: Ambulatory Visit | Attending: Urology

## 2017-09-08 DIAGNOSIS — N202 Calculus of kidney with calculus of ureter: Secondary | ICD-10-CM | POA: Diagnosis not present

## 2017-09-08 DIAGNOSIS — I10 Essential (primary) hypertension: Secondary | ICD-10-CM | POA: Insufficient documentation

## 2017-09-08 DIAGNOSIS — E876 Hypokalemia: Secondary | ICD-10-CM | POA: Diagnosis not present

## 2017-09-08 DIAGNOSIS — M1990 Unspecified osteoarthritis, unspecified site: Secondary | ICD-10-CM | POA: Insufficient documentation

## 2017-09-08 DIAGNOSIS — Z8249 Family history of ischemic heart disease and other diseases of the circulatory system: Secondary | ICD-10-CM | POA: Diagnosis not present

## 2017-09-08 DIAGNOSIS — Z882 Allergy status to sulfonamides status: Secondary | ICD-10-CM | POA: Insufficient documentation

## 2017-09-08 DIAGNOSIS — N132 Hydronephrosis with renal and ureteral calculous obstruction: Secondary | ICD-10-CM | POA: Diagnosis present

## 2017-09-08 DIAGNOSIS — Z803 Family history of malignant neoplasm of breast: Secondary | ICD-10-CM | POA: Insufficient documentation

## 2017-09-08 DIAGNOSIS — N200 Calculus of kidney: Secondary | ICD-10-CM

## 2017-09-08 DIAGNOSIS — Z87442 Personal history of urinary calculi: Secondary | ICD-10-CM | POA: Insufficient documentation

## 2017-09-08 DIAGNOSIS — Z7901 Long term (current) use of anticoagulants: Secondary | ICD-10-CM | POA: Insufficient documentation

## 2017-09-08 DIAGNOSIS — H409 Unspecified glaucoma: Secondary | ICD-10-CM | POA: Insufficient documentation

## 2017-09-08 DIAGNOSIS — K219 Gastro-esophageal reflux disease without esophagitis: Secondary | ICD-10-CM | POA: Insufficient documentation

## 2017-09-08 DIAGNOSIS — C9000 Multiple myeloma not having achieved remission: Secondary | ICD-10-CM | POA: Diagnosis not present

## 2017-09-08 DIAGNOSIS — N179 Acute kidney failure, unspecified: Secondary | ICD-10-CM | POA: Diagnosis not present

## 2017-09-08 DIAGNOSIS — Z9481 Bone marrow transplant status: Secondary | ICD-10-CM | POA: Insufficient documentation

## 2017-09-08 DIAGNOSIS — Z880 Allergy status to penicillin: Secondary | ICD-10-CM | POA: Diagnosis not present

## 2017-09-08 DIAGNOSIS — Z888 Allergy status to other drugs, medicaments and biological substances status: Secondary | ICD-10-CM | POA: Insufficient documentation

## 2017-09-08 DIAGNOSIS — M810 Age-related osteoporosis without current pathological fracture: Secondary | ICD-10-CM | POA: Diagnosis not present

## 2017-09-08 DIAGNOSIS — N201 Calculus of ureter: Secondary | ICD-10-CM

## 2017-09-08 DIAGNOSIS — Z90710 Acquired absence of both cervix and uterus: Secondary | ICD-10-CM | POA: Insufficient documentation

## 2017-09-08 DIAGNOSIS — Z800 Family history of malignant neoplasm of digestive organs: Secondary | ICD-10-CM | POA: Diagnosis not present

## 2017-09-08 DIAGNOSIS — E785 Hyperlipidemia, unspecified: Secondary | ICD-10-CM | POA: Insufficient documentation

## 2017-09-08 DIAGNOSIS — M109 Gout, unspecified: Secondary | ICD-10-CM | POA: Insufficient documentation

## 2017-09-08 DIAGNOSIS — Z79899 Other long term (current) drug therapy: Secondary | ICD-10-CM | POA: Insufficient documentation

## 2017-09-08 DIAGNOSIS — Z886 Allergy status to analgesic agent status: Secondary | ICD-10-CM | POA: Diagnosis not present

## 2017-09-08 DIAGNOSIS — G629 Polyneuropathy, unspecified: Secondary | ICD-10-CM | POA: Insufficient documentation

## 2017-09-08 DIAGNOSIS — Z823 Family history of stroke: Secondary | ICD-10-CM | POA: Diagnosis not present

## 2017-09-08 DIAGNOSIS — Z832 Family history of diseases of the blood and blood-forming organs and certain disorders involving the immune mechanism: Secondary | ICD-10-CM | POA: Insufficient documentation

## 2017-09-08 DIAGNOSIS — N390 Urinary tract infection, site not specified: Secondary | ICD-10-CM | POA: Diagnosis not present

## 2017-09-08 HISTORY — DX: Drug-induced polyneuropathy: T45.1X5A

## 2017-09-08 HISTORY — DX: Multiple myeloma in remission: C90.01

## 2017-09-08 HISTORY — PX: CYSTOSCOPY/RETROGRADE/URETEROSCOPY: SHX5316

## 2017-09-08 HISTORY — DX: Diarrhea, unspecified: R19.7

## 2017-09-08 HISTORY — DX: Personal history of antineoplastic chemotherapy: Z92.21

## 2017-09-08 HISTORY — DX: Personal history of urinary (tract) infections: Z87.440

## 2017-09-08 HISTORY — DX: Stem cells transplant status: Z94.84

## 2017-09-08 HISTORY — DX: Calculus of kidney: N20.0

## 2017-09-08 HISTORY — DX: Presence of spectacles and contact lenses: Z97.3

## 2017-09-08 HISTORY — DX: Drug-induced polyneuropathy: G62.0

## 2017-09-08 HISTORY — DX: Personal history of other infectious and parasitic diseases: Z86.19

## 2017-09-08 HISTORY — DX: Presence of dental prosthetic device (complete) (partial): Z97.2

## 2017-09-08 HISTORY — DX: Calculus of ureter: N20.1

## 2017-09-08 SURGERY — CYSTOSCOPY/RETROGRADE/URETEROSCOPY
Anesthesia: General | Laterality: Right

## 2017-09-08 MED ORDER — KETOROLAC TROMETHAMINE 30 MG/ML IJ SOLN
INTRAMUSCULAR | Status: DC | PRN
  Administered 2017-09-08: 30 mg via INTRAVENOUS

## 2017-09-08 MED ORDER — FENTANYL CITRATE (PF) 100 MCG/2ML IJ SOLN
INTRAMUSCULAR | Status: AC
  Filled 2017-09-08: qty 2

## 2017-09-08 MED ORDER — DEXAMETHASONE SODIUM PHOSPHATE 10 MG/ML IJ SOLN
INTRAMUSCULAR | Status: AC
  Filled 2017-09-08: qty 1

## 2017-09-08 MED ORDER — DEXAMETHASONE SODIUM PHOSPHATE 10 MG/ML IJ SOLN
INTRAMUSCULAR | Status: DC | PRN
  Administered 2017-09-08: 5 mg via INTRAVENOUS

## 2017-09-08 MED ORDER — ONDANSETRON HCL 4 MG/2ML IJ SOLN
INTRAMUSCULAR | Status: AC
  Filled 2017-09-08: qty 2

## 2017-09-08 MED ORDER — OXYCODONE HCL 5 MG PO TABS
5.0000 mg | ORAL_TABLET | Freq: Once | ORAL | Status: DC | PRN
  Filled 2017-09-08: qty 1

## 2017-09-08 MED ORDER — LIDOCAINE 2% (20 MG/ML) 5 ML SYRINGE
INTRAMUSCULAR | Status: AC
  Filled 2017-09-08: qty 5

## 2017-09-08 MED ORDER — CIPROFLOXACIN IN D5W 400 MG/200ML IV SOLN
INTRAVENOUS | Status: AC
  Filled 2017-09-08: qty 200

## 2017-09-08 MED ORDER — HYDROCODONE-ACETAMINOPHEN 10-325 MG PO TABS: 1.0000 | ORAL_TABLET | ORAL | 0 refills | Status: DC | PRN

## 2017-09-08 MED ORDER — FENTANYL CITRATE (PF) 100 MCG/2ML IJ SOLN
INTRAMUSCULAR | Status: DC | PRN
  Administered 2017-09-08: 50 ug via INTRAVENOUS
  Administered 2017-09-08: 25 ug via INTRAVENOUS

## 2017-09-08 MED ORDER — CIPROFLOXACIN IN D5W 400 MG/200ML IV SOLN
400.0000 mg | Freq: Once | INTRAVENOUS | Status: AC
  Administered 2017-09-08: 400 mg via INTRAVENOUS
  Filled 2017-09-08: qty 200

## 2017-09-08 MED ORDER — FENTANYL CITRATE (PF) 100 MCG/2ML IJ SOLN
25.0000 ug | INTRAMUSCULAR | Status: DC | PRN
  Filled 2017-09-08: qty 1

## 2017-09-08 MED ORDER — LACTATED RINGERS IV SOLN
INTRAVENOUS | Status: DC
  Administered 2017-09-08 (×2): via INTRAVENOUS
  Filled 2017-09-08: qty 1000

## 2017-09-08 MED ORDER — KETOROLAC TROMETHAMINE 30 MG/ML IJ SOLN
INTRAMUSCULAR | Status: AC
Start: 2017-09-08 — End: ?
  Filled 2017-09-08: qty 1

## 2017-09-08 MED ORDER — ONDANSETRON HCL 4 MG/2ML IJ SOLN
INTRAMUSCULAR | Status: DC | PRN
  Administered 2017-09-08: 4 mg via INTRAVENOUS

## 2017-09-08 MED ORDER — PROPOFOL 10 MG/ML IV BOLUS
INTRAVENOUS | Status: AC
  Filled 2017-09-08: qty 40

## 2017-09-08 MED ORDER — IOHEXOL 300 MG/ML  SOLN
INTRAMUSCULAR | Status: DC | PRN
  Administered 2017-09-08: 10 mL via URETHRAL

## 2017-09-08 MED ORDER — PHENAZOPYRIDINE HCL 200 MG PO TABS: 200.0000 mg | ORAL_TABLET | Freq: Three times a day (TID) | ORAL | 0 refills | Status: DC | PRN

## 2017-09-08 MED ORDER — OXYCODONE HCL 5 MG/5ML PO SOLN
5.0000 mg | Freq: Once | ORAL | Status: DC | PRN
  Filled 2017-09-08: qty 5

## 2017-09-08 MED ORDER — SODIUM CHLORIDE 0.9 % IR SOLN
Status: DC | PRN
  Administered 2017-09-08: 3000 mL via INTRAVESICAL

## 2017-09-08 MED ORDER — PROPOFOL 10 MG/ML IV BOLUS
INTRAVENOUS | Status: DC | PRN
  Administered 2017-09-08: 150 mg via INTRAVENOUS

## 2017-09-08 MED ORDER — LIDOCAINE 2% (20 MG/ML) 5 ML SYRINGE
INTRAMUSCULAR | Status: DC | PRN
  Administered 2017-09-08: 80 mg via INTRAVENOUS

## 2017-09-08 MED ORDER — ONDANSETRON HCL 4 MG/2ML IJ SOLN
4.0000 mg | Freq: Once | INTRAMUSCULAR | Status: DC | PRN
  Filled 2017-09-08: qty 2

## 2017-09-08 SURGICAL SUPPLY — 28 items
BAG DRAIN URO-CYSTO SKYTR STRL (DRAIN) ×2 IMPLANT
BAG DRN UROCATH (DRAIN) ×1
BASKET STONE 1.7 NGAGE (UROLOGICAL SUPPLIES) ×1 IMPLANT
BASKET ZERO TIP NITINOL 2.4FR (BASKET) ×1 IMPLANT
BSKT STON RTRVL ZERO TP 2.4FR (BASKET) ×1
CATH INTERMIT  6FR 70CM (CATHETERS) ×1 IMPLANT
CATH URET 5FR 28IN CONE TIP (BALLOONS)
CATH URET 5FR 70CM CONE TIP (BALLOONS) IMPLANT
CLOTH BEACON ORANGE TIMEOUT ST (SAFETY) ×2 IMPLANT
EXTRACTOR STONE 1.7FRX115CM (UROLOGICAL SUPPLIES) IMPLANT
FIBER LASER FLEXIVA 365 (UROLOGICAL SUPPLIES) IMPLANT
FIBER LASER TRAC TIP (UROLOGICAL SUPPLIES) ×2 IMPLANT
GLOVE BIO SURGEON STRL SZ8 (GLOVE) ×2 IMPLANT
GOWN STRL REUS W/TWL XL LVL3 (GOWN DISPOSABLE) ×2 IMPLANT
GUIDEWIRE 0.038 PTFE COATED (WIRE) IMPLANT
GUIDEWIRE ANG ZIPWIRE 038X150 (WIRE) IMPLANT
GUIDEWIRE STR DUAL SENSOR (WIRE) ×3 IMPLANT
INFUSOR MANOMETER BAG 3000ML (MISCELLANEOUS) ×2 IMPLANT
IV NS IRRIG 3000ML ARTHROMATIC (IV SOLUTION) ×4 IMPLANT
KIT TURNOVER CYSTO (KITS) ×2 IMPLANT
MANIFOLD NEPTUNE II (INSTRUMENTS) IMPLANT
NS IRRIG 500ML POUR BTL (IV SOLUTION) ×2 IMPLANT
PACK CYSTO (CUSTOM PROCEDURE TRAY) ×2 IMPLANT
SHEATH URETERAL 12FRX35CM (MISCELLANEOUS) ×1 IMPLANT
STENT POLARIS LOOP 6FR X 24 CM (STENTS) ×1 IMPLANT
TUBE CONNECTING 12X1/4 (SUCTIONS) ×1 IMPLANT
TUBING UROLOGY SET (TUBING) IMPLANT
WATER STERILE IRR 3000ML UROMA (IV SOLUTION) IMPLANT

## 2017-09-08 NOTE — Discharge Instructions (Signed)
Post stone removal/stent placement surgery instructions   Definitions:  Ureter: The duct that transports urine from the kidney to the bladder. Stent: A plastic hollow tube that is placed into the ureter, from the kidney to the bladder to prevent the ureter from swelling shut.  General instructions:  Despite the fact that no skin incisions were used, the area around the ureter and bladder is raw and irritated. The stent is a foreign body which will further irritate the bladder wall. This irritation is manifested by increased frequency of urination, both day and night, and by an increase in the urge to urinate. In some, the urge to urinate is present almost always. Sometimes the urge is strong enough that you may not be able to stop your self from urinating. The only real cure is to remove the stent and then give time for the bladder wall to heal which can't be done until the danger of the ureter swelling shut has passed. (This varies from 2-21 days).  You may see some blood in your urine while the stent is in place and a few days afterward. Do not be alarmed, even if the urine is clear for a while. Get off your feet and drink lots of fluids until clearing occurs. If you start to pass clots or don't improve, call us.  If you have a string coming from your urethra:  The stent string is attached to your ureteral stent.  Do not pull on this.  Diet:  You may return to your normal diet immediately. Because of the raw surface of your bladder, alcohol, spicy foods, foods high in acid and drinks with caffeine may cause irritation or frequency and should be used in moderation. To keep your urine flowing freely and avoid constipation, drink plenty of fluids during the day (8-10 glasses). Tip: Avoid cranberry juice because it is very acidic.  Activity:  Your physical activity doesn't need to be restricted. However, if you are very active, you may see some blood in the urine. We suggest that you reduce your  activity under the circumstances until the bleeding has stopped.  Bowels:  It is important to keep your bowels regular during the postoperative period. Straining with bowel movements can cause bleeding. A bowel movement every other day is reasonable. Use a mild laxative if needed, such as milk of magnesia 2-3 tablespoons, or 2 Dulcolax tablets. Call if you continue to have problems. If you had been taking narcotics for pain, before, during or after your surgery, you may be constipated. Take a laxative if necessary.     Medication:  You should resume your pre-surgery medications unless told not to. DO NOT RESUME YOUR ASPIRIN, or any other medicines like ibuprofen, motrin, excedrin, advil, aleve, vitamin E, fish oil as these can all cause bleeding x 7 days. In addition you may be given an antibiotic to prevent or treat infection. Antibiotics are not always necessary. All medication should be taken as prescribed until the bottles are finished unless you are having an unusual reaction to one of the drugs.  Problems you should report to [place]:  a. Fever greater than 101F. b. Heavy bleeding, or clots (see notes above about blood in urine). c. Inability to urinate. d. Drug reactions (hives, rash, nausea, vomiting, diarrhea). e. Severe burning or pain with urination that is not improving.  Followup:  You will need a followup appointment to monitor your progress in most cases. Please call the office for this appointment when you get home  if your appointment has not already been scheduled. Usually the first appointment will be about 5-14 days after your surgery and if you have a stent in place it will likely be removed at that time.   Ibuprofen may be take after 2:30 pm if needed  Post Anesthesia Home Care Instructions  Activity: Get plenty of rest for the remainder of the day. A responsible individual must stay with you for 24 hours following the procedure.  For the next 24 hours, DO  NOT: -Drive a car -Paediatric nurse -Drink alcoholic beverages -Take any medication unless instructed by your physician -Make any legal decisions or sign important papers.  Meals: Start with liquid foods such as gelatin or soup. Progress to regular foods as tolerated. Avoid greasy, spicy, heavy foods. If nausea and/or vomiting occur, drink only clear liquids until the nausea and/or vomiting subsides. Call your physician if vomiting continues.  Special Instructions/Symptoms: Your throat may feel dry or sore from the anesthesia or the breathing tube placed in your throat during surgery. If this causes discomfort, gargle with warm salt water. The discomfort should disappear within 24 hours.  If you had a scopolamine patch placed behind your ear for the management of post- operative nausea and/or vomiting:  1. The medication in the patch is effective for 72 hours, after which it should be removed.  Wrap patch in a tissue and discard in the trash. Wash hands thoroughly with soap and water. 2. You may remove the patch earlier than 72 hours if you experience unpleasant side effects which may include dry mouth, dizziness or visual disturbances. 3. Avoid touching the patch. Wash your hands with soap and water after contact with the patch.

## 2017-09-08 NOTE — Anesthesia Procedure Notes (Signed)
Procedure Name: LMA Insertion Date/Time: 09/08/2017 7:33 AM Performed by: Wanita Chamberlain, CRNA Pre-anesthesia Checklist: Patient identified, Timeout performed, Emergency Drugs available, Suction available and Patient being monitored Patient Re-evaluated:Patient Re-evaluated prior to induction Oxygen Delivery Method: Circle system utilized Preoxygenation: Pre-oxygenation with 100% oxygen Induction Type: IV induction Ventilation: Mask ventilation without difficulty LMA: LMA inserted LMA Size: 4.0 Number of attempts: 1 Placement Confirmation: breath sounds checked- equal and bilateral,  CO2 detector,  positive ETCO2 and ETT inserted through vocal cords under direct vision Tube secured with: Tape Dental Injury: Teeth and Oropharynx as per pre-operative assessment

## 2017-09-08 NOTE — Op Note (Signed)
PATIENT:  Carmen Fisher  PRE-OPERATIVE DIAGNOSIS: 1.  Right renal calculus. 2.  History of right ureteral calculi  POST-OPERATIVE DIAGNOSIS: Right renal calculus  PROCEDURE:  1. Cystoscopy with removal of right ureteral stent. 2. Right retrograde pyelogram with interpretation. 3. Right ureteroscopy, laser lithotripsy and stone extraction. 4. Right double-J stent placement. 5. Fluoroscopy time 1 hour  SURGEON: Claybon Jabs, MD  INDICATION: Mrs. Sun is a 77 year old female who had obstructing right ureteral calculi with a large right renal pelvic stone.  She was septic and underwent urgent stent placement.  She presents today for ureteroscopic management of her stones.  A preop culture was positive and she has been maintained on oral antibiotics in preparation for surgery.  Her preop KUB revealed a large right renal pelvic stone but no other renal calculi and no stones visible along the course of the ureter with the stent in place.  ANESTHESIA:  General  EBL:  Minimal  DRAINS: 6 French, 24 cm Polaris stent (with string)  SPECIMEN: Fragment of stone sent for compositional analysis.  DESCRIPTION OF PROCEDURE: The patient was taken to the major OR and placed on the table. General anesthesia was administered and then the patient was moved to the dorsal lithotomy position. The genitalia was sterilely prepped and draped. An official timeout was performed.  Initially the 25 French cystoscope with 30 lens was passed under direct vision into the bladder. The bladder was then fully inspected. It was noted be free of any tumors, stones or inflammatory lesions. Ureteral orifices were of normal configuration and position.  The stent in her right ureter was identified and grasped with alligator forceps and withdrawn through the urethral meatus.  I then passed a 0.038 inch sensor guidewire through the stent and into the area the renal pelvis under fluoroscopy and left this in place.  A 6 French  open-ended ureteral catheter was then passed through the cystoscope into the ureteral orifice in order to perform a right retrograde pyelogram. A retrograde pyelogram was performed by injecting full-strength contrast up the right ureter under direct fluoroscopic control. It revealed a filling defect in the lower pole of the kidney consistent with the stone seen on the preoperative KUB.  I did not see any filling defects along the course of the ureter. I passed a second guidewire through the open-ended ureteral catheter and into the area the renal pelvis.  1 of the guidewires was attached to the drape to serve as a safety guidewire.  I then proceeded with ureteroscopy.  The ureteroscope was passed over the working guidewire up the ureter under direct vision.  I was able to visualize the entire length of the ureter and no ureteral stones were identified.  I therefore left the working guidewire in place and removed the ureteroscope.  Over the working guidewire I passed a ureteral access sheath and removed the inner portion of the sheath as well as the working guidewire.  The 6 [place] dual lumen flexible digital ureteroscope was then passed through the access sheath and into the renal pelvis.  A systematic inspection of each calyx was then undertaken starting at the lower pole and visualizing all calyces in the lower pole I did identify the single large stone seen on KUB with no other stones seen in the lower pole.  The middle and upper poles were then inspected and no stones were identified.  The stone was identified and I felt it was too large to extract and therefore elected to proceed  with laser lithotripsy.  I passed a 0 tip nitinol basket through the ureteroscope and engaged the stone in the lower pole and moved it to the upper pole in preparation for fragmentation with the laser.  The 200  holmium laser fiber was used to fragment the stone. I then used the nitinol basket to extract 1 of the stone fragments  to be sent for compositional analysis.  I then used the laser to completely fragment the stone into pieces less than 1 mm in size essentially "dusting" the stone.  Reinspection of all the calyces revealed no significant stone fragments remaining.  I then removed the access sheath and ureteroscope leaving the safety guidewire in place.  I then backloaded the cystoscope over the guidewire and passed the stent over the guidewire into the area of the renal pelvis. As the guidewire was removed good curl was noted in the renal pelvis. The bladder was drained and the cystoscope was then removed. The patient tolerated the procedure well no intraoperative complications.  The string, affixed to the distal aspect of the stent, was tucked in the vagina.  PLAN OF CARE: Discharge to home after PACU  PATIENT DISPOSITION:  PACU - hemodynamically stable.

## 2017-09-08 NOTE — Anesthesia Postprocedure Evaluation (Signed)
Anesthesia Post Note  Patient: Carmen Fisher  Procedure(s) Performed: CYSTOSCOPY/RETROGRADE/URETEROSCOPY/STONE EXTRACTION/ STENT EXCHANGE, STONE BASKET RETRIVAL, LASER (Right )     Patient location during evaluation: PACU Anesthesia Type: General Level of consciousness: awake and alert Pain management: pain level controlled Vital Signs Assessment: post-procedure vital signs reviewed and stable Respiratory status: spontaneous breathing, nonlabored ventilation and respiratory function stable Cardiovascular status: blood pressure returned to baseline and stable Postop Assessment: no apparent nausea or vomiting Anesthetic complications: no    Last Vitals:  Vitals:   09/08/17 0845 09/08/17 0900  BP: 138/78 129/75  Pulse: 66 66  Resp: 11 12  Temp:    SpO2: 100% 95%    Last Pain:  Vitals:   09/08/17 0900  TempSrc:   PainSc: 0-No pain                 Audry Pili

## 2017-09-08 NOTE — Transfer of Care (Signed)
Immediate Anesthesia Transfer of Care Note  Patient: Carmen Fisher  Procedure(s) Performed: CYSTOSCOPY/RETROGRADE/URETEROSCOPY/STONE EXTRACTION/ STENT EXCHANGE, STONE BASKET RETRIVAL, LASER (Right )  Patient Location: PACU  Anesthesia Type:General  Level of Consciousness: awake, alert , oriented and patient cooperative  Airway & Oxygen Therapy: Patient Spontanous Breathing and Patient connected to nasal cannula oxygen  Post-op Assessment: Report given to RN and Post -op Vital signs reviewed and stable  Post vital signs: Reviewed and stable  Last Vitals:  Vitals Value Taken Time  BP 142/93 09/08/2017  8:39 AM  Temp    Pulse 66 09/08/2017  8:42 AM  Resp 12 09/08/2017  8:42 AM  SpO2 100 % 09/08/2017  8:42 AM  Vitals shown include unvalidated device data.  Last Pain:  Vitals:   09/08/17 0548  TempSrc: Oral  PainSc:       Patients Stated Pain Goal: 5 (09/81/19 1478)  Complications: No apparent anesthesia complications

## 2017-09-09 ENCOUNTER — Encounter (HOSPITAL_BASED_OUTPATIENT_CLINIC_OR_DEPARTMENT_OTHER): Payer: Self-pay | Admitting: Urology

## 2017-09-11 ENCOUNTER — Inpatient Hospital Stay: Payer: Medicare HMO

## 2017-09-11 ENCOUNTER — Telehealth: Payer: Self-pay | Admitting: Internal Medicine

## 2017-09-11 ENCOUNTER — Encounter: Payer: Self-pay | Admitting: Internal Medicine

## 2017-09-11 ENCOUNTER — Inpatient Hospital Stay (HOSPITAL_BASED_OUTPATIENT_CLINIC_OR_DEPARTMENT_OTHER): Payer: Medicare HMO | Admitting: Internal Medicine

## 2017-09-11 VITALS — BP 159/83 | HR 84 | Temp 98.1°F | Resp 18 | Ht 65.0 in | Wt 161.0 lb

## 2017-09-11 DIAGNOSIS — R5383 Other fatigue: Secondary | ICD-10-CM | POA: Diagnosis not present

## 2017-09-11 DIAGNOSIS — C9000 Multiple myeloma not having achieved remission: Secondary | ICD-10-CM | POA: Diagnosis not present

## 2017-09-11 MED ORDER — ZOLEDRONIC ACID 4 MG/100ML IV SOLN
4.0000 mg | Freq: Once | INTRAVENOUS | Status: AC
  Administered 2017-09-11: 4 mg via INTRAVENOUS
  Filled 2017-09-11: qty 100

## 2017-09-11 NOTE — Progress Notes (Signed)
[place] [phone]   Fax:(336) [place], MD [address] [place] [address]  DIAGNOSIS: Multiple myeloma, Light chain diagnosed in August 2010  PRIOR THERAPY: 1) status post systemic chemotherapy with Velcade, Doxil and Decadron between 04/24/2009 through 07/27/2009 discontinued secondary to peripheral neuropathy. 2) status post treatment with Revlimid and Decadron between 09/01/2009 through December 2011. 3) status post peripheral blood autologous stem cell transplant on 04/26/2010 at [facility] under the care of Dr. Miki Kins. 4) maintenance chemotherapy with Revlimid 10 mg by mouth daily started June 2012. This will be changed to 5 mg by mouth daily starting next week secondary to persistent diarrhea.  CURRENT THERAPY: 1) Zometa 4 mg IV every 3 months.  INTERVAL HISTORY: Carmen Fisher 77 y.o. female returns to the clinic today for 3 months follow-up visit.  The patient is feeling fine today with no specific complaints except for fatigue.  She was recently treated for kidney stone.  She denied having any current chest pain, shortness breath, cough or hemoptysis.  She denied having any weight loss or night sweats.  She has no nausea, vomiting, diarrhea or constipation.  She has been in observation for several months and she has been doing well.  She had repeat myeloma panel performed recently and she is here for evaluation and discussion of her lab results.  MEDICAL HISTORY: Past Medical History:  Diagnosis Date  . Dyslipidemia   . GERD (gastroesophageal reflux disease)   . Gout    09-05-2017  per pt stable,  last flare-up long time ago  . H/O autologous stem cell transplant ([place]) 04-26-2010   @ [facility]  . History of acute pyelonephritis 08/16/2017  . History of chemotherapy 04-24-2009  to 07-27-2009   systemic  . History of sepsis    09/ 2014 secondary to UTI/  08-16-2017  SIRS,  severe sepsis due to acute pyelonephritis with kidney stone obstruction  . Hypokalemia   . Intermittent diarrhea   . Multiple myeloma in remission [facility]) oncologist-  dr Julien Nordmann ([facility] ) and dr Miki Kins at [facility]   dx 08/ 2010-- [place], completed chemo 07-27-2009 , s/p  high dose melphalan and autologous stem cell transplant 04-26-2010 @ [facility],  maintenance oral chemo (revlimid) started 06/ 2012--stopped 03/ 2019 due to neutropenia and diarrhea,  Zometa IV every 3 months  . Osteoarthritis   . Peripheral neuropathy due to chemotherapy ([place])   . Renal calculus, right   . Right ureteral calculus   . Wears dentures    upper  . Wears glasses     ALLERGIES:  is allergic to aspirin; ampicillin; sulfa antibiotics; and penicillins.  MEDICATIONS:  Current Outpatient Medications  Medication Sig Dispense Refill  . acetaminophen (TYLENOL) 500 MG tablet Take 500 mg by mouth every 6 (six) hours as needed.    Marland Kitchen allopurinol (ZYLOPRIM) 300 MG tablet TAKE 1 TABLET DAILY (Patient taking differently: TAKE 1 TABLET DAILY--- takes in am) 90 tablet 0  . cholecalciferol (VITAMIN D) 1000 UNITS tablet Take 1,000 Units by mouth daily.    . ciprofloxacin (CIPRO) 500 MG tablet Take 500 mg by mouth 2 (two) times daily.  0  . Cranberry 250 MG TABS Take 250 mg by mouth 2 (two) times daily.    . dorzolamide-timolol (COSOPT) 22.3-6.8 MG/ML ophthalmic solution Place 1 drop into both eyes 2 (two) times daily.     Marland Kitchen gabapentin (NEURONTIN) 600  MG tablet Take 1 tablet (600 mg total) by mouth 2 (two) times daily. (Patient taking differently: Take 600 mg by mouth 2 (two) times daily. ) 180 tablet 0  . HYDROcodone-acetaminophen (NORCO) 10-325 MG tablet Take 1-2 tablets by mouth every 4 (four) hours as needed for moderate pain. Maximum dose per 24 hours - 8 pills 14 tablet 0  . KLOR-CON M20 20 MEQ tablet TAKE 1 AND 1/2 TABLETS DAILY (Patient taking differently: TAKE 1 AND 1/2 TABLETS DAILY--- takes one tablet in  am and 1/2 tablet in evening) 135 tablet 0  . latanoprost (XALATAN) 0.005 % ophthalmic solution Place 1 drop into both eyes at bedtime.     Marland Kitchen loperamide (IMODIUM) 2 MG capsule TAKE 1 CAPSULE AS NEEDED   FOR DIARRHEA OR LOOSE STOOL(TAKE UP TO 6 CAPSULES     DAILY) 30 capsule 1  . LORazepam (ATIVAN) 1 MG tablet Take 1 mg by mouth every 4 (four) hours as needed (neuropathy). Reported on 09/14/2015    . Multiple Vitamin (MULTIVITAMIN WITH MINERALS) TABS tablet Take 1 tablet by mouth daily.    . pantoprazole (PROTONIX) 40 MG tablet Take 1 tablet (40 mg total) by mouth daily. (Patient taking differently: Take 40 mg by mouth every morning. ) 90 tablet 0  . phenazopyridine (PYRIDIUM) 200 MG tablet Take 1 tablet (200 mg total) by mouth 3 (three) times daily as needed for pain. 20 tablet 0   No current facility-administered medications for this visit.     SURGICAL HISTORY:  Past Surgical History:  Procedure Laterality Date  . APPENDECTOMY  1990s  . CARPAL TUNNEL RELEASE Left 10-14-2001   [facility]   AND A-1 PULLEY RELEASE LEFT RING FINGER  . CYSTOSCOPY W/ URETERAL STENT PLACEMENT Right 08/16/2017   Procedure: CYSTOSCOPY WITH RETROGRADE PYELOGRAM/URETERAL STENT PLACEMENT;  Surgeon: Ardis Hughs, MD;  Location: [place];  Service: Urology;  Laterality: Right;  . CYSTOSCOPY/RETROGRADE/URETEROSCOPY Right 09/08/2017   Procedure: CYSTOSCOPY/RETROGRADE/URETEROSCOPY/STONE EXTRACTION/ STENT EXCHANGE, STONE BASKET RETRIVAL, LASER;  Surgeon: Kathie Rhodes, MD;  Location: [facility];  Service: Urology;  Laterality: Right;  Marland Kitchen VAGINAL HYSTERECTOMY  1994   W/  BSO    REVIEW OF SYSTEMS:  A comprehensive review of systems was negative except for: Constitutional: positive for fatigue   PHYSICAL EXAMINATION: General appearance: alert, cooperative, fatigued and no distress Head: Normocephalic, without obvious abnormality, atraumatic Neck: no adenopathy, no JVD, supple, symmetrical, trachea midline and  thyroid not enlarged, symmetric, no tenderness/mass/nodules Lymph nodes: Cervical, supraclavicular, and axillary nodes normal. Resp: clear to auscultation bilaterally Back: symmetric, no curvature. ROM normal. No CVA tenderness. Cardio: regular rate and rhythm, S1, S2 normal, no murmur, click, rub or gallop GI: soft, non-tender; bowel sounds normal; no masses,  no organomegaly Extremities: extremities normal, atraumatic, no cyanosis or edema  ECOG PERFORMANCE STATUS: 1 - Symptomatic but completely ambulatory  Blood pressure (!) 159/83, pulse 84, temperature 98.1 F (36.7 C), temperature source Oral, resp. rate 18, height _0  (1.651 m), weight 161 lb (73 kg), SpO2 100 %.  LABORATORY DATA: Lab Results  Component Value Date   WBC 4.6 09/04/2017   HGB 10.3 (L) 09/04/2017   HCT 31.3 (L) 09/04/2017   MCV 98.5 09/04/2017   PLT 321 09/04/2017      Chemistry      Component Value Date/Time   NA 143 09/04/2017 1055   NA 141 03/06/2017 1024   K 3.6 09/04/2017 1055   K 3.9 03/06/2017 1024   CL 109 09/04/2017  1055   CL 106 07/28/2012 0924   CO2 28 09/04/2017 1055   CO2 25 03/06/2017 1024   BUN 13 09/04/2017 1055   BUN 14.3 03/06/2017 1024   CREATININE 0.95 09/04/2017 1055   CREATININE 1.0 03/06/2017 1024      Component Value Date/Time   CALCIUM 9.4 09/04/2017 1055   CALCIUM 9.8 03/06/2017 1024   ALKPHOS 84 09/04/2017 1055   ALKPHOS 74 03/06/2017 1024   AST 17 09/04/2017 1055   AST 16 03/06/2017 1024   ALT 10 09/04/2017 1055   ALT 15 03/06/2017 1024   BILITOT 0.3 09/04/2017 1055   BILITOT 0.57 03/06/2017 1024       RADIOGRAPHIC STUDIES: Ct Abdomen Pelvis Wo Contrast  Result Date: 08/16/2017 CLINICAL DATA:  Abdominal distention, nausea, vomiting EXAM: CT ABDOMEN AND PELVIS WITHOUT CONTRAST TECHNIQUE: Multidetector CT imaging of the abdomen and pelvis was performed following the standard protocol without IV contrast. COMPARISON:  Ultrasound 12/04/2016 FINDINGS: Lower chest:  Bibasilar atelectasis.  Heart is normal size. Hepatobiliary: 2.2 cm low-density lesion in the right hepatic lobe peripherally likely corresponds to a cyst seen on prior ultrasound. Gallbladder grossly unremarkable. No biliary ductal dilatation. Pancreas: No focal abnormality or ductal dilatation. Spleen: No focal abnormality.  Normal size. Adrenals/Urinary Tract: Extensive bilateral perinephric stranding, right greater than left. Mild right hydronephrosis. 12 mm stone in the region of the right UPJ. Calcifications noted inferiorly could reflect small ureteral stones. The ureter is difficult to visualize and follow due to the extensive perinephric stranding which extends inferiorly around the ureter. No stones or hydronephrosis on the left. Adrenal glands and urinary bladder unremarkable. Small amount of gas in the urinary bladder presumably from catheterization. Stomach/Bowel: Stomach, large and small bowel grossly unremarkable. Vascular/Lymphatic: Aortic atherosclerosis. No enlarged abdominal or pelvic lymph nodes. Reproductive: Prior hysterectomy.  No adnexal masses. Other: No free fluid or free air. Musculoskeletal: No acute bony abnormality. IMPRESSION: Extensive bilateral perinephric stranding, right greater than left. There is mild right hydronephrosis with 12 mm right UPJ stone. Other calcifications noted inferiorly could be ureteral stones on the right but difficult to follow the right ureter with the extensive perinephric and peri ureteral stranding. Aortic atherosclerosis. Bibasilar atelectasis. Electronically Signed   By: Rolm Baptise [name]   On: 08/16/2017 16:47   Kub Day Of Procedure  Result Date: 09/08/2017 CLINICAL DATA:  Pre op rt sided kidney stone, some rt abd discomfort this a.m, EXAM: ABDOMEN - 1 VIEW COMPARISON:  CT abdomen dated 08/16/2017. FINDINGS: Interval placement of a RIGHT-sided nephroureteral stent which appears appropriately positioned. 1.3 cm RIGHT renal stone. No LEFT-sided renal  calculi identified. Small calcifications are seen along the course of the RIGHT ureter, compatible with the intraureteral calculi demonstrated on earlier CT. Vascular calcifications noted in the LEFT lower pelvis. Bowel gas pattern is nonobstructive. No evidence of soft tissue mass or abnormal fluid collection. No evidence of free intraperitoneal air. No acute appearing osseous abnormality. IMPRESSION: 1. 1.3 cm RIGHT renal stone. Additional small intraureteral calculi, located at the level of the lumbosacral junction. RIGHT-sided nephroureteral stent in place. 2. Nonobstructive bowel gas pattern. Electronically Signed   By: Franki Cabot [name]   On: 09/08/2017 09:13   Ct Head Wo Contrast  Result Date: 08/16/2017 CLINICAL DATA:  77 year old female with a history of nausea and vomiting EXAM: CT HEAD WITHOUT CONTRAST TECHNIQUE: Contiguous axial images were obtained from the base of the skull through the vertex without intravenous contrast. COMPARISON:  None. FINDINGS: Brain: No acute  intracranial hemorrhage. No midline shift or mass effect. Gray-white differentiation maintained. Focal hypodensity lateral to the right caudate. Unremarkable appearance of the ventricular system. Vascular: Unremarkable. Skull: No acute fracture.  No aggressive bone lesion identified. Sinuses/Orbits: Unremarkable appearance of the orbits. Trace mucosal disease of the left frontal sinus. Other: None IMPRESSION: Negative for acute intracranial abnormality. Focal hypodensity lateral to the right caudate may represent dilated perivascular space or prior lacunar infarction. Electronically Signed   By: Corrie Mckusick D.O.   On: 08/16/2017 14:25   Dg Cystogram  Result Date: 08/18/2017 CLINICAL DATA:  Hydronephrosis with right ureteral calculus EXAM: RETROGRADE PYELOGRAM FROM OR FLUOROSCOPY TIME:  Fluoroscopy Time: 0 minutes 22 seconds Number of Acquired Spot Images: 1 COMPARISON:  CT abdomen and pelvis Aug 16, 2017 FINDINGS: Spot image of  the right kidney shows moderate hydronephrosis on the right with filling defect in the right renal pelvis, possibly a degree of hemorrhage. A double-J stent extends into this area. No calculus is evident on submitted image. IMPRESSION: Double-J stent extending in the right renal pelvis. Fullness of the calices on the right noted. Lucency in the right renal pelvis potentially could represent a degree of hemorrhage. Electronically Signed   By: Lowella Grip III [name]   On: 08/18/2017 07:41   Dg Chest Port 1 View  Result Date: 08/16/2017 CLINICAL DATA:  77 year old female with a history of shortness of breath and fever EXAM: PORTABLE CHEST 1 VIEW COMPARISON:  03/21/2010, 07/31/2009 FINDINGS: Cardiomediastinal silhouette accentuated by low lung volumes. Linear opacities at the lung bases. Right rotation somewhat limits evaluation. Accentuated mediastinal borders. No pneumothorax or pleural effusion. No large confluent airspace disease. No displaced fracture IMPRESSION: The accentuated heart borders are favored to represent low lung volumes and right rotation, however, mediastinal adenopathy is not excluded on this plain film. A formal PA and lateral chest x-ray may be useful. Likely atelectasis/consolidation at the lung bases. Electronically Signed   By: Corrie Mckusick D.O.   On: 08/16/2017 13:28    ASSESSMENT AND PLAN:  This is a very pleasant 77 years old African-American female with multiple myeloma and has been on maintenance Revlimid currently 5 mg by mouth daily and has been on this treatment for almost 6 years now. Her treatment was discontinued secondary to neutropenia and persistent diarrhea. The patient is current on observation and she is feeling fine. Repeat myeloma panel showed no concerning findings for progression. I discussed the lab results with the patient and recommended for her to continue on observation with repeat myeloma panel in 3 months. She was advised to call immediately if she  has any concerning symptoms in the interval. The patient voices understanding of current disease status and treatment options and is in agreement with the current care plan.  All questions were answered. The patient knows to call the clinic with any problems, questions or concerns. We can certainly see the patient much sooner if necessary.  Disclaimer: This note was dictated with voice recognition software. Similar sounding words can inadvertently be transcribed and may not be corrected upon review.

## 2017-09-11 NOTE — Telephone Encounter (Signed)
Scheduled appt per 6/13 los -gave patient aVS and calender per los.  

## 2017-09-11 NOTE — Patient Instructions (Signed)

## 2017-09-16 DIAGNOSIS — N201 Calculus of ureter: Secondary | ICD-10-CM | POA: Diagnosis not present

## 2017-09-16 DIAGNOSIS — R8279 Other abnormal findings on microbiological examination of urine: Secondary | ICD-10-CM | POA: Diagnosis not present

## 2017-10-08 DIAGNOSIS — M1990 Unspecified osteoarthritis, unspecified site: Secondary | ICD-10-CM | POA: Diagnosis not present

## 2017-10-08 DIAGNOSIS — B961 Klebsiella pneumoniae [K. pneumoniae] as the cause of diseases classified elsewhere: Secondary | ICD-10-CM | POA: Diagnosis not present

## 2017-10-08 DIAGNOSIS — M109 Gout, unspecified: Secondary | ICD-10-CM | POA: Diagnosis not present

## 2017-10-08 DIAGNOSIS — N136 Pyonephrosis: Secondary | ICD-10-CM | POA: Diagnosis not present

## 2017-10-10 ENCOUNTER — Other Ambulatory Visit: Payer: Self-pay | Admitting: *Deleted

## 2017-10-10 MED ORDER — POTASSIUM CHLORIDE CRYS ER 20 MEQ PO TBCR: EXTENDED_RELEASE_TABLET | ORAL | 0 refills | Status: DC

## 2017-10-13 ENCOUNTER — Other Ambulatory Visit: Payer: Self-pay | Admitting: Medical Oncology

## 2017-10-13 ENCOUNTER — Telehealth: Payer: Self-pay | Admitting: Medical Oncology

## 2017-10-13 DIAGNOSIS — C9000 Multiple myeloma not having achieved remission: Secondary | ICD-10-CM

## 2017-10-13 MED ORDER — GABAPENTIN 600 MG PO TABS: 600.0000 mg | ORAL_TABLET | Freq: Two times a day (BID) | ORAL | 0 refills | Status: DC

## 2017-10-13 MED ORDER — ALLOPURINOL 300 MG PO TABS
300.0000 mg | ORAL_TABLET | Freq: Every day | ORAL | 0 refills | Status: DC
Start: 2017-10-13 — End: 2017-12-31

## 2017-10-13 NOTE — Telephone Encounter (Signed)
Called in refills for gabapentin and allopurinol.

## 2017-11-13 DIAGNOSIS — H349 Unspecified retinal vascular occlusion: Secondary | ICD-10-CM | POA: Diagnosis not present

## 2017-11-13 DIAGNOSIS — H40011 Open angle with borderline findings, low risk, right eye: Secondary | ICD-10-CM | POA: Diagnosis not present

## 2017-11-19 ENCOUNTER — Other Ambulatory Visit: Payer: Self-pay | Admitting: Medical Oncology

## 2017-11-19 DIAGNOSIS — R197 Diarrhea, unspecified: Secondary | ICD-10-CM

## 2017-11-19 MED ORDER — LOPERAMIDE HCL 2 MG PO CAPS: ORAL_CAPSULE | ORAL | 1 refills | Status: DC

## 2017-12-04 ENCOUNTER — Inpatient Hospital Stay: Payer: Medicare HMO | Attending: Internal Medicine

## 2017-12-04 DIAGNOSIS — C9000 Multiple myeloma not having achieved remission: Secondary | ICD-10-CM | POA: Insufficient documentation

## 2017-12-04 LAB — CMP (CANCER CENTER ONLY)
ALT: 12 U/L (ref 0–44)
AST: 16 U/L (ref 15–41)
Albumin: 4 g/dL (ref 3.5–5.0)
Alkaline Phosphatase: 58 U/L (ref 38–126)
Anion gap: 8 (ref 5–15)
BUN: 12 mg/dL (ref 8–23)
CO2: 27 mmol/L (ref 22–32)
Calcium: 9.6 mg/dL (ref 8.9–10.3)
Chloride: 109 mmol/L (ref 98–111)
Creatinine: 1.02 mg/dL — ABNORMAL HIGH (ref 0.44–1.00)
GFR, Est AFR Am: >60 mL/min (ref >60)
GFR, Estimated: 52 mL/min — ABNORMAL LOW (ref >60)
Glucose, Bld: 86 mg/dL (ref 70–99)
Potassium: 3.8 mmol/L (ref 3.5–5.1)
Sodium: 144 mmol/L (ref 135–145)
Total Bilirubin: 0.5 mg/dL (ref 0.3–1.2)
Total Protein: 7.7 g/dL (ref 6.5–8.1)

## 2017-12-04 LAB — CBC WITH DIFFERENTIAL (CANCER CENTER ONLY)
Basophils Absolute: 0 10*3/uL (ref 0.0–0.1)
Basophils Relative: 1 %
Eosinophils Absolute: 0 10*3/uL (ref 0.0–0.5)
Eosinophils Relative: 1 %
HCT: 34.8 % (ref 34.8–46.6)
Hemoglobin: 11.3 g/dL — ABNORMAL LOW (ref 11.6–15.9)
Lymphocytes Relative: 31 %
Lymphs Abs: 1.7 10*3/uL (ref 0.9–3.3)
MCH: 32.3 pg (ref 25.1–34.0)
MCHC: 32.4 g/dL (ref 31.5–36.0)
MCV: 99.6 fL (ref 79.5–101.0)
Monocytes Absolute: 0.4 10*3/uL (ref 0.1–0.9)
Monocytes Relative: 7 %
Neutro Abs: 3.2 10*3/uL (ref 1.5–6.5)
Neutrophils Relative %: 60 %
Platelet Count: 190 10*3/uL (ref 145–400)
RBC: 3.49 MIL/uL — ABNORMAL LOW (ref 3.70–5.45)
RDW: 15.7 % — ABNORMAL HIGH (ref 11.2–14.5)
WBC Count: 5.3 10*3/uL (ref 3.9–10.3)

## 2017-12-04 LAB — LACTATE DEHYDROGENASE: LDH: 180 U/L (ref 98–192)

## 2017-12-05 LAB — IGG, IGA, IGM
IgA: 363 mg/dL (ref 64–422)
IgG (Immunoglobin G), Serum: 1545 mg/dL (ref 700–1600)
IgM (Immunoglobulin M), Srm: 31 mg/dL (ref 26–217)

## 2017-12-05 LAB — KAPPA/LAMBDA LIGHT CHAINS
Kappa free light chain: 37.8 mg/L — ABNORMAL HIGH (ref 3.3–19.4)
Kappa, lambda light chain ratio: 2.64 — ABNORMAL HIGH (ref 0.26–1.65)
Lambda free light chains: 14.3 mg/L (ref 5.7–26.3)

## 2017-12-05 LAB — BETA 2 MICROGLOBULIN, SERUM: Beta-2 Microglobulin: 1.5 mg/L (ref 0.6–2.4)

## 2017-12-11 ENCOUNTER — Telehealth: Payer: Self-pay

## 2017-12-11 ENCOUNTER — Inpatient Hospital Stay (HOSPITAL_BASED_OUTPATIENT_CLINIC_OR_DEPARTMENT_OTHER): Payer: Medicare HMO | Admitting: Internal Medicine

## 2017-12-11 VITALS — BP 135/64 | HR 77 | Temp 98.2°F | Resp 17 | Ht 65.0 in | Wt 163.9 lb

## 2017-12-11 DIAGNOSIS — C9000 Multiple myeloma not having achieved remission: Secondary | ICD-10-CM

## 2017-12-11 NOTE — Progress Notes (Signed)
[place] [phone]   Fax:(336) [place], MD [address] [place] [address]  DIAGNOSIS: Multiple myeloma, Light chain diagnosed in August 2010  PRIOR THERAPY: 1) status post systemic chemotherapy with Velcade, Doxil and Decadron between 04/24/2009 through 07/27/2009 discontinued secondary to peripheral neuropathy. 2) status post treatment with Revlimid and Decadron between 09/01/2009 through December 2011. 3) status post peripheral blood autologous stem cell transplant on 04/26/2010 at [facility] under the care of Dr. Miki Kins. 4) maintenance chemotherapy with Revlimid 10 mg by mouth daily started June 2012. This will be changed to 5 mg by mouth daily starting next week secondary to persistent diarrhea.  CURRENT THERAPY: 1) Zometa 4 mg IV every 3 months.  INTERVAL HISTORY: Carmen Fisher 77 y.o. female returns to the clinic today for follow-up visit.  The patient has no complaints today.  She denied having any chest pain, shortness of breath, cough or hemoptysis.  She denied having any recent weight loss or night sweats.  She has no nausea, vomiting, diarrhea or constipation.  She has been in observation for several months now.  She had repeat myeloma panel performed recently and she is here for evaluation and discussion of her lab results.   MEDICAL HISTORY: Past Medical History:  Diagnosis Date  . Dyslipidemia   . GERD (gastroesophageal reflux disease)   . Gout    09-05-2017  per pt stable,  last flare-up long time ago  . H/O autologous stem cell transplant ([place]) 04-26-2010   @ [facility]  . History of acute pyelonephritis 08/16/2017  . History of chemotherapy 04-24-2009  to 07-27-2009   systemic  . History of sepsis    09/ 2014 secondary to UTI/  08-16-2017  SIRS, severe sepsis due to acute pyelonephritis with kidney stone obstruction  . Hypokalemia   . Intermittent diarrhea   .  Multiple myeloma in remission [facility]) oncologist-  dr Julien Nordmann ([facility] ) and dr Miki Kins at [facility]   dx 08/ 2010-- [place], completed chemo 07-27-2009 , s/p  high dose melphalan and autologous stem cell transplant 04-26-2010 @ [facility],  maintenance oral chemo (revlimid) started 06/ 2012--stopped 03/ 2019 due to neutropenia and diarrhea,  Zometa IV every 3 months  . Osteoarthritis   . Peripheral neuropathy due to chemotherapy ([place])   . Renal calculus, right   . Right ureteral calculus   . Wears dentures    upper  . Wears glasses     ALLERGIES:  is allergic to aspirin; ampicillin; sulfa antibiotics; and penicillins.  MEDICATIONS:  Current Outpatient Medications  Medication Sig Dispense Refill  . acetaminophen (TYLENOL) 500 MG tablet Take 500 mg by mouth every 6 (six) hours as needed.    Marland Kitchen allopurinol (ZYLOPRIM) 300 MG tablet Take 1 tablet (300 mg total) by mouth daily. 90 tablet 0  . cholecalciferol (VITAMIN D) 1000 UNITS tablet Take 1,000 Units by mouth daily.    . dorzolamide-timolol (COSOPT) 22.3-6.8 MG/ML ophthalmic solution Place 1 drop into both eyes 2 (two) times daily.     . fluconazole (DIFLUCAN) 200 MG tablet Take 200 mg by mouth daily.  0  . gabapentin (NEURONTIN) 600 MG tablet Take 1 tablet (600 mg total) by mouth 2 (two) times daily. 180 tablet 0  . HYDROcodone-acetaminophen (NORCO) 10-325 MG tablet Take 1-2 tablets by mouth every 4 (four) hours as needed for moderate pain. Maximum dose per 24 hours - 8  pills 14 tablet 0  . latanoprost (XALATAN) 0.005 % ophthalmic solution Place 1 drop into both eyes at bedtime.     Marland Kitchen loperamide (IMODIUM) 2 MG capsule TAKE 1 CAPSULE AS NEEDED   FOR DIARRHEA OR LOOSE STOOL(TAKE UP TO 6 CAPSULES     DAILY) 30 capsule 1  . LORazepam (ATIVAN) 1 MG tablet Take 1 mg by mouth every 4 (four) hours as needed (neuropathy). Reported on 09/14/2015    . Multiple Vitamin (MULTIVITAMIN WITH MINERALS) TABS tablet Take 1 tablet by mouth daily.    .  pantoprazole (PROTONIX) 40 MG tablet Take 1 tablet (40 mg total) by mouth daily. (Patient taking differently: Take 40 mg by mouth every morning. ) 90 tablet 0  . phenazopyridine (PYRIDIUM) 200 MG tablet Take 1 tablet (200 mg total) by mouth 3 (three) times daily as needed for pain. 20 tablet 0  . potassium chloride SA (KLOR-CON M20) 20 MEQ tablet TAKE 1 AND 1/2 TABLETS DAILY--- takes one tablet in am and 1/2 tablet in evening 135 tablet 0   No current facility-administered medications for this visit.     SURGICAL HISTORY:  Past Surgical History:  Procedure Laterality Date  . APPENDECTOMY  1990s  . CARPAL TUNNEL RELEASE Left 10-14-2001   [facility]   AND A-1 PULLEY RELEASE LEFT RING FINGER  . CYSTOSCOPY W/ URETERAL STENT PLACEMENT Right 08/16/2017   Procedure: CYSTOSCOPY WITH RETROGRADE PYELOGRAM/URETERAL STENT PLACEMENT;  Surgeon: Ardis Hughs, MD;  Location: [place];  Service: Urology;  Laterality: Right;  . CYSTOSCOPY/RETROGRADE/URETEROSCOPY Right 09/08/2017   Procedure: CYSTOSCOPY/RETROGRADE/URETEROSCOPY/STONE EXTRACTION/ STENT EXCHANGE, STONE BASKET RETRIVAL, LASER;  Surgeon: Kathie Rhodes, MD;  Location: [facility];  Service: Urology;  Laterality: Right;  Marland Kitchen VAGINAL HYSTERECTOMY  1994   W/  BSO    REVIEW OF SYSTEMS:  A comprehensive review of systems was negative.   PHYSICAL EXAMINATION: General appearance: alert, cooperative and no distress Head: Normocephalic, without obvious abnormality, atraumatic Neck: no adenopathy, no JVD, supple, symmetrical, trachea midline and thyroid not enlarged, symmetric, no tenderness/mass/nodules Lymph nodes: Cervical, supraclavicular, and axillary nodes normal. Resp: clear to auscultation bilaterally Back: symmetric, no curvature. ROM normal. No CVA tenderness. Cardio: regular rate and rhythm, S1, S2 normal, no murmur, click, rub or gallop GI: soft, non-tender; bowel sounds normal; no masses,  no organomegaly Extremities: extremities  normal, atraumatic, no cyanosis or edema  ECOG PERFORMANCE STATUS: 1 - Symptomatic but completely ambulatory  Blood pressure 135/64, pulse 77, temperature 98.2 F (36.8 C), temperature source Oral, resp. rate 17, height 5' 5"  (1.651 m), weight 163 lb 14.4 oz (74.3 kg), SpO2 99 %.  LABORATORY DATA: Lab Results  Component Value Date   WBC 5.3 12/04/2017   HGB 11.3 (L) 12/04/2017   HCT 34.8 12/04/2017   MCV 99.6 12/04/2017   PLT 190 12/04/2017      Chemistry      Component Value Date/Time   NA 144 12/04/2017 1105   NA 141 03/06/2017 1024   K 3.8 12/04/2017 1105   K 3.9 03/06/2017 1024   CL 109 12/04/2017 1105   CL 106 07/28/2012 0924   CO2 27 12/04/2017 1105   CO2 25 03/06/2017 1024   BUN 12 12/04/2017 1105   BUN 14.3 03/06/2017 1024   CREATININE 1.02 (H) 12/04/2017 1105   CREATININE 1.0 03/06/2017 1024      Component Value Date/Time   CALCIUM 9.6 12/04/2017 1105   CALCIUM 9.8 03/06/2017 1024   ALKPHOS 58 12/04/2017 1105  ALKPHOS 74 03/06/2017 1024   AST 16 12/04/2017 1105   AST 16 03/06/2017 1024   ALT 12 12/04/2017 1105   ALT 15 03/06/2017 1024   BILITOT 0.5 12/04/2017 1105   BILITOT 0.57 03/06/2017 1024       RADIOGRAPHIC STUDIES: No results found.  ASSESSMENT AND PLAN:  This is a very pleasant 77 years old African-American female with multiple myeloma and has been on maintenance Revlimid currently 5 mg by mouth daily and has been on this treatment for almost 6 years now. Her treatment was discontinued secondary to neutropenia and persistent diarrhea. The patient has been on observation since that time and she is feeling fine. Repeat myeloma panel performed recently showed no concerning findings for disease progression. I discussed the lab results with the patient I recommended for her to continue on observation with repeat myeloma panel in 3 months. For the bone disease, she will continue her current treatment with Zometa every 3 months. She was advised to  call immediately if she has any concerning symptoms in the interval. The patient voices understanding of current disease status and treatment options and is in agreement with the current care plan.  All questions were answered. The patient knows to call the clinic with any problems, questions or concerns. We can certainly see the patient much sooner if necessary.  Disclaimer: This note was dictated with voice recognition software. Similar sounding words can inadvertently be transcribed and may not be corrected upon review.

## 2017-12-11 NOTE — Telephone Encounter (Signed)
Printed avs and calender of upcoming appointment. Per 9/12 los 

## 2017-12-13 ENCOUNTER — Encounter: Payer: Self-pay | Admitting: Internal Medicine

## 2017-12-15 ENCOUNTER — Inpatient Hospital Stay: Payer: Medicare HMO

## 2017-12-15 VITALS — BP 130/80 | HR 91 | Temp 98.3°F | Resp 18

## 2017-12-15 DIAGNOSIS — C9000 Multiple myeloma not having achieved remission: Secondary | ICD-10-CM | POA: Diagnosis not present

## 2017-12-15 MED ORDER — ZOLEDRONIC ACID 4 MG/100ML IV SOLN
4.0000 mg | Freq: Once | INTRAVENOUS | Status: AC
  Administered 2017-12-15: 4 mg via INTRAVENOUS
  Filled 2017-12-15: qty 100

## 2017-12-15 NOTE — Patient Instructions (Signed)

## 2017-12-17 DIAGNOSIS — Z87442 Personal history of urinary calculi: Secondary | ICD-10-CM | POA: Diagnosis not present

## 2017-12-21 IMAGING — RF DG ESOPHAGUS
7 series · 14 of 24 positions shown · non-contrast
Comparison: None

CLINICAL DATA: Dysphagia

EXAM:
ESOPHOGRAM / BARIUM SWALLOW / BARIUM TABLET STUDY
TECHNIQUE: Combined double contrast and single contrast examination performed
using effervescent crystals, thick barium liquid, and thin barium
liquid. The patient was observed with fluoroscopy swallowing a 13 mm
barium sulphate tablet.
FLUOROSCOPY TIME:  Fluoroscopy Time:  1 minutes 54 seconds
Radiation Exposure Index (if provided by the fluoroscopic device):
387 mGy
Number of Acquired Spot Images: 0

[Series 1: sequence · 2 of 10 frames shown (1 of 5)]
[frame 2/10]
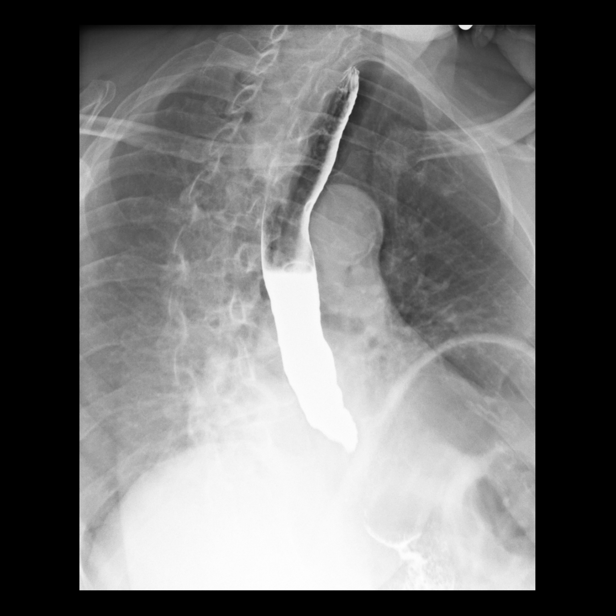
[frame 6/10]
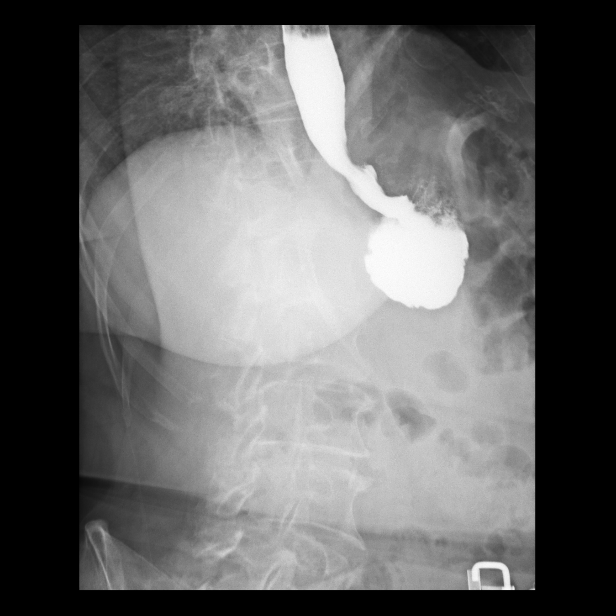

[Series 2: sequence · 2 of 9 frames shown (2 of 5)]
[frame 2/9]
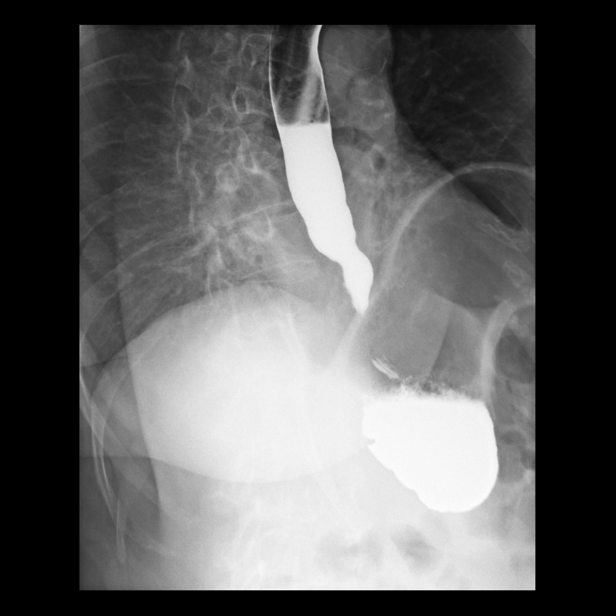
[frame 8/9]
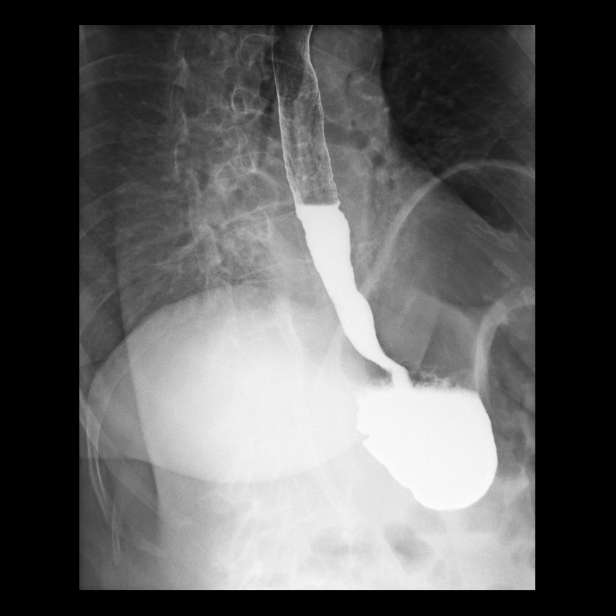

[Series 3: sequence · 2 of 21 frames shown (3 of 5)]
[frame 4/21]
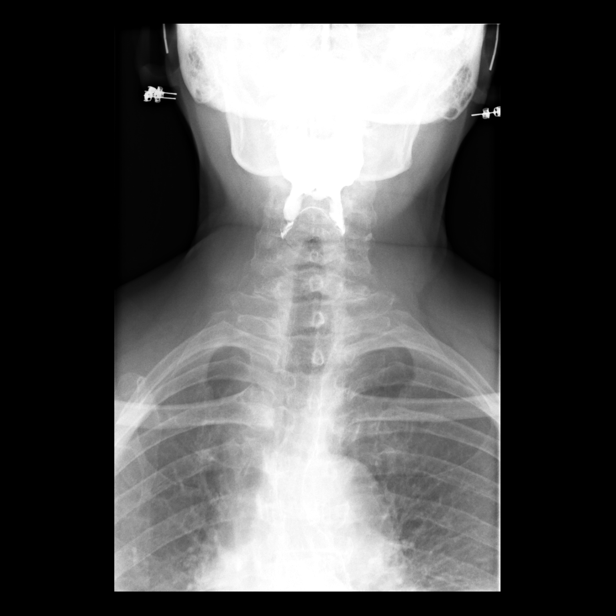
[frame 18/21]
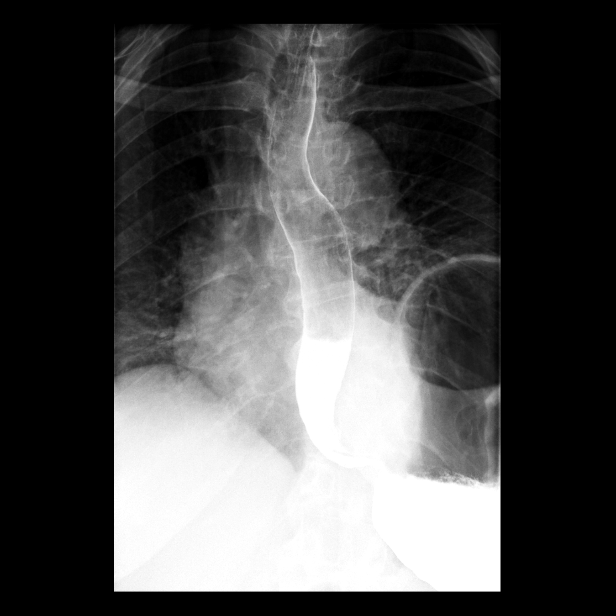

[Series 4: sequence · 3 of 13 frames shown (4 of 5)]
[frame 2/13]
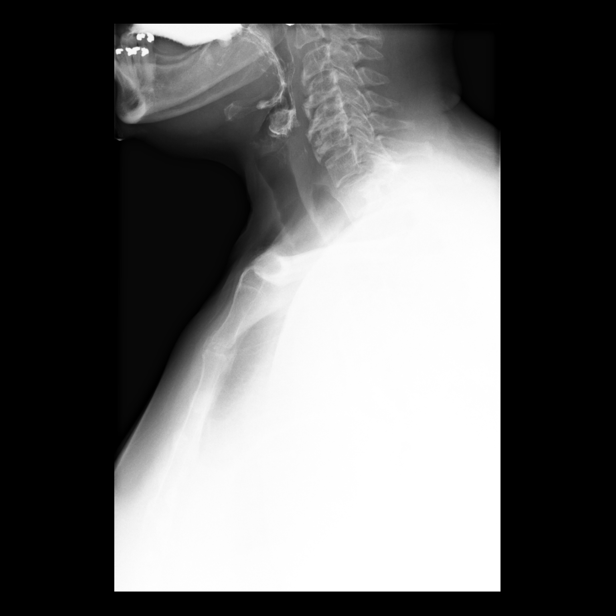
[frame 7/13]
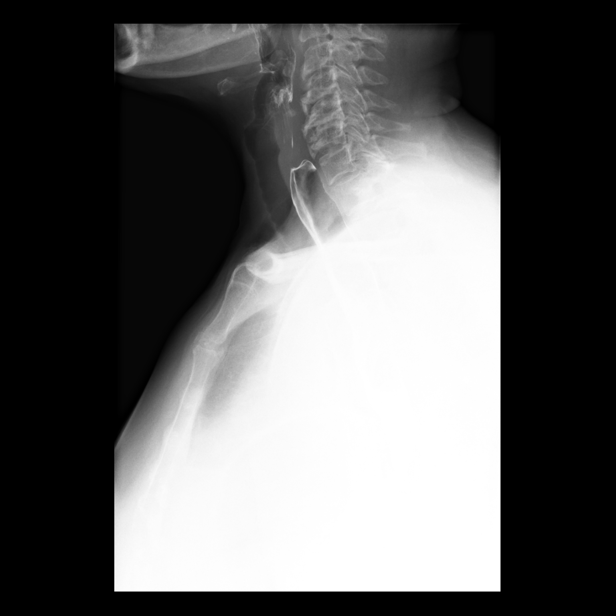
[frame 12/13]
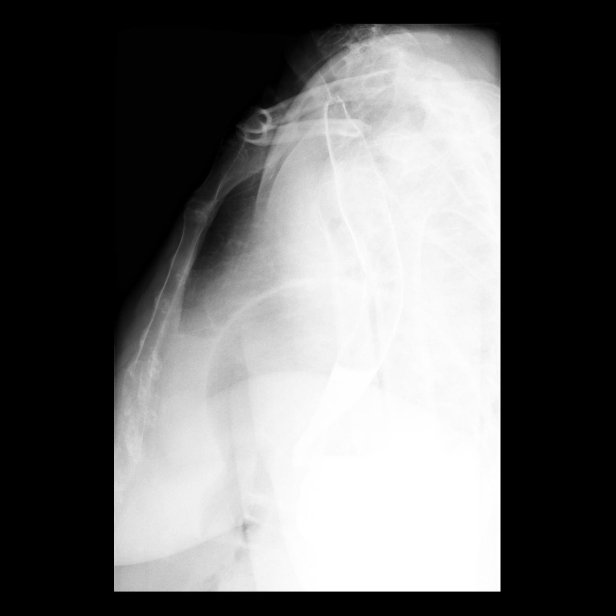

[Series 5: one shot · 1 of 3 slices shown (1 of 2)]
[im 2/3]
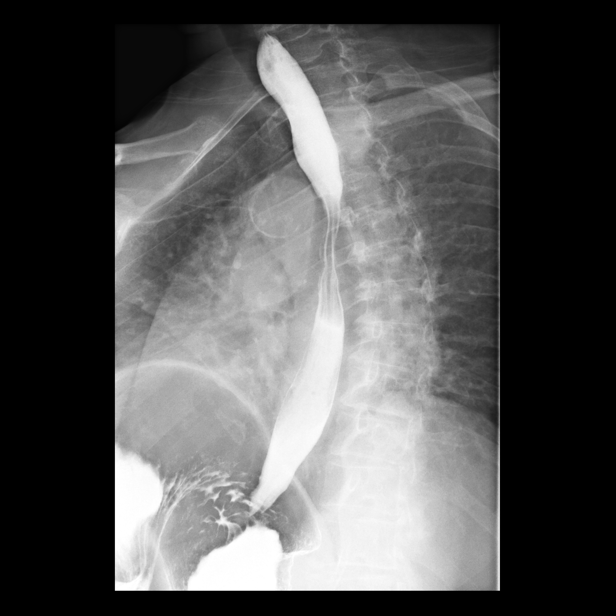

[Series 6: sequence · 2 of 70 frames shown (5 of 5)]
[frame 29/70]
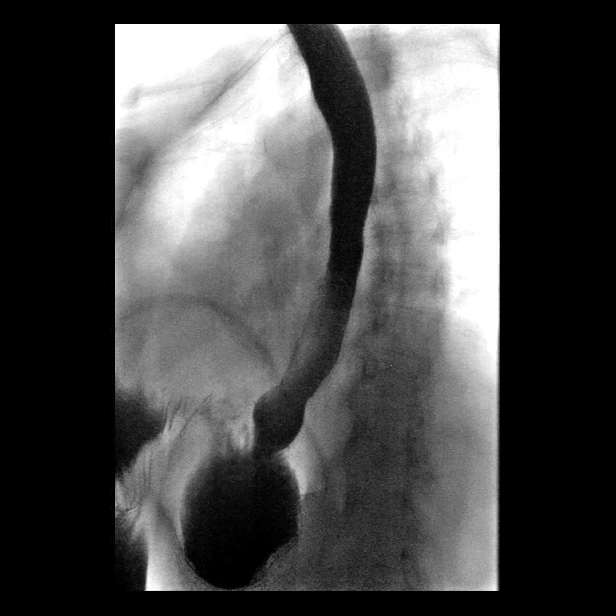
[frame 36/70]
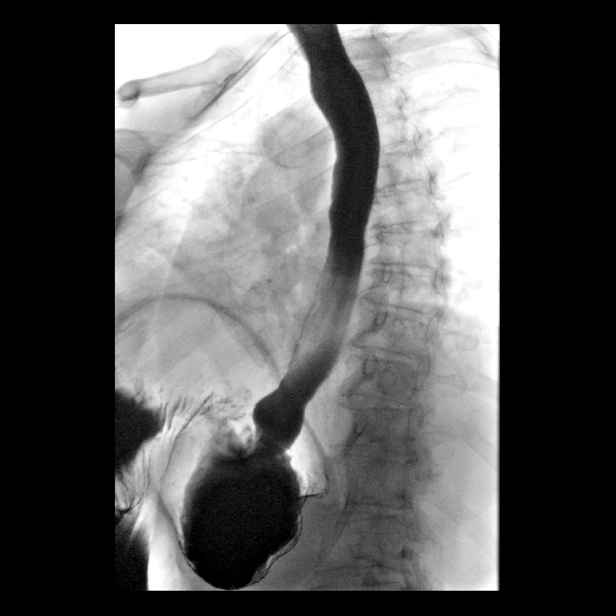

[Series 7: one shot · 2 of 3 slices shown (2 of 2)]
[im 1/3]
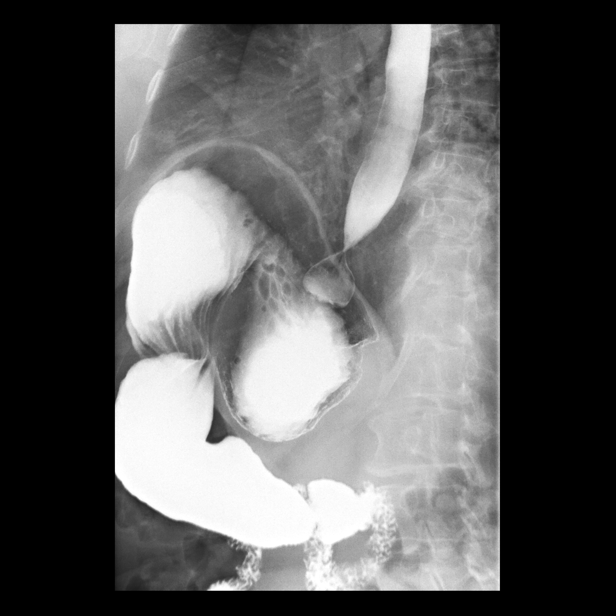
[im 3/3]
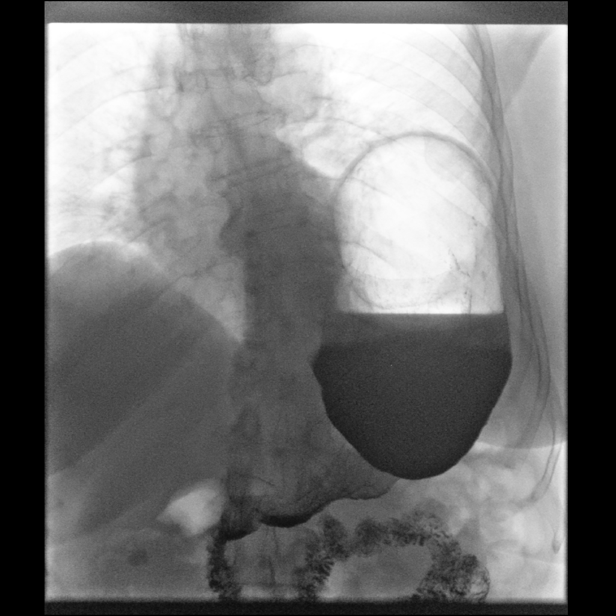

[14 of 24 positions shown; findings below may reference images not displayed]

FINDINGS: Initially a double-contrast barium swallow was performed. The mucosa
of the esophagus is unremarkable. A single contrast study shows the
swallowing mechanism to be normal. Moderate tertiary contractions
are noted in the distal esophagus. No hiatal hernia or
gastroesophageal reflux is seen. A barium pill was given at the end
of the study which passed into the stomach without delay. The left
hemidiaphragm is noted to be elevated with the stomach lying just
beneath the left hemidiaphragm. However, no hiatal hernia is seen.
IMPRESSION: 1. Moderate tertiary contractions in the mid and distal esophagus.
2. No hiatal hernia or gastroesophageal reflux is seen.
3. Elevation of the left hemidiaphragm of questionable significance.

## 2017-12-31 ENCOUNTER — Telehealth: Payer: Self-pay | Admitting: Medical Oncology

## 2017-12-31 ENCOUNTER — Other Ambulatory Visit: Payer: Self-pay | Admitting: Medical Oncology

## 2017-12-31 DIAGNOSIS — C9000 Multiple myeloma not having achieved remission: Secondary | ICD-10-CM

## 2017-12-31 DIAGNOSIS — R197 Diarrhea, unspecified: Secondary | ICD-10-CM

## 2017-12-31 MED ORDER — ALLOPURINOL 300 MG PO TABS: 300.0000 mg | ORAL_TABLET | Freq: Every day | ORAL | 0 refills | Status: DC

## 2017-12-31 MED ORDER — PANTOPRAZOLE SODIUM 40 MG PO TBEC: 40.0000 mg | DELAYED_RELEASE_TABLET | Freq: Every day | ORAL | 0 refills | Status: AC

## 2017-12-31 MED ORDER — LOPERAMIDE HCL 2 MG PO CAPS: ORAL_CAPSULE | ORAL | 1 refills | Status: DC

## 2017-12-31 NOTE — Telephone Encounter (Signed)
LVM to return my call and tell me name of med she needs refilled.

## 2017-12-31 NOTE — Telephone Encounter (Signed)
I LVM that 3/4 meds refilled and she needs to contact provider who prescribed Ativan. It is not clear on our list.

## 2018-01-02 ENCOUNTER — Other Ambulatory Visit: Payer: Self-pay | Admitting: Internal Medicine

## 2018-01-02 DIAGNOSIS — C9000 Multiple myeloma not having achieved remission: Secondary | ICD-10-CM

## 2018-01-06 ENCOUNTER — Other Ambulatory Visit: Payer: Self-pay | Admitting: *Deleted

## 2018-01-06 DIAGNOSIS — C9000 Multiple myeloma not having achieved remission: Secondary | ICD-10-CM

## 2018-01-06 MED ORDER — GABAPENTIN 600 MG PO TABS: 600.0000 mg | ORAL_TABLET | Freq: Two times a day (BID) | ORAL | 0 refills | Status: DC

## 2018-01-08 DIAGNOSIS — R197 Diarrhea, unspecified: Secondary | ICD-10-CM | POA: Diagnosis not present

## 2018-01-08 DIAGNOSIS — Z9481 Bone marrow transplant status: Secondary | ICD-10-CM | POA: Diagnosis not present

## 2018-01-08 DIAGNOSIS — Z23 Encounter for immunization: Secondary | ICD-10-CM | POA: Diagnosis not present

## 2018-01-08 DIAGNOSIS — Z8619 Personal history of other infectious and parasitic diseases: Secondary | ICD-10-CM | POA: Diagnosis not present

## 2018-01-08 DIAGNOSIS — C9000 Multiple myeloma not having achieved remission: Secondary | ICD-10-CM | POA: Diagnosis not present

## 2018-02-09 ENCOUNTER — Other Ambulatory Visit: Payer: Self-pay | Admitting: Medical Oncology

## 2018-02-11 ENCOUNTER — Telehealth: Payer: Self-pay | Admitting: Medical Oncology

## 2018-02-11 NOTE — Telephone Encounter (Signed)
Gabapentin dose clarification- what is her dose and frequency? Her phone is busy .

## 2018-02-18 ENCOUNTER — Other Ambulatory Visit: Payer: Self-pay | Admitting: Internal Medicine

## 2018-02-18 DIAGNOSIS — Z1231 Encounter for screening mammogram for malignant neoplasm of breast: Secondary | ICD-10-CM

## 2018-03-05 ENCOUNTER — Inpatient Hospital Stay: Payer: Medicare HMO | Attending: Internal Medicine

## 2018-03-05 DIAGNOSIS — K219 Gastro-esophageal reflux disease without esophagitis: Secondary | ICD-10-CM | POA: Insufficient documentation

## 2018-03-05 DIAGNOSIS — Z79899 Other long term (current) drug therapy: Secondary | ICD-10-CM | POA: Insufficient documentation

## 2018-03-05 DIAGNOSIS — R197 Diarrhea, unspecified: Secondary | ICD-10-CM | POA: Diagnosis not present

## 2018-03-05 DIAGNOSIS — C9000 Multiple myeloma not having achieved remission: Secondary | ICD-10-CM | POA: Diagnosis present

## 2018-03-05 LAB — CMP (CANCER CENTER ONLY)
ALT: 14 U/L (ref 0–44)
AST: 17 U/L (ref 15–41)
Albumin: 4.1 g/dL (ref 3.5–5.0)
Alkaline Phosphatase: 56 U/L (ref 38–126)
Anion gap: 7 (ref 5–15)
BUN: 12 mg/dL (ref 8–23)
CO2: 26 mmol/L (ref 22–32)
Calcium: 9.4 mg/dL (ref 8.9–10.3)
Chloride: 108 mmol/L (ref 98–111)
Creatinine: 0.94 mg/dL (ref 0.44–1.00)
GFR, Est AFR Am: >60 mL/min (ref >60)
GFR, Estimated: 59 mL/min — ABNORMAL LOW (ref >60)
Glucose, Bld: 87 mg/dL (ref 70–99)
Potassium: 3.9 mmol/L (ref 3.5–5.1)
Sodium: 141 mmol/L (ref 135–145)
Total Bilirubin: 0.5 mg/dL (ref 0.3–1.2)
Total Protein: 7.7 g/dL (ref 6.5–8.1)

## 2018-03-05 LAB — CBC WITH DIFFERENTIAL (CANCER CENTER ONLY)
Abs Immature Granulocytes: 0.01 10*3/uL (ref 0.00–0.07)
Basophils Absolute: 0 10*3/uL (ref 0.0–0.1)
Basophils Relative: 0 %
Eosinophils Absolute: 0 10*3/uL (ref 0.0–0.5)
Eosinophils Relative: 1 %
HCT: 35.3 % — ABNORMAL LOW (ref 36.0–46.0)
Hemoglobin: 11 g/dL — ABNORMAL LOW (ref 12.0–15.0)
Immature Granulocytes: 0 %
Lymphocytes Relative: 37 %
Lymphs Abs: 1.9 10*3/uL (ref 0.7–4.0)
MCH: 31.4 pg (ref 26.0–34.0)
MCHC: 31.2 g/dL (ref 30.0–36.0)
MCV: 100.9 fL — ABNORMAL HIGH (ref 80.0–100.0)
Monocytes Absolute: 0.5 10*3/uL (ref 0.1–1.0)
Monocytes Relative: 9 %
Neutro Abs: 2.8 10*3/uL (ref 1.7–7.7)
Neutrophils Relative %: 53 %
Platelet Count: 198 10*3/uL (ref 150–400)
RBC: 3.5 MIL/uL — ABNORMAL LOW (ref 3.87–5.11)
RDW: 14 % (ref 11.5–15.5)
WBC Count: 5.2 10*3/uL (ref 4.0–10.5)
nRBC: 0 % (ref 0.0–0.2)

## 2018-03-05 LAB — LACTATE DEHYDROGENASE: LDH: 194 U/L — ABNORMAL HIGH (ref 98–192)

## 2018-03-06 LAB — BETA 2 MICROGLOBULIN, SERUM: Beta-2 Microglobulin: 1.4 mg/L (ref 0.6–2.4)

## 2018-03-06 LAB — KAPPA/LAMBDA LIGHT CHAINS
Kappa free light chain: 34.6 mg/L — ABNORMAL HIGH (ref 3.3–19.4)
Kappa, lambda light chain ratio: 2.42 — ABNORMAL HIGH (ref 0.26–1.65)
Lambda free light chains: 14.3 mg/L (ref 5.7–26.3)

## 2018-03-06 LAB — IGG, IGA, IGM
IgA: 355 mg/dL (ref 64–422)
IgG (Immunoglobin G), Serum: 1314 mg/dL (ref 700–1600)
IgM (Immunoglobulin M), Srm: 29 mg/dL (ref 26–217)

## 2018-03-11 DIAGNOSIS — K219 Gastro-esophageal reflux disease without esophagitis: Secondary | ICD-10-CM | POA: Diagnosis not present

## 2018-03-11 DIAGNOSIS — K2270 Barrett's esophagus without dysplasia: Secondary | ICD-10-CM | POA: Diagnosis not present

## 2018-03-11 DIAGNOSIS — K8020 Calculus of gallbladder without cholecystitis without obstruction: Secondary | ICD-10-CM | POA: Diagnosis not present

## 2018-03-11 DIAGNOSIS — K7689 Other specified diseases of liver: Secondary | ICD-10-CM | POA: Diagnosis not present

## 2018-03-12 ENCOUNTER — Inpatient Hospital Stay: Payer: Medicare HMO

## 2018-03-12 ENCOUNTER — Encounter: Payer: Self-pay | Admitting: Internal Medicine

## 2018-03-12 ENCOUNTER — Inpatient Hospital Stay (HOSPITAL_BASED_OUTPATIENT_CLINIC_OR_DEPARTMENT_OTHER): Payer: Medicare HMO | Admitting: Internal Medicine

## 2018-03-12 ENCOUNTER — Telehealth: Payer: Self-pay | Admitting: Internal Medicine

## 2018-03-12 DIAGNOSIS — C9000 Multiple myeloma not having achieved remission: Secondary | ICD-10-CM

## 2018-03-12 DIAGNOSIS — K219 Gastro-esophageal reflux disease without esophagitis: Secondary | ICD-10-CM | POA: Diagnosis not present

## 2018-03-12 DIAGNOSIS — Z79899 Other long term (current) drug therapy: Secondary | ICD-10-CM | POA: Diagnosis not present

## 2018-03-12 DIAGNOSIS — R197 Diarrhea, unspecified: Secondary | ICD-10-CM | POA: Diagnosis not present

## 2018-03-12 MED ORDER — ZOLEDRONIC ACID 4 MG/100ML IV SOLN
4.0000 mg | Freq: Once | INTRAVENOUS | Status: AC
  Administered 2018-03-12: 4 mg via INTRAVENOUS
  Filled 2018-03-12: qty 100

## 2018-03-12 MED ORDER — GABAPENTIN 600 MG PO TABS: 600.0000 mg | ORAL_TABLET | Freq: Two times a day (BID) | ORAL | 0 refills | Status: DC

## 2018-03-12 MED ORDER — SODIUM CHLORIDE 0.9 % IV SOLN
Freq: Once | INTRAVENOUS | Status: AC
  Administered 2018-03-12: 13:00:00 via INTRAVENOUS
  Filled 2018-03-12: qty 250

## 2018-03-12 NOTE — Patient Instructions (Signed)

## 2018-03-12 NOTE — Progress Notes (Signed)
[place] Telephone:(336) [phone]   Fax:(336) [place], MD [address] [facility] [address]  DIAGNOSIS: Multiple myeloma, Light chain diagnosed in August 2010  PRIOR THERAPY: 1) status post systemic chemotherapy with Velcade, Doxil and Decadron between 04/24/2009 through 07/27/2009 discontinued secondary to peripheral neuropathy. 2) status post treatment with Revlimid and Decadron between 09/01/2009 through December 2011. 3) status post peripheral blood autologous stem cell transplant on 04/26/2010 at [facility] [place] under the care of Dr. Miki Kins. 4) maintenance chemotherapy with Revlimid 10 mg by mouth daily started June 2012. This will be changed to 5 mg by mouth daily starting next week secondary to persistent diarrhea.  CURRENT THERAPY: 1) Zometa 4 mg IV every 3 months.  INTERVAL HISTORY: Carmen Fisher 77 y.o. female returns to the clinic today for 3 months follow-up visit.  The patient is feeling fine today with no concerning complaints except for acid reflux and diarrhea.  She is currently under the evaluation of her gastroenterologist in [facility].  The patient denied having any recent weight loss or night sweats.  She has no nausea, vomiting, or constipation.  She has no chest pain, shortness of breath, cough or hemoptysis.  She is here today for evaluation after repeating myeloma panel.  MEDICAL HISTORY: Past Medical History:  Diagnosis Date  . Dyslipidemia   . GERD (gastroesophageal reflux disease)   . Gout    09-05-2017  per pt stable,  last flare-up long time ago  . H/O autologous stem cell transplant ([place]) 04-26-2010   @ [facility]  . History of acute pyelonephritis 08/16/2017  . History of chemotherapy 04-24-2009  to 07-27-2009   systemic  . History of sepsis    09/ 2014 secondary to UTI/  08-16-2017  SIRS, severe sepsis due to acute pyelonephritis with kidney stone obstruction  .  Hypokalemia   . Intermittent diarrhea   . Multiple myeloma in remission [facility]) oncologist-  dr Julien Nordmann ([facility] ) and dr Miki Kins at [facility]   dx 08/ 2010-- [place], completed chemo 07-27-2009 , s/p  high dose melphalan and autologous stem cell transplant 04-26-2010 @ [facility],  maintenance oral chemo (revlimid) started 06/ 2012--stopped 03/ 2019 due to neutropenia and diarrhea,  Zometa IV every 3 months  . Osteoarthritis   . Peripheral neuropathy due to chemotherapy ([place])   . Renal calculus, right   . Right ureteral calculus   . Wears dentures    upper  . Wears glasses     ALLERGIES:  is allergic to aspirin; ampicillin; sulfa antibiotics; and penicillins.  MEDICATIONS:  Current Outpatient Medications  Medication Sig Dispense Refill  . acetaminophen (TYLENOL) 500 MG tablet Take 500 mg by mouth every 6 (six) hours as needed.    Marland Kitchen allopurinol (ZYLOPRIM) 300 MG tablet TAKE 1 TABLET DAILY 90 tablet 0  . cholecalciferol (VITAMIN D) 1000 UNITS tablet Take 1,000 Units by mouth daily.    . dorzolamide-timolol (COSOPT) 22.3-6.8 MG/ML ophthalmic solution Place 1 drop into both eyes 2 (two) times daily.     Marland Kitchen gabapentin (NEURONTIN) 600 MG tablet Take 1 tablet (600 mg total) by mouth 2 (two) times daily. 180 tablet 0  . latanoprost (XALATAN) 0.005 % ophthalmic solution Place 1 drop into both eyes at bedtime.     Marland Kitchen loperamide (IMODIUM) 2 MG capsule TAKE 1 CAPSULE AS NEEDED   FOR DIARRHEA OR LOOSE STOOL(TAKE UP TO 6 CAPSULES  DAILY) 30 capsule 1  . LORazepam (ATIVAN) 1 MG tablet Take 1 mg by mouth every 4 (four) hours as needed (neuropathy). Reported on 09/14/2015    . Multiple Vitamin (MULTIVITAMIN WITH MINERALS) TABS tablet Take 1 tablet by mouth daily.    . pantoprazole (PROTONIX) 40 MG tablet Take 1 tablet (40 mg total) by mouth daily. 90 tablet 0  . potassium chloride SA (KLOR-CON M20) 20 MEQ tablet TAKE 1 AND 1/2 TABLETS     DAILY.--- TAKE 1 TABLET IN THE MORNING AND1/2 TABLET IN  THE EVENING 135 tablet 0  . HYDROcodone-acetaminophen (NORCO) 10-325 MG tablet Take 1-2 tablets by mouth every 4 (four) hours as needed for moderate pain. Maximum dose per 24 hours - 8 pills (Patient not taking: Reported on 03/12/2018) 14 tablet 0   No current facility-administered medications for this visit.     SURGICAL HISTORY:  Past Surgical History:  Procedure Laterality Date  . APPENDECTOMY  1990s  . CARPAL TUNNEL RELEASE Left 10-14-2001   [facility]   AND A-1 PULLEY RELEASE LEFT RING FINGER  . CYSTOSCOPY W/ URETERAL STENT PLACEMENT Right 08/16/2017   Procedure: CYSTOSCOPY WITH RETROGRADE PYELOGRAM/URETERAL STENT PLACEMENT;  Surgeon: Ardis Hughs, MD;  Location: [place];  Service: Urology;  Laterality: Right;  . CYSTOSCOPY/RETROGRADE/URETEROSCOPY Right 09/08/2017   Procedure: CYSTOSCOPY/RETROGRADE/URETEROSCOPY/STONE EXTRACTION/ STENT EXCHANGE, STONE BASKET RETRIVAL, LASER;  Surgeon: Kathie Rhodes, MD;  Location: [facility];  Service: Urology;  Laterality: Right;  Marland Kitchen VAGINAL HYSTERECTOMY  1994   W/  BSO    REVIEW OF SYSTEMS:  A comprehensive review of systems was negative except for: Gastrointestinal: positive for diarrhea and reflux symptoms   PHYSICAL EXAMINATION: General appearance: alert, cooperative and no distress Head: Normocephalic, without obvious abnormality, atraumatic Neck: no adenopathy, no JVD, supple, symmetrical, trachea midline and thyroid not enlarged, symmetric, no tenderness/mass/nodules Lymph nodes: Cervical, supraclavicular, and axillary nodes normal. Resp: clear to auscultation bilaterally Back: symmetric, no curvature. ROM normal. No CVA tenderness. Cardio: regular rate and rhythm, S1, S2 normal, no murmur, click, rub or gallop GI: soft, non-tender; bowel sounds normal; no masses,  no organomegaly Extremities: extremities normal, atraumatic, no cyanosis or edema  ECOG PERFORMANCE STATUS: 1 - Symptomatic but completely ambulatory  Blood  pressure 140/80, pulse 71, temperature 98 F (36.7 C), temperature source Oral, resp. rate 18, height _0  (1.651 m), weight 164 lb (74.4 kg), SpO2 95 %.  LABORATORY DATA: Lab Results  Component Value Date   WBC 5.2 03/05/2018   HGB 11.0 (L) 03/05/2018   HCT 35.3 (L) 03/05/2018   MCV 100.9 (H) 03/05/2018   PLT 198 03/05/2018      Chemistry      Component Value Date/Time   NA 141 03/05/2018 1119   NA 141 03/06/2017 1024   K 3.9 03/05/2018 1119   K 3.9 03/06/2017 1024   CL 108 03/05/2018 1119   CL 106 07/28/2012 0924   CO2 26 03/05/2018 1119   CO2 25 03/06/2017 1024   BUN 12 03/05/2018 1119   BUN 14.3 03/06/2017 1024   CREATININE 0.94 03/05/2018 1119   CREATININE 1.0 03/06/2017 1024      Component Value Date/Time   CALCIUM 9.4 03/05/2018 1119   CALCIUM 9.8 03/06/2017 1024   ALKPHOS 56 03/05/2018 1119   ALKPHOS 74 03/06/2017 1024   AST 17 03/05/2018 1119   AST 16 03/06/2017 1024   ALT 14 03/05/2018 1119   ALT 15 03/06/2017 1024   BILITOT 0.5 03/05/2018 1119  BILITOT 0.57 03/06/2017 1024       RADIOGRAPHIC STUDIES: No results found.  ASSESSMENT AND PLAN:  This is a very pleasant 77 years old African-American female with multiple myeloma and has been on maintenance Revlimid currently 5 mg by mouth daily and has been on this treatment for almost 6 years now. Her treatment was discontinued secondary to neutropenia and persistent diarrhea.  The patient continues to have diarrhea even after discontinuing Revlimid more than a year ago. She had repeat myeloma panel performed recently.  Her labs did not show any evidence for disease progression. I recommended for her to continue on observation with repeat myeloma panel in 4 months. For the reflux symptoms and diarrhea she will continue her current care by her gastroenterologist. For the bone disease from multiple myeloma, she will continue treatment with Zometa every 3 months. The patient was advised to call immediately if  she has any concerning symptoms in the interval. The patient voices understanding of current disease status and treatment options and is in agreement with the current care plan.  All questions were answered. The patient knows to call the clinic with any problems, questions or concerns. We can certainly see the patient much sooner if necessary.  Disclaimer: This note was dictated with voice recognition software. Similar sounding words can inadvertently be transcribed and may not be corrected upon review.

## 2018-03-12 NOTE — Telephone Encounter (Signed)
Printed calendar and avs. °

## 2018-03-16 MED ORDER — PALONOSETRON HCL INJECTION 0.25 MG/5ML
INTRAVENOUS | Status: AC
  Filled 2018-03-16: qty 5

## 2018-03-16 MED ORDER — FAMOTIDINE IN NACL 20-0.9 MG/50ML-% IV SOLN
INTRAVENOUS | Status: AC
  Filled 2018-03-16: qty 50

## 2018-03-16 MED ORDER — DIPHENHYDRAMINE HCL 50 MG/ML IJ SOLN
INTRAMUSCULAR | Status: AC
  Filled 2018-03-16: qty 1

## 2018-03-19 ENCOUNTER — Other Ambulatory Visit: Payer: Self-pay | Admitting: Internal Medicine

## 2018-03-19 DIAGNOSIS — C9000 Multiple myeloma not having achieved remission: Secondary | ICD-10-CM

## 2018-03-30 ENCOUNTER — Ambulatory Visit
Admission: RE | Admit: 2018-03-30 | Discharge: 2018-03-30 | Disposition: A | Discharge status: Home / Self Care | Payer: Medicare HMO | Source: Ambulatory Visit | Attending: Internal Medicine | Admitting: Internal Medicine

## 2018-03-30 DIAGNOSIS — Z1231 Encounter for screening mammogram for malignant neoplasm of breast: Secondary | ICD-10-CM

## 2018-03-30 MED ORDER — FAMOTIDINE IN NACL 20-0.9 MG/50ML-% IV SOLN
INTRAVENOUS | Status: AC
  Filled 2018-03-30: qty 50

## 2018-03-30 MED ORDER — PALONOSETRON HCL INJECTION 0.25 MG/5ML
INTRAVENOUS | Status: AC
  Filled 2018-03-30: qty 5

## 2018-03-30 MED ORDER — DIPHENHYDRAMINE HCL 50 MG/ML IJ SOLN
INTRAMUSCULAR | Status: AC
  Filled 2018-03-30: qty 1

## 2018-04-14 DIAGNOSIS — N390 Urinary tract infection, site not specified: Secondary | ICD-10-CM | POA: Diagnosis not present

## 2018-04-17 DIAGNOSIS — Z0000 Encounter for general adult medical examination without abnormal findings: Secondary | ICD-10-CM | POA: Diagnosis not present

## 2018-04-17 DIAGNOSIS — N183 Chronic kidney disease, stage 3 (moderate): Secondary | ICD-10-CM | POA: Diagnosis not present

## 2018-04-17 DIAGNOSIS — R030 Elevated blood-pressure reading, without diagnosis of hypertension: Secondary | ICD-10-CM | POA: Diagnosis not present

## 2018-04-17 DIAGNOSIS — D649 Anemia, unspecified: Secondary | ICD-10-CM | POA: Diagnosis not present

## 2018-04-17 DIAGNOSIS — G629 Polyneuropathy, unspecified: Secondary | ICD-10-CM | POA: Diagnosis not present

## 2018-04-20 DIAGNOSIS — Z01818 Encounter for other preprocedural examination: Secondary | ICD-10-CM | POA: Diagnosis not present

## 2018-04-20 DIAGNOSIS — K2270 Barrett's esophagus without dysplasia: Secondary | ICD-10-CM | POA: Diagnosis not present

## 2018-04-20 MED ORDER — FAMOTIDINE IN NACL 20-0.9 MG/50ML-% IV SOLN
INTRAVENOUS | Status: AC
  Filled 2018-04-20: qty 50

## 2018-04-20 MED ORDER — PALONOSETRON HCL INJECTION 0.25 MG/5ML
INTRAVENOUS | Status: AC
  Filled 2018-04-20: qty 5

## 2018-04-20 MED ORDER — DIPHENHYDRAMINE HCL 50 MG/ML IJ SOLN
INTRAMUSCULAR | Status: AC
  Filled 2018-04-20: qty 1

## 2018-04-24 DIAGNOSIS — E559 Vitamin D deficiency, unspecified: Secondary | ICD-10-CM | POA: Diagnosis not present

## 2018-04-24 DIAGNOSIS — C9000 Multiple myeloma not having achieved remission: Secondary | ICD-10-CM | POA: Diagnosis not present

## 2018-04-24 DIAGNOSIS — M8589 Other specified disorders of bone density and structure, multiple sites: Secondary | ICD-10-CM | POA: Diagnosis not present

## 2018-04-24 DIAGNOSIS — K219 Gastro-esophageal reflux disease without esophagitis: Secondary | ICD-10-CM | POA: Diagnosis not present

## 2018-04-24 DIAGNOSIS — G629 Polyneuropathy, unspecified: Secondary | ICD-10-CM | POA: Diagnosis not present

## 2018-04-24 DIAGNOSIS — M8580 Other specified disorders of bone density and structure, unspecified site: Secondary | ICD-10-CM | POA: Diagnosis not present

## 2018-04-27 MED ORDER — ONDANSETRON HCL 4 MG/2ML IJ SOLN
INTRAMUSCULAR | Status: AC
  Filled 2018-04-27: qty 4

## 2018-04-27 MED ORDER — DEXAMETHASONE SODIUM PHOSPHATE 10 MG/ML IJ SOLN
INTRAMUSCULAR | Status: AC
  Filled 2018-04-27: qty 1

## 2018-05-06 MED ORDER — ONDANSETRON HCL 4 MG/2ML IJ SOLN
INTRAMUSCULAR | Status: AC
  Filled 2018-05-06: qty 4

## 2018-05-06 MED ORDER — DEXAMETHASONE SODIUM PHOSPHATE 10 MG/ML IJ SOLN
INTRAMUSCULAR | Status: AC
  Filled 2018-05-06: qty 1

## 2018-05-12 ENCOUNTER — Other Ambulatory Visit (HOSPITAL_COMMUNITY): Payer: Self-pay | Admitting: Gastroenterology

## 2018-05-12 ENCOUNTER — Other Ambulatory Visit: Payer: Self-pay | Admitting: Gastroenterology

## 2018-05-12 DIAGNOSIS — K449 Diaphragmatic hernia without obstruction or gangrene: Secondary | ICD-10-CM | POA: Diagnosis not present

## 2018-05-12 DIAGNOSIS — K2270 Barrett's esophagus without dysplasia: Secondary | ICD-10-CM | POA: Diagnosis not present

## 2018-05-20 ENCOUNTER — Ambulatory Visit (HOSPITAL_COMMUNITY)
Admission: RE | Admit: 2018-05-20 | Discharge: 2018-05-20 | Disposition: A | Discharge status: Home / Self Care | Payer: Medicare Other | Source: Ambulatory Visit | Attending: Gastroenterology | Admitting: Gastroenterology

## 2018-05-20 ENCOUNTER — Other Ambulatory Visit (HOSPITAL_COMMUNITY): Payer: Self-pay | Admitting: Gastroenterology

## 2018-05-20 DIAGNOSIS — K449 Diaphragmatic hernia without obstruction or gangrene: Secondary | ICD-10-CM | POA: Diagnosis not present

## 2018-05-20 DIAGNOSIS — K224 Dyskinesia of esophagus: Secondary | ICD-10-CM | POA: Diagnosis not present

## 2018-05-21 DIAGNOSIS — H40011 Open angle with borderline findings, low risk, right eye: Secondary | ICD-10-CM | POA: Diagnosis not present

## 2018-05-21 DIAGNOSIS — H349 Unspecified retinal vascular occlusion: Secondary | ICD-10-CM | POA: Diagnosis not present

## 2018-06-11 ENCOUNTER — Other Ambulatory Visit: Payer: Self-pay

## 2018-06-11 ENCOUNTER — Ambulatory Visit: Payer: Medicare HMO

## 2018-06-11 ENCOUNTER — Inpatient Hospital Stay: Payer: Medicare Other

## 2018-06-11 ENCOUNTER — Inpatient Hospital Stay: Payer: Medicare Other | Attending: Internal Medicine

## 2018-06-11 VITALS — BP 132/75 | HR 65 | Temp 97.9°F | Resp 18

## 2018-06-11 DIAGNOSIS — C9000 Multiple myeloma not having achieved remission: Secondary | ICD-10-CM

## 2018-06-11 LAB — CBC WITH DIFFERENTIAL/PLATELET
Abs Immature Granulocytes: 0.01 10*3/uL (ref 0.00–0.07)
Basophils Absolute: 0 10*3/uL (ref 0.0–0.1)
Basophils Relative: 0 %
Eosinophils Absolute: 0.1 10*3/uL (ref 0.0–0.5)
Eosinophils Relative: 1 %
HCT: 35.7 % — ABNORMAL LOW (ref 36.0–46.0)
Hemoglobin: 11.2 g/dL — ABNORMAL LOW (ref 12.0–15.0)
Immature Granulocytes: 0 %
Lymphocytes Relative: 40 %
Lymphs Abs: 2.1 10*3/uL (ref 0.7–4.0)
MCH: 31.5 pg (ref 26.0–34.0)
MCHC: 31.4 g/dL (ref 30.0–36.0)
MCV: 100.6 fL — ABNORMAL HIGH (ref 80.0–100.0)
Monocytes Absolute: 0.5 10*3/uL (ref 0.1–1.0)
Monocytes Relative: 9 %
Neutro Abs: 2.6 10*3/uL (ref 1.7–7.7)
Neutrophils Relative %: 50 %
Platelets: 200 10*3/uL (ref 150–400)
RBC: 3.55 MIL/uL — ABNORMAL LOW (ref 3.87–5.11)
RDW: 13.9 % (ref 11.5–15.5)
WBC: 5.3 10*3/uL (ref 4.0–10.5)
nRBC: 0 % (ref 0.0–0.2)

## 2018-06-11 LAB — BASIC METABOLIC PANEL
Anion gap: 9 (ref 5–15)
BUN: 13 mg/dL (ref 8–23)
CO2: 24 mmol/L (ref 22–32)
Calcium: 9.1 mg/dL (ref 8.9–10.3)
Chloride: 109 mmol/L (ref 98–111)
Creatinine, Ser: 0.8 mg/dL (ref 0.44–1.00)
GFR calc Af Amer: >60 mL/min (ref >60)
GFR calc non Af Amer: >60 mL/min (ref >60)
Glucose, Bld: 93 mg/dL (ref 70–99)
Potassium: 3.9 mmol/L (ref 3.5–5.1)
Sodium: 142 mmol/L (ref 135–145)

## 2018-06-11 MED ORDER — SODIUM CHLORIDE 0.9 % IV SOLN
Freq: Once | INTRAVENOUS | Status: AC
  Administered 2018-06-11: 09:00:00 via INTRAVENOUS
  Filled 2018-06-11: qty 250

## 2018-06-11 MED ORDER — ZOLEDRONIC ACID 4 MG/100ML IV SOLN
4.0000 mg | Freq: Once | INTRAVENOUS | Status: AC
  Administered 2018-06-11: 4 mg via INTRAVENOUS
  Filled 2018-06-11: qty 100

## 2018-06-11 NOTE — Patient Instructions (Signed)

## 2018-06-18 DIAGNOSIS — H35033 Hypertensive retinopathy, bilateral: Secondary | ICD-10-CM | POA: Diagnosis not present

## 2018-06-18 DIAGNOSIS — H43813 Vitreous degeneration, bilateral: Secondary | ICD-10-CM | POA: Diagnosis not present

## 2018-06-18 DIAGNOSIS — H34831 Tributary (branch) retinal vein occlusion, right eye, with macular edema: Secondary | ICD-10-CM | POA: Diagnosis not present

## 2018-06-25 ENCOUNTER — Telehealth: Payer: Self-pay | Admitting: *Deleted

## 2018-06-25 NOTE — Telephone Encounter (Signed)
LVM advising patient that due to current COVID 19 pandemic, our office is severely reducing in person visits in order to minimize the risk to our patients and healthcare providers. We recommend to convert her appointment to a video visit. Requested she call back to discuss.

## 2018-06-25 NOTE — Telephone Encounter (Signed)
Reached patient and discussed video visit. She doesn't have a computer or smart phone, she agreed to reschedule un June,  verbalized understanding, appreciation.

## 2018-06-25 NOTE — Telephone Encounter (Signed)
LVM requesting call back.

## 2018-06-25 NOTE — Telephone Encounter (Signed)
Pt returned RN's call °

## 2018-06-29 ENCOUNTER — Institutional Professional Consult (permissible substitution): Payer: Self-pay | Admitting: Diagnostic Neuroimaging

## 2018-06-29 MED ORDER — DEXAMETHASONE SODIUM PHOSPHATE 10 MG/ML IJ SOLN
INTRAMUSCULAR | Status: AC
  Filled 2018-06-29: qty 1

## 2018-06-29 MED ORDER — PALONOSETRON HCL INJECTION 0.25 MG/5ML
INTRAVENOUS | Status: AC
  Filled 2018-06-29: qty 5

## 2018-07-06 ENCOUNTER — Inpatient Hospital Stay: Payer: Medicare Other | Attending: Internal Medicine

## 2018-07-06 ENCOUNTER — Other Ambulatory Visit: Payer: Self-pay

## 2018-07-06 DIAGNOSIS — C9000 Multiple myeloma not having achieved remission: Secondary | ICD-10-CM

## 2018-07-06 LAB — CBC WITH DIFFERENTIAL (CANCER CENTER ONLY)
Abs Immature Granulocytes: 0.05 10*3/uL (ref 0.00–0.07)
Basophils Absolute: 0 10*3/uL (ref 0.0–0.1)
Basophils Relative: 0 %
Eosinophils Absolute: 0 10*3/uL (ref 0.0–0.5)
Eosinophils Relative: 0 %
HCT: 37.2 % (ref 36.0–46.0)
Hemoglobin: 11.6 g/dL — ABNORMAL LOW (ref 12.0–15.0)
Immature Granulocytes: 0 %
Lymphocytes Relative: 15 %
Lymphs Abs: 1.9 10*3/uL (ref 0.7–4.0)
MCH: 31.9 pg (ref 26.0–34.0)
MCHC: 31.2 g/dL (ref 30.0–36.0)
MCV: 102.2 fL — ABNORMAL HIGH (ref 80.0–100.0)
Monocytes Absolute: 0.8 10*3/uL (ref 0.1–1.0)
Monocytes Relative: 7 %
Neutro Abs: 9.5 10*3/uL — ABNORMAL HIGH (ref 1.7–7.7)
Neutrophils Relative %: 78 %
Platelet Count: 217 10*3/uL (ref 150–400)
RBC: 3.64 MIL/uL — ABNORMAL LOW (ref 3.87–5.11)
RDW: 13.9 % (ref 11.5–15.5)
WBC Count: 12.3 10*3/uL — ABNORMAL HIGH (ref 4.0–10.5)
nRBC: 0 % (ref 0.0–0.2)

## 2018-07-06 LAB — CMP (CANCER CENTER ONLY)
ALT: 16 U/L (ref 0–44)
AST: 18 U/L (ref 15–41)
Albumin: 4.2 g/dL (ref 3.5–5.0)
Alkaline Phosphatase: 56 U/L (ref 38–126)
Anion gap: 8 (ref 5–15)
BUN: 13 mg/dL (ref 8–23)
CO2: 28 mmol/L (ref 22–32)
Calcium: 9.6 mg/dL (ref 8.9–10.3)
Chloride: 105 mmol/L (ref 98–111)
Creatinine: 1 mg/dL (ref 0.44–1.00)
GFR, Est AFR Am: >60 mL/min (ref >60)
GFR, Estimated: 55 mL/min — ABNORMAL LOW (ref >60)
Glucose, Bld: 86 mg/dL (ref 70–99)
Potassium: 4.3 mmol/L (ref 3.5–5.1)
Sodium: 141 mmol/L (ref 135–145)
Total Bilirubin: 0.5 mg/dL (ref 0.3–1.2)
Total Protein: 7.9 g/dL (ref 6.5–8.1)

## 2018-07-06 LAB — LACTATE DEHYDROGENASE: LDH: 238 U/L — ABNORMAL HIGH (ref 98–192)

## 2018-07-07 LAB — KAPPA/LAMBDA LIGHT CHAINS
Kappa free light chain: 32 mg/L — ABNORMAL HIGH (ref 3.3–19.4)
Kappa, lambda light chain ratio: 2.48 — ABNORMAL HIGH (ref 0.26–1.65)
Lambda free light chains: 12.9 mg/L (ref 5.7–26.3)

## 2018-07-07 LAB — IGG, IGA, IGM
IgA: 361 mg/dL (ref 64–422)
IgG (Immunoglobin G), Serum: 1473 mg/dL (ref 586–1602)
IgM (Immunoglobulin M), Srm: 32 mg/dL (ref 26–217)

## 2018-07-07 LAB — BETA 2 MICROGLOBULIN, SERUM: Beta-2 Microglobulin: 1.9 mg/L (ref 0.6–2.4)

## 2018-07-10 ENCOUNTER — Telehealth: Payer: Self-pay | Admitting: Internal Medicine

## 2018-07-10 NOTE — Telephone Encounter (Signed)
Unable to reach patient per 4/10 sch message - left message for patient that appt has been changed to a phone call and they do not need to come in .

## 2018-07-13 ENCOUNTER — Encounter: Payer: Self-pay | Admitting: Internal Medicine

## 2018-07-13 ENCOUNTER — Inpatient Hospital Stay (HOSPITAL_BASED_OUTPATIENT_CLINIC_OR_DEPARTMENT_OTHER): Payer: Medicare Other | Admitting: Internal Medicine

## 2018-07-13 DIAGNOSIS — Z79899 Other long term (current) drug therapy: Secondary | ICD-10-CM | POA: Diagnosis not present

## 2018-07-13 DIAGNOSIS — C9000 Multiple myeloma not having achieved remission: Secondary | ICD-10-CM

## 2018-07-13 DIAGNOSIS — K219 Gastro-esophageal reflux disease without esophagitis: Secondary | ICD-10-CM | POA: Diagnosis not present

## 2018-07-13 DIAGNOSIS — R197 Diarrhea, unspecified: Secondary | ICD-10-CM

## 2018-07-13 MED ORDER — DEXAMETHASONE SODIUM PHOSPHATE 10 MG/ML IJ SOLN
INTRAMUSCULAR | Status: AC
  Filled 2018-07-13: qty 1

## 2018-07-13 MED ORDER — PALONOSETRON HCL INJECTION 0.25 MG/5ML
INTRAVENOUS | Status: AC
  Filled 2018-07-13: qty 5

## 2018-07-13 NOTE — Progress Notes (Signed)
Virtual Visit via Telephone Note  I connected with Carmen Fisher on 07/13/18 at 10:30 AM EDT by telephone and verified that I am speaking with the correct person using two identifiers.   I discussed the limitations, risks, security and privacy concerns of performing an evaluation and management service by telephone and the availability of in person appointments. I also discussed with the patient that there may be a patient responsible charge related to this service. The patient expressed understanding and agreed to proceed.       [place] Telephone:([phone]   Fax:(336) [place], MD [address] [place] [address]  DIAGNOSIS: Multiple myeloma, Light chain diagnosed in August 2010  PRIOR THERAPY: 1) status post systemic chemotherapy with Velcade, Doxil and Decadron between 04/24/2009 through 07/27/2009 discontinued secondary to peripheral neuropathy. 2) status post treatment with Revlimid and Decadron between 09/01/2009 through December 2011. 3) status post peripheral blood autologous stem cell transplant on 04/26/2010 at [facility] under the care of Dr. Miki Kins. 4) maintenance chemotherapy with Revlimid 10 mg by mouth daily started June 2012. This will be changed to 5 mg by mouth daily starting next week secondary to persistent diarrhea.  CURRENT THERAPY: 1) Zometa 4 mg IV every 3 months.  History of Present Illness: Carmen Fisher 78 y.o. female has a telephone visit today for evaluation and discussion of her lab results.  The patient is feeling fine today with no concerning complaints except for a few episodes of diarrhea and she is still taking Imodium on a as needed basis.  She denied having any fever or chills.  She has no nausea, vomiting or constipation.  The patient has no significant weight loss or night sweats.  She has no chest pain, shortness of breath, cough or hemoptysis.  She had repeat  myeloma panel performed recently and we have the telephone visit for discussion of her lab results and recommendation regarding her condition.  MEDICAL HISTORY: Past Medical History:  Diagnosis Date  . Dyslipidemia   . GERD (gastroesophageal reflux disease)   . Gout    09-05-2017  per pt stable,  last flare-up long time ago  . H/O autologous stem cell transplant ([place]) 04-26-2010   @ [facility]  . History of acute pyelonephritis 08/16/2017  . History of chemotherapy 04-24-2009  to 07-27-2009   systemic  . History of sepsis    09/ 2014 secondary to UTI/  08-16-2017  SIRS, severe sepsis due to acute pyelonephritis with kidney stone obstruction  . Hypokalemia   . Intermittent diarrhea   . Multiple myeloma in remission [facility]) oncologist-  dr Julien Nordmann ([facility] ) and dr Miki Kins at [facility]   dx 08/ 2010-- [place], completed chemo 07-27-2009 , s/p  high dose melphalan and autologous stem cell transplant 04-26-2010 @ [facility],  maintenance oral chemo (revlimid) started 06/ 2012--stopped 03/ 2019 due to neutropenia and diarrhea,  Zometa IV every 3 months  . Osteoarthritis   . Peripheral neuropathy due to chemotherapy ([place])   . Renal calculus, right   . Right ureteral calculus   . Wears dentures    upper  . Wears glasses     ALLERGIES:  is allergic to aspirin; ampicillin; sulfa antibiotics; and penicillins.  MEDICATIONS:  Current Outpatient Medications  Medication Sig Dispense Refill  . acetaminophen (TYLENOL) 500 MG tablet Take 500 mg by mouth every 6 (six) hours as needed.    Marland Kitchen allopurinol (ZYLOPRIM)  300 MG tablet TAKE 1 TABLET DAILY 90 tablet 0  . cholecalciferol (VITAMIN D) 1000 UNITS tablet Take 1,000 Units by mouth daily.    . dorzolamide-timolol (COSOPT) 22.3-6.8 MG/ML ophthalmic solution Place 1 drop into both eyes 2 (two) times daily.     Marland Kitchen gabapentin (NEURONTIN) 600 MG tablet TAKE 1 TABLET TWICE A DAY 180 tablet 0  . HYDROcodone-acetaminophen (NORCO) 10-325 MG tablet Take  1-2 tablets by mouth every 4 (four) hours as needed for moderate pain. Maximum dose per 24 hours - 8 pills (Patient not taking: Reported on 03/12/2018) 14 tablet 0  . latanoprost (XALATAN) 0.005 % ophthalmic solution Place 1 drop into both eyes at bedtime.     Marland Kitchen loperamide (IMODIUM) 2 MG capsule TAKE 1 CAPSULE AS NEEDED   FOR DIARRHEA OR LOOSE STOOL(TAKE UP TO 6 CAPSULES     DAILY) 30 capsule 1  . LORazepam (ATIVAN) 1 MG tablet Take 1 mg by mouth every 4 (four) hours as needed (neuropathy). Reported on 09/14/2015    . Multiple Vitamin (MULTIVITAMIN WITH MINERALS) TABS tablet Take 1 tablet by mouth daily.    . pantoprazole (PROTONIX) 40 MG tablet Take 1 tablet (40 mg total) by mouth daily. 90 tablet 0  . potassium chloride SA (KLOR-CON M20) 20 MEQ tablet TAKE 1 AND 1/2 TABLETS     DAILY.--- TAKE 1 TABLET IN THE MORNING AND 1/2 TABLET IN THE EVENING 135 tablet 0   No current facility-administered medications for this visit.     SURGICAL HISTORY:  Past Surgical History:  Procedure Laterality Date  . APPENDECTOMY  1990s  . CARPAL TUNNEL RELEASE Left 10-14-2001   [facility]   AND A-1 PULLEY RELEASE LEFT RING FINGER  . CYSTOSCOPY W/ URETERAL STENT PLACEMENT Right 08/16/2017   Procedure: CYSTOSCOPY WITH RETROGRADE PYELOGRAM/URETERAL STENT PLACEMENT;  Surgeon: Ardis Hughs, MD;  Location: [place];  Service: Urology;  Laterality: Right;  . CYSTOSCOPY/RETROGRADE/URETEROSCOPY Right 09/08/2017   Procedure: CYSTOSCOPY/RETROGRADE/URETEROSCOPY/STONE EXTRACTION/ STENT EXCHANGE, STONE BASKET RETRIVAL, LASER;  Surgeon: Kathie Rhodes, MD;  Location: [facility];  Service: Urology;  Laterality: Right;  Marland Kitchen VAGINAL HYSTERECTOMY  1994   W/  BSO    REVIEW OF SYSTEMS:  A comprehensive review of systems was negative except for: Gastrointestinal: positive for diarrhea   LABORATORY DATA: Lab Results  Component Value Date   WBC 12.3 (H) 07/06/2018   HGB 11.6 (L) 07/06/2018   HCT 37.2 07/06/2018   MCV  102.2 (H) 07/06/2018   PLT 217 07/06/2018      Chemistry      Component Value Date/Time   NA 141 07/06/2018 1055   NA 141 03/06/2017 1024   K 4.3 07/06/2018 1055   K 3.9 03/06/2017 1024   CL 105 07/06/2018 1055   CL 106 07/28/2012 0924   CO2 28 07/06/2018 1055   CO2 25 03/06/2017 1024   BUN 13 07/06/2018 1055   BUN 14.3 03/06/2017 1024   CREATININE 1.00 07/06/2018 1055   CREATININE 1.0 03/06/2017 1024      Component Value Date/Time   CALCIUM 9.6 07/06/2018 1055   CALCIUM 9.8 03/06/2017 1024   ALKPHOS 56 07/06/2018 1055   ALKPHOS 74 03/06/2017 1024   AST 18 07/06/2018 1055   AST 16 03/06/2017 1024   ALT 16 07/06/2018 1055   ALT 15 03/06/2017 1024   BILITOT 0.5 07/06/2018 1055   BILITOT 0.57 03/06/2017 1024       RADIOGRAPHIC STUDIES: No results found.  ASSESSMENT AND PLAN:  This is a very pleasant 78 years old African-American female with multiple myeloma and has been on maintenance Revlimid currently 5 mg by mouth daily and has been on this treatment for almost 6 years now. Her treatment was discontinued secondary to neutropenia and persistent diarrhea.   The patient is currently on observation and she is feeling fine with no concerning complaints except for the intermittent diarrhea. She had repeat myeloma panel performed recently.  I discussed the lab results with the patient today.  There is no concerning findings for disease progression. I recommended for her to continue on observation with repeat myeloma panel in 6 months. For the reflux symptoms and diarrhea she will continue her current care by her gastroenterologist. For the bone disease from multiple myeloma, she will continue treatment with Zometa every 3 months. The patient was advised to call immediately if she has any concerning symptoms in the interval.  Follow Up Instructions: Lab and follow-up visit in 6 months.   I discussed the assessment and treatment plan with the patient. The patient was  provided an opportunity to ask questions and all were answered. The patient agreed with the plan and demonstrated an understanding of the instructions.   The patient was advised to call back or seek an in-person evaluation if the symptoms worsen or if the condition fails to improve as anticipated.  I provided 12 minutes of non-face-to-face time during this encounter.   Eilleen Kempf, MD

## 2018-07-27 MED ORDER — DEXAMETHASONE SODIUM PHOSPHATE 10 MG/ML IJ SOLN
INTRAMUSCULAR | Status: AC
  Filled 2018-07-27: qty 1

## 2018-07-27 MED ORDER — PALONOSETRON HCL INJECTION 0.25 MG/5ML
INTRAVENOUS | Status: AC
  Filled 2018-07-27: qty 5

## 2018-08-04 DIAGNOSIS — Q791 Other congenital malformations of diaphragm: Secondary | ICD-10-CM | POA: Diagnosis not present

## 2018-08-04 DIAGNOSIS — R1310 Dysphagia, unspecified: Secondary | ICD-10-CM | POA: Diagnosis not present

## 2018-08-10 MED ORDER — PALONOSETRON HCL INJECTION 0.25 MG/5ML
INTRAVENOUS | Status: AC
  Filled 2018-08-10: qty 5

## 2018-08-10 MED ORDER — DEXAMETHASONE SODIUM PHOSPHATE 10 MG/ML IJ SOLN
INTRAMUSCULAR | Status: AC
  Filled 2018-08-10: qty 1

## 2018-08-25 MED ORDER — OXYCODONE-ACETAMINOPHEN 5-325 MG PO TABS
ORAL_TABLET | ORAL | Status: AC
  Filled 2018-08-25: qty 1

## 2018-08-25 NOTE — Telephone Encounter (Signed)
LVM advising patient that her Fri appointment needs to be rescheduled to a Tues afternoon if she agrees to in office visit. Advised Dr Leta Baptist has openings Tues June 2nd.  Left number.

## 2018-09-01 ENCOUNTER — Encounter: Payer: Self-pay | Admitting: *Deleted

## 2018-09-01 NOTE — Telephone Encounter (Signed)
Spoke with patient and advised her that due to current COVID 19 pandemic, our office is still severely reducing in person visits in order to minimize the risk to our patients and healthcare providers.  We had spoken in March, and she stated she has no means for video visit.  I advised her that her appt needs to be rescheduled to a Tues afternoon for in office. We rescheduled, updated EMR. I advised her of procedure for in office visit, advised she have mobile with her. She  verbalized understanding, appreciation. Carmen Fisher

## 2018-09-01 NOTE — Telephone Encounter (Signed)
Spoke with patient and advised

## 2018-09-01 NOTE — Addendum Note (Signed)
Addended by: Minna Antis on: 09/01/2018 09:10 AM   Modules accepted: Orders

## 2018-09-04 ENCOUNTER — Ambulatory Visit: Payer: Self-pay | Admitting: Diagnostic Neuroimaging

## 2018-09-08 ENCOUNTER — Encounter: Payer: Self-pay | Admitting: Diagnostic Neuroimaging

## 2018-09-08 ENCOUNTER — Other Ambulatory Visit: Payer: Self-pay

## 2018-09-08 ENCOUNTER — Ambulatory Visit (INDEPENDENT_AMBULATORY_CARE_PROVIDER_SITE_OTHER): Payer: Medicare Other | Admitting: Diagnostic Neuroimaging

## 2018-09-08 VITALS — BP 142/81 | HR 76 | Temp 97.7°F | Ht 65.0 in | Wt 162.8 lb

## 2018-09-08 DIAGNOSIS — M79641 Pain in right hand: Secondary | ICD-10-CM | POA: Diagnosis not present

## 2018-09-08 DIAGNOSIS — G620 Drug-induced polyneuropathy: Secondary | ICD-10-CM

## 2018-09-08 DIAGNOSIS — G5601 Carpal tunnel syndrome, right upper limb: Secondary | ICD-10-CM

## 2018-09-08 NOTE — Progress Notes (Signed)
[facility]  PATIENT: Carmen Fisher DOB: 11/04/1940  REFERRING CLINICIAN: Audie Pinto, MD HISTORY FROM: patient and chart review  REASON FOR VISIT: follow up   HISTORICAL  CHIEF COMPLAINT:  Chief Complaint  Patient presents with  . Polyneuropathy    rm 6, "neuropathy in right arm, hand, feet; hand bothers me th emost"    HISTORY OF PRESENT ILLNESS:   UPDATE (09/08/18, VRP): Since last visit, continues with pain in right hand, wrist and fingers.  Symptoms symptoms radiate up her right arm.  Also has some milder numbness and tingling in left hand and bilateral feet.  Continues on gabapentin 600 mg twice a day.  Now is willing to consider EMG testing and possible carpal tunnel surgery if confirmed.  PRIOR HPI (06/09/17): 78 year old female here for evaluation of right hand numbness.  2011 patient had chemotherapy for multiple myeloma and developed peripheral neuropathy.  She developed numbness and tingling in her toes and bilateral hands.  At some point she was started on gabapentin for painful neuropathy related to chemotherapy.  Over the past 2 years symptoms have continued to get worse.  Particularly in her right hand.  Patient had previous left carpal tunnel surgery in 2003.  Now she has similar symptoms in her right hand.  This affects her right hand, mainly digits 1-4.  This wakes her up at nighttime.  Patient currently on gabapentin 600 mg twice a day with mild relief.   REVIEW OF SYSTEMS: Full 14 system review of systems performed and negative with exception of: Joint pain cramps aching muscles diarrhea easy bruising blurred vision memory loss not enough sleep.  ALLERGIES: Allergies  Allergen Reactions  . Aspirin Anaphylaxis and Swelling  . Ampicillin Rash  . Sulfa Antibiotics Hives and Swelling  . Penicillins Itching and Rash    Has patient had a PCN reaction causing immediate rash, facial/tongue/throat swelling, SOB or lightheadedness with hypotension:  Yes Has patient had a PCN reaction causing severe rash involving mucus membranes or skin necrosis: No Has patient had a PCN reaction that required hospitalization: No Has patient had a PCN reaction occurring within the last 10 years: No If all of the above answers are "NO", then may proceed with Cephalosporin use.   Rash and itching Has received Zosyn and Augmentin in th    HOME MEDICATIONS: Outpatient Medications Prior to Visit  Medication Sig Dispense Refill  . acetaminophen (TYLENOL) 500 MG tablet Take 500 mg by mouth every 6 (six) hours as needed.    Marland Kitchen allopurinol (ZYLOPRIM) 300 MG tablet TAKE 1 TABLET DAILY 90 tablet 0  . cholecalciferol (VITAMIN D) 1000 UNITS tablet Take 1,000 Units by mouth daily.    . dorzolamide-timolol (COSOPT) 22.3-6.8 MG/ML ophthalmic solution Place 1 drop into both eyes 2 (two) times daily.     Marland Kitchen gabapentin (NEURONTIN) 300 MG capsule 600 mg 2 (two) times a day.    Marland Kitchen HYDROcodone-acetaminophen (NORCO) 10-325 MG tablet Take 1-2 tablets by mouth every 4 (four) hours as needed for moderate pain. Maximum dose per 24 hours - 8 pills 14 tablet 0  . latanoprost (XALATAN) 0.005 % ophthalmic solution Place 1 drop into both eyes at bedtime.     Marland Kitchen loperamide (IMODIUM) 2 MG capsule TAKE 1 CAPSULE AS NEEDED   FOR DIARRHEA OR LOOSE STOOL(TAKE UP TO 6 CAPSULES     DAILY) 30 capsule 1  . LORazepam (ATIVAN) 1 MG tablet Take 1 mg by mouth every 4 (four) hours as needed (neuropathy).  Reported on 09/14/2015    . Multiple Vitamin (MULTIVITAMIN WITH MINERALS) TABS tablet Take 1 tablet by mouth daily.    . pantoprazole (PROTONIX) 40 MG tablet Take 1 tablet (40 mg total) by mouth daily. 90 tablet 0  . potassium chloride SA (KLOR-CON M20) 20 MEQ tablet TAKE 1 AND 1/2 TABLETS     DAILY.--- TAKE 1 TABLET IN THE MORNING AND 1/2 TABLET IN THE EVENING 135 tablet 0   No facility-administered medications prior to visit.     PAST MEDICAL HISTORY: Past Medical History:  Diagnosis Date  .  Dyslipidemia   . GERD (gastroesophageal reflux disease)   . Gout    09-05-2017  per pt stable,  last flare-up long time ago  . H/O autologous stem cell transplant ([place]) 04-26-2010   @ [facility]  . History of acute pyelonephritis 08/16/2017  . History of chemotherapy 04-24-2009  to 07-27-2009   systemic  . History of sepsis    09/ 2014 secondary to UTI/  08-16-2017  SIRS, severe sepsis due to acute pyelonephritis with kidney stone obstruction  . Hypokalemia   . Intermittent diarrhea   . Multiple myeloma in remission [facility]) oncologist-  dr Julien Nordmann ([facility] ) and dr Miki Kins at [facility]   dx 08/ 2010-- [facility], completed chemo 07-27-2009 , s/p  high dose melphalan and autologous stem cell transplant 04-26-2010 @ [facility],  maintenance oral chemo (revlimid) started 06/ 2012--stopped 03/ 2019 due to neutropenia and diarrhea,  Zometa IV every 3 months  . Osteoarthritis   . Peripheral neuropathy due to chemotherapy ([place])   . Renal calculus, right   . Right ureteral calculus   . Wears dentures    upper  . Wears glasses     PAST SURGICAL HISTORY: Past Surgical History:  Procedure Laterality Date  . APPENDECTOMY  1990s  . CARPAL TUNNEL RELEASE Left 10-14-2001   [facility]   AND A-1 PULLEY RELEASE LEFT RING FINGER  . CYSTOSCOPY W/ URETERAL STENT PLACEMENT Right 08/16/2017   Procedure: CYSTOSCOPY WITH RETROGRADE PYELOGRAM/URETERAL STENT PLACEMENT;  Surgeon: Ardis Hughs, MD;  Location: [facility];  Service: Urology;  Laterality: Right;  . CYSTOSCOPY/RETROGRADE/URETEROSCOPY Right 09/08/2017   Procedure: CYSTOSCOPY/RETROGRADE/URETEROSCOPY/STONE EXTRACTION/ STENT EXCHANGE, STONE BASKET RETRIVAL, LASER;  Surgeon: Kathie Rhodes, MD;  Location[facility];  Service: Urology;  Laterality: Right;  Marland Kitchen VAGINAL HYSTERECTOMY  1994   W/  BSO    FAMILY HISTORY: Family History  Problem Relation Age of Onset  . Stroke Mother   . Hypertension Mother   . Cancer Father        throat  .  Cancer Sister        breast  . Sickle cell trait Sister   . Hypertension Other     SOCIAL HISTORY:  Social History   Socioeconomic History  . Marital status: Widowed    Spouse name: Not on file  . Number of children: 2  . Years of education: Not on file  . Highest education level: Not on file  Occupational History    Comment: retired- [employer]  . Financial resource strain: Not on file  . Food insecurity:    Worry: Not on file    Inability: Not on file  . Transportation needs:    Medical: Not on file    Non-medical: Not on file  Tobacco Use  . Smoking status: Never Smoker  . Smokeless tobacco: Never Used  Substance and Sexual Activity  . Alcohol  use: Yes    Comment: Occasionally  . Drug use: No  . Sexual activity: Not on file  Lifestyle  . Physical activity:    Days per week: Not on file    Minutes per session: Not on file  . Stress: Not on file  Relationships  . Social connections:    Talks on phone: Not on file    Gets together: Not on file    Attends religious service: Not on file    Active member of club or organization: Not on file    Attends meetings of clubs or organizations: Not on file    Relationship status: Not on file  . Intimate partner violence:    Fear of current or ex partner: Not on file    Emotionally abused: Not on file    Physically abused: Not on file    Forced sexual activity: Not on file  Other Topics Concern  . Not on file  Social History Narrative   ** Merged History Encounter **   Lives alone.  Children: one in [place], and [place].  Retired.     Little caffeine     PHYSICAL EXAM  GENERAL EXAM/CONSTITUTIONAL: Vitals:  Vitals:   09/08/18 1400  BP: (!) 142/81  Pulse: 76  Temp: 97.7 F (36.5 C)  Weight: 162 lb 12.8 oz (73.8 kg)  Height: 5' 5"  (1.651 m)   Body mass index is 27.09 kg/m. No exam data present  Patient is in no distress; well developed, nourished and groomed; neck is supple   CARDIOVASCULAR:  Examination of carotid arteries is normal; no carotid bruits  Regular rate and rhythm, no murmurs  Examination of peripheral vascular system by observation and palpation is normal  EYES:  Ophthalmoscopic exam of optic discs and posterior segments is normal; no papilledema or hemorrhages  MUSCULOSKELETAL:  Gait, strength, tone, movements noted in Neurologic exam below  NEUROLOGIC: MENTAL STATUS:  No flowsheet data found.  awake, alert, oriented to person, place and time  recent and remote memory intact  normal attention and concentration  language fluent, comprehension intact, naming intact,   fund of knowledge appropriate  CRANIAL NERVE:   2nd, 3rd, 4th, 6th - pupils equal and reactive to light, visual fields full to confrontation, extraocular muscles intact, no nystagmus  5th - facial sensation symmetric  7th - facial strength symmetric  8th - hearing intact  9th - palate elevates symmetrically, uvula midline  11th - shoulder shrug symmetric  12th - tongue protrusion midline  MASKED FACIES  MOTOR:   normal bulk and tone, full strength in the BUE, BLE; EXCEPT ATROPHY IN RIGHT > LEFT ABDUCTOR POLLICUS BREVIS  SENSORY:   normal and symmetric to light touch, pinprick, temperature, vibration; EXCEPT DECR PP AND VIB IN RIGHT HAND (DIGITS 1-4)  POSITIVE PHALEN'S IN RIGHT HAND  COORDINATION:   finger-nose-finger, fine finger movements normal  REFLEXES:   deep tendon reflexes TRACE and symmetric; ABSENT AT ANKLES  GAIT/STATION:   narrow based gait    DIAGNOSTIC DATA (LABS, IMAGING, TESTING) - I reviewed patient records, labs, notes, testing and imaging myself where available.  Lab Results  Component Value Date   WBC 12.3 (H) 07/06/2018   HGB 11.6 (L) 07/06/2018   HCT 37.2 07/06/2018   MCV 102.2 (H) 07/06/2018   PLT 217 07/06/2018      Component Value Date/Time   NA 141 07/06/2018 1055   NA 141 03/06/2017 1024   K 4.3  07/06/2018 1055   K 3.9  03/06/2017 1024   CL 105 07/06/2018 1055   CL 106 07/28/2012 0924   CO2 28 07/06/2018 1055   CO2 25 03/06/2017 1024   GLUCOSE 86 07/06/2018 1055   GLUCOSE 125 03/06/2017 1024   GLUCOSE 89 07/28/2012 0924   BUN 13 07/06/2018 1055   BUN 14.3 03/06/2017 1024   CREATININE 1.00 07/06/2018 1055   CREATININE 1.0 03/06/2017 1024   CALCIUM 9.6 07/06/2018 1055   CALCIUM 9.8 03/06/2017 1024   PROT 7.9 07/06/2018 1055   PROT 7.8 03/06/2017 1024   ALBUMIN 4.2 07/06/2018 1055   ALBUMIN 4.1 03/06/2017 1024   AST 18 07/06/2018 1055   AST 16 03/06/2017 1024   ALT 16 07/06/2018 1055   ALT 15 03/06/2017 1024   ALKPHOS 56 07/06/2018 1055   ALKPHOS 74 03/06/2017 1024   BILITOT 0.5 07/06/2018 1055   BILITOT 0.57 03/06/2017 1024   GFRNONAA 55 (L) 07/06/2018 1055   GFRAA >60 07/06/2018 1055   Lab Results  Component Value Date   CHOL  06/05/2008    155        ATP III CLASSIFICATION:  <200     mg/dL   Desirable  200-239  mg/dL   Borderline High  >=240    mg/dL   High          HDL 38 (L) 06/05/2008   LDLCALC  06/05/2008    92        Total Cholesterol/HDL:CHD Risk Coronary Heart Disease Risk Table                     Men   Women  1/2 Average Risk   3.4   3.3  Average Risk       5.0   4.4  2 X Average Risk   9.6   7.1  3 X Average Risk  23.4   11.0        Use the calculated Patient Ratio above and the CHD Risk Table to determine the patient's CHD Risk.        ATP III CLASSIFICATION (LDL):  <100     mg/dL   Optimal  100-129  mg/dL   Near or Above                    Optimal  130-159  mg/dL   Borderline  160-189  mg/dL   High  >190     mg/dL   Very High   TRIG 125 06/05/2008   CHOLHDL 4.1 06/05/2008   Lab Results  Component Value Date   HGBA1C 6.3 (H) 12/05/2012   Lab Results  Component Value Date   VITAMINB12 371 02/03/2009   Lab Results  Component Value Date   TSH 0.547 Test methodology is 3rd generation TSH 06/04/2008       ASSESSMENT AND  PLAN  78 y.o. 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  Most likely represents superimposed right carpal tunnel syndrome.    Ddx: carpal tunnel syndrome + chemotherapy neuropathy  1. Right hand pain   2. Right carpal tunnel syndrome   3. Chemotherapy-induced neuropathy (HCC)      PLAN:  RIGHT HAND NUMBNESS (suspected right carpal tunnel syndrome) - use carpal tunnel wrist splint at bedtime - may increase gabapentin to 660m three times a day  - check EMG/NCS  NUMBNESS IN BILATERAL HANDS AND FEET (  chemotherapy neuropathy) - continue gabapentin (may increase gabapentin to 643m three times a day)  Orders Placed This Encounter  Procedures  . NCV with EMG(electromyography)    Standing Status:   Future    Standing Expiration Date:   09/08/2019    Scheduling Instructions:     RUE CTS eval; BLE neuropathy eval    Order Specific Question:   Where should this test be performed?    Answer:   [facility]   Return for for NCV/EMG.    VPenni Bombard MD 67/05/1826 28:33PM Certified in Neurology, Neurophysiology and Neuroimaging  [facility] [address] [place] [phone]

## 2018-09-11 ENCOUNTER — Ambulatory Visit: Payer: Medicare Other

## 2018-09-11 ENCOUNTER — Inpatient Hospital Stay: Payer: Medicare Other

## 2018-09-11 ENCOUNTER — Inpatient Hospital Stay: Payer: Medicare Other | Attending: Internal Medicine

## 2018-09-11 ENCOUNTER — Inpatient Hospital Stay (HOSPITAL_BASED_OUTPATIENT_CLINIC_OR_DEPARTMENT_OTHER): Payer: Medicare Other | Admitting: Medical

## 2018-09-11 ENCOUNTER — Other Ambulatory Visit: Payer: Self-pay

## 2018-09-11 DIAGNOSIS — R42 Dizziness and giddiness: Secondary | ICD-10-CM

## 2018-09-11 DIAGNOSIS — C9000 Multiple myeloma not having achieved remission: Secondary | ICD-10-CM | POA: Diagnosis not present

## 2018-09-11 DIAGNOSIS — R197 Diarrhea, unspecified: Secondary | ICD-10-CM

## 2018-09-11 LAB — BASIC METABOLIC PANEL - CANCER CENTER ONLY
Anion gap: 8 (ref 5–15)
BUN: 15 mg/dL (ref 8–23)
CO2: 26 mmol/L (ref 22–32)
Calcium: 9.4 mg/dL (ref 8.9–10.3)
Chloride: 107 mmol/L (ref 98–111)
Creatinine: 0.87 mg/dL (ref 0.44–1.00)
GFR, Est AFR Am: >60 mL/min (ref >60)
GFR, Estimated: >60 mL/min (ref >60)
Glucose, Bld: 93 mg/dL (ref 70–99)
Potassium: 4.3 mmol/L (ref 3.5–5.1)
Sodium: 141 mmol/L (ref 135–145)

## 2018-09-11 MED ORDER — SODIUM CHLORIDE 0.9 % IV SOLN
INTRAVENOUS | Status: DC
  Administered 2018-09-11: 11:00:00 via INTRAVENOUS
  Filled 2018-09-11: qty 250

## 2018-09-11 MED ORDER — ZOLEDRONIC ACID 4 MG/100ML IV SOLN
4.0000 mg | Freq: Once | INTRAVENOUS | Status: AC
  Administered 2018-09-11: 4 mg via INTRAVENOUS
  Filled 2018-09-11: qty 100

## 2018-09-11 NOTE — Progress Notes (Signed)
Carmen Fisher was seen in the infusion room today.  She wanted to report that she had mild dizziness last Saturday morning that has resolved.  It only occurred on this 1 day.  She also wanted to report that she has some ongoing episodes of diarrhea.  She has Imodium at home.  Sandi Mealy, MHS, PA-C Physician Assistant

## 2018-09-11 NOTE — Patient Instructions (Signed)

## 2018-09-18 ENCOUNTER — Telehealth: Payer: Self-pay | Admitting: Medical Oncology

## 2018-09-18 ENCOUNTER — Other Ambulatory Visit: Payer: Self-pay | Admitting: Medical Oncology

## 2018-09-18 DIAGNOSIS — C9000 Multiple myeloma not having achieved remission: Secondary | ICD-10-CM

## 2018-09-18 MED ORDER — GABAPENTIN 600 MG PO TABS: 600.0000 mg | ORAL_TABLET | Freq: Two times a day (BID) | ORAL | 1 refills | Status: DC

## 2018-09-18 NOTE — Telephone Encounter (Signed)
Gabapentin refill-prefers 600 mg tablets BID vs 300 mg, 2  Capsules BID. The capsules get hung up in her throat. Refill sent.  Schedule message sent for zometa in 3 months and lab and MD visit in 6 months.

## 2018-09-21 MED ORDER — ALBUTEROL SULFATE HFA 108 (90 BASE) MCG/ACT IN AERS
INHALATION_SPRAY | RESPIRATORY_TRACT | Status: AC
  Filled 2018-09-21: qty 6.7

## 2018-09-21 MED ORDER — PALONOSETRON HCL INJECTION 0.25 MG/5ML
INTRAVENOUS | Status: AC
  Filled 2018-09-21: qty 5

## 2018-09-24 ENCOUNTER — Telehealth: Payer: Self-pay | Admitting: Internal Medicine

## 2018-09-24 NOTE — Telephone Encounter (Signed)
Scheduled appt per 6/24 sch message - unable to reach pt . Left message with appt date and time   

## 2018-10-01 ENCOUNTER — Other Ambulatory Visit: Payer: Self-pay

## 2018-10-01 ENCOUNTER — Ambulatory Visit (INDEPENDENT_AMBULATORY_CARE_PROVIDER_SITE_OTHER): Payer: Medicare Other | Admitting: Diagnostic Neuroimaging

## 2018-10-01 ENCOUNTER — Encounter: Payer: Medicare Other | Admitting: Diagnostic Neuroimaging

## 2018-10-01 DIAGNOSIS — M79641 Pain in right hand: Secondary | ICD-10-CM

## 2018-10-01 DIAGNOSIS — Z0289 Encounter for other administrative examinations: Secondary | ICD-10-CM

## 2018-10-01 NOTE — Procedures (Signed)
[facility]  NCS (NERVE CONDUCTION STUDY) WITH EMG (ELECTROMYOGRAPHY) REPORT   STUDY DATE: 10/01/18 PATIENT NAME: Carmen Fisher DOB: 1940-06-10 MRN: [id number]  ORDERING CLINICIAN: Andrey Spearman, MD   TECHNOLOGIST: Sherre Scarlet ELECTROMYOGRAPHER: Earlean Polka. Ceyda Peterka, MD  CLINICAL INFORMATION: 78 year old female with right hand and bilateral foot numbness.  FINDINGS: NERVE CONDUCTION STUDY:  Right median motor response has prolonged distal latency (12.2 ms) decreased amplitude and slow conduction velocity.  Right ulnar, right peroneal, right tibial motor responses are normal.  Left peroneal motor responses normal distal latency, normal amplitude, borderline slow conduction velocity.  Left tibial motor responses normal distal latency, decreased amplitude, normal conduction velocity.  Bilateral sural, superficial peroneal and right ulnar sensory responses have decreased amplitudes.  Right median sensory spots cannot be obtained.    Right median to ulnar transcarpal mixed nerve comparison has prolonged peak latency difference.   NEEDLE ELECTROMYOGRAPHY:  Needle examination of right upper extremity is normal.   IMPRESSION:   Abnormal study demonstrating: - Severe right median neuropathy at the wrist consistent with severe right carpal tunnel syndrome. - Underlying axonal sensorimotor polyneuropathy.    INTERPRETING PHYSICIAN:  Penni Bombard, MD Certified in Neurology, Neurophysiology and Neuroimaging  [facility] [address], [place], [place] [address] [phone]  [facility]    Nerve / Sites Muscle Latency Ref. Amplitude Ref. Rel Amp Segments Distance Velocity Ref. Area    ms ms mV mV %  cm m/s m/s mVms  R Median - APB     Wrist APB 12.2 ?4.4 0.6 ?4.0 100 Wrist - APB 7   2.9     Upper arm APB 17.9  0.5  97.3 Upper arm - Wrist 25 44 ?49 3.2  R Ulnar - ADM     Wrist ADM 2.7 ?3.3 8.2 ?6.0 100 Wrist - ADM 7   20.4      B.Elbow ADM 6.5  7.1  86.6 B.Elbow - Wrist 22 59 ?49 21.8     A.Elbow ADM 8.4  6.3  88.3 A.Elbow - B.Elbow 10 51 ?49 19.9         A.Elbow - Wrist      R Peroneal - EDB     Ankle EDB 4.2 ?6.5 4.6 ?2.0 100 Ankle - EDB 9   12.4     Fib head EDB 10.4  3.9  84.4 Fib head - Ankle 29 47 ?44 11.4     Pop fossa EDB 12.5  4.0  104 Pop fossa - Fib head 10 47 ?44 13.1         Pop fossa - Ankle      L Peroneal - EDB     Ankle EDB 4.0 ?6.5 4.3 ?2.0 100 Ankle - EDB 9   9.8     Fib head EDB 10.7  3.6  84 Fib head - Ankle 29 43 ?44 9.5     Pop fossa EDB 13.0  3.5  96.4 Pop fossa - Fib head 10 43 ?44 9.3         Pop fossa - Ankle      R Tibial - AH     Ankle AH 4.0 ?5.8 4.1 ?4.0 100 Ankle - AH 9   8.6     Pop fossa AH 13.1  3.0  73 Pop fossa - Ankle 39 43 ?41 5.8  L Tibial - AH     Ankle AH 3.9 ?5.8 3.9 ?4.0 100 Ankle - AH 9  12.1     Pop fossa AH 12.8  2.1  54.9 Pop fossa - Ankle 36 41 ?41 16.6                  7SNC    Nerve / Sites Rec. Site Peak Lat Ref.  Amp Ref. Segments Distance Peak Diff Ref.    ms ms V V  cm ms ms  R Sural - Ankle (Calf)     Calf Ankle 3.3 ?4.4 4 ?6 Calf - Ankle 14    L Sural - Ankle (Calf)     Calf Ankle 3.8 ?4.4 5 ?6 Calf - Ankle 14    R Superficial peroneal - Ankle     Lat leg Ankle 3.9 ?4.4 3 ?6 Lat leg - Ankle 14    L Superficial peroneal - Ankle     Lat leg Ankle 3.9 ?4.4 2 ?6 Lat leg - Ankle 14    R Median, Ulnar - Transcarpal comparison     Median Palm Wrist 4.8 ?2.2 4 ?35 Median Palm - Wrist 8       Ulnar Palm Wrist 2.3 ?2.2 20 ?12 Ulnar Palm - Wrist 8          Median Palm - Ulnar Palm  2.5 ?0.4  R Median - Orthodromic (Dig II, Mid palm)     Dig II Wrist NR ?3.4 NR ?10 Dig II - Wrist 13    R Ulnar - Orthodromic, (Dig V, Mid palm)     Dig V Wrist 3.1 ?3.1 3 ?5 Dig V - Wrist 31                     F  Wave    Nerve F Lat Ref.   ms ms  R Tibial - AH 55.2 ?56.0  L Tibial - AH 55.7 ?56.0  R Ulnar - ADM 30.2 ?32.0           EMG full       EMG  Summary Table    Spontaneous MUAP Recruitment  Muscle IA Fib PSW Fasc Other Amp Dur. Poly Pattern  R. Deltoid Normal None None None _______ Normal Normal Normal Normal  R. Triceps brachii Normal None None None _______ Normal Normal Normal Normal  R. Biceps brachii Normal None None None _______ Normal Normal Normal Normal  R. Flexor carpi radialis Normal None None None _______ Normal Normal Normal Normal  R. First dorsal interosseous Normal None None None _______ Normal Normal Normal Normal

## 2018-11-17 ENCOUNTER — Other Ambulatory Visit: Payer: Self-pay | Admitting: Internal Medicine

## 2018-11-17 DIAGNOSIS — C9000 Multiple myeloma not having achieved remission: Secondary | ICD-10-CM

## 2018-12-11 ENCOUNTER — Telehealth: Payer: Self-pay | Admitting: Medical Oncology

## 2018-12-11 ENCOUNTER — Other Ambulatory Visit: Payer: Self-pay

## 2018-12-11 ENCOUNTER — Inpatient Hospital Stay: Payer: Medicare Other

## 2018-12-11 ENCOUNTER — Inpatient Hospital Stay: Payer: Medicare Other | Attending: Internal Medicine

## 2018-12-11 ENCOUNTER — Encounter (INDEPENDENT_AMBULATORY_CARE_PROVIDER_SITE_OTHER): Payer: Self-pay

## 2018-12-11 VITALS — BP 122/77 | HR 68 | Temp 98.9°F | Resp 16

## 2018-12-11 DIAGNOSIS — C9000 Multiple myeloma not having achieved remission: Secondary | ICD-10-CM

## 2018-12-11 LAB — BASIC METABOLIC PANEL
Anion gap: 8 (ref 5–15)
BUN: 16 mg/dL (ref 8–23)
CO2: 25 mmol/L (ref 22–32)
Calcium: 9.4 mg/dL (ref 8.9–10.3)
Chloride: 108 mmol/L (ref 98–111)
Creatinine, Ser: 0.9 mg/dL (ref 0.44–1.00)
GFR calc Af Amer: >60 mL/min (ref >60)
GFR calc non Af Amer: >60 mL/min (ref >60)
Glucose, Bld: 89 mg/dL (ref 70–99)
Potassium: 4 mmol/L (ref 3.5–5.1)
Sodium: 141 mmol/L (ref 135–145)

## 2018-12-11 MED ORDER — ZOLEDRONIC ACID 4 MG/100ML IV SOLN
4.0000 mg | Freq: Once | INTRAVENOUS | Status: AC
  Administered 2018-12-11: 4 mg via INTRAVENOUS
  Filled 2018-12-11: qty 100

## 2018-12-11 MED ORDER — SODIUM CHLORIDE 0.9 % IV SOLN
INTRAVENOUS | Status: DC
  Administered 2018-12-11: 15:00:00 via INTRAVENOUS
  Filled 2018-12-11: qty 250

## 2018-12-11 NOTE — Telephone Encounter (Signed)
err

## 2018-12-11 NOTE — Patient Instructions (Signed)

## 2018-12-22 ENCOUNTER — Other Ambulatory Visit: Payer: Self-pay

## 2018-12-22 DIAGNOSIS — Z20822 Contact with and (suspected) exposure to covid-19: Secondary | ICD-10-CM

## 2018-12-24 LAB — NOVEL CORONAVIRUS, NAA: SARS-CoV-2, NAA: NOT DETECTED

## 2019-01-06 ENCOUNTER — Inpatient Hospital Stay: Payer: Medicare Other | Attending: Internal Medicine

## 2019-01-06 ENCOUNTER — Other Ambulatory Visit: Payer: Self-pay

## 2019-01-06 DIAGNOSIS — Z23 Encounter for immunization: Secondary | ICD-10-CM | POA: Insufficient documentation

## 2019-01-06 DIAGNOSIS — C9000 Multiple myeloma not having achieved remission: Secondary | ICD-10-CM | POA: Insufficient documentation

## 2019-01-06 LAB — CBC WITH DIFFERENTIAL (CANCER CENTER ONLY)
Abs Immature Granulocytes: 0.01 10*3/uL (ref 0.00–0.07)
Basophils Absolute: 0 10*3/uL (ref 0.0–0.1)
Basophils Relative: 0 %
Eosinophils Absolute: 0 10*3/uL (ref 0.0–0.5)
Eosinophils Relative: 1 %
HCT: 36.7 % (ref 36.0–46.0)
Hemoglobin: 11.7 g/dL — ABNORMAL LOW (ref 12.0–15.0)
Immature Granulocytes: 0 %
Lymphocytes Relative: 35 %
Lymphs Abs: 1.8 10*3/uL (ref 0.7–4.0)
MCH: 31.9 pg (ref 26.0–34.0)
MCHC: 31.9 g/dL (ref 30.0–36.0)
MCV: 100 fL (ref 80.0–100.0)
Monocytes Absolute: 0.4 10*3/uL (ref 0.1–1.0)
Monocytes Relative: 8 %
Neutro Abs: 2.8 10*3/uL (ref 1.7–7.7)
Neutrophils Relative %: 56 %
Platelet Count: 205 10*3/uL (ref 150–400)
RBC: 3.67 MIL/uL — ABNORMAL LOW (ref 3.87–5.11)
RDW: 13.7 % (ref 11.5–15.5)
WBC Count: 5.1 10*3/uL (ref 4.0–10.5)
nRBC: 0 % (ref 0.0–0.2)

## 2019-01-06 LAB — CMP (CANCER CENTER ONLY)
ALT: 12 U/L (ref 0–44)
AST: 16 U/L (ref 15–41)
Albumin: 4.2 g/dL (ref 3.5–5.0)
Alkaline Phosphatase: 56 U/L (ref 38–126)
Anion gap: 7 (ref 5–15)
BUN: 16 mg/dL (ref 8–23)
CO2: 28 mmol/L (ref 22–32)
Calcium: 9.8 mg/dL (ref 8.9–10.3)
Chloride: 105 mmol/L (ref 98–111)
Creatinine: 0.87 mg/dL (ref 0.44–1.00)
GFR, Est AFR Am: >60 mL/min (ref >60)
GFR, Estimated: >60 mL/min (ref >60)
Glucose, Bld: 91 mg/dL (ref 70–99)
Potassium: 4 mmol/L (ref 3.5–5.1)
Sodium: 140 mmol/L (ref 135–145)
Total Bilirubin: 0.5 mg/dL (ref 0.3–1.2)
Total Protein: 7.6 g/dL (ref 6.5–8.1)

## 2019-01-06 LAB — LACTATE DEHYDROGENASE: LDH: 193 U/L — ABNORMAL HIGH (ref 98–192)

## 2019-01-07 LAB — KAPPA/LAMBDA LIGHT CHAINS
Kappa free light chain: 35.9 mg/L — ABNORMAL HIGH (ref 3.3–19.4)
Kappa, lambda light chain ratio: 2.22 — ABNORMAL HIGH (ref 0.26–1.65)
Lambda free light chains: 16.2 mg/L (ref 5.7–26.3)

## 2019-01-07 LAB — BETA 2 MICROGLOBULIN, SERUM: Beta-2 Microglobulin: 1.6 mg/L (ref 0.6–2.4)

## 2019-01-07 LAB — IGG, IGA, IGM
IgA: 321 mg/dL (ref 64–422)
IgG (Immunoglobin G), Serum: 1341 mg/dL (ref 586–1602)
IgM (Immunoglobulin M), Srm: 37 mg/dL (ref 26–217)

## 2019-01-13 ENCOUNTER — Other Ambulatory Visit: Payer: Self-pay

## 2019-01-13 ENCOUNTER — Other Ambulatory Visit: Payer: Self-pay | Admitting: Medical Oncology

## 2019-01-13 ENCOUNTER — Inpatient Hospital Stay (HOSPITAL_BASED_OUTPATIENT_CLINIC_OR_DEPARTMENT_OTHER): Payer: Medicare Other | Admitting: Internal Medicine

## 2019-01-13 ENCOUNTER — Encounter: Payer: Self-pay | Admitting: Internal Medicine

## 2019-01-13 VITALS — BP 135/73 | HR 73 | Temp 97.8°F | Resp 18 | Ht 65.0 in | Wt 162.7 lb

## 2019-01-13 DIAGNOSIS — Z23 Encounter for immunization: Secondary | ICD-10-CM

## 2019-01-13 DIAGNOSIS — C9000 Multiple myeloma not having achieved remission: Secondary | ICD-10-CM | POA: Diagnosis not present

## 2019-01-13 MED ORDER — INFLUENZA VAC A&B SA ADJ QUAD 0.5 ML IM PRSY
0.5000 mL | PREFILLED_SYRINGE | Freq: Once | INTRAMUSCULAR | Status: AC
  Administered 2019-01-13: 11:00:00 0.5 mL via INTRAMUSCULAR

## 2019-01-13 MED ORDER — INFLUENZA VAC A&B SA ADJ QUAD 0.5 ML IM PRSY
PREFILLED_SYRINGE | INTRAMUSCULAR | Status: AC
  Filled 2019-01-13: qty 0.5

## 2019-01-13 NOTE — Progress Notes (Signed)
Carmen Fisher [phone]   Fax:([phone]) [place], MD [address] [place] [address]  DIAGNOSIS: Multiple myeloma, [place] diagnosed in August 2010  PRIOR THERAPY: 1) status post systemic chemotherapy with Velcade, Doxil and Decadron between 04/24/2009 through 07/27/2009 discontinued secondary to peripheral neuropathy. 2) status post treatment with Revlimid and Decadron between 09/01/2009 through December 2011. 3) status post peripheral blood autologous stem cell transplant on 04/26/2010 at [facility] under the care of Dr. Miki Kins. 4) maintenance chemotherapy with Revlimid 10 mg by mouth daily started June 2012. This will be changed to 5 mg by mouth daily starting next week secondary to persistent diarrhea.  CURRENT THERAPY: 1) Zometa 4 mg IV every 3 months.  INTERVAL HISTORY: Carmen Fisher 78 y.o. female returns to the clinic today for follow-up visit.  The patient is feeling fine today with no concerning complaints.  She denied having any chest pain, shortness of breath, cough or hemoptysis.  She had some mild pain behind the right ear improved with Tylenol.  She thinks that she slept in her bed position and having some arthritis pain in the neck area.  She denied having any current fever or chills.  She has no nausea, vomiting, diarrhea or constipation.  The patient had repeat myeloma panel performed recently and she is here for evaluation and discussion of her lab results.  MEDICAL HISTORY: Past Medical History:  Diagnosis Date  . Dyslipidemia   . GERD (gastroesophageal reflux disease)   . Gout    09-05-2017  per pt stable,  last flare-up long time ago  . H/O autologous stem cell transplant ([place]) 04-26-2010   @ [facility]  . History of acute pyelonephritis 08/16/2017  . History of chemotherapy 04-24-2009  to 07-27-2009   systemic  . History of sepsis    09/ 2014 secondary to UTI/   08-16-2017  SIRS, severe sepsis due to acute pyelonephritis with kidney stone obstruction  . Hypokalemia   . Intermittent diarrhea   . Multiple myeloma in remission [facility]) oncologist-  dr Julien Nordmann ([facility] ) and dr Miki Kins at [facility]   dx 08/ 2010-- [facility], completed chemo 07-27-2009 , s/p  high dose melphalan and autologous stem cell transplant 04-26-2010 @ [facility],  maintenance oral chemo (revlimid) started 06/ 2012--stopped 03/ 2019 due to neutropenia and diarrhea,  Zometa IV every 3 months  . Osteoarthritis   . Peripheral neuropathy due to chemotherapy ([place])   . Renal calculus, right   . Right ureteral calculus   . Wears dentures    upper  . Wears glasses     ALLERGIES:  is allergic to aspirin; ampicillin; sulfa antibiotics; and penicillins.  MEDICATIONS:  Current Outpatient Medications  Medication Sig Dispense Refill  . acetaminophen (TYLENOL) 500 MG tablet Take 500 mg by mouth every 6 (six) hours as needed.    Marland Kitchen allopurinol (ZYLOPRIM) 300 MG tablet TAKE 1 TABLET DAILY 90 tablet 0  . cholecalciferol (VITAMIN D) 1000 UNITS tablet Take 1,000 Units by mouth daily.    . dorzolamide-timolol (COSOPT) 22.3-6.8 MG/ML ophthalmic solution Place 1 drop into both eyes 2 (two) times daily.     Marland Kitchen gabapentin (NEURONTIN) 600 MG tablet Take 1 tablet by mouth twice daily 60 tablet 1  . HYDROcodone-acetaminophen (NORCO) 10-325 MG tablet Take 1-2 tablets by mouth every 4 (four) hours as needed for moderate pain. Maximum dose per 24 hours - 8 pills  14 tablet 0  . latanoprost (XALATAN) 0.005 % ophthalmic solution Place 1 drop into both eyes at bedtime.     Marland Kitchen loperamide (IMODIUM) 2 MG capsule TAKE 1 CAPSULE AS NEEDED   FOR DIARRHEA OR LOOSE STOOL(TAKE UP TO 6 CAPSULES     DAILY) 30 capsule 1  . LORazepam (ATIVAN) 1 MG tablet Take 1 mg by mouth every 4 (four) hours as needed (neuropathy). Reported on 09/14/2015    . Multiple Vitamin (MULTIVITAMIN WITH MINERALS) TABS tablet Take 1 tablet by  mouth daily.    . pantoprazole (PROTONIX) 40 MG tablet Take 1 tablet (40 mg total) by mouth daily. 90 tablet 0  . potassium chloride SA (KLOR-CON M20) 20 MEQ tablet TAKE 1 AND 1/2 TABLETS     DAILY.--- TAKE 1 TABLET IN THE MORNING AND 1/2 TABLET IN THE EVENING 135 tablet 0   No current facility-administered medications for this visit.     SURGICAL HISTORY:  Past Surgical History:  Procedure Laterality Date  . APPENDECTOMY  1990s  . CARPAL TUNNEL RELEASE Left 10-14-2001   [facility]   AND A-1 PULLEY RELEASE LEFT RING FINGER  . CYSTOSCOPY W/ URETERAL STENT PLACEMENT Right 08/16/2017   Procedure: CYSTOSCOPY WITH RETROGRADE PYELOGRAM/URETERAL STENT PLACEMENT;  Surgeon: Ardis Hughs, MD;  Location: [facility];  Service: Urology;  Laterality: Right;  . CYSTOSCOPY/RETROGRADE/URETEROSCOPY Right 09/08/2017   Procedure: CYSTOSCOPY/RETROGRADE/URETEROSCOPY/STONE EXTRACTION/ STENT EXCHANGE, STONE BASKET RETRIVAL, LASER;  Surgeon: Kathie Rhodes, MD;  Location: [facility];  Service: Urology;  Laterality: Right;  Marland Kitchen VAGINAL HYSTERECTOMY  1994   W/  BSO    REVIEW OF SYSTEMS:  A comprehensive review of systems was negative except for: Constitutional: positive for fatigue Musculoskeletal: positive for neck pain   PHYSICAL EXAMINATION: General appearance: alert, cooperative and no distress Head: Normocephalic, without obvious abnormality, atraumatic Neck: no adenopathy, no JVD, supple, symmetrical, trachea midline and thyroid not enlarged, symmetric, no tenderness/mass/nodules Lymph nodes: Cervical, supraclavicular, and axillary nodes normal. Resp: clear to auscultation bilaterally Back: symmetric, no curvature. ROM normal. No CVA tenderness. Cardio: regular rate and rhythm, S1, S2 normal, no murmur, click, rub or gallop GI: soft, non-tender; bowel sounds normal; no masses,  no organomegaly Extremities: extremities normal, atraumatic, no cyanosis or edema  ECOG PERFORMANCE STATUS: 1 -  Symptomatic but completely ambulatory  Blood pressure 135/73, pulse 73, temperature 97.8 F (36.6 C), temperature source Temporal, resp. rate 18, height _0  (1.651 m), weight 162 lb 11.2 oz (73.8 kg), SpO2 99 %.  LABORATORY DATA: Lab Results  Component Value Date   WBC 5.1 01/06/2019   HGB 11.7 (L) 01/06/2019   HCT 36.7 01/06/2019   MCV 100.0 01/06/2019   PLT 205 01/06/2019      Chemistry      Component Value Date/Time   NA 140 01/06/2019 1058   NA 141 03/06/2017 1024   K 4.0 01/06/2019 1058   K 3.9 03/06/2017 1024   CL 105 01/06/2019 1058   CL 106 07/28/2012 0924   CO2 28 01/06/2019 1058   CO2 25 03/06/2017 1024   BUN 16 01/06/2019 1058   BUN 14.3 03/06/2017 1024   CREATININE 0.87 01/06/2019 1058   CREATININE 1.0 03/06/2017 1024      Component Value Date/Time   CALCIUM 9.8 01/06/2019 1058   CALCIUM 9.8 03/06/2017 1024   ALKPHOS 56 01/06/2019 1058   ALKPHOS 74 03/06/2017 1024   AST 16 01/06/2019 1058   AST 16 03/06/2017 1024   ALT 12 01/06/2019  1058   ALT 15 03/06/2017 1024   BILITOT 0.5 01/06/2019 1058   BILITOT 0.57 03/06/2017 1024       RADIOGRAPHIC STUDIES: No results found.  ASSESSMENT AND PLAN:  This is a very pleasant 78 years old African-American female with multiple myeloma and has been on maintenance Revlimid currently 5 mg by mouth daily and has been on this treatment for almost 6 years now. Her treatment was discontinued secondary to neutropenia and persistent diarrhea.   The patient is currently on observation and she is feeling fine with no concerning complaints. She had repeat myeloma panel performed recently.  I discussed the lab results with the patient and there is no evidence for disease progression. I will see her back for follow-up visit in 6 months for evaluation with repeat myeloma panel. She will continue on Zometa infusion every 3 months. The patient will receive flu vaccine today. She was advised to call immediately if she has any  concerning symptoms in the interval. The patient voices understanding of current disease status and treatment options and is in agreement with the current care plan.  All questions were answered. The patient knows to call the clinic with any problems, questions or concerns. We can certainly see the patient much sooner if necessary.  Disclaimer: This note was dictated with voice recognition software. Similar sounding words can inadvertently be transcribed and may not be corrected upon review.

## 2019-01-14 ENCOUNTER — Telehealth: Payer: Self-pay | Admitting: Internal Medicine

## 2019-01-14 NOTE — Telephone Encounter (Signed)
Scheduled appt per 10/14 los - unable to reach pt . Mailed reminder letter of updated appts.

## 2019-01-18 ENCOUNTER — Other Ambulatory Visit: Payer: Self-pay

## 2019-01-18 DIAGNOSIS — Z20822 Contact with and (suspected) exposure to covid-19: Secondary | ICD-10-CM

## 2019-01-20 LAB — NOVEL CORONAVIRUS, NAA: SARS-CoV-2, NAA: NOT DETECTED

## 2019-01-23 ENCOUNTER — Other Ambulatory Visit: Payer: Self-pay | Admitting: Internal Medicine

## 2019-01-23 DIAGNOSIS — C9000 Multiple myeloma not having achieved remission: Secondary | ICD-10-CM

## 2019-02-11 DIAGNOSIS — R1310 Dysphagia, unspecified: Secondary | ICD-10-CM | POA: Diagnosis not present

## 2019-02-11 DIAGNOSIS — Z8719 Personal history of other diseases of the digestive system: Secondary | ICD-10-CM | POA: Diagnosis not present

## 2019-02-23 ENCOUNTER — Other Ambulatory Visit: Payer: Self-pay | Admitting: Internal Medicine

## 2019-02-23 DIAGNOSIS — C9000 Multiple myeloma not having achieved remission: Secondary | ICD-10-CM

## 2019-03-09 ENCOUNTER — Other Ambulatory Visit: Payer: Medicare Other

## 2019-03-16 ENCOUNTER — Ambulatory Visit: Payer: Medicare Other | Admitting: Internal Medicine

## 2019-03-16 ENCOUNTER — Inpatient Hospital Stay: Payer: Medicare Other

## 2019-03-16 ENCOUNTER — Inpatient Hospital Stay: Payer: Medicare Other | Attending: Internal Medicine

## 2019-03-16 ENCOUNTER — Other Ambulatory Visit: Payer: Self-pay

## 2019-03-16 VITALS — BP 156/84 | HR 66 | Temp 98.7°F | Resp 20

## 2019-03-16 DIAGNOSIS — C9000 Multiple myeloma not having achieved remission: Secondary | ICD-10-CM | POA: Diagnosis not present

## 2019-03-16 LAB — BASIC METABOLIC PANEL
Anion gap: 6 (ref 5–15)
BUN: 15 mg/dL (ref 8–23)
CO2: 29 mmol/L (ref 22–32)
Calcium: 9.4 mg/dL (ref 8.9–10.3)
Chloride: 106 mmol/L (ref 98–111)
Creatinine, Ser: 0.91 mg/dL (ref 0.44–1.00)
GFR calc Af Amer: >60 mL/min (ref >60)
GFR calc non Af Amer: >60 mL/min (ref >60)
Glucose, Bld: 81 mg/dL (ref 70–99)
Potassium: 4.4 mmol/L (ref 3.5–5.1)
Sodium: 141 mmol/L (ref 135–145)

## 2019-03-16 MED ORDER — ZOLEDRONIC ACID 4 MG/5ML IV CONC: 4.0000 mg | Freq: Once | INTRAVENOUS | Status: DC

## 2019-03-16 MED ORDER — SODIUM CHLORIDE 0.9 % IV SOLN
Freq: Once | INTRAVENOUS | Status: AC
  Filled 2019-03-16: qty 250

## 2019-03-16 MED ORDER — ZOLEDRONIC ACID 4 MG/100ML IV SOLN
4.0000 mg | Freq: Once | INTRAVENOUS | Status: AC
  Administered 2019-03-16: 4 mg via INTRAVENOUS
  Filled 2019-03-16: qty 100

## 2019-03-16 NOTE — Patient Instructions (Signed)
Zoledronic Acid injection (Hypercalcemia, Oncology) What is this medicine? ZOLEDRONIC ACID (ZOE le dron ik AS id) lowers the amount of calcium loss from bone. It is used to treat too much calcium in your blood from cancer. It is also used to prevent complications of cancer that has spread to the bone. This medicine may be used for other purposes; ask your health care provider or pharmacist if you have questions. COMMON BRAND NAME(S): Zometa What should I tell my health care provider before I take this medicine? They need to know if you have any of these conditions:  aspirin-sensitive asthma  cancer, especially if you are receiving medicines used to treat cancer  dental disease or wear dentures  infection  kidney disease  receiving corticosteroids like dexamethasone or prednisone  an unusual or allergic reaction to zoledronic acid, other medicines, foods, dyes, or preservatives  pregnant or trying to get pregnant  breast-feeding How should I use this medicine? This medicine is for infusion into a vein. It is given by a health care professional in a hospital or clinic setting. Talk to your pediatrician regarding the use of this medicine in children. Special care may be needed. Overdosage: If you think you have taken too much of this medicine contact a poison control center or emergency room at once. NOTE: This medicine is only for you. Do not share this medicine with others. What if I miss a dose? It is important not to miss your dose. Call your doctor or health care professional if you are unable to keep an appointment. What may interact with this medicine?  certain antibiotics given by injection  NSAIDs, medicines for pain and inflammation, like ibuprofen or naproxen  some diuretics like bumetanide, furosemide  teriparatide  thalidomide This list may not describe all possible interactions. Give your health care provider a list of all the medicines, herbs, non-prescription  drugs, or dietary supplements you use. Also tell them if you smoke, drink alcohol, or use illegal drugs. Some items may interact with your medicine. What should I watch for while using this medicine? Visit your doctor or health care professional for regular checkups. It may be some time before you see the benefit from this medicine. Do not stop taking your medicine unless your doctor tells you to. Your doctor may order blood tests or other tests to see how you are doing. Women should inform their doctor if they wish to become pregnant or think they might be pregnant. There is a potential for serious side effects to an unborn child. Talk to your health care professional or pharmacist for more information. You should make sure that you get enough calcium and vitamin D while you are taking this medicine. Discuss the foods you eat and the vitamins you take with your health care professional. Some people who take this medicine have severe bone, joint, and/or muscle pain. This medicine may also increase your risk for jaw problems or a broken thigh bone. Tell your doctor right away if you have severe pain in your jaw, bones, joints, or muscles. Tell your doctor if you have any pain that does not go away or that gets worse. Tell your dentist and dental surgeon that you are taking this medicine. You should not have major dental surgery while on this medicine. See your dentist to have a dental exam and fix any dental problems before starting this medicine. Take good care of your teeth while on this medicine. Make sure you see your dentist for regular follow-up   appointments. What side effects may I notice from receiving this medicine? Side effects that you should report to your doctor or health care professional as soon as possible:  allergic reactions like skin rash, itching or hives, swelling of the face, lips, or tongue  anxiety, confusion, or depression  breathing problems  changes in vision  eye  pain  feeling faint or lightheaded, falls  jaw pain, especially after dental work  mouth sores  muscle cramps, stiffness, or weakness  redness, blistering, peeling or loosening of the skin, including inside the mouth  trouble passing urine or change in the amount of urine Side effects that usually do not require medical attention (report to your doctor or health care professional if they continue or are bothersome):  bone, joint, or muscle pain  constipation  diarrhea  fever  hair loss  irritation at site where injected  loss of appetite  nausea, vomiting  stomach upset  trouble sleeping  trouble swallowing  weak or tired This list may not describe all possible side effects. Call your doctor for medical advice about side effects. You may report side effects to FDA at 1-800-FDA-1088. Where should I keep my medicine? This drug is given in a hospital or clinic and will not be stored at home. NOTE: This sheet is a summary. It may not cover all possible information. If you have questions about this medicine, talk to your doctor, pharmacist, or health care provider.  2020 Elsevier/Gold Standard (2013-08-14 14:19:39)  Coronavirus (COVID-19) Are you at risk?  Are you at risk for the Coronavirus (COVID-19)?  To be considered HIGH RISK for Coronavirus (COVID-19), you have to meet the following criteria:  . Traveled to [place], [place], [place], [place] or [place]; or in the [place] to [place], [place], [place], or [place]; and have fever, cough, and shortness of breath within the last 2 weeks of travel OR . Been in close contact with a person diagnosed with COVID-19 within the last 2 weeks and have fever, cough, and shortness of breath . IF YOU DO NOT MEET THESE CRITERIA, YOU ARE CONSIDERED LOW RISK FOR COVID-19.  What to do if you are HIGH RISK for COVID-19?  . If you are having a medical emergency, call 911. . Seek medical care right away. Before you go  to a doctor's office, urgent care or emergency department, call ahead and tell them about your recent travel, contact with someone diagnosed with COVID-19, and your symptoms. You should receive instructions from your physician's office regarding next steps of care.  . When you arrive at healthcare provider, tell the healthcare staff immediately you have returned from visiting [place], [place], [place], [place] or [place]; or traveled in the [place] to [place], [place], [place], or [place]; in the last two weeks or you have been in close contact with a person diagnosed with COVID-19 in the last 2 weeks.   . Tell the health care staff about your symptoms: fever, cough and shortness of breath. . After you have been seen by a medical provider, you will be either: o Tested for (COVID-19) and discharged home on quarantine except to seek medical care if symptoms worsen, and asked to  - Stay home and avoid contact with others until you get your results (4-5 days)  - Avoid travel on public transportation if possible (such as bus, train, or airplane) or o Sent to the Emergency Department by EMS for evaluation, COVID-19 testing, and possible admission depending   on your condition and test results.  What to do if you are LOW RISK for COVID-19?  Reduce your risk of any infection by using the same precautions used for avoiding the common cold or flu:  . Wash your hands often with soap and warm water for at least 20 seconds.  If soap and water are not readily available, use an alcohol-based hand sanitizer with at least 60% alcohol.  . If coughing or sneezing, cover your mouth and nose by coughing or sneezing into the elbow areas of your shirt or coat, into a tissue or into your sleeve (not your hands). . Avoid shaking hands with others and consider head nods or verbal greetings only. . Avoid touching your eyes, nose, or mouth with unwashed hands.  . Avoid close contact with people who are sick. . Avoid  places or events with large numbers of people in one location, like concerts or sporting events. . Carefully consider travel plans you have or are making. . If you are planning any travel outside or inside the [place], visit the CDC's Travelers' Health webpage for the latest health notices. . If you have some symptoms but not all symptoms, continue to monitor at home and seek medical attention if your symptoms worsen. . If you are having a medical emergency, call 911.   ADDITIONAL HEALTHCARE OPTIONS FOR PATIENTS  [facility] / e-Visit: [email or web address]         [facility]: [phone]  [facility]: [phone]                   [facility]: [phone]   

## 2019-03-28 ENCOUNTER — Other Ambulatory Visit: Payer: Self-pay | Admitting: Internal Medicine

## 2019-03-28 DIAGNOSIS — C9000 Multiple myeloma not having achieved remission: Secondary | ICD-10-CM

## 2019-04-26 DIAGNOSIS — E7800 Pure hypercholesterolemia, unspecified: Secondary | ICD-10-CM | POA: Diagnosis not present

## 2019-04-26 DIAGNOSIS — I10 Essential (primary) hypertension: Secondary | ICD-10-CM | POA: Diagnosis not present

## 2019-04-26 DIAGNOSIS — N390 Urinary tract infection, site not specified: Secondary | ICD-10-CM | POA: Diagnosis not present

## 2019-04-29 DIAGNOSIS — Z0000 Encounter for general adult medical examination without abnormal findings: Secondary | ICD-10-CM | POA: Diagnosis not present

## 2019-04-29 DIAGNOSIS — N200 Calculus of kidney: Secondary | ICD-10-CM | POA: Diagnosis not present

## 2019-05-01 ENCOUNTER — Other Ambulatory Visit: Payer: Self-pay | Admitting: Internal Medicine

## 2019-05-01 DIAGNOSIS — C9000 Multiple myeloma not having achieved remission: Secondary | ICD-10-CM

## 2019-05-11 DIAGNOSIS — I10 Essential (primary) hypertension: Secondary | ICD-10-CM | POA: Diagnosis not present

## 2019-05-22 ENCOUNTER — Ambulatory Visit: Payer: Medicare Other | Attending: Internal Medicine

## 2019-05-22 DIAGNOSIS — Z23 Encounter for immunization: Secondary | ICD-10-CM | POA: Insufficient documentation

## 2019-05-22 NOTE — Progress Notes (Signed)
   Covid-19 Vaccination Clinic  Name:  Carmen Fisher    MRN: [id number] DOB: May 03, 1940  05/22/2019  Ms. Carmen Fisher was observed post Covid-19 immunization for 15 minutes without incidence. She was provided with Vaccine Information Sheet and instruction to access the V-Safe system.   Ms. Carmen Fisher was instructed to call 911 with any severe reactions post vaccine: Marland Kitchen Difficulty breathing  . Swelling of your face and throat  . A fast heartbeat  . A bad rash all over your body  . Dizziness and weakness    Immunizations Administered    Name Date Dose VIS Date Route   Pfizer COVID-19 Vaccine 05/22/2019 10:14 AM 0.3 mL 03/12/2019 Intramuscular   Manufacturer: [place]   Lot: [id number]   [place]: [id number]

## 2019-06-01 ENCOUNTER — Other Ambulatory Visit: Payer: Self-pay | Admitting: Internal Medicine

## 2019-06-01 DIAGNOSIS — C9000 Multiple myeloma not having achieved remission: Secondary | ICD-10-CM

## 2019-06-02 DIAGNOSIS — E875 Hyperkalemia: Secondary | ICD-10-CM | POA: Diagnosis not present

## 2019-06-02 DIAGNOSIS — Z03818 Encounter for observation for suspected exposure to other biological agents ruled out: Secondary | ICD-10-CM | POA: Diagnosis not present

## 2019-06-09 DIAGNOSIS — L218 Other seborrheic dermatitis: Secondary | ICD-10-CM | POA: Diagnosis not present

## 2019-06-11 ENCOUNTER — Other Ambulatory Visit: Payer: Self-pay

## 2019-06-11 DIAGNOSIS — C9000 Multiple myeloma not having achieved remission: Secondary | ICD-10-CM

## 2019-06-14 ENCOUNTER — Inpatient Hospital Stay: Payer: Medicare Other

## 2019-06-14 ENCOUNTER — Inpatient Hospital Stay: Payer: Medicare Other | Attending: Internal Medicine

## 2019-06-14 ENCOUNTER — Other Ambulatory Visit: Payer: Self-pay

## 2019-06-14 VITALS — BP 153/77 | HR 60 | Temp 98.0°F | Resp 18

## 2019-06-14 DIAGNOSIS — C9000 Multiple myeloma not having achieved remission: Secondary | ICD-10-CM

## 2019-06-14 LAB — CBC WITH DIFFERENTIAL (CANCER CENTER ONLY)
Abs Immature Granulocytes: 0.02 10*3/uL (ref 0.00–0.07)
Basophils Absolute: 0 10*3/uL (ref 0.0–0.1)
Basophils Relative: 0 %
Eosinophils Absolute: 0 10*3/uL (ref 0.0–0.5)
Eosinophils Relative: 1 %
HCT: 36.8 % (ref 36.0–46.0)
Hemoglobin: 11.7 g/dL — ABNORMAL LOW (ref 12.0–15.0)
Immature Granulocytes: 0 %
Lymphocytes Relative: 33 %
Lymphs Abs: 1.9 10*3/uL (ref 0.7–4.0)
MCH: 31.5 pg (ref 26.0–34.0)
MCHC: 31.8 g/dL (ref 30.0–36.0)
MCV: 99.2 fL (ref 80.0–100.0)
Monocytes Absolute: 0.6 10*3/uL (ref 0.1–1.0)
Monocytes Relative: 9 %
Neutro Abs: 3.3 10*3/uL (ref 1.7–7.7)
Neutrophils Relative %: 57 %
Platelet Count: 214 10*3/uL (ref 150–400)
RBC: 3.71 MIL/uL — ABNORMAL LOW (ref 3.87–5.11)
RDW: 13.7 % (ref 11.5–15.5)
WBC Count: 5.8 10*3/uL (ref 4.0–10.5)
nRBC: 0.3 % — ABNORMAL HIGH (ref 0.0–0.2)

## 2019-06-14 LAB — CMP (CANCER CENTER ONLY)
ALT: 13 U/L (ref 0–44)
AST: 17 U/L (ref 15–41)
Albumin: 4 g/dL (ref 3.5–5.0)
Alkaline Phosphatase: 51 U/L (ref 38–126)
Anion gap: 7 (ref 5–15)
BUN: 12 mg/dL (ref 8–23)
CO2: 27 mmol/L (ref 22–32)
Calcium: 9.4 mg/dL (ref 8.9–10.3)
Chloride: 107 mmol/L (ref 98–111)
Creatinine: 0.81 mg/dL (ref 0.44–1.00)
GFR, Est AFR Am: >60 mL/min (ref >60)
GFR, Estimated: >60 mL/min (ref >60)
Glucose, Bld: 84 mg/dL (ref 70–99)
Potassium: 3.9 mmol/L (ref 3.5–5.1)
Sodium: 141 mmol/L (ref 135–145)
Total Bilirubin: 0.5 mg/dL (ref 0.3–1.2)
Total Protein: 7.5 g/dL (ref 6.5–8.1)

## 2019-06-14 LAB — LACTATE DEHYDROGENASE: LDH: 184 U/L (ref 98–192)

## 2019-06-14 MED ORDER — ZOLEDRONIC ACID 4 MG/5ML IV CONC: 4.0000 mg | Freq: Once | INTRAVENOUS | Status: DC

## 2019-06-14 MED ORDER — ZOLEDRONIC ACID 4 MG/100ML IV SOLN
INTRAVENOUS | Status: AC
  Filled 2019-06-14: qty 100

## 2019-06-14 MED ORDER — ZOLEDRONIC ACID 4 MG/100ML IV SOLN
4.0000 mg | Freq: Once | INTRAVENOUS | Status: AC
  Administered 2019-06-14: 4 mg via INTRAVENOUS

## 2019-06-14 MED ORDER — SODIUM CHLORIDE 0.9 % IV SOLN
INTRAVENOUS | Status: DC
  Filled 2019-06-14: qty 250

## 2019-06-14 NOTE — Patient Instructions (Signed)
[facility] Discharge Instructions for Patients Receiving Chemotherapy  Today you received the following chemotherapy agents Zometa  To help prevent nausea and vomiting after your treatment, we encourage you to take your nausea medication as directed  If you develop nausea and vomiting that is not controlled by your nausea medication, call the clinic.   BELOW ARE SYMPTOMS THAT SHOULD BE REPORTED IMMEDIATELY:  *FEVER GREATER THAN 100.5 F  *CHILLS WITH OR WITHOUT FEVER  NAUSEA AND VOMITING THAT IS NOT CONTROLLED WITH YOUR NAUSEA MEDICATION  *UNUSUAL SHORTNESS OF BREATH  *UNUSUAL BRUISING OR BLEEDING  TENDERNESS IN MOUTH AND THROAT WITH OR WITHOUT PRESENCE OF ULCERS  *URINARY PROBLEMS  *BOWEL PROBLEMS  UNUSUAL RASH Items with * indicate a potential emergency and should be followed up as soon as possible.  Feel free to call the clinic should you have any questions or concerns. The clinic phone number is [phone].  Please show the CHEMO ALERT CARD at check-in to the Emergency Department and triage nurse.   

## 2019-06-15 ENCOUNTER — Ambulatory Visit: Payer: Medicare Other | Attending: Internal Medicine

## 2019-06-15 DIAGNOSIS — Z23 Encounter for immunization: Secondary | ICD-10-CM

## 2019-06-15 NOTE — Progress Notes (Signed)
   Covid-19 Vaccination Clinic  Name:  YOSSELINE GANN    MRN: [id number] DOB: 02-17-1941  06/15/2019  Ms. Anselmo was observed post Covid-19 immunization for 15 minutes without incident. She was provided with Vaccine Information Sheet and instruction to access the V-Safe system.   Ms. Beliles was instructed to call 911 with any severe reactions post vaccine: Marland Kitchen Difficulty breathing  . Swelling of face and throat  . A fast heartbeat  . A bad rash all over body  . Dizziness and weakness   Immunizations Administered    Name Date Dose VIS Date Route   Pfizer COVID-19 Vaccine 06/15/2019 10:37 AM 0.3 mL 03/12/2019 Intramuscular   Manufacturer: [place]   Lot: [id number]   [place]: [id number]

## 2019-06-28 ENCOUNTER — Other Ambulatory Visit: Payer: Self-pay | Admitting: Internal Medicine

## 2019-06-28 DIAGNOSIS — C9000 Multiple myeloma not having achieved remission: Secondary | ICD-10-CM

## 2019-07-08 ENCOUNTER — Inpatient Hospital Stay: Payer: Medicare Other | Attending: Internal Medicine

## 2019-07-08 ENCOUNTER — Other Ambulatory Visit: Payer: Self-pay

## 2019-07-08 DIAGNOSIS — C9000 Multiple myeloma not having achieved remission: Secondary | ICD-10-CM | POA: Diagnosis not present

## 2019-07-08 DIAGNOSIS — M542 Cervicalgia: Secondary | ICD-10-CM | POA: Diagnosis not present

## 2019-07-08 LAB — CBC WITH DIFFERENTIAL (CANCER CENTER ONLY)
Abs Immature Granulocytes: 0.01 10*3/uL (ref 0.00–0.07)
Basophils Absolute: 0 10*3/uL (ref 0.0–0.1)
Basophils Relative: 1 %
Eosinophils Absolute: 0 10*3/uL (ref 0.0–0.5)
Eosinophils Relative: 1 %
HCT: 38 % (ref 36.0–46.0)
Hemoglobin: 11.7 g/dL — ABNORMAL LOW (ref 12.0–15.0)
Immature Granulocytes: 0 %
Lymphocytes Relative: 32 %
Lymphs Abs: 2.1 10*3/uL (ref 0.7–4.0)
MCH: 31 pg (ref 26.0–34.0)
MCHC: 30.8 g/dL (ref 30.0–36.0)
MCV: 100.5 fL — ABNORMAL HIGH (ref 80.0–100.0)
Monocytes Absolute: 0.4 10*3/uL (ref 0.1–1.0)
Monocytes Relative: 7 %
Neutro Abs: 3.9 10*3/uL (ref 1.7–7.7)
Neutrophils Relative %: 59 %
Platelet Count: 226 10*3/uL (ref 150–400)
RBC: 3.78 MIL/uL — ABNORMAL LOW (ref 3.87–5.11)
RDW: 14 % (ref 11.5–15.5)
WBC Count: 6.5 10*3/uL (ref 4.0–10.5)
nRBC: 0 % (ref 0.0–0.2)

## 2019-07-08 LAB — CMP (CANCER CENTER ONLY)
ALT: 15 U/L (ref 0–44)
AST: 17 U/L (ref 15–41)
Albumin: 4 g/dL (ref 3.5–5.0)
Alkaline Phosphatase: 58 U/L (ref 38–126)
Anion gap: 6 (ref 5–15)
BUN: 11 mg/dL (ref 8–23)
CO2: 28 mmol/L (ref 22–32)
Calcium: 9.2 mg/dL (ref 8.9–10.3)
Chloride: 108 mmol/L (ref 98–111)
Creatinine: 0.82 mg/dL (ref 0.44–1.00)
GFR, Est AFR Am: >60 mL/min (ref >60)
GFR, Estimated: >60 mL/min (ref >60)
Glucose, Bld: 93 mg/dL (ref 70–99)
Potassium: 3.7 mmol/L (ref 3.5–5.1)
Sodium: 142 mmol/L (ref 135–145)
Total Bilirubin: 0.5 mg/dL (ref 0.3–1.2)
Total Protein: 7.6 g/dL (ref 6.5–8.1)

## 2019-07-08 LAB — LACTATE DEHYDROGENASE: LDH: 202 U/L — ABNORMAL HIGH (ref 98–192)

## 2019-07-09 LAB — KAPPA/LAMBDA LIGHT CHAINS
Kappa free light chain: 30 mg/L — ABNORMAL HIGH (ref 3.3–19.4)
Kappa, lambda light chain ratio: 2.19 — ABNORMAL HIGH (ref 0.26–1.65)
Lambda free light chains: 13.7 mg/L (ref 5.7–26.3)

## 2019-07-09 LAB — IGG, IGA, IGM
IgA: 304 mg/dL (ref 64–422)
IgG (Immunoglobin G), Serum: 1347 mg/dL (ref 586–1602)
IgM (Immunoglobulin M), Srm: 31 mg/dL (ref 26–217)

## 2019-07-09 LAB — BETA 2 MICROGLOBULIN, SERUM: Beta-2 Microglobulin: 1.3 mg/L (ref 0.6–2.4)

## 2019-07-15 ENCOUNTER — Encounter: Payer: Self-pay | Admitting: Internal Medicine

## 2019-07-15 ENCOUNTER — Inpatient Hospital Stay (HOSPITAL_BASED_OUTPATIENT_CLINIC_OR_DEPARTMENT_OTHER): Payer: Medicare Other | Admitting: Internal Medicine

## 2019-07-15 ENCOUNTER — Other Ambulatory Visit: Payer: Self-pay

## 2019-07-15 VITALS — BP 149/80 | HR 72 | Temp 98.0°F | Resp 17 | Ht 65.0 in | Wt 160.2 lb

## 2019-07-15 DIAGNOSIS — C9000 Multiple myeloma not having achieved remission: Secondary | ICD-10-CM

## 2019-07-15 DIAGNOSIS — M542 Cervicalgia: Secondary | ICD-10-CM | POA: Diagnosis not present

## 2019-07-15 NOTE — Progress Notes (Signed)
[place] Telephone:(336) [phone]   Fax:(336) [place], MD [address] [name] [address]  DIAGNOSIS: Multiple myeloma, Light chain diagnosed in August 2010  PRIOR THERAPY: 1) status post systemic chemotherapy with Velcade, Doxil and Decadron between 04/24/2009 through 07/27/2009 discontinued secondary to peripheral neuropathy. 2) status post treatment with Revlimid and Decadron between 09/01/2009 through December 2011. 3) status post peripheral blood autologous stem cell transplant on 04/26/2010 at [facility] under the care of Dr. Miki Kins. 4) maintenance chemotherapy with Revlimid 10 mg by mouth daily started June 2012. This will be changed to 5 mg by mouth daily starting next week secondary to persistent diarrhea.  CURRENT THERAPY: 1) Zometa 4 mg IV every 3 months.  INTERVAL HISTORY: Carmen Fisher 79 y.o. female returns to the clinic today for follow-up visit.  The patient is feeling fine today with no concerning complaints.  The patient denied having any current nausea, vomiting, diarrhea or constipation.  She has no chest pain, shortness of breath, cough or hemoptysis.  She denied having any fever or chills.  She has no weight loss or night sweats.  She is currently on observation.  She is here today for evaluation with repeat myeloma panel.  MEDICAL HISTORY: Past Medical History:  Diagnosis Date  . Dyslipidemia   . GERD (gastroesophageal reflux disease)   . Gout    09-05-2017  per pt stable,  last flare-up long time ago  . H/O autologous stem cell transplant ([place]) 04-26-2010   @ [facility]  . History of acute pyelonephritis 08/16/2017  . History of chemotherapy 04-24-2009  to 07-27-2009   systemic  . History of sepsis    09/ 2014 secondary to UTI/  08-16-2017  SIRS, severe sepsis due to acute pyelonephritis with kidney stone obstruction  . Hypokalemia   . Intermittent diarrhea   . Multiple  myeloma in remission [facility]) oncologist-  dr Julien Nordmann ([facility] ) and dr Miki Kins at [facility]   dx 08/ 2010-- [place], completed chemo 07-27-2009 , s/p  high dose melphalan and autologous stem cell transplant 04-26-2010 @ [facility],  maintenance oral chemo (revlimid) started 06/ 2012--stopped 03/ 2019 due to neutropenia and diarrhea,  Zometa IV every 3 months  . Osteoarthritis   . Peripheral neuropathy due to chemotherapy ([place])   . Renal calculus, right   . Right ureteral calculus   . Wears dentures    upper  . Wears glasses     ALLERGIES:  is allergic to aspirin; ampicillin; sulfa antibiotics; and penicillins.  MEDICATIONS:  Current Outpatient Medications  Medication Sig Dispense Refill  . acetaminophen (TYLENOL) 500 MG tablet Take 500 mg by mouth every 6 (six) hours as needed.    Marland Kitchen allopurinol (ZYLOPRIM) 300 MG tablet TAKE 1 TABLET DAILY 90 tablet 0  . cholecalciferol (VITAMIN D) 1000 UNITS tablet Take 1,000 Units by mouth daily.    . dorzolamide-timolol (COSOPT) 22.3-6.8 MG/ML ophthalmic solution Place 1 drop into both eyes 2 (two) times daily.     Marland Kitchen gabapentin (NEURONTIN) 600 MG tablet Take 1 tablet by mouth twice daily 60 tablet 0  . HYDROcodone-acetaminophen (NORCO) 10-325 MG tablet Take 1-2 tablets by mouth every 4 (four) hours as needed for moderate pain. Maximum dose per 24 hours - 8 pills 14 tablet 0  . latanoprost (XALATAN) 0.005 % ophthalmic solution Place 1 drop into both eyes at bedtime.     Marland Kitchen loperamide (IMODIUM)  2 MG capsule TAKE 1 CAPSULE AS NEEDED   FOR DIARRHEA OR LOOSE STOOL(TAKE UP TO 6 CAPSULES     DAILY) 30 capsule 1  . LORazepam (ATIVAN) 1 MG tablet Take 1 mg by mouth every 4 (four) hours as needed (neuropathy). Reported on 09/14/2015    . Multiple Vitamin (MULTIVITAMIN WITH MINERALS) TABS tablet Take 1 tablet by mouth daily.    . pantoprazole (PROTONIX) 40 MG tablet Take 1 tablet (40 mg total) by mouth daily. (Patient taking differently: Take 20 mg by mouth  daily. Takes 1/2 tablet) 90 tablet 0  . potassium chloride SA (KLOR-CON M20) 20 MEQ tablet TAKE 1 AND 1/2 TABLETS     DAILY.--- TAKE 1 TABLET IN THE MORNING AND 1/2 TABLET IN THE EVENING 135 tablet 0   No current facility-administered medications for this visit.    SURGICAL HISTORY:  Past Surgical History:  Procedure Laterality Date  . APPENDECTOMY  1990s  . CARPAL TUNNEL RELEASE Left 10-14-2001   [facility]   AND A-1 PULLEY RELEASE LEFT RING FINGER  . CYSTOSCOPY W/ URETERAL STENT PLACEMENT Right 08/16/2017   Procedure: CYSTOSCOPY WITH RETROGRADE PYELOGRAM/URETERAL STENT PLACEMENT;  Surgeon: Ardis Hughs, MD;  Location: [place];  Service: Urology;  Laterality: Right;  . CYSTOSCOPY/RETROGRADE/URETEROSCOPY Right 09/08/2017   Procedure: CYSTOSCOPY/RETROGRADE/URETEROSCOPY/STONE EXTRACTION/ STENT EXCHANGE, STONE BASKET RETRIVAL, LASER;  Surgeon: Kathie Rhodes, MD;  Location: [facility];  Service: Urology;  Laterality: Right;  Marland Kitchen VAGINAL HYSTERECTOMY  1994   W/  BSO    REVIEW OF SYSTEMS:  A comprehensive review of systems was negative except for: Constitutional: positive for fatigue Musculoskeletal: positive for neck pain   PHYSICAL EXAMINATION: General appearance: alert, cooperative and no distress Head: Normocephalic, without obvious abnormality, atraumatic Neck: no adenopathy, no JVD, supple, symmetrical, trachea midline and thyroid not enlarged, symmetric, no tenderness/mass/nodules Lymph nodes: Cervical, supraclavicular, and axillary nodes normal. Resp: clear to auscultation bilaterally Back: symmetric, no curvature. ROM normal. No CVA tenderness. Cardio: regular rate and rhythm, S1, S2 normal, no murmur, click, rub or gallop GI: soft, non-tender; bowel sounds normal; no masses,  no organomegaly Extremities: extremities normal, atraumatic, no cyanosis or edema  ECOG PERFORMANCE STATUS: 1 - Symptomatic but completely ambulatory  Blood pressure (!) 149/80, pulse 72,  temperature 98 F (36.7 C), temperature source Oral, resp. rate 17, height 5' 5" (1.651 m), weight 160 lb 3.2 oz (72.7 kg), SpO2 100 %.  LABORATORY DATA: Lab Results  Component Value Date   WBC 6.5 07/08/2019   HGB 11.7 (L) 07/08/2019   HCT 38.0 07/08/2019   MCV 100.5 (H) 07/08/2019   PLT 226 07/08/2019      Chemistry      Component Value Date/Time   NA 142 07/08/2019 1046   NA 141 03/06/2017 1024   K 3.7 07/08/2019 1046   K 3.9 03/06/2017 1024   CL 108 07/08/2019 1046   CL 106 07/28/2012 0924   CO2 28 07/08/2019 1046   CO2 25 03/06/2017 1024   BUN 11 07/08/2019 1046   BUN 14.3 03/06/2017 1024   CREATININE 0.82 07/08/2019 1046   CREATININE 1.0 03/06/2017 1024      Component Value Date/Time   CALCIUM 9.2 07/08/2019 1046   CALCIUM 9.8 03/06/2017 1024   ALKPHOS 58 07/08/2019 1046   ALKPHOS 74 03/06/2017 1024   AST 17 07/08/2019 1046   AST 16 03/06/2017 1024   ALT 15 07/08/2019 1046   ALT 15 03/06/2017 1024   BILITOT 0.5 07/08/2019 1046  BILITOT 0.57 03/06/2017 1024       RADIOGRAPHIC STUDIES: No results found.  ASSESSMENT AND PLAN:  This is a very pleasant 79 years old African-American female with multiple myeloma and has been on maintenance Revlimid currently 5 mg by mouth daily and has been on this treatment for almost 6 years now. Her treatment was discontinued secondary to neutropenia and persistent diarrhea.   The patient is currently on observation and feeling fine with no concerning complaints except for intermittent right neck pain likely secondary to arthritis. She had repeat myeloma panel last week.  I discussed the lab results with the patient.  Her myeloma panel showed no concerning evidence for disease progression. I recommended for the patient to continue on observation with repeat myeloma panel in 6 months. She will continue her Zometa infusion every 3 months. The patient was advised to call immediately if she has any concerning symptoms in the  interval. The patient voices understanding of current disease status and treatment options and is in agreement with the current care plan.  All questions were answered. The patient knows to call the clinic with any problems, questions or concerns. We can certainly see the patient much sooner if necessary.  Disclaimer: This note was dictated with voice recognition software. Similar sounding words can inadvertently be transcribed and may not be corrected upon review.

## 2019-07-20 ENCOUNTER — Telehealth: Payer: Self-pay | Admitting: Internal Medicine

## 2019-07-20 DIAGNOSIS — T148XXA Other injury of unspecified body region, initial encounter: Secondary | ICD-10-CM | POA: Diagnosis not present

## 2019-07-20 NOTE — Telephone Encounter (Signed)
Scheduled per los. Called and left msg. Mailed printout  °

## 2019-08-25 DIAGNOSIS — H2513 Age-related nuclear cataract, bilateral: Secondary | ICD-10-CM | POA: Diagnosis not present

## 2019-08-25 DIAGNOSIS — H40011 Open angle with borderline findings, low risk, right eye: Secondary | ICD-10-CM | POA: Diagnosis not present

## 2019-08-25 DIAGNOSIS — H25043 Posterior subcapsular polar age-related cataract, bilateral: Secondary | ICD-10-CM | POA: Diagnosis not present

## 2019-08-25 DIAGNOSIS — H524 Presbyopia: Secondary | ICD-10-CM | POA: Diagnosis not present

## 2019-09-13 ENCOUNTER — Other Ambulatory Visit: Payer: Self-pay | Admitting: Medical Oncology

## 2019-09-13 DIAGNOSIS — C9000 Multiple myeloma not having achieved remission: Secondary | ICD-10-CM

## 2019-09-14 ENCOUNTER — Other Ambulatory Visit: Payer: Self-pay

## 2019-09-14 ENCOUNTER — Inpatient Hospital Stay: Payer: Medicare Other | Attending: Internal Medicine

## 2019-09-14 ENCOUNTER — Inpatient Hospital Stay: Payer: Medicare Other

## 2019-09-14 VITALS — BP 154/75 | HR 69 | Temp 98.6°F | Resp 18

## 2019-09-14 DIAGNOSIS — C9000 Multiple myeloma not having achieved remission: Secondary | ICD-10-CM | POA: Diagnosis not present

## 2019-09-14 LAB — CBC WITH DIFFERENTIAL (CANCER CENTER ONLY)
Abs Immature Granulocytes: 0.02 10*3/uL (ref 0.00–0.07)
Basophils Absolute: 0 10*3/uL (ref 0.0–0.1)
Basophils Relative: 0 %
Eosinophils Absolute: 0 10*3/uL (ref 0.0–0.5)
Eosinophils Relative: 0 %
HCT: 35.1 % — ABNORMAL LOW (ref 36.0–46.0)
Hemoglobin: 11.4 g/dL — ABNORMAL LOW (ref 12.0–15.0)
Immature Granulocytes: 0 %
Lymphocytes Relative: 32 %
Lymphs Abs: 2.2 10*3/uL (ref 0.7–4.0)
MCH: 32.4 pg (ref 26.0–34.0)
MCHC: 32.5 g/dL (ref 30.0–36.0)
MCV: 99.7 fL (ref 80.0–100.0)
Monocytes Absolute: 0.6 10*3/uL (ref 0.1–1.0)
Monocytes Relative: 8 %
Neutro Abs: 4.1 10*3/uL (ref 1.7–7.7)
Neutrophils Relative %: 60 %
Platelet Count: 211 10*3/uL (ref 150–400)
RBC: 3.52 MIL/uL — ABNORMAL LOW (ref 3.87–5.11)
RDW: 13.9 % (ref 11.5–15.5)
WBC Count: 7 10*3/uL (ref 4.0–10.5)
nRBC: 0 % (ref 0.0–0.2)

## 2019-09-14 LAB — CMP (CANCER CENTER ONLY)
ALT: 12 U/L (ref 0–44)
AST: 15 U/L (ref 15–41)
Albumin: 3.9 g/dL (ref 3.5–5.0)
Alkaline Phosphatase: 56 U/L (ref 38–126)
Anion gap: 10 (ref 5–15)
BUN: 12 mg/dL (ref 8–23)
CO2: 26 mmol/L (ref 22–32)
Calcium: 9.4 mg/dL (ref 8.9–10.3)
Chloride: 107 mmol/L (ref 98–111)
Creatinine: 0.86 mg/dL (ref 0.44–1.00)
GFR, Est AFR Am: >60 mL/min (ref >60)
GFR, Estimated: >60 mL/min (ref >60)
Glucose, Bld: 87 mg/dL (ref 70–99)
Potassium: 3.6 mmol/L (ref 3.5–5.1)
Sodium: 143 mmol/L (ref 135–145)
Total Bilirubin: 0.3 mg/dL (ref 0.3–1.2)
Total Protein: 7.3 g/dL (ref 6.5–8.1)

## 2019-09-14 MED ORDER — ZOLEDRONIC ACID 4 MG/100ML IV SOLN: 4.0000 mg | Freq: Once | INTRAVENOUS | Status: DC

## 2019-09-14 MED ORDER — SODIUM CHLORIDE 0.9 % IV SOLN
INTRAVENOUS | Status: DC | PRN
  Administered 2019-09-14: 250 mL via INTRAVENOUS
  Filled 2019-09-14: qty 250

## 2019-09-14 MED ORDER — ZOLEDRONIC ACID 4 MG/100ML IV SOLN
INTRAVENOUS | Status: AC
  Filled 2019-09-14: qty 100

## 2019-09-14 MED ORDER — ZOLEDRONIC ACID 4 MG/5ML IV CONC
4.0000 mg | Freq: Once | INTRAVENOUS | Status: DC
  Administered 2019-09-14: 4 mg via INTRAVENOUS
  Filled 2019-09-14: qty 5

## 2019-09-14 NOTE — Patient Instructions (Signed)
Zoledronic Acid injection (Hypercalcemia, Oncology) What is this medicine? ZOLEDRONIC ACID (ZOE le dron ik AS id) lowers the amount of calcium loss from bone. It is used to treat too much calcium in your blood from cancer. It is also used to prevent complications of cancer that has spread to the bone. This medicine may be used for other purposes; ask your health care provider or pharmacist if you have questions. COMMON BRAND NAME(S): Zometa What should I tell my health care provider before I take this medicine? They need to know if you have any of these conditions:  aspirin-sensitive asthma  cancer, especially if you are receiving medicines used to treat cancer  dental disease or wear dentures  infection  kidney disease  receiving corticosteroids like dexamethasone or prednisone  an unusual or allergic reaction to zoledronic acid, other medicines, foods, dyes, or preservatives  pregnant or trying to get pregnant  breast-feeding How should I use this medicine? This medicine is for infusion into a vein. It is given by a health care professional in a hospital or clinic setting. Talk to your pediatrician regarding the use of this medicine in children. Special care may be needed. Overdosage: If you think you have taken too much of this medicine contact a poison control center or emergency room at once. NOTE: This medicine is only for you. Do not share this medicine with others. What if I miss a dose? It is important not to miss your dose. Call your doctor or health care professional if you are unable to keep an appointment. What may interact with this medicine?  certain antibiotics given by injection  NSAIDs, medicines for pain and inflammation, like ibuprofen or naproxen  some diuretics like bumetanide, furosemide  teriparatide  thalidomide This list may not describe all possible interactions. Give your health care provider a list of all the medicines, herbs, non-prescription  drugs, or dietary supplements you use. Also tell them if you smoke, drink alcohol, or use illegal drugs. Some items may interact with your medicine. What should I watch for while using this medicine? Visit your doctor or health care professional for regular checkups. It may be some time before you see the benefit from this medicine. Do not stop taking your medicine unless your doctor tells you to. Your doctor may order blood tests or other tests to see how you are doing. Women should inform their doctor if they wish to become pregnant or think they might be pregnant. There is a potential for serious side effects to an unborn child. Talk to your health care professional or pharmacist for more information. You should make sure that you get enough calcium and vitamin D while you are taking this medicine. Discuss the foods you eat and the vitamins you take with your health care professional. Some people who take this medicine have severe bone, joint, and/or muscle pain. This medicine may also increase your risk for jaw problems or a broken thigh bone. Tell your doctor right away if you have severe pain in your jaw, bones, joints, or muscles. Tell your doctor if you have any pain that does not go away or that gets worse. Tell your dentist and dental surgeon that you are taking this medicine. You should not have major dental surgery while on this medicine. See your dentist to have a dental exam and fix any dental problems before starting this medicine. Take good care of your teeth while on this medicine. Make sure you see your dentist for regular follow-up   appointments. What side effects may I notice from receiving this medicine? Side effects that you should report to your doctor or health care professional as soon as possible:  allergic reactions like skin rash, itching or hives, swelling of the face, lips, or tongue  anxiety, confusion, or depression  breathing problems  changes in vision  eye  pain  feeling faint or lightheaded, falls  jaw pain, especially after dental work  mouth sores  muscle cramps, stiffness, or weakness  redness, blistering, peeling or loosening of the skin, including inside the mouth  trouble passing urine or change in the amount of urine Side effects that usually do not require medical attention (report to your doctor or health care professional if they continue or are bothersome):  bone, joint, or muscle pain  constipation  diarrhea  fever  hair loss  irritation at site where injected  loss of appetite  nausea, vomiting  stomach upset  trouble sleeping  trouble swallowing  weak or tired This list may not describe all possible side effects. Call your doctor for medical advice about side effects. You may report side effects to FDA at 1-800-FDA-1088. Where should I keep my medicine? This drug is given in a hospital or clinic and will not be stored at home. NOTE: This sheet is a summary. It may not cover all possible information. If you have questions about this medicine, talk to your doctor, pharmacist, or health care provider.  2020 Elsevier/Gold Standard (2013-08-14 14:19:39)  

## 2019-10-01 DIAGNOSIS — M79605 Pain in left leg: Secondary | ICD-10-CM | POA: Diagnosis not present

## 2019-10-01 DIAGNOSIS — E7800 Pure hypercholesterolemia, unspecified: Secondary | ICD-10-CM | POA: Diagnosis not present

## 2019-10-01 DIAGNOSIS — Z131 Encounter for screening for diabetes mellitus: Secondary | ICD-10-CM | POA: Diagnosis not present

## 2019-10-01 DIAGNOSIS — Z1159 Encounter for screening for other viral diseases: Secondary | ICD-10-CM | POA: Diagnosis not present

## 2019-10-01 DIAGNOSIS — Z0000 Encounter for general adult medical examination without abnormal findings: Secondary | ICD-10-CM | POA: Diagnosis not present

## 2019-10-01 DIAGNOSIS — Z79899 Other long term (current) drug therapy: Secondary | ICD-10-CM | POA: Diagnosis not present

## 2019-10-01 DIAGNOSIS — R5383 Other fatigue: Secondary | ICD-10-CM | POA: Diagnosis not present

## 2019-10-01 DIAGNOSIS — R0989 Other specified symptoms and signs involving the circulatory and respiratory systems: Secondary | ICD-10-CM | POA: Diagnosis not present

## 2019-10-01 DIAGNOSIS — M129 Arthropathy, unspecified: Secondary | ICD-10-CM | POA: Diagnosis not present

## 2019-10-01 DIAGNOSIS — M79604 Pain in right leg: Secondary | ICD-10-CM | POA: Diagnosis not present

## 2019-10-01 DIAGNOSIS — E559 Vitamin D deficiency, unspecified: Secondary | ICD-10-CM | POA: Diagnosis not present

## 2019-10-08 DIAGNOSIS — H401132 Primary open-angle glaucoma, bilateral, moderate stage: Secondary | ICD-10-CM | POA: Diagnosis not present

## 2019-11-01 DIAGNOSIS — E559 Vitamin D deficiency, unspecified: Secondary | ICD-10-CM | POA: Diagnosis not present

## 2019-11-01 DIAGNOSIS — E7800 Pure hypercholesterolemia, unspecified: Secondary | ICD-10-CM | POA: Diagnosis not present

## 2019-11-01 DIAGNOSIS — D539 Nutritional anemia, unspecified: Secondary | ICD-10-CM | POA: Diagnosis not present

## 2019-11-03 DIAGNOSIS — H401112 Primary open-angle glaucoma, right eye, moderate stage: Secondary | ICD-10-CM | POA: Diagnosis not present

## 2019-12-02 DIAGNOSIS — E7800 Pure hypercholesterolemia, unspecified: Secondary | ICD-10-CM | POA: Diagnosis not present

## 2019-12-02 DIAGNOSIS — K591 Functional diarrhea: Secondary | ICD-10-CM | POA: Diagnosis not present

## 2019-12-02 DIAGNOSIS — D649 Anemia, unspecified: Secondary | ICD-10-CM | POA: Diagnosis not present

## 2019-12-07 ENCOUNTER — Other Ambulatory Visit: Payer: Self-pay | Admitting: Internal Medicine

## 2019-12-07 DIAGNOSIS — Z1231 Encounter for screening mammogram for malignant neoplasm of breast: Secondary | ICD-10-CM

## 2019-12-30 ENCOUNTER — Other Ambulatory Visit: Payer: Self-pay

## 2019-12-30 ENCOUNTER — Ambulatory Visit
Admission: RE | Admit: 2019-12-30 | Discharge: 2019-12-30 | Disposition: A | Discharge status: Home / Self Care | Payer: Medicare Other | Source: Ambulatory Visit | Attending: Internal Medicine | Admitting: Internal Medicine

## 2019-12-30 DIAGNOSIS — Z1231 Encounter for screening mammogram for malignant neoplasm of breast: Secondary | ICD-10-CM

## 2020-01-01 DIAGNOSIS — Z20822 Contact with and (suspected) exposure to covid-19: Secondary | ICD-10-CM | POA: Diagnosis not present

## 2020-01-06 ENCOUNTER — Other Ambulatory Visit: Payer: Self-pay

## 2020-01-06 ENCOUNTER — Inpatient Hospital Stay: Payer: Medicare Other | Attending: Internal Medicine

## 2020-01-06 DIAGNOSIS — C9000 Multiple myeloma not having achieved remission: Secondary | ICD-10-CM | POA: Diagnosis not present

## 2020-01-06 LAB — CMP (CANCER CENTER ONLY)
ALT: 15 U/L (ref 0–44)
AST: 19 U/L (ref 15–41)
Albumin: 4.1 g/dL (ref 3.5–5.0)
Alkaline Phosphatase: 57 U/L (ref 38–126)
Anion gap: 4 — ABNORMAL LOW (ref 5–15)
BUN: 17 mg/dL (ref 8–23)
CO2: 32 mmol/L (ref 22–32)
Calcium: 9.8 mg/dL (ref 8.9–10.3)
Chloride: 106 mmol/L (ref 98–111)
Creatinine: 0.94 mg/dL (ref 0.44–1.00)
GFR, Estimated: 59 mL/min — ABNORMAL LOW (ref >60)
Glucose, Bld: 87 mg/dL (ref 70–99)
Potassium: 3.7 mmol/L (ref 3.5–5.1)
Sodium: 142 mmol/L (ref 135–145)
Total Bilirubin: 0.5 mg/dL (ref 0.3–1.2)
Total Protein: 7.9 g/dL (ref 6.5–8.1)

## 2020-01-06 LAB — CBC WITH DIFFERENTIAL (CANCER CENTER ONLY)
Abs Immature Granulocytes: 0.02 10*3/uL (ref 0.00–0.07)
Basophils Absolute: 0 10*3/uL (ref 0.0–0.1)
Basophils Relative: 1 %
Eosinophils Absolute: 0.1 10*3/uL (ref 0.0–0.5)
Eosinophils Relative: 1 %
HCT: 36.3 % (ref 36.0–46.0)
Hemoglobin: 11.8 g/dL — ABNORMAL LOW (ref 12.0–15.0)
Immature Granulocytes: 0 %
Lymphocytes Relative: 35 %
Lymphs Abs: 2.5 10*3/uL (ref 0.7–4.0)
MCH: 31.6 pg (ref 26.0–34.0)
MCHC: 32.5 g/dL (ref 30.0–36.0)
MCV: 97.1 fL (ref 80.0–100.0)
Monocytes Absolute: 0.5 10*3/uL (ref 0.1–1.0)
Monocytes Relative: 8 %
Neutro Abs: 4 10*3/uL (ref 1.7–7.7)
Neutrophils Relative %: 55 %
Platelet Count: 218 10*3/uL (ref 150–400)
RBC: 3.74 MIL/uL — ABNORMAL LOW (ref 3.87–5.11)
RDW: 14 % (ref 11.5–15.5)
WBC Count: 7.1 10*3/uL (ref 4.0–10.5)
nRBC: 0.3 % — ABNORMAL HIGH (ref 0.0–0.2)

## 2020-01-06 LAB — LACTATE DEHYDROGENASE: LDH: 201 U/L — ABNORMAL HIGH (ref 98–192)

## 2020-01-08 LAB — BETA 2 MICROGLOBULIN, SERUM: Beta-2 Microglobulin: 1.7 mg/L (ref 0.6–2.4)

## 2020-01-09 LAB — IGG, IGA, IGM
IgA: 353 mg/dL (ref 64–422)
IgG (Immunoglobin G), Serum: 1462 mg/dL (ref 586–1602)
IgM (Immunoglobulin M), Srm: 42 mg/dL (ref 26–217)

## 2020-01-10 LAB — KAPPA/LAMBDA LIGHT CHAINS
Kappa free light chain: 42.3 mg/L — ABNORMAL HIGH (ref 3.3–19.4)
Kappa, lambda light chain ratio: 3.02 — ABNORMAL HIGH (ref 0.26–1.65)
Lambda free light chains: 14 mg/L (ref 5.7–26.3)

## 2020-01-13 ENCOUNTER — Other Ambulatory Visit: Payer: Self-pay

## 2020-01-13 ENCOUNTER — Inpatient Hospital Stay: Payer: Medicare Other

## 2020-01-13 ENCOUNTER — Inpatient Hospital Stay: Payer: Medicare Other | Admitting: Internal Medicine

## 2020-01-13 ENCOUNTER — Encounter: Payer: Self-pay | Admitting: Internal Medicine

## 2020-01-13 VITALS — BP 138/76 | HR 73 | Temp 96.3°F | Resp 17 | Ht 65.0 in | Wt 159.4 lb

## 2020-01-13 DIAGNOSIS — C9000 Multiple myeloma not having achieved remission: Secondary | ICD-10-CM

## 2020-01-13 MED ORDER — ZOLEDRONIC ACID 4 MG/100ML IV SOLN
INTRAVENOUS | Status: AC
  Filled 2020-01-13: qty 100

## 2020-01-13 MED ORDER — ZOLEDRONIC ACID 4 MG/100ML IV SOLN
4.0000 mg | Freq: Once | INTRAVENOUS | Status: AC
  Administered 2020-01-13: 4 mg via INTRAVENOUS

## 2020-01-13 MED ORDER — ZOLEDRONIC ACID 4 MG/5ML IV CONC: 4.0000 mg | Freq: Once | INTRAVENOUS | Status: DC

## 2020-01-13 MED ORDER — SODIUM CHLORIDE 0.9 % IV SOLN
INTRAVENOUS | Status: DC
  Filled 2020-01-13: qty 250

## 2020-01-13 NOTE — Progress Notes (Signed)
[place] Telephone:(336) [phone]   Fax:(336) [place], MD [address] [place] [address]  DIAGNOSIS: Multiple myeloma, Light chain diagnosed in August 2010  PRIOR THERAPY: 1) status post systemic chemotherapy with Velcade, Doxil and Decadron between 04/24/2009 through 07/27/2009 discontinued secondary to peripheral neuropathy. 2) status post treatment with Revlimid and Decadron between 09/01/2009 through December 2011. 3) status post peripheral blood autologous stem cell transplant on 04/26/2010 at [facility] under the care of Dr. Miki Kins. 4) maintenance chemotherapy with Revlimid 10 mg by mouth daily started June 2012. This will be changed to 5 mg by mouth daily starting next week secondary to persistent diarrhea.  CURRENT THERAPY: 1) Zometa 4 mg IV every 3 months.  INTERVAL HISTORY: Carmen Fisher 79 y.o. female returns to the clinic today for follow-up visit.  The patient is feeling fine today with no concerning complaints except for occasional right upper quadrant pain.  She denied having any nausea, vomiting, diarrhea or constipation.  She denied having any chest pain, shortness of breath, cough or hemoptysis.  She has no fever or chills.  She has no significant weight loss or night sweats.  She is here today for evaluation after repeating myeloma panel.  MEDICAL HISTORY: Past Medical History:  Diagnosis Date  . Dyslipidemia   . GERD (gastroesophageal reflux disease)   . Gout    09-05-2017  per pt stable,  last flare-up long time ago  . H/O autologous stem cell transplant ([place]) 04-26-2010   @ [facility]  . History of acute pyelonephritis 08/16/2017  . History of chemotherapy 04-24-2009  to 07-27-2009   systemic  . History of sepsis    09/ 2014 secondary to UTI/  08-16-2017  SIRS, severe sepsis due to acute pyelonephritis with kidney stone obstruction  . Hypokalemia   . Intermittent diarrhea    . Multiple myeloma in remission [facility]) oncologist-  dr Julien Nordmann ([facility] ) and dr Miki Kins at [facility]   dx 08/ 2010-- [place], completed chemo 07-27-2009 , s/p  high dose melphalan and autologous stem cell transplant 04-26-2010 @ [facility],  maintenance oral chemo (revlimid) started 06/ 2012--stopped 03/ 2019 due to neutropenia and diarrhea,  Zometa IV every 3 months  . Osteoarthritis   . Peripheral neuropathy due to chemotherapy ([place])   . Renal calculus, right   . Right ureteral calculus   . Wears dentures    upper  . Wears glasses     ALLERGIES:  is allergic to aspirin, ampicillin, sulfa antibiotics, and penicillins.  MEDICATIONS:  Current Outpatient Medications  Medication Sig Dispense Refill  . acetaminophen (TYLENOL) 500 MG tablet Take 500 mg by mouth every 6 (six) hours as needed.    Marland Kitchen allopurinol (ZYLOPRIM) 300 MG tablet TAKE 1 TABLET DAILY 90 tablet 0  . cholecalciferol (VITAMIN D) 1000 UNITS tablet Take 1,000 Units by mouth daily.    . dorzolamide-timolol (COSOPT) 22.3-6.8 MG/ML ophthalmic solution Place 1 drop into both eyes 2 (two) times daily.     Marland Kitchen gabapentin (NEURONTIN) 600 MG tablet Take 1 tablet by mouth twice daily 60 tablet 0  . HYDROcodone-acetaminophen (NORCO) 10-325 MG tablet Take 1-2 tablets by mouth every 4 (four) hours as needed for moderate pain. Maximum dose per 24 hours - 8 pills 14 tablet 0  . latanoprost (XALATAN) 0.005 % ophthalmic solution Place 1 drop into both eyes at bedtime.     Marland Kitchen loperamide (IMODIUM)  2 MG capsule TAKE 1 CAPSULE AS NEEDED   FOR DIARRHEA OR LOOSE STOOL(TAKE UP TO 6 CAPSULES     DAILY) 30 capsule 1  . LORazepam (ATIVAN) 1 MG tablet Take 1 mg by mouth every 4 (four) hours as needed (neuropathy). Reported on 09/14/2015    . Multiple Vitamin (MULTIVITAMIN WITH MINERALS) TABS tablet Take 1 tablet by mouth daily.    . pantoprazole (PROTONIX) 40 MG tablet Take 1 tablet (40 mg total) by mouth daily. (Patient taking differently: Take 20  mg by mouth daily. Takes 1/2 tablet) 90 tablet 0  . potassium chloride SA (KLOR-CON M20) 20 MEQ tablet TAKE 1 AND 1/2 TABLETS     DAILY.--- TAKE 1 TABLET IN THE MORNING AND 1/2 TABLET IN THE EVENING 135 tablet 0   No current facility-administered medications for this visit.    SURGICAL HISTORY:  Past Surgical History:  Procedure Laterality Date  . APPENDECTOMY  1990s  . CARPAL TUNNEL RELEASE Left 10-14-2001   [facility]   AND A-1 PULLEY RELEASE LEFT RING FINGER  . CYSTOSCOPY W/ URETERAL STENT PLACEMENT Right 08/16/2017   Procedure: CYSTOSCOPY WITH RETROGRADE PYELOGRAM/URETERAL STENT PLACEMENT;  Surgeon: Ardis Hughs, MD;  Location: [place];  Service: Urology;  Laterality: Right;  . CYSTOSCOPY/RETROGRADE/URETEROSCOPY Right 09/08/2017   Procedure: CYSTOSCOPY/RETROGRADE/URETEROSCOPY/STONE EXTRACTION/ STENT EXCHANGE, STONE BASKET RETRIVAL, LASER;  Surgeon: Kathie Rhodes, MD;  Location: [facility];  Service: Urology;  Laterality: Right;  Marland Kitchen VAGINAL HYSTERECTOMY  1994   W/  BSO    REVIEW OF SYSTEMS:  A comprehensive review of systems was negative except for: Gastrointestinal: positive for abdominal pain   PHYSICAL EXAMINATION: General appearance: alert, cooperative and no distress Head: Normocephalic, without obvious abnormality, atraumatic Neck: no adenopathy, no JVD, supple, symmetrical, trachea midline and thyroid not enlarged, symmetric, no tenderness/mass/nodules Lymph nodes: Cervical, supraclavicular, and axillary nodes normal. Resp: clear to auscultation bilaterally Back: symmetric, no curvature. ROM normal. No CVA tenderness. Cardio: regular rate and rhythm, S1, S2 normal, no murmur, click, rub or gallop GI: soft, non-tender; bowel sounds normal; no masses,  no organomegaly Extremities: extremities normal, atraumatic, no cyanosis or edema  ECOG PERFORMANCE STATUS: 1 - Symptomatic but completely ambulatory  Blood pressure 138/76, pulse 73, temperature (!) 96.3 F (35.7  C), temperature source Tympanic, resp. rate 17, height $RemoveBe'5\' 5"'MtEiBuYUg$  (1.651 m), weight 159 lb 6.4 oz (72.3 kg), SpO2 100 %.  LABORATORY DATA: Lab Results  Component Value Date   WBC 7.1 01/06/2020   HGB 11.8 (L) 01/06/2020   HCT 36.3 01/06/2020   MCV 97.1 01/06/2020   PLT 218 01/06/2020      Chemistry      Component Value Date/Time   NA 142 01/06/2020 1425   NA 141 03/06/2017 1024   K 3.7 01/06/2020 1425   K 3.9 03/06/2017 1024   CL 106 01/06/2020 1425   CL 106 07/28/2012 0924   CO2 32 01/06/2020 1425   CO2 25 03/06/2017 1024   BUN 17 01/06/2020 1425   BUN 14.3 03/06/2017 1024   CREATININE 0.94 01/06/2020 1425   CREATININE 1.0 03/06/2017 1024      Component Value Date/Time   CALCIUM 9.8 01/06/2020 1425   CALCIUM 9.8 03/06/2017 1024   ALKPHOS 57 01/06/2020 1425   ALKPHOS 74 03/06/2017 1024   AST 19 01/06/2020 1425   AST 16 03/06/2017 1024   ALT 15 01/06/2020 1425   ALT 15 03/06/2017 1024   BILITOT 0.5 01/06/2020 1425   BILITOT 0.57 03/06/2017  1024       RADIOGRAPHIC STUDIES: MM 3D SCREEN BREAST BILATERAL  Result Date: 01/02/2020 CLINICAL DATA:  Screening. EXAM: DIGITAL SCREENING BILATERAL MAMMOGRAM WITH TOMO AND CAD COMPARISON:  Previous exam(s). ACR Breast Density Category b: There are scattered areas of fibroglandular density. FINDINGS: There are no findings suspicious for malignancy. Images were processed with CAD. IMPRESSION: No mammographic evidence of malignancy. A result letter of this screening mammogram will be mailed directly to the patient. RECOMMENDATION: Screening mammogram in one year. (Code:SM-B-01Y) BI-RADS CATEGORY  1: Negative. Electronically Signed   By: Valentino Saxon MD   On: 01/02/2020 14:43    ASSESSMENT AND PLAN:  This is a very pleasant 79 years old African-American female with multiple myeloma and has been on maintenance Revlimid currently 5 mg by mouth daily and has been on this treatment for almost 6 years now. Her treatment was discontinued  secondary to neutropenia and persistent diarrhea.   She is currently on observation and she is feeling fine today with no concerning complaints. She had repeat myeloma panel that showed no concerning findings for progression except for slightly further increase in the free kappa light chain. I recommended for the patient to continue on observation for now with repeat myeloma panel in 6 months. For the bone disease, she will continue her current treatment with Zometa every 3 months. The patient was advised to call immediately if she has any concerning symptoms in the interval. The patient voices understanding of current disease status and treatment options and is in agreement with the current care plan.  All questions were answered. The patient knows to call the clinic with any problems, questions or concerns. We can certainly see the patient much sooner if necessary.  Disclaimer: This note was dictated with voice recognition software. Similar sounding words can inadvertently be transcribed and may not be corrected upon review.

## 2020-01-13 NOTE — Patient Instructions (Signed)
Zoledronic Acid injection (Hypercalcemia, Oncology) What is this medicine? ZOLEDRONIC ACID (ZOE le dron ik AS id) lowers the amount of calcium loss from bone. It is used to treat too much calcium in your blood from cancer. It is also used to prevent complications of cancer that has spread to the bone. This medicine may be used for other purposes; ask your health care provider or pharmacist if you have questions. COMMON BRAND NAME(S): Zometa What should I tell my health care provider before I take this medicine? They need to know if you have any of these conditions:  aspirin-sensitive asthma  cancer, especially if you are receiving medicines used to treat cancer  dental disease or wear dentures  infection  kidney disease  receiving corticosteroids like dexamethasone or prednisone  an unusual or allergic reaction to zoledronic acid, other medicines, foods, dyes, or preservatives  pregnant or trying to get pregnant  breast-feeding How should I use this medicine? This medicine is for infusion into a vein. It is given by a health care professional in a hospital or clinic setting. Talk to your pediatrician regarding the use of this medicine in children. Special care may be needed. Overdosage: If you think you have taken too much of this medicine contact a poison control center or emergency room at once. NOTE: This medicine is only for you. Do not share this medicine with others. What if I miss a dose? It is important not to miss your dose. Call your doctor or health care professional if you are unable to keep an appointment. What may interact with this medicine?  certain antibiotics given by injection  NSAIDs, medicines for pain and inflammation, like ibuprofen or naproxen  some diuretics like bumetanide, furosemide  teriparatide  thalidomide This list may not describe all possible interactions. Give your health care provider a list of all the medicines, herbs, non-prescription  drugs, or dietary supplements you use. Also tell them if you smoke, drink alcohol, or use illegal drugs. Some items may interact with your medicine. What should I watch for while using this medicine? Visit your doctor or health care professional for regular checkups. It may be some time before you see the benefit from this medicine. Do not stop taking your medicine unless your doctor tells you to. Your doctor may order blood tests or other tests to see how you are doing. Women should inform their doctor if they wish to become pregnant or think they might be pregnant. There is a potential for serious side effects to an unborn child. Talk to your health care professional or pharmacist for more information. You should make sure that you get enough calcium and vitamin D while you are taking this medicine. Discuss the foods you eat and the vitamins you take with your health care professional. Some people who take this medicine have severe bone, joint, and/or muscle pain. This medicine may also increase your risk for jaw problems or a broken thigh bone. Tell your doctor right away if you have severe pain in your jaw, bones, joints, or muscles. Tell your doctor if you have any pain that does not go away or that gets worse. Tell your dentist and dental surgeon that you are taking this medicine. You should not have major dental surgery while on this medicine. See your dentist to have a dental exam and fix any dental problems before starting this medicine. Take good care of your teeth while on this medicine. Make sure you see your dentist for regular follow-up   appointments. What side effects may I notice from receiving this medicine? Side effects that you should report to your doctor or health care professional as soon as possible:  allergic reactions like skin rash, itching or hives, swelling of the face, lips, or tongue  anxiety, confusion, or depression  breathing problems  changes in vision  eye  pain  feeling faint or lightheaded, falls  jaw pain, especially after dental work  mouth sores  muscle cramps, stiffness, or weakness  redness, blistering, peeling or loosening of the skin, including inside the mouth  trouble passing urine or change in the amount of urine Side effects that usually do not require medical attention (report to your doctor or health care professional if they continue or are bothersome):  bone, joint, or muscle pain  constipation  diarrhea  fever  hair loss  irritation at site where injected  loss of appetite  nausea, vomiting  stomach upset  trouble sleeping  trouble swallowing  weak or tired This list may not describe all possible side effects. Call your doctor for medical advice about side effects. You may report side effects to FDA at 1-800-FDA-1088. Where should I keep my medicine? This drug is given in a hospital or clinic and will not be stored at home. NOTE: This sheet is a summary. It may not cover all possible information. If you have questions about this medicine, talk to your doctor, pharmacist, or health care provider.  2020 Elsevier/Gold Standard (2013-08-14 14:19:39)  

## 2020-01-14 ENCOUNTER — Telehealth: Payer: Self-pay | Admitting: Internal Medicine

## 2020-01-14 NOTE — Telephone Encounter (Signed)
Scheduled per los. Called and left msg. mailed printout  

## 2020-03-02 DIAGNOSIS — E559 Vitamin D deficiency, unspecified: Secondary | ICD-10-CM | POA: Diagnosis not present

## 2020-03-02 DIAGNOSIS — R7303 Prediabetes: Secondary | ICD-10-CM | POA: Diagnosis not present

## 2020-03-02 DIAGNOSIS — I10 Essential (primary) hypertension: Secondary | ICD-10-CM | POA: Diagnosis not present

## 2020-03-02 DIAGNOSIS — Z23 Encounter for immunization: Secondary | ICD-10-CM | POA: Diagnosis not present

## 2020-03-02 DIAGNOSIS — Z79899 Other long term (current) drug therapy: Secondary | ICD-10-CM | POA: Diagnosis not present

## 2020-03-02 DIAGNOSIS — E7800 Pure hypercholesterolemia, unspecified: Secondary | ICD-10-CM | POA: Diagnosis not present

## 2020-03-28 DIAGNOSIS — Z01818 Encounter for other preprocedural examination: Secondary | ICD-10-CM | POA: Diagnosis not present

## 2020-03-28 DIAGNOSIS — Z86010 Personal history of colonic polyps: Secondary | ICD-10-CM | POA: Diagnosis not present

## 2020-04-07 ENCOUNTER — Telehealth: Payer: Self-pay | Admitting: Internal Medicine

## 2020-04-07 NOTE — Telephone Encounter (Signed)
Called pt per 1/7 sch msg - left message for pt with appt date and time

## 2020-04-11 ENCOUNTER — Other Ambulatory Visit: Payer: Self-pay | Admitting: Physician Assistant

## 2020-04-11 DIAGNOSIS — C9000 Multiple myeloma not having achieved remission: Secondary | ICD-10-CM

## 2020-04-13 ENCOUNTER — Inpatient Hospital Stay: Payer: Medicare Other | Attending: Internal Medicine

## 2020-04-13 ENCOUNTER — Inpatient Hospital Stay: Payer: Medicare Other

## 2020-04-13 ENCOUNTER — Ambulatory Visit: Payer: Medicare Other

## 2020-04-13 ENCOUNTER — Other Ambulatory Visit: Payer: Self-pay

## 2020-04-13 VITALS — BP 124/69 | HR 76 | Temp 98.6°F | Resp 18

## 2020-04-13 DIAGNOSIS — C9000 Multiple myeloma not having achieved remission: Secondary | ICD-10-CM

## 2020-04-13 LAB — CMP (CANCER CENTER ONLY)
ALT: 13 U/L (ref 0–44)
AST: 16 U/L (ref 15–41)
Albumin: 4 g/dL (ref 3.5–5.0)
Alkaline Phosphatase: 51 U/L (ref 38–126)
Anion gap: 6 (ref 5–15)
BUN: 13 mg/dL (ref 8–23)
CO2: 28 mmol/L (ref 22–32)
Calcium: 9.4 mg/dL (ref 8.9–10.3)
Chloride: 109 mmol/L (ref 98–111)
Creatinine: 1.05 mg/dL — ABNORMAL HIGH (ref 0.44–1.00)
GFR, Estimated: 55 mL/min — ABNORMAL LOW (ref >60)
Glucose, Bld: 103 mg/dL — ABNORMAL HIGH (ref 70–99)
Potassium: 3.9 mmol/L (ref 3.5–5.1)
Sodium: 143 mmol/L (ref 135–145)
Total Bilirubin: 0.5 mg/dL (ref 0.3–1.2)
Total Protein: 7.7 g/dL (ref 6.5–8.1)

## 2020-04-13 MED ORDER — SODIUM CHLORIDE 0.9 % IV SOLN
INTRAVENOUS | Status: DC
  Filled 2020-04-13: qty 250

## 2020-04-13 MED ORDER — ZOLEDRONIC ACID 4 MG/100ML IV SOLN
INTRAVENOUS | Status: AC
  Filled 2020-04-13: qty 100

## 2020-04-13 MED ORDER — ZOLEDRONIC ACID 4 MG/5ML IV CONC: 4.0000 mg | Freq: Once | INTRAVENOUS | Status: DC

## 2020-04-13 MED ORDER — ZOLEDRONIC ACID 4 MG/100ML IV SOLN
4.0000 mg | Freq: Once | INTRAVENOUS | Status: AC
  Administered 2020-04-13: 4 mg via INTRAVENOUS

## 2020-04-13 NOTE — Patient Instructions (Signed)
Zoledronic Acid Injection (Hypercalcemia, Oncology) What is this medicine? ZOLEDRONIC ACID (ZOE le dron ik AS id) slows calcium loss from bones. It high calcium levels in the blood from some kinds of cancer. It may be used in other people at risk for bone loss. This medicine may be used for other purposes; ask your health care provider or pharmacist if you have questions. COMMON BRAND NAME(S): Zometa What should I tell my health care provider before I take this medicine? They need to know if you have any of these conditions:  cancer  dehydration  dental disease  kidney disease  liver disease  low levels of calcium in the blood  lung or breathing disease (asthma)  receiving steroids like dexamethasone or prednisone  an unusual or allergic reaction to zoledronic acid, other medicines, foods, dyes, or preservatives  pregnant or trying to get pregnant  breast-feeding How should I use this medicine? This drug is injected into a vein. It is given by a health care provider in a hospital or clinic setting. Talk to your health care provider about the use of this drug in children. Special care may be needed. Overdosage: If you think you have taken too much of this medicine contact a poison control center or emergency room at once. NOTE: This medicine is only for you. Do not share this medicine with others. What if I miss a dose? Keep appointments for follow-up doses. It is important not to miss your dose. Call your health care provider if you are unable to keep an appointment. What may interact with this medicine?  certain antibiotics given by injection  NSAIDs, medicines for pain and inflammation, like ibuprofen or naproxen  some diuretics like bumetanide, furosemide  teriparatide  thalidomide This list may not describe all possible interactions. Give your health care provider a list of all the medicines, herbs, non-prescription drugs, or dietary supplements you use. Also tell  them if you smoke, drink alcohol, or use illegal drugs. Some items may interact with your medicine. What should I watch for while using this medicine? Visit your health care provider for regular checks on your progress. It may be some time before you see the benefit from this drug. Some people who take this drug have severe bone, joint, or muscle pain. This drug may also increase your risk for jaw problems or a broken thigh bone. Tell your health care provider right away if you have severe pain in your jaw, bones, joints, or muscles. Tell you health care provider if you have any pain that does not go away or that gets worse. Tell your dentist and dental surgeon that you are taking this drug. You should not have major dental surgery while on this drug. See your dentist to have a dental exam and fix any dental problems before starting this drug. Take good care of your teeth while on this drug. Make sure you see your dentist for regular follow-up appointments. You should make sure you get enough calcium and vitamin D while you are taking this drug. Discuss the foods you eat and the vitamins you take with your health care provider. Check with your health care provider if you have severe diarrhea, nausea, and vomiting, or if you sweat a lot. The loss of too much body fluid may make it dangerous for you to take this drug. You may need blood work done while you are taking this drug. Do not become pregnant while taking this drug. Women should inform their health care provider   if they wish to become pregnant or think they might be pregnant. There is potential for serious harm to an unborn child. Talk to your health care provider for more information. What side effects may I notice from receiving this medicine? Side effects that you should report to your doctor or health care provider as soon as possible:  allergic reactions (skin rash, itching or hives; swelling of the face, lips, or tongue)  bone  pain  infection (fever, chills, cough, sore throat, pain or trouble passing urine)  jaw pain, especially after dental work  joint pain  kidney injury (trouble passing urine or change in the amount of urine)  low blood pressure (dizziness; feeling faint or lightheaded, falls; unusually weak or tired)  low calcium levels (fast heartbeat; muscle cramps or pain; pain, tingling, or numbness in the hands or feet; seizures)  low magnesium levels (fast, irregular heartbeat; muscle cramp or pain; muscle weakness; tremors; seizures)  low red blood cell counts (trouble breathing; feeling faint; lightheaded, falls; unusually weak or tired)  muscle pain  redness, blistering, peeling, or loosening of the skin, including inside the mouth  severe diarrhea  swelling of the ankles, feet, hands  trouble breathing Side effects that usually do not require medical attention (report to your doctor or health care provider if they continue or are bothersome):  anxious  constipation  coughing  depressed mood  eye irritation, itching, or pain  fever  general ill feeling or flu-like symptoms  nausea  pain, redness, or irritation at site where injected  trouble sleeping This list may not describe all possible side effects. Call your doctor for medical advice about side effects. You may report side effects to FDA at 1-800-FDA-1088. Where should I keep my medicine? This drug is given in a hospital or clinic. It will not be stored at home. NOTE: This sheet is a summary. It may not cover all possible information. If you have questions about this medicine, talk to your doctor, pharmacist, or health care provider.  2021 Elsevier/Gold Standard (2018-12-31 09:13:00)  

## 2020-05-04 ENCOUNTER — Other Ambulatory Visit: Payer: Self-pay | Admitting: Medical Oncology

## 2020-05-04 DIAGNOSIS — C9000 Multiple myeloma not having achieved remission: Secondary | ICD-10-CM

## 2020-05-04 MED ORDER — GABAPENTIN 600 MG PO TABS: 600.0000 mg | ORAL_TABLET | Freq: Two times a day (BID) | ORAL | 0 refills | Status: AC

## 2020-07-06 ENCOUNTER — Other Ambulatory Visit: Payer: Self-pay

## 2020-07-06 ENCOUNTER — Inpatient Hospital Stay: Payer: Medicare Other | Attending: Internal Medicine

## 2020-07-06 DIAGNOSIS — C9000 Multiple myeloma not having achieved remission: Secondary | ICD-10-CM | POA: Diagnosis not present

## 2020-07-06 LAB — CMP (CANCER CENTER ONLY)
ALT: 12 U/L (ref 0–44)
AST: 17 U/L (ref 15–41)
Albumin: 3.9 g/dL (ref 3.5–5.0)
Alkaline Phosphatase: 62 U/L (ref 38–126)
Anion gap: 11 (ref 5–15)
BUN: 13 mg/dL (ref 8–23)
CO2: 28 mmol/L (ref 22–32)
Calcium: 9.1 mg/dL (ref 8.9–10.3)
Chloride: 105 mmol/L (ref 98–111)
Creatinine: 0.92 mg/dL (ref 0.44–1.00)
GFR, Estimated: >60 mL/min (ref >60)
Glucose, Bld: 98 mg/dL (ref 70–99)
Potassium: 3.6 mmol/L (ref 3.5–5.1)
Sodium: 144 mmol/L (ref 135–145)
Total Bilirubin: 0.4 mg/dL (ref 0.3–1.2)
Total Protein: 7.7 g/dL (ref 6.5–8.1)

## 2020-07-06 LAB — CBC WITH DIFFERENTIAL (CANCER CENTER ONLY)
Abs Immature Granulocytes: 0.03 10*3/uL (ref 0.00–0.07)
Basophils Absolute: 0 10*3/uL (ref 0.0–0.1)
Basophils Relative: 1 %
Eosinophils Absolute: 0 10*3/uL (ref 0.0–0.5)
Eosinophils Relative: 1 %
HCT: 35.7 % — ABNORMAL LOW (ref 36.0–46.0)
Hemoglobin: 11.5 g/dL — ABNORMAL LOW (ref 12.0–15.0)
Immature Granulocytes: 1 %
Lymphocytes Relative: 27 %
Lymphs Abs: 1.8 10*3/uL (ref 0.7–4.0)
MCH: 31.3 pg (ref 26.0–34.0)
MCHC: 32.2 g/dL (ref 30.0–36.0)
MCV: 97 fL (ref 80.0–100.0)
Monocytes Absolute: 0.6 10*3/uL (ref 0.1–1.0)
Monocytes Relative: 9 %
Neutro Abs: 4 10*3/uL (ref 1.7–7.7)
Neutrophils Relative %: 61 %
Platelet Count: 226 10*3/uL (ref 150–400)
RBC: 3.68 MIL/uL — ABNORMAL LOW (ref 3.87–5.11)
RDW: 13.7 % (ref 11.5–15.5)
WBC Count: 6.4 10*3/uL (ref 4.0–10.5)
nRBC: 0 % (ref 0.0–0.2)

## 2020-07-06 LAB — LACTATE DEHYDROGENASE: LDH: 189 U/L (ref 98–192)

## 2020-07-07 LAB — KAPPA/LAMBDA LIGHT CHAINS
Kappa free light chain: 41.4 mg/L — ABNORMAL HIGH (ref 3.3–19.4)
Kappa, lambda light chain ratio: 2.84 — ABNORMAL HIGH (ref 0.26–1.65)
Lambda free light chains: 14.6 mg/L (ref 5.7–26.3)

## 2020-07-07 LAB — IGG, IGA, IGM
IgA: 359 mg/dL (ref 64–422)
IgG (Immunoglobin G), Serum: 1401 mg/dL (ref 586–1602)
IgM (Immunoglobulin M), Srm: 41 mg/dL (ref 26–217)

## 2020-07-07 LAB — BETA 2 MICROGLOBULIN, SERUM: Beta-2 Microglobulin: 1.6 mg/L (ref 0.6–2.4)

## 2020-07-13 ENCOUNTER — Other Ambulatory Visit: Payer: Self-pay

## 2020-07-13 ENCOUNTER — Inpatient Hospital Stay: Payer: Medicare Other

## 2020-07-13 ENCOUNTER — Inpatient Hospital Stay (HOSPITAL_BASED_OUTPATIENT_CLINIC_OR_DEPARTMENT_OTHER): Payer: Medicare Other | Admitting: Internal Medicine

## 2020-07-13 VITALS — BP 159/82 | HR 72 | Temp 96.5°F | Resp 18 | Ht 65.0 in | Wt 154.7 lb

## 2020-07-13 VITALS — BP 150/82 | HR 67 | Temp 97.5°F | Resp 18

## 2020-07-13 DIAGNOSIS — C9000 Multiple myeloma not having achieved remission: Secondary | ICD-10-CM | POA: Diagnosis not present

## 2020-07-13 MED ORDER — ZOLEDRONIC ACID 4 MG/100ML IV SOLN
INTRAVENOUS | Status: AC
  Filled 2020-07-13: qty 100

## 2020-07-13 MED ORDER — SODIUM CHLORIDE 0.9 % IV SOLN
Freq: Once | INTRAVENOUS | Status: DC
  Filled 2020-07-13: qty 250

## 2020-07-13 MED ORDER — ZOLEDRONIC ACID 4 MG/100ML IV SOLN
4.0000 mg | Freq: Once | INTRAVENOUS | Status: AC
  Administered 2020-07-13: 4 mg via INTRAVENOUS

## 2020-07-13 MED ORDER — ZOLEDRONIC ACID 4 MG/5ML IV CONC
4.0000 mg | Freq: Once | INTRAVENOUS | Status: DC
  Filled 2020-07-13: qty 5

## 2020-07-13 NOTE — Patient Instructions (Signed)
Zoledronic Acid Injection (Hypercalcemia, Oncology) What is this medicine? ZOLEDRONIC ACID (ZOE le dron ik AS id) slows calcium loss from bones. It high calcium levels in the blood from some kinds of cancer. It may be used in other people at risk for bone loss. This medicine may be used for other purposes; ask your health care provider or pharmacist if you have questions. COMMON BRAND NAME(S): Zometa What should I tell my health care provider before I take this medicine? They need to know if you have any of these conditions:  cancer  dehydration  dental disease  kidney disease  liver disease  low levels of calcium in the blood  lung or breathing disease (asthma)  receiving steroids like dexamethasone or prednisone  an unusual or allergic reaction to zoledronic acid, other medicines, foods, dyes, or preservatives  pregnant or trying to get pregnant  breast-feeding How should I use this medicine? This drug is injected into a vein. It is given by a health care provider in a hospital or clinic setting. Talk to your health care provider about the use of this drug in children. Special care may be needed. Overdosage: If you think you have taken too much of this medicine contact a poison control center or emergency room at once. NOTE: This medicine is only for you. Do not share this medicine with others. What if I miss a dose? Keep appointments for follow-up doses. It is important not to miss your dose. Call your health care provider if you are unable to keep an appointment. What may interact with this medicine?  certain antibiotics given by injection  NSAIDs, medicines for pain and inflammation, like ibuprofen or naproxen  some diuretics like bumetanide, furosemide  teriparatide  thalidomide This list may not describe all possible interactions. Give your health care provider a list of all the medicines, herbs, non-prescription drugs, or dietary supplements you use. Also tell  them if you smoke, drink alcohol, or use illegal drugs. Some items may interact with your medicine. What should I watch for while using this medicine? Visit your health care provider for regular checks on your progress. It may be some time before you see the benefit from this drug. Some people who take this drug have severe bone, joint, or muscle pain. This drug may also increase your risk for jaw problems or a broken thigh bone. Tell your health care provider right away if you have severe pain in your jaw, bones, joints, or muscles. Tell you health care provider if you have any pain that does not go away or that gets worse. Tell your dentist and dental surgeon that you are taking this drug. You should not have major dental surgery while on this drug. See your dentist to have a dental exam and fix any dental problems before starting this drug. Take good care of your teeth while on this drug. Make sure you see your dentist for regular follow-up appointments. You should make sure you get enough calcium and vitamin D while you are taking this drug. Discuss the foods you eat and the vitamins you take with your health care provider. Check with your health care provider if you have severe diarrhea, nausea, and vomiting, or if you sweat a lot. The loss of too much body fluid may make it dangerous for you to take this drug. You may need blood work done while you are taking this drug. Do not become pregnant while taking this drug. Women should inform their health care provider   if they wish to become pregnant or think they might be pregnant. There is potential for serious harm to an unborn child. Talk to your health care provider for more information. What side effects may I notice from receiving this medicine? Side effects that you should report to your doctor or health care provider as soon as possible:  allergic reactions (skin rash, itching or hives; swelling of the face, lips, or tongue)  bone  pain  infection (fever, chills, cough, sore throat, pain or trouble passing urine)  jaw pain, especially after dental work  joint pain  kidney injury (trouble passing urine or change in the amount of urine)  low blood pressure (dizziness; feeling faint or lightheaded, falls; unusually weak or tired)  low calcium levels (fast heartbeat; muscle cramps or pain; pain, tingling, or numbness in the hands or feet; seizures)  low magnesium levels (fast, irregular heartbeat; muscle cramp or pain; muscle weakness; tremors; seizures)  low red blood cell counts (trouble breathing; feeling faint; lightheaded, falls; unusually weak or tired)  muscle pain  redness, blistering, peeling, or loosening of the skin, including inside the mouth  severe diarrhea  swelling of the ankles, feet, hands  trouble breathing Side effects that usually do not require medical attention (report to your doctor or health care provider if they continue or are bothersome):  anxious  constipation  coughing  depressed mood  eye irritation, itching, or pain  fever  general ill feeling or flu-like symptoms  nausea  pain, redness, or irritation at site where injected  trouble sleeping This list may not describe all possible side effects. Call your doctor for medical advice about side effects. You may report side effects to FDA at 1-800-FDA-1088. Where should I keep my medicine? This drug is given in a hospital or clinic. It will not be stored at home. NOTE: This sheet is a summary. It may not cover all possible information. If you have questions about this medicine, talk to your doctor, pharmacist, or health care provider.  2021 Elsevier/Gold Standard (2018-12-31 09:13:00)  

## 2020-07-13 NOTE — Progress Notes (Signed)
[facility] Telephone:([phone]   Fax:(336) [place], MD [address] [place] [address]  DIAGNOSIS: Multiple myeloma, Light chain diagnosed in August 2010  PRIOR THERAPY: 1) status post systemic chemotherapy with Velcade, Doxil and Decadron between 04/24/2009 through 07/27/2009 discontinued secondary to peripheral neuropathy. 2) status post treatment with Revlimid and Decadron between 09/01/2009 through December 2011. 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 Miki Kins. 4) maintenance chemotherapy with Revlimid 10 mg by mouth daily started June 2012. This will be changed to 5 mg by mouth daily starting next week secondary to persistent diarrhea.  CURRENT THERAPY: 1) Zometa 4 mg IV every 3 months.  INTERVAL HISTORY: Carmen Fisher 80 y.o. female returns to the clinic today for 48-monthfollow-up visit.  The patient is feeling fine today with no concerning complaints.  She has an episode of abdominal pain secondary to gallbladder stones in January 2022.  She was treated medically.  She denied having any current chest pain, shortness of breath, cough or hemoptysis.  She denied having any fever or chills.  She has no nausea, vomiting, diarrhea or constipation.  She has no headache or visual changes.  The patient had repeat myeloma panel performed recently and she is here today for evaluation and discussion of her lab results.  MEDICAL HISTORY: Past Medical History:  Diagnosis Date  . Dyslipidemia   . GERD (gastroesophageal reflux disease)   . Gout    09-05-2017  per pt stable,  last flare-up long time ago  . [place]   @ [facility]  . History of acute pyelonephritis 08/16/2017  . History of chemotherapy 04-24-2009  to 07-27-2009   systemic  . History of sepsis    09/ 2014 secondary to UTI/   08-16-2017  SIRS, severe sepsis due to acute pyelonephritis with kidney stone obstruction  . Hypokalemia   . Intermittent diarrhea   . Multiple myeloma in remission ([facility] oncologist-  [facility] [place] 07-[place]-2009 , s/p  high dose melphalan and autologous stem cell transplant 04-26-2010 @ [facility],  maintenance oral chemo (revlimid) started 06/ 2012--stopped 03/ 2019 due to neutropenia and diarrhea,  Zometa IV every 3 months  . Osteoarthritis   . Peripheral [place] due to [place]   . Renal calculus, right   . Right ureteral calculus   . Wears dentures    upper  . Wears glasses     ALLERGIES:  is allergic to aspirin, ampicillin, sulfa antibiotics, and penicillins.  MEDICATIONS:  Current Outpatient Medications  Medication Sig Dispense Refill  . acetaminophen (TYLENOL) 500 MG tablet Take 500 mg by mouth every 6 (six) hours as needed.    .Marland Kitchenallopurinol (ZYLOPRIM) 300 MG tablet TAKE 1 TABLET DAILY 90 tablet 0  . cholecalciferol (VITAMIN D) 1000 UNITS tablet Take 1,000 Units by mouth daily.    . dorzolamide-timolol (COSOPT) 22.3-6.8 MG/ML ophthalmic solution Place 1 drop into both eyes 2 (two) times daily.     .Marland Kitchengabapentin (NEURONTIN) 600 MG tablet Take 1 tablet (600 mg total) by mouth 2 (two) times daily. 60 tablet 0  . HYDROcodone-acetaminophen (NORCO) 10-325 MG tablet Take 1-2 tablets by mouth every 4 (four) hours as needed for moderate pain. Maximum dose per 24 hours -  8 pills 14 tablet 0  . latanoprost (XALATAN) 0.005 % ophthalmic solution Place 1 drop into both eyes at bedtime.     Marland Kitchen loperamide (IMODIUM) 2 MG capsule TAKE 1 CAPSULE AS NEEDED   FOR DIARRHEA OR LOOSE STOOL(TAKE UP TO 6 CAPSULES     DAILY) 30 capsule 1  . LORazepam (ATIVAN) 1 MG tablet Take 1 mg by mouth every 4 (four) hours as needed (neuropathy). Reported on 09/14/2015    . Multiple Vitamin (MULTIVITAMIN WITH MINERALS) TABS  tablet Take 1 tablet by mouth daily.    . pantoprazole (PROTONIX) 40 MG tablet Take 1 tablet (40 mg total) by mouth daily. (Patient taking differently: Take 20 mg by mouth daily. Takes 1/2 tablet) 90 tablet 0  . potassium chloride SA (KLOR-CON M20) 20 MEQ tablet TAKE 1 AND 1/2 TABLETS     DAILY.--- TAKE 1 TABLET IN THE MORNING AND 1/2 TABLET IN THE EVENING 135 tablet 0   No current facility-administered medications for this visit.    SURGICAL HISTORY:  Past Surgical History:  Procedure Laterality Date  . APPENDECTOMY  1990s  . CARPAL TUNNEL RELEASE Left 10-14-2001   [facility]   AND A-1 PULLEY RELEASE LEFT RING FINGER  . CYSTOSCOPY W/ URETERAL STENT PLACEMENT Right 08/16/2017   Procedure: CYSTOSCOPY WITH RETROGRADE PYELOGRAM/URETERAL STENT PLACEMENT;  Surgeon: Ardis Hughs, MD;  Location: [place];  Service: Urology;  Laterality: Right;  . CYSTOSCOPY/RETROGRADE/URETEROSCOPY Right 09/08/2017   Procedure: CYSTOSCOPY/RETROGRADE/URETEROSCOPY/STONE EXTRACTION/ STENT EXCHANGE, STONE BASKET RETRIVAL, LASER;  Surgeon: Kathie Rhodes, MD;  Location: [facility];  Service: Urology;  Laterality: Right;  Marland Kitchen VAGINAL HYSTERECTOMY  1994   W/  BSO    REVIEW OF SYSTEMS:  A comprehensive review of systems was negative.   PHYSICAL EXAMINATION: General appearance: alert, cooperative and no distress Head: Normocephalic, without obvious abnormality, atraumatic Neck: no adenopathy, no JVD, supple, symmetrical, trachea midline and thyroid not enlarged, symmetric, no tenderness/mass/nodules Lymph nodes: Cervical, supraclavicular, and axillary nodes normal. Resp: clear to auscultation bilaterally Back: symmetric, no curvature. ROM normal. No CVA tenderness. Cardio: regular rate and rhythm, S1, S2 normal, no murmur, click, rub or gallop GI: soft, non-tender; bowel sounds normal; no masses,  no organomegaly Extremities: extremities normal, atraumatic, no cyanosis or edema  ECOG PERFORMANCE STATUS: 1 -  Symptomatic but completely ambulatory  Blood pressure (!) 159/82, pulse 72, temperature (!) 96.5 F (35.8 C), temperature source Tympanic, resp. rate 18, height 5' 5"  (1.651 m), weight 154 lb 11.2 oz (70.2 kg), SpO2 100 %.  LABORATORY DATA: Lab Results  Component Value Date   WBC 6.4 07/06/2020   HGB 11.5 (L) 07/06/2020   HCT 35.7 (L) 07/06/2020   MCV 97.0 07/06/2020   PLT 226 07/06/2020      Chemistry      Component Value Date/Time   NA 144 07/06/2020 0929   NA 141 03/06/2017 1024   K 3.6 07/06/2020 0929   K 3.9 03/06/2017 1024   CL 105 07/06/2020 0929   CL 106 07/28/2012 0924   CO2 28 07/06/2020 0929   CO2 25 03/06/2017 1024   BUN 13 07/06/2020 0929   BUN 14.3 03/06/2017 1024   CREATININE 0.92 07/06/2020 0929   CREATININE 1.0 03/06/2017 1024      Component Value Date/Time   CALCIUM 9.1 07/06/2020 0929   CALCIUM 9.8 03/06/2017 1024   ALKPHOS 62 07/06/2020 0929   ALKPHOS 74 03/06/2017 1024   AST 17 07/06/2020 0929   AST 16 03/06/2017 1024  ALT 12 07/06/2020 0929   ALT 15 03/06/2017 1024   BILITOT 0.4 07/06/2020 0929   BILITOT 0.57 03/06/2017 1024       RADIOGRAPHIC STUDIES: No results found.  ASSESSMENT AND PLAN:  This is a very pleasant 80 years old African-American female with multiple myeloma and has been on maintenance Revlimid currently 5 mg by mouth daily and has been on this treatment for almost 6 years now. Her treatment was discontinued secondary to neutropenia and persistent diarrhea.   The patient is currently on observation and she is doing fine today with no concerning complaints. She had repeat myeloma panel performed recently.  I discussed the lab results with the patient today. There is no evidence for disease progression on her lab work. I recommended for her to continue on observation with repeat myeloma panel in 6 months. For the bone disease from the multiple myeloma, she will continue her treatment with Zometa every 3 months. The patient  was advised to call immediately if she has any concerning symptoms in the interval.  The patient voices understanding of current disease status and treatment options and is in agreement with the current care plan.  All questions were answered. The patient knows to call the clinic with any problems, questions or concerns. We can certainly see the patient much sooner if necessary.  Disclaimer: This note was dictated with voice recognition software. Similar sounding words can inadvertently be transcribed and may not be corrected upon review.

## 2020-08-03 ENCOUNTER — Telehealth: Payer: Self-pay | Admitting: Internal Medicine

## 2020-08-03 NOTE — Telephone Encounter (Signed)
Sch per 4/14 los

## 2020-08-03 NOTE — Telephone Encounter (Signed)
Sch per 4/14 los, left message

## 2020-10-11 ENCOUNTER — Other Ambulatory Visit: Payer: Self-pay | Admitting: Medical Oncology

## 2020-10-11 DIAGNOSIS — C9000 Multiple myeloma not having achieved remission: Secondary | ICD-10-CM

## 2020-10-12 ENCOUNTER — Inpatient Hospital Stay: Payer: Medicare Other | Attending: Internal Medicine

## 2020-10-12 ENCOUNTER — Other Ambulatory Visit: Payer: Self-pay

## 2020-10-12 ENCOUNTER — Inpatient Hospital Stay: Payer: Medicare Other

## 2020-10-12 VITALS — BP 132/72 | HR 88 | Temp 98.2°F | Resp 18

## 2020-10-12 DIAGNOSIS — C9000 Multiple myeloma not having achieved remission: Secondary | ICD-10-CM | POA: Diagnosis present

## 2020-10-12 LAB — CMP (CANCER CENTER ONLY)
ALT: 12 U/L (ref 0–44)
AST: 18 U/L (ref 15–41)
Albumin: 3.7 g/dL (ref 3.5–5.0)
Alkaline Phosphatase: 56 U/L (ref 38–126)
Anion gap: 7 (ref 5–15)
BUN: 15 mg/dL (ref 8–23)
CO2: 28 mmol/L (ref 22–32)
Calcium: 8.9 mg/dL (ref 8.9–10.3)
Chloride: 108 mmol/L (ref 98–111)
Creatinine: 0.92 mg/dL (ref 0.44–1.00)
GFR, Estimated: >60 mL/min (ref >60)
Glucose, Bld: 92 mg/dL (ref 70–99)
Potassium: 3.6 mmol/L (ref 3.5–5.1)
Sodium: 143 mmol/L (ref 135–145)
Total Bilirubin: 0.5 mg/dL (ref 0.3–1.2)
Total Protein: 7.3 g/dL (ref 6.5–8.1)

## 2020-10-12 LAB — CBC WITH DIFFERENTIAL (CANCER CENTER ONLY)
Abs Immature Granulocytes: 0.02 10*3/uL (ref 0.00–0.07)
Basophils Absolute: 0 10*3/uL (ref 0.0–0.1)
Basophils Relative: 0 %
Eosinophils Absolute: 0 10*3/uL (ref 0.0–0.5)
Eosinophils Relative: 1 %
HCT: 32.8 % — ABNORMAL LOW (ref 36.0–46.0)
Hemoglobin: 10.9 g/dL — ABNORMAL LOW (ref 12.0–15.0)
Immature Granulocytes: 0 %
Lymphocytes Relative: 28 %
Lymphs Abs: 2.2 10*3/uL (ref 0.7–4.0)
MCH: 31.9 pg (ref 26.0–34.0)
MCHC: 33.2 g/dL (ref 30.0–36.0)
MCV: 95.9 fL (ref 80.0–100.0)
Monocytes Absolute: 0.6 10*3/uL (ref 0.1–1.0)
Monocytes Relative: 8 %
Neutro Abs: 4.9 10*3/uL (ref 1.7–7.7)
Neutrophils Relative %: 63 %
Platelet Count: 194 10*3/uL (ref 150–400)
RBC: 3.42 MIL/uL — ABNORMAL LOW (ref 3.87–5.11)
RDW: 14.6 % (ref 11.5–15.5)
WBC Count: 7.7 10*3/uL (ref 4.0–10.5)
nRBC: 0 % (ref 0.0–0.2)

## 2020-10-12 MED ORDER — ZOLEDRONIC ACID 4 MG/100ML IV SOLN
4.0000 mg | Freq: Once | INTRAVENOUS | Status: AC
  Administered 2020-10-12: 4 mg via INTRAVENOUS

## 2020-10-12 NOTE — Patient Instructions (Signed)

## 2020-12-28 ENCOUNTER — Other Ambulatory Visit: Payer: Self-pay | Admitting: Internal Medicine

## 2020-12-28 DIAGNOSIS — Z1231 Encounter for screening mammogram for malignant neoplasm of breast: Secondary | ICD-10-CM

## 2021-01-11 ENCOUNTER — Inpatient Hospital Stay (HOSPITAL_BASED_OUTPATIENT_CLINIC_OR_DEPARTMENT_OTHER): Payer: Medicare Other | Admitting: Internal Medicine

## 2021-01-11 ENCOUNTER — Inpatient Hospital Stay: Payer: Medicare Other | Attending: Internal Medicine

## 2021-01-11 ENCOUNTER — Other Ambulatory Visit: Payer: Self-pay

## 2021-01-11 ENCOUNTER — Inpatient Hospital Stay: Payer: Medicare Other

## 2021-01-11 VITALS — BP 143/90 | HR 71 | Temp 97.6°F | Resp 18 | Ht 65.0 in | Wt 156.0 lb

## 2021-01-11 DIAGNOSIS — C9000 Multiple myeloma not having achieved remission: Secondary | ICD-10-CM

## 2021-01-11 LAB — CBC WITH DIFFERENTIAL (CANCER CENTER ONLY)
Abs Immature Granulocytes: 0.01 10*3/uL (ref 0.00–0.07)
Basophils Absolute: 0 10*3/uL (ref 0.0–0.1)
Basophils Relative: 1 %
Eosinophils Absolute: 0.1 10*3/uL (ref 0.0–0.5)
Eosinophils Relative: 1 %
HCT: 34.5 % — ABNORMAL LOW (ref 36.0–46.0)
Hemoglobin: 11.3 g/dL — ABNORMAL LOW (ref 12.0–15.0)
Immature Granulocytes: 0 %
Lymphocytes Relative: 34 %
Lymphs Abs: 1.8 10*3/uL (ref 0.7–4.0)
MCH: 31.8 pg (ref 26.0–34.0)
MCHC: 32.8 g/dL (ref 30.0–36.0)
MCV: 97.2 fL (ref 80.0–100.0)
Monocytes Absolute: 0.5 10*3/uL (ref 0.1–1.0)
Monocytes Relative: 10 %
Neutro Abs: 2.8 10*3/uL (ref 1.7–7.7)
Neutrophils Relative %: 54 %
Platelet Count: 201 10*3/uL (ref 150–400)
RBC: 3.55 MIL/uL — ABNORMAL LOW (ref 3.87–5.11)
RDW: 13.6 % (ref 11.5–15.5)
WBC Count: 5.2 10*3/uL (ref 4.0–10.5)
nRBC: 0 % (ref 0.0–0.2)

## 2021-01-11 LAB — CMP (CANCER CENTER ONLY)
ALT: 16 U/L (ref 0–44)
AST: 19 U/L (ref 15–41)
Albumin: 4 g/dL (ref 3.5–5.0)
Alkaline Phosphatase: 47 U/L (ref 38–126)
Anion gap: 5 (ref 5–15)
BUN: 17 mg/dL (ref 8–23)
CO2: 28 mmol/L (ref 22–32)
Calcium: 9.2 mg/dL (ref 8.9–10.3)
Chloride: 105 mmol/L (ref 98–111)
Creatinine: 0.82 mg/dL (ref 0.44–1.00)
GFR, Estimated: >60 mL/min (ref >60)
Glucose, Bld: 95 mg/dL (ref 70–99)
Potassium: 3.6 mmol/L (ref 3.5–5.1)
Sodium: 138 mmol/L (ref 135–145)
Total Bilirubin: 0.5 mg/dL (ref 0.3–1.2)
Total Protein: 7.3 g/dL (ref 6.5–8.1)

## 2021-01-11 LAB — LACTATE DEHYDROGENASE: LDH: 151 U/L (ref 98–192)

## 2021-01-11 MED ORDER — SODIUM CHLORIDE 0.9 % IV SOLN: Freq: Once | INTRAVENOUS | Status: AC

## 2021-01-11 MED ORDER — ZOLEDRONIC ACID 4 MG/100ML IV SOLN
4.0000 mg | Freq: Once | INTRAVENOUS | Status: AC
  Administered 2021-01-11: 4 mg via INTRAVENOUS
  Filled 2021-01-11: qty 100

## 2021-01-11 NOTE — Patient Instructions (Signed)

## 2021-01-11 NOTE — Progress Notes (Signed)
[facility] Telephone:(336) [phone]   Fax:(336) [place], MD [address] [place] [address]  DIAGNOSIS: Multiple myeloma, Light chain diagnosed in August 2010  PRIOR THERAPY: 1) status post systemic chemotherapy with Velcade, Doxil and Decadron between 04/24/2009 through 07/27/2009 discontinued secondary to peripheral neuropathy. 2) status post treatment with Revlimid and Decadron between 09/01/2009 through December 2011. 3) status post peripheral blood autologous stem cell transplant on 04/26/2010 at [facility] under the care of Dr. Miki Kins. 4) maintenance chemotherapy with Revlimid 10 mg by mouth daily started June 2012. This will be changed to 5 mg by mouth daily starting next week secondary to persistent diarrhea.  CURRENT THERAPY: 1) Zometa 4 mg IV every 3 months.  INTERVAL HISTORY: Carmen Fisher 80 y.o. female returns to the clinic today for 18-monthfollow-up visit.  The patient is feeling fine today with no concerning complaints except for persistent diarrhea.  She denied having any current chest pain, shortness of breath, cough or hemoptysis.  She denied having any nausea, vomiting, abdominal pain or constipation.  She has no significant weight loss or night sweats.  The patient is here today for evaluation with repeat myeloma panel.  MEDICAL HISTORY: Past Medical History:  Diagnosis Date   Dyslipidemia    GERD (gastroesophageal reflux disease)    Gout    09-05-2017  per pt stable,  last flare-up long time ago   [place] [facility]   @ [facility]   History of acute pyelonephritis 08/16/2017   History of chemotherapy 04-24-2009  to 07-27-2009   systemic   History of sepsis    09/ 2014 secondary to UTI/  08-16-2017  SIRS, severe sepsis due to acute pyelonephritis with kidney stone obstruction   Hypokalemia    Intermittent diarrhea    Multiple myeloma in  remission ([facility] oncologist-  dr [facility]Julien Nordmann([facility] ) [facility]  [facility]-- [place], completed chemo 07-27-2009 , s/p  high dose melphalan and autologous stem cell transplant 04-26-2010 @ [facility],  maintenance oral chemo (revlimid) started 06/ 2012--stopped 03/ 2019 due to neutropenia and diarrhea,  Zometa IV every 3 months   Osteoarthritis    Peripheral neuropathy due to chemotherapy ([name]    Renal calculus, right    Right ureteral calculus    Wears dentures    upper   Wears glasses     ALLERGIES:  is allergic to aspirin, ampicillin, sulfa antibiotics, and penicillins.  MEDICATIONS:  Current Outpatient Medications  Medication Sig Dispense Refill   acetaminophen (TYLENOL) 500 MG tablet Take 500 mg by mouth every 6 (six) hours as needed.     allopurinol (ZYLOPRIM) 300 MG tablet Take 1 tablet by mouth daily.     cholecalciferol (VITAMIN D) 1000 UNITS tablet Take 1,000 Units by mouth daily.     dorzolamide-timolol (COSOPT) 22.3-6.8 MG/ML ophthalmic solution Place 1 drop into both eyes 2 (two) times daily.      gabapentin (NEURONTIN) 600 MG tablet Take 1 tablet (600 mg total) by mouth 2 (two) times daily. 60 tablet 0   HYDROcodone-acetaminophen (NORCO) 10-325 MG tablet Take 1-2 tablets by mouth every 4 (four) hours as needed for moderate pain. Maximum dose per 24 hours - 8 pills 14 tablet 0   latanoprost (XALATAN) 0.005 % ophthalmic solution Place 1 drop into both eyes at bedtime.      LORazepam (ATIVAN)  1 MG tablet Take 1 mg by mouth every 4 (four) hours as needed (neuropathy). Reported on 09/14/2015     Multiple Vitamin (MULTIVITAMIN WITH MINERALS) TABS tablet Take 1 tablet by mouth daily.     pantoprazole (PROTONIX) 40 MG tablet Take 1 tablet (40 mg total) by mouth daily. (Patient taking differently: Take 20 mg by mouth daily. Takes 1/2 tablet) 90 tablet 0   potassium chloride SA (KLOR-CON M20) 20 MEQ tablet TAKE 1 AND 1/2 TABLETS     DAILY.--- TAKE 1 TABLET  IN THE MORNING AND 1/2 TABLET IN THE EVENING 135 tablet 0   No current facility-administered medications for this visit.    SURGICAL HISTORY:  Past Surgical History:  Procedure Laterality Date   APPENDECTOMY  1990s   CARPAL TUNNEL RELEASE Left 10-14-2001   [place]   AND A-1 PULLEY RELEASE LEFT RING FINGER   CYSTOSCOPY W/ URETERAL STENT PLACEMENT Right 08/16/2017   Procedure: CYSTOSCOPY WITH RETROGRADE PYELOGRAM/URETERAL STENT PLACEMENT;  Surgeon: Ardis Hughs, MD;  Location: [place];  Service: Urology;  Laterality: Right;   CYSTOSCOPY/RETROGRADE/URETEROSCOPY Right 09/08/2017   Procedure: CYSTOSCOPY/RETROGRADE/URETEROSCOPY/STONE EXTRACTION/ STENT EXCHANGE, STONE BASKET RETRIVAL, LASER;  Surgeon: Kathie Rhodes, MD;  Location: [facility];  Service: Urology;  Laterality: Right;   VAGINAL HYSTERECTOMY  1994   W/  BSO    REVIEW OF SYSTEMS:  A comprehensive review of systems was negative except for: Gastrointestinal: positive for diarrhea   PHYSICAL EXAMINATION: General appearance: alert, cooperative, and no distress Head: Normocephalic, without obvious abnormality, atraumatic Neck: no adenopathy, no JVD, supple, symmetrical, trachea midline, and thyroid not enlarged, symmetric, no tenderness/mass/nodules Lymph nodes: Cervical, supraclavicular, and axillary nodes normal. Resp: clear to auscultation bilaterally Back: symmetric, no curvature. ROM normal. No CVA tenderness. Cardio: regular rate and rhythm, S1, S2 normal, no murmur, click, rub or gallop GI: soft, non-tender; bowel sounds normal; no masses,  no organomegaly Extremities: extremities normal, atraumatic, no cyanosis or edema  ECOG PERFORMANCE STATUS: 1 - Symptomatic but completely ambulatory  Blood pressure (!) 143/90, pulse 71, temperature 97.6 F (36.4 C), temperature source Tympanic, resp. rate 18, height 5' 5"  (1.651 m), weight 156 lb (70.8 kg), SpO2 98 %.  LABORATORY DATA: Lab Results  Component Value Date    WBC 5.2 01/11/2021   HGB 11.3 (L) 01/11/2021   HCT 34.5 (L) 01/11/2021   MCV 97.2 01/11/2021   PLT 201 01/11/2021      Chemistry      Component Value Date/Time   NA 143 10/12/2020 1137   NA 141 03/06/2017 1024   K 3.6 10/12/2020 1137   K 3.9 03/06/2017 1024   CL 108 10/12/2020 1137   CL 106 07/28/2012 0924   CO2 28 10/12/2020 1137   CO2 25 03/06/2017 1024   BUN 15 10/12/2020 1137   BUN 14.3 03/06/2017 1024   CREATININE 0.92 10/12/2020 1137   CREATININE 1.0 03/06/2017 1024      Component Value Date/Time   CALCIUM 8.9 10/12/2020 1137   CALCIUM 9.8 03/06/2017 1024   ALKPHOS 56 10/12/2020 1137   ALKPHOS 74 03/06/2017 1024   AST 18 10/12/2020 1137   AST 16 03/06/2017 1024   ALT 12 10/12/2020 1137   ALT 15 03/06/2017 1024   BILITOT 0.5 10/12/2020 1137   BILITOT 0.57 03/06/2017 1024       RADIOGRAPHIC STUDIES: No results found.  ASSESSMENT AND PLAN:  This is a very pleasant 80 years old African-American female with multiple myeloma and has been  on maintenance Revlimid currently 5 mg by mouth daily and has been on this treatment for almost 6 years. Her treatment was discontinued secondary to neutropenia and persistent diarrhea.   She is currently on observation and has been feeling fine with no concerning complaints except for the baseline persistent diarrhea. She had repeat myeloma panel performed earlier today.  Her CBC showed persistent mild anemia but better compared to 6 months ago.  The remaining myeloma panel are still pending. I recommended for her to continue on observation with repeat myeloma panel in 6 months unless there is any concerning findings on the pending blood work. For the bone disease, the patient will continue her treatment with Zometa every 3 months and she will receive a dose today. She was advised to call immediately if she has any other concerning symptoms in the interval.  The patient voices understanding of current disease status and treatment  options and is in agreement with the current care plan.  All questions were answered. The patient knows to call the clinic with any problems, questions or concerns. We can certainly see the patient much sooner if necessary.  Disclaimer: This note was dictated with voice recognition software. Similar sounding words can inadvertently be transcribed and may not be corrected upon review.

## 2021-01-12 LAB — KAPPA/LAMBDA LIGHT CHAINS
Kappa free light chain: 45.7 mg/L — ABNORMAL HIGH (ref 3.3–19.4)
Kappa, lambda light chain ratio: 3.52 — ABNORMAL HIGH (ref 0.26–1.65)
Lambda free light chains: 13 mg/L (ref 5.7–26.3)

## 2021-01-12 LAB — IGG, IGA, IGM
IgA: 345 mg/dL (ref 64–422)
IgG (Immunoglobin G), Serum: 1311 mg/dL (ref 586–1602)
IgM (Immunoglobulin M), Srm: 48 mg/dL (ref 26–217)

## 2021-01-12 LAB — BETA 2 MICROGLOBULIN, SERUM: Beta-2 Microglobulin: 1.5 mg/L (ref 0.6–2.4)

## 2021-02-01 ENCOUNTER — Ambulatory Visit
Admission: RE | Admit: 2021-02-01 | Discharge: 2021-02-01 | Disposition: A | Discharge status: Home / Self Care | Payer: Medicare Other | Source: Ambulatory Visit | Attending: Internal Medicine | Admitting: Internal Medicine

## 2021-02-01 DIAGNOSIS — Z1231 Encounter for screening mammogram for malignant neoplasm of breast: Secondary | ICD-10-CM

## 2021-04-10 ENCOUNTER — Other Ambulatory Visit: Payer: Self-pay | Admitting: Medical Oncology

## 2021-04-10 ENCOUNTER — Telehealth: Payer: Self-pay | Admitting: Internal Medicine

## 2021-04-10 ENCOUNTER — Other Ambulatory Visit: Payer: Medicare Other

## 2021-04-10 DIAGNOSIS — C9000 Multiple myeloma not having achieved remission: Secondary | ICD-10-CM

## 2021-04-10 NOTE — Telephone Encounter (Signed)
Sch per 1/10 inbasket, pt aware °

## 2021-04-11 ENCOUNTER — Other Ambulatory Visit: Payer: Self-pay | Admitting: Internal Medicine

## 2021-04-12 ENCOUNTER — Inpatient Hospital Stay: Payer: Medicare Other

## 2021-04-12 ENCOUNTER — Other Ambulatory Visit: Payer: Self-pay

## 2021-04-12 ENCOUNTER — Inpatient Hospital Stay: Payer: Medicare Other | Attending: Internal Medicine

## 2021-04-12 VITALS — BP 153/77 | HR 66 | Temp 98.3°F | Resp 17

## 2021-04-12 DIAGNOSIS — C9000 Multiple myeloma not having achieved remission: Secondary | ICD-10-CM

## 2021-04-12 LAB — BASIC METABOLIC PANEL
Anion gap: 7 (ref 5–15)
BUN: 11 mg/dL (ref 8–23)
CO2: 30 mmol/L (ref 22–32)
Calcium: 9.5 mg/dL (ref 8.9–10.3)
Chloride: 106 mmol/L (ref 98–111)
Creatinine, Ser: 0.81 mg/dL (ref 0.44–1.00)
GFR, Estimated: >60 mL/min (ref >60)
Glucose, Bld: 89 mg/dL (ref 70–99)
Potassium: 3.7 mmol/L (ref 3.5–5.1)
Sodium: 143 mmol/L (ref 135–145)

## 2021-04-12 MED ORDER — ZOLEDRONIC ACID 4 MG/100ML IV SOLN
4.0000 mg | Freq: Once | INTRAVENOUS | Status: AC
  Administered 2021-04-12: 4 mg via INTRAVENOUS
  Filled 2021-04-12: qty 100

## 2021-04-12 MED ORDER — SODIUM CHLORIDE 0.9 % IV SOLN: Freq: Once | INTRAVENOUS | Status: AC

## 2021-04-12 NOTE — Patient Instructions (Signed)

## 2021-05-25 DIAGNOSIS — I10 Essential (primary) hypertension: Secondary | ICD-10-CM | POA: Diagnosis not present

## 2021-06-01 DIAGNOSIS — C9000 Multiple myeloma not having achieved remission: Secondary | ICD-10-CM | POA: Diagnosis not present

## 2021-06-01 DIAGNOSIS — K219 Gastro-esophageal reflux disease without esophagitis: Secondary | ICD-10-CM | POA: Diagnosis not present

## 2021-06-01 DIAGNOSIS — R8271 Bacteriuria: Secondary | ICD-10-CM | POA: Diagnosis not present

## 2021-06-01 DIAGNOSIS — R197 Diarrhea, unspecified: Secondary | ICD-10-CM | POA: Diagnosis not present

## 2021-06-01 DIAGNOSIS — G629 Polyneuropathy, unspecified: Secondary | ICD-10-CM | POA: Diagnosis not present

## 2021-06-01 DIAGNOSIS — Z0000 Encounter for general adult medical examination without abnormal findings: Secondary | ICD-10-CM | POA: Diagnosis not present

## 2021-06-01 DIAGNOSIS — Z9481 Bone marrow transplant status: Secondary | ICD-10-CM | POA: Diagnosis not present

## 2021-06-01 DIAGNOSIS — M810 Age-related osteoporosis without current pathological fracture: Secondary | ICD-10-CM | POA: Diagnosis not present

## 2021-06-01 DIAGNOSIS — M109 Gout, unspecified: Secondary | ICD-10-CM | POA: Diagnosis not present

## 2021-06-28 DIAGNOSIS — M8589 Other specified disorders of bone density and structure, multiple sites: Secondary | ICD-10-CM | POA: Diagnosis not present

## 2021-06-28 DIAGNOSIS — M810 Age-related osteoporosis without current pathological fracture: Secondary | ICD-10-CM | POA: Diagnosis not present

## 2021-06-28 DIAGNOSIS — G629 Polyneuropathy, unspecified: Secondary | ICD-10-CM | POA: Diagnosis not present

## 2021-06-28 DIAGNOSIS — N1831 Chronic kidney disease, stage 3a: Secondary | ICD-10-CM | POA: Diagnosis not present

## 2021-06-28 DIAGNOSIS — R030 Elevated blood-pressure reading, without diagnosis of hypertension: Secondary | ICD-10-CM | POA: Diagnosis not present

## 2021-06-28 DIAGNOSIS — C9000 Multiple myeloma not having achieved remission: Secondary | ICD-10-CM | POA: Diagnosis not present

## 2021-07-05 ENCOUNTER — Inpatient Hospital Stay: Payer: Medicare Other | Attending: Internal Medicine

## 2021-07-05 ENCOUNTER — Other Ambulatory Visit: Payer: Self-pay

## 2021-07-05 DIAGNOSIS — C9000 Multiple myeloma not having achieved remission: Secondary | ICD-10-CM | POA: Insufficient documentation

## 2021-07-05 LAB — CMP (CANCER CENTER ONLY)
ALT: 11 U/L (ref 0–44)
AST: 15 U/L (ref 15–41)
Albumin: 3.9 g/dL (ref 3.5–5.0)
Alkaline Phosphatase: 52 U/L (ref 38–126)
Anion gap: 5 (ref 5–15)
BUN: 16 mg/dL (ref 8–23)
CO2: 30 mmol/L (ref 22–32)
Calcium: 9.1 mg/dL (ref 8.9–10.3)
Chloride: 108 mmol/L (ref 98–111)
Creatinine: 1.18 mg/dL — ABNORMAL HIGH (ref 0.44–1.00)
GFR, Estimated: 47 mL/min — ABNORMAL LOW (ref >60)
Glucose, Bld: 98 mg/dL (ref 70–99)
Potassium: 3.9 mmol/L (ref 3.5–5.1)
Sodium: 143 mmol/L (ref 135–145)
Total Bilirubin: 0.4 mg/dL (ref 0.3–1.2)
Total Protein: 7.2 g/dL (ref 6.5–8.1)

## 2021-07-05 LAB — CBC WITH DIFFERENTIAL (CANCER CENTER ONLY)
Abs Immature Granulocytes: 0.01 10*3/uL (ref 0.00–0.07)
Basophils Absolute: 0 10*3/uL (ref 0.0–0.1)
Basophils Relative: 0 %
Eosinophils Absolute: 0 10*3/uL (ref 0.0–0.5)
Eosinophils Relative: 1 %
HCT: 34.2 % — ABNORMAL LOW (ref 36.0–46.0)
Hemoglobin: 11.3 g/dL — ABNORMAL LOW (ref 12.0–15.0)
Immature Granulocytes: 0 %
Lymphocytes Relative: 33 %
Lymphs Abs: 1.9 10*3/uL (ref 0.7–4.0)
MCH: 31.8 pg (ref 26.0–34.0)
MCHC: 33 g/dL (ref 30.0–36.0)
MCV: 96.3 fL (ref 80.0–100.0)
Monocytes Absolute: 0.4 10*3/uL (ref 0.1–1.0)
Monocytes Relative: 7 %
Neutro Abs: 3.3 10*3/uL (ref 1.7–7.7)
Neutrophils Relative %: 59 %
Platelet Count: 201 10*3/uL (ref 150–400)
RBC: 3.55 MIL/uL — ABNORMAL LOW (ref 3.87–5.11)
RDW: 14.3 % (ref 11.5–15.5)
WBC Count: 5.7 10*3/uL (ref 4.0–10.5)
nRBC: 0 % (ref 0.0–0.2)

## 2021-07-05 LAB — LACTATE DEHYDROGENASE: LDH: 157 U/L (ref 98–192)

## 2021-07-06 LAB — IGG, IGA, IGM
IgA: 325 mg/dL (ref 64–422)
IgG (Immunoglobin G), Serum: 1326 mg/dL (ref 586–1602)
IgM (Immunoglobulin M), Srm: 44 mg/dL (ref 26–217)

## 2021-07-06 LAB — KAPPA/LAMBDA LIGHT CHAINS
Kappa free light chain: 47.4 mg/L — ABNORMAL HIGH (ref 3.3–19.4)
Kappa, lambda light chain ratio: 3.2 — ABNORMAL HIGH (ref 0.26–1.65)
Lambda free light chains: 14.8 mg/L (ref 5.7–26.3)

## 2021-07-06 LAB — BETA 2 MICROGLOBULIN, SERUM: Beta-2 Microglobulin: 1.6 mg/L (ref 0.6–2.4)

## 2021-07-12 ENCOUNTER — Inpatient Hospital Stay (HOSPITAL_BASED_OUTPATIENT_CLINIC_OR_DEPARTMENT_OTHER): Payer: Medicare Other | Admitting: Internal Medicine

## 2021-07-12 ENCOUNTER — Other Ambulatory Visit: Payer: Self-pay

## 2021-07-12 ENCOUNTER — Encounter: Payer: Self-pay | Admitting: Internal Medicine

## 2021-07-12 ENCOUNTER — Inpatient Hospital Stay: Payer: Medicare Other

## 2021-07-12 VITALS — BP 151/84 | HR 76 | Temp 97.8°F | Resp 19 | Ht 65.0 in | Wt 155.4 lb

## 2021-07-12 DIAGNOSIS — C9000 Multiple myeloma not having achieved remission: Secondary | ICD-10-CM | POA: Diagnosis not present

## 2021-07-12 MED ORDER — ZOLEDRONIC ACID 4 MG/100ML IV SOLN
4.0000 mg | Freq: Once | INTRAVENOUS | Status: AC
  Administered 2021-07-12: 4 mg via INTRAVENOUS
  Filled 2021-07-12: qty 100

## 2021-07-12 MED ORDER — SODIUM CHLORIDE 0.9 % IV SOLN: Freq: Once | INTRAVENOUS | Status: AC

## 2021-07-12 NOTE — Progress Notes (Signed)
?    [facility] ?[phone]   [phone] ? ?OFFICE PROGRESS NOTE ? ?Carmen Pretty, MD ?[address] ?[place] [address] ? ?DIAGNOSIS: Multiple myeloma, Light chain diagnosed in August 2010 ? ?PRIOR THERAPY: ?1) status post systemic chemotherapy with Velcade, Doxil and Decadron between 04/24/2009 through 07/27/2009 discontinued secondary to peripheral neuropathy. ?2) status post treatment with Revlimid and Decadron between 09/01/2009 through December 2011. ?3) status post peripheral blood autologous stem cell transplant on 04/26/2010 at [facility] under the care of Dr. Miki Kins. ?4) maintenance chemotherapy with Revlimid 10 mg by mouth daily started June 2012. This will be changed to 5 mg by mouth daily starting next week secondary to persistent diarrhea. ? ?CURRENT THERAPY: ?1) Zometa 4 mg IV every 3 months. ? ?INTERVAL HISTORY: ?Carmen Fisher 81 y.o. female returns to the clinic today for 60-monthfollow-up visit.  The patient is feeling fine today with no concerning complaints except for some aching pain in her wrist area with mild neuropathy.  She has no chest pain, shortness of breath, cough or hemoptysis.  She has no nausea, vomiting but continues to have few episodes of diarrhea with no constipation or abdominal pain.  She has no headache or visual changes.  She is currently on observation but she receives Zometa every 3 months.  The patient is here today for evaluation and repeat myeloma panel. ? ?MEDICAL HISTORY: ?Past Medical History:  ?Diagnosis Date  ? Dyslipidemia   ? GERD (gastroesophageal reflux disease)   ? Gout   ? 09-05-2017  per pt stable,  last flare-up long time ago  ? [place]   @ [facility]  ? History of acute pyelonephritis 08/16/2017  ? History of chemotherapy 04-24-2009  to 07-27-2009  ? systemic  ? History of sepsis   ? 09/ 2014 secondary to UTI/  08-16-2017  SIRS, severe sepsis due to  acute pyelonephritis with kidney stone obstruction  ? Hypokalemia   ? Intermittent diarrhea   ? Multiple myeloma in remission ([facility] oncologist-  [facility] ? dx 08/ 2010-- Kappa Light Chain, completed chemo 07-27-2009 , s/p  high dose melphalan and autologous stem cell transplant 04-26-2010 @ [facility],  maintenance oral chemo (revlimid) started 06/ 2012--stopped 03/ 2019 due to neutropenia and diarrhea,  Zometa IV every 3 months  ? Osteoarthritis   ? Peripheral neuropathy due to chemotherapy ([facility]   ? Renal calculus, right   ? Right ureteral calculus   ? Wears dentures   ? upper  ? Wears glasses   ? ? ?ALLERGIES:  is allergic to aspirin, ampicillin, sulfa antibiotics, and penicillins. ? ?MEDICATIONS:  ?Current Outpatient Medications  ?Medication Sig Dispense Refill  ? acetaminophen (TYLENOL) 500 MG tablet Take 500 mg by mouth every 6 (six) hours as needed.    ? allopurinol (ZYLOPRIM) 300 MG tablet Take 1 tablet by mouth daily.    ? cholecalciferol (VITAMIN D) 1000 UNITS tablet Take 1,000 Units by mouth daily.    ? diphenoxylate-atropine (LOMOTIL) 2.5-0.025 MG tablet 1 tablet as needed    ? dorzolamide-timolol (COSOPT) 22.3-6.8 MG/ML ophthalmic solution Place 1 drop into both eyes 2 (two) times daily.     ? gabapentin (NEURONTIN) 600 MG tablet Take 1 tablet (600 mg total) by mouth 2 (two) times daily. 60 tablet 0  ? HYDROcodone-acetaminophen (NORCO) 10-325 MG tablet Take 1-2 tablets by mouth every 4 (four) hours  as needed for moderate pain. Maximum dose per 24 hours - 8 pills 14 tablet 0  ? latanoprost (XALATAN) 0.005 % ophthalmic solution Place 1 drop into both eyes at bedtime.     ? LORazepam (ATIVAN) 1 MG tablet Take 1 mg by mouth every 4 (four) hours as needed (neuropathy). Reported on 09/14/2015    ? Multiple Vitamin (MULTIVITAMIN WITH MINERALS) TABS tablet Take 1 tablet by mouth daily.    ? pantoprazole (PROTONIX) 40 MG tablet Take 1 tablet (40 mg total) by mouth  daily. (Patient taking differently: Take 20 mg by mouth daily. Takes 1/2 tablet) 90 tablet 0  ? potassium chloride SA (KLOR-CON M20) 20 MEQ tablet TAKE 1 AND 1/2 TABLETS     DAILY.--- TAKE 1 TABLET IN THE MORNING AND 1/2 TABLET IN THE EVENING 135 tablet 0  ? ?No current facility-administered medications for this visit.  ? ? ?SURGICAL HISTORY:  ?Past Surgical History:  ?Procedure Laterality Date  ? APPENDECTOMY  1990s  ? CARPAL TUNNEL RELEASE Left 10-14-2001   [facility]  ? AND A-1 PULLEY RELEASE LEFT RING FINGER  ? CYSTOSCOPY W/ URETERAL STENT PLACEMENT Right 08/16/2017  ? Procedure: CYSTOSCOPY WITH RETROGRADE PYELOGRAM/URETERAL STENT PLACEMENT;  Surgeon: Ardis Hughs, MD;  Location: [facility];  Service: Urology;  Laterality: Right;  ? CYSTOSCOPY/RETROGRADE/URETEROSCOPY Right 09/08/2017  ? Procedure: CYSTOSCOPY/RETROGRADE/URETEROSCOPY/STONE EXTRACTION/ STENT EXCHANGE, STONE BASKET RETRIVAL, LASER;  Surgeon: Kathie Rhodes, MD;  Location: [facility];  Service: Urology;  Laterality: Right;  ? VAGINAL HYSTERECTOMY  1994  ? W/  BSO  ? ? ?REVIEW OF SYSTEMS:  A comprehensive review of systems was negative except for: Gastrointestinal: positive for diarrhea ?Musculoskeletal: positive for arthralgias  ? ?PHYSICAL EXAMINATION: General appearance: alert, cooperative, and no distress ?Head: Normocephalic, without obvious abnormality, atraumatic ?Neck: no adenopathy, no JVD, supple, symmetrical, trachea midline, and thyroid not enlarged, symmetric, no tenderness/mass/nodules ?Lymph nodes: Cervical, supraclavicular, and axillary nodes normal. ?Resp: clear to auscultation bilaterally ?Back: symmetric, no curvature. ROM normal. No CVA tenderness. ?Cardio: regular rate and rhythm, S1, S2 normal, no murmur, click, rub or gallop ?GI: soft, non-tender; bowel sounds normal; no masses,  no organomegaly ?Extremities: extremities normal, atraumatic, no cyanosis or edema ? ?ECOG PERFORMANCE STATUS: 1 - Symptomatic but completely  ambulatory ? ?Blood pressure (!) 151/84, pulse 76, temperature 97.8 ?F (36.6 ?C), temperature source Tympanic, resp. rate 19, height 5' 5"  (1.651 m), weight 155 lb 6.4 oz (70.5 kg), SpO2 99 %. ? ?LABORATORY DATA: ?Lab Results  ?Component Value Date  ? WBC 5.7 07/05/2021  ? HGB 11.3 (L) 07/05/2021  ? HCT 34.2 (L) 07/05/2021  ? MCV 96.3 07/05/2021  ? PLT 201 07/05/2021  ? ? ?  Chemistry   ?   ?Component Value Date/Time  ? NA 143 07/05/2021 1043  ? NA 141 03/06/2017 1024  ? K 3.9 07/05/2021 1043  ? K 3.9 03/06/2017 1024  ? CL 108 07/05/2021 1043  ? CL 106 07/28/2012 0924  ? CO2 30 07/05/2021 1043  ? CO2 25 03/06/2017 1024  ? BUN 16 07/05/2021 1043  ? BUN 14.3 03/06/2017 1024  ? CREATININE 1.18 (H) 07/05/2021 1043  ? CREATININE 1.0 03/06/2017 1024  ?    ?Component Value Date/Time  ? CALCIUM 9.1 07/05/2021 1043  ? CALCIUM 9.8 03/06/2017 1024  ? ALKPHOS 52 07/05/2021 1043  ? ALKPHOS 74 03/06/2017 1024  ? AST 15 07/05/2021 1043  ? AST 16 03/06/2017 1024  ? ALT 11 07/05/2021 1043  ? ALT 15  03/06/2017 1024  ? BILITOT 0.4 07/05/2021 1043  ? BILITOT 0.57 03/06/2017 1024  ?  ? ? ? ?RADIOGRAPHIC STUDIES: ?No results found. ? ?ASSESSMENT AND PLAN:  ?This is a very pleasant 81 years old African-American female with multiple myeloma and has been on maintenance Revlimid currently 5 mg by mouth daily and has been on this treatment for almost 6 years. ?Her treatment was discontinued secondary to neutropenia and persistent diarrhea.   ?The patient is currently on observation and she is feeling fine with no concerning complaints except for the persistent few episodes of diarrhea as well as arthralgia in the hands. ?She had repeat myeloma panel performed recently.  I discussed the lab results with the patient and there is no clear evidence for progression. ?I recommended for the patient to continue on observation with repeat myeloma panel in 6 months. ?For the bone disease, she will continue receiving her Zometa every 3 months. ?The  patient was advised to call immediately if she has any other concerning symptoms in the interval. ?The patient voices understanding of current disease status and treatment options and is in agreement with the curr

## 2021-07-12 NOTE — Patient Instructions (Signed)

## 2021-08-10 DIAGNOSIS — H401112 Primary open-angle glaucoma, right eye, moderate stage: Secondary | ICD-10-CM | POA: Diagnosis not present

## 2021-09-13 DIAGNOSIS — H401122 Primary open-angle glaucoma, left eye, moderate stage: Secondary | ICD-10-CM | POA: Diagnosis not present

## 2021-10-11 ENCOUNTER — Inpatient Hospital Stay: Payer: Medicare Other | Attending: Internal Medicine

## 2021-10-11 ENCOUNTER — Inpatient Hospital Stay: Payer: Medicare Other

## 2021-10-11 ENCOUNTER — Other Ambulatory Visit: Payer: Self-pay

## 2021-10-11 VITALS — BP 155/82 | HR 70 | Temp 98.5°F | Resp 17 | Wt 155.2 lb

## 2021-10-11 DIAGNOSIS — C9000 Multiple myeloma not having achieved remission: Secondary | ICD-10-CM | POA: Diagnosis not present

## 2021-10-11 LAB — CMP (CANCER CENTER ONLY)
ALT: 11 U/L (ref 0–44)
AST: 17 U/L (ref 15–41)
Albumin: 4.1 g/dL (ref 3.5–5.0)
Alkaline Phosphatase: 49 U/L (ref 38–126)
Anion gap: 7 (ref 5–15)
BUN: 17 mg/dL (ref 8–23)
CO2: 29 mmol/L (ref 22–32)
Calcium: 10.1 mg/dL (ref 8.9–10.3)
Chloride: 105 mmol/L (ref 98–111)
Creatinine: 0.93 mg/dL (ref 0.44–1.00)
GFR, Estimated: >60 mL/min (ref >60)
Glucose, Bld: 87 mg/dL (ref 70–99)
Potassium: 3.4 mmol/L — ABNORMAL LOW (ref 3.5–5.1)
Sodium: 141 mmol/L (ref 135–145)
Total Bilirubin: 0.5 mg/dL (ref 0.3–1.2)
Total Protein: 7.4 g/dL (ref 6.5–8.1)

## 2021-10-11 LAB — CBC WITH DIFFERENTIAL (CANCER CENTER ONLY)
Abs Immature Granulocytes: 0.03 10*3/uL (ref 0.00–0.07)
Basophils Absolute: 0 10*3/uL (ref 0.0–0.1)
Basophils Relative: 0 %
Eosinophils Absolute: 0.1 10*3/uL (ref 0.0–0.5)
Eosinophils Relative: 1 %
HCT: 33.6 % — ABNORMAL LOW (ref 36.0–46.0)
Hemoglobin: 11.1 g/dL — ABNORMAL LOW (ref 12.0–15.0)
Immature Granulocytes: 0 %
Lymphocytes Relative: 30 %
Lymphs Abs: 2.5 10*3/uL (ref 0.7–4.0)
MCH: 31.8 pg (ref 26.0–34.0)
MCHC: 33 g/dL (ref 30.0–36.0)
MCV: 96.3 fL (ref 80.0–100.0)
Monocytes Absolute: 0.7 10*3/uL (ref 0.1–1.0)
Monocytes Relative: 8 %
Neutro Abs: 5 10*3/uL (ref 1.7–7.7)
Neutrophils Relative %: 61 %
Platelet Count: 196 10*3/uL (ref 150–400)
RBC: 3.49 MIL/uL — ABNORMAL LOW (ref 3.87–5.11)
RDW: 14.2 % (ref 11.5–15.5)
WBC Count: 8.3 10*3/uL (ref 4.0–10.5)
nRBC: 0 % (ref 0.0–0.2)

## 2021-10-11 LAB — LACTATE DEHYDROGENASE: LDH: 187 U/L (ref 98–192)

## 2021-10-11 MED ORDER — SODIUM CHLORIDE 0.9 % IV SOLN: INTRAVENOUS | Status: DC

## 2021-10-11 MED ORDER — ZOLEDRONIC ACID 4 MG/100ML IV SOLN
4.0000 mg | Freq: Once | INTRAVENOUS | Status: AC
  Administered 2021-10-11: 4 mg via INTRAVENOUS
  Filled 2021-10-11: qty 100

## 2021-10-11 NOTE — Patient Instructions (Signed)
[facility] ONCOLOGY  Discharge Instructions: Thank you for choosing [facility] to provide your oncology and hematology care.   If you have a lab appointment with the [facility], please go directly to the [facility] and check in at the registration area.   Wear comfortable clothing and clothing appropriate for easy access to any Portacath or PICC line.   We strive to give you quality time with your provider. You may need to reschedule your appointment if you arrive late (15 or more minutes).  Arriving late affects you and other patients whose appointments are after yours.  Also, if you miss three or more appointments without notifying the office, you may be dismissed from the clinic at the provider's discretion.      For prescription refill requests, have your pharmacy contact our office and allow 72 hours for refills to be completed.    Today you received the following chemotherapy and/or immunotherapy agent: Zometa      To help prevent nausea and vomiting after your treatment, we encourage you to take your nausea medication as directed.  BELOW ARE SYMPTOMS THAT SHOULD BE REPORTED IMMEDIATELY: *FEVER GREATER THAN 100.4 F (38 C) OR HIGHER *CHILLS OR SWEATING *NAUSEA AND VOMITING THAT IS NOT CONTROLLED WITH YOUR NAUSEA MEDICATION *UNUSUAL SHORTNESS OF BREATH *UNUSUAL BRUISING OR BLEEDING *URINARY PROBLEMS (pain or burning when urinating, or frequent urination) *BOWEL PROBLEMS (unusual diarrhea, constipation, pain near the anus) TENDERNESS IN MOUTH AND THROAT WITH OR WITHOUT PRESENCE OF ULCERS (sore throat, sores in mouth, or a toothache) UNUSUAL RASH, SWELLING OR PAIN  UNUSUAL VAGINAL DISCHARGE OR ITCHING   Items with * indicate a potential emergency and should be followed up as soon as possible or go to the Emergency Department if any problems should occur.  Please show the CHEMOTHERAPY ALERT CARD or IMMUNOTHERAPY ALERT CARD at check-in to the  Emergency Department and triage nurse.  Should you have questions after your visit or need to cancel or reschedule your appointment, please contact [facility]  Dept: [phone]  and follow the prompts.  Office hours are 8:00 a.m. to 4:30 p.m. Monday - Friday. Please note that voicemails left after 4:00 p.m. may not be returned until the following business day.  We are closed weekends and major holidays. You have access to a nurse at all times for urgent questions. Please call the main number to the clinic Dept: [phone] and follow the prompts.   For any non-urgent questions, you may also contact your provider using MyChart. We now offer e-Visits for anyone 32 and older to request care online for non-urgent symptoms. For details visit [email or web address].   Also download the MyChart app! Go to the app store, search "MyChart", open the app, select [facility], and log in with your MyChart username and password.  Masks are optional in the cancer centers. If you would like for your care team to wear a mask while they are taking care of you, please let them know. For doctor visits, patients may have with them one support person who is at least 79 years old. At this time, visitors are not allowed in the infusion area.

## 2021-10-12 LAB — IGG, IGA, IGM
IgA: 340 mg/dL (ref 64–422)
IgG (Immunoglobin G), Serum: 1495 mg/dL (ref 586–1602)
IgM (Immunoglobulin M), Srm: 52 mg/dL (ref 26–217)

## 2021-10-12 LAB — BETA 2 MICROGLOBULIN, SERUM: Beta-2 Microglobulin: 1.6 mg/L (ref 0.6–2.4)

## 2021-10-12 LAB — KAPPA/LAMBDA LIGHT CHAINS
Kappa free light chain: 49.1 mg/L — ABNORMAL HIGH (ref 3.3–19.4)
Kappa, lambda light chain ratio: 3.25 — ABNORMAL HIGH (ref 0.26–1.65)
Lambda free light chains: 15.1 mg/L (ref 5.7–26.3)

## 2021-10-17 DIAGNOSIS — H401122 Primary open-angle glaucoma, left eye, moderate stage: Secondary | ICD-10-CM | POA: Diagnosis not present

## 2022-01-08 ENCOUNTER — Other Ambulatory Visit: Payer: Self-pay

## 2022-01-08 ENCOUNTER — Other Ambulatory Visit: Payer: Self-pay | Admitting: Medical Oncology

## 2022-01-08 ENCOUNTER — Inpatient Hospital Stay: Payer: Medicare Other | Attending: Internal Medicine

## 2022-01-08 DIAGNOSIS — C9000 Multiple myeloma not having achieved remission: Secondary | ICD-10-CM

## 2022-01-08 LAB — CBC WITH DIFFERENTIAL (CANCER CENTER ONLY)
Abs Immature Granulocytes: 0.02 10*3/uL (ref 0.00–0.07)
Basophils Absolute: 0 10*3/uL (ref 0.0–0.1)
Basophils Relative: 0 %
Eosinophils Absolute: 0.1 10*3/uL (ref 0.0–0.5)
Eosinophils Relative: 1 %
HCT: 35.4 % — ABNORMAL LOW (ref 36.0–46.0)
Hemoglobin: 11.7 g/dL — ABNORMAL LOW (ref 12.0–15.0)
Immature Granulocytes: 0 %
Lymphocytes Relative: 31 %
Lymphs Abs: 2.2 10*3/uL (ref 0.7–4.0)
MCH: 31.8 pg (ref 26.0–34.0)
MCHC: 33.1 g/dL (ref 30.0–36.0)
MCV: 96.2 fL (ref 80.0–100.0)
Monocytes Absolute: 0.6 10*3/uL (ref 0.1–1.0)
Monocytes Relative: 8 %
Neutro Abs: 4.4 10*3/uL (ref 1.7–7.7)
Neutrophils Relative %: 60 %
Platelet Count: 200 10*3/uL (ref 150–400)
RBC: 3.68 MIL/uL — ABNORMAL LOW (ref 3.87–5.11)
RDW: 14.1 % (ref 11.5–15.5)
WBC Count: 7.3 10*3/uL (ref 4.0–10.5)
nRBC: 0 % (ref 0.0–0.2)

## 2022-01-08 LAB — CMP (CANCER CENTER ONLY)
ALT: 13 U/L (ref 0–44)
AST: 18 U/L (ref 15–41)
Albumin: 4.1 g/dL (ref 3.5–5.0)
Alkaline Phosphatase: 52 U/L (ref 38–126)
Anion gap: 4 — ABNORMAL LOW (ref 5–15)
BUN: 14 mg/dL (ref 8–23)
CO2: 33 mmol/L — ABNORMAL HIGH (ref 22–32)
Calcium: 9.3 mg/dL (ref 8.9–10.3)
Chloride: 106 mmol/L (ref 98–111)
Creatinine: 0.93 mg/dL (ref 0.44–1.00)
GFR, Estimated: >60 mL/min (ref >60)
Glucose, Bld: 110 mg/dL — ABNORMAL HIGH (ref 70–99)
Potassium: 3.7 mmol/L (ref 3.5–5.1)
Sodium: 143 mmol/L (ref 135–145)
Total Bilirubin: 0.5 mg/dL (ref 0.3–1.2)
Total Protein: 7.5 g/dL (ref 6.5–8.1)

## 2022-01-09 ENCOUNTER — Other Ambulatory Visit: Payer: Self-pay | Admitting: Internal Medicine

## 2022-01-09 DIAGNOSIS — Z1231 Encounter for screening mammogram for malignant neoplasm of breast: Secondary | ICD-10-CM

## 2022-01-15 ENCOUNTER — Inpatient Hospital Stay: Payer: Medicare Other

## 2022-01-15 ENCOUNTER — Other Ambulatory Visit: Payer: Self-pay

## 2022-01-15 ENCOUNTER — Inpatient Hospital Stay (HOSPITAL_BASED_OUTPATIENT_CLINIC_OR_DEPARTMENT_OTHER): Payer: Medicare Other | Admitting: Internal Medicine

## 2022-01-15 VITALS — BP 174/102 | HR 71 | Temp 98.0°F | Resp 15 | Wt 156.4 lb

## 2022-01-15 VITALS — BP 163/90 | HR 68 | Temp 98.6°F | Resp 16

## 2022-01-15 DIAGNOSIS — C9000 Multiple myeloma not having achieved remission: Secondary | ICD-10-CM

## 2022-01-15 MED ORDER — SODIUM CHLORIDE 0.9 % IV SOLN: INTRAVENOUS | Status: DC

## 2022-01-15 MED ORDER — ZOLEDRONIC ACID 4 MG/100ML IV SOLN
4.0000 mg | Freq: Once | INTRAVENOUS | Status: AC
  Administered 2022-01-15: 4 mg via INTRAVENOUS
  Filled 2022-01-15: qty 100

## 2022-01-15 NOTE — Progress Notes (Signed)
[facility] [phone]   Fax:(336) [place], [place] [address] [place] [address]  DIAGNOSIS: Multiple myeloma, Light chain diagnosed in August 2010  PRIOR THERAPY: 1) status post systemic chemotherapy with Velcade, Doxil and Decadron between 04/24/2009 through 07/27/2009 discontinued secondary to peripheral neuropathy. 2) status post treatment with Revlimid and Decadron between 09/01/2009 through December 2011. 3) status post peripheral blood autologous stem cell transplant on 04/26/2010 at [facility] under the care of Dr. Miki Kins. 4) maintenance chemotherapy with Revlimid 10 mg by mouth daily started June 2012. This will be changed to 5 mg by mouth daily starting next week secondary to persistent diarrhea.  CURRENT THERAPY: 1) Zometa 4 mg IV every 3 months.  INTERVAL HISTORY: Carmen Fisher 81 y.o. female returns to the clinic today for follow-up visit.  The patient is feeling fine today with no concerning complaints except for occasional dizzy spells.  She denied having any current chest pain, shortness of breath, cough or hemoptysis.  She has no nausea, vomiting, diarrhea or constipation.  She has no headache or visual changes.  She denied having any significant weight loss or night sweats.  She is here today for evaluation and repeat blood work before her Zometa infusion.  MEDICAL HISTORY: Past Medical History:  Diagnosis Date   Dyslipidemia    GERD (gastroesophageal reflux disease)    Gout    09-05-2017  per pt stable,  last flare-up long time ago   H/O autologous stem cell transplant ([place] [facility] [place]) 04-26-2010   @ [facility]   History of acute pyelonephritis 08/16/2017   History of chemotherapy 04-24-2009  to 07-27-2009   systemic   History of sepsis    09/ 2014 secondary to UTI/  08-16-2017  SIRS, severe sepsis due to acute pyelonephritis with kidney stone obstruction   Hypokalemia     Intermittent diarrhea    Multiple myeloma in remission [facility]) oncologist-  dr Julien Nordmann ([facility] ) and dr Miki Kins at [facility]   dx 08/ 2010-- Kappa Light Chain, completed chemo 07-27-2009 , s/p  high dose melphalan and autologous stem cell transplant 04-26-2010 @ [facility],  maintenance oral chemo (revlimid) started 06/ 2012--stopped 03/ 2019 due to neutropenia and diarrhea,  Zometa IV every 3 months   Osteoarthritis    Peripheral neuropathy due to chemotherapy [facility])    Renal calculus, right    Right ureteral calculus    Wears dentures    upper   Wears glasses     ALLERGIES:  is allergic to aspirin, ampicillin, sulfa antibiotics, and penicillins.  MEDICATIONS:  Current Outpatient Medications  Medication Sig Dispense Refill   allopurinol (ZYLOPRIM) 300 MG tablet Take 1 tablet by mouth daily.     cholecalciferol (VITAMIN D) 1000 UNITS tablet Take 1,000 Units by mouth daily.     diphenoxylate-atropine (LOMOTIL) 2.5-0.025 MG tablet 1 tablet as needed     dorzolamide-timolol (COSOPT) 22.3-6.8 MG/ML ophthalmic solution Place 1 drop into both eyes 2 (two) times daily.      gabapentin (NEURONTIN) 600 MG tablet Take 1 tablet (600 mg total) by mouth 2 (two) times daily. 60 tablet 0   latanoprost (XALATAN) 0.005 % ophthalmic solution Place 1 drop into both eyes at bedtime.      LORazepam (ATIVAN) 1 MG tablet Take 1 mg by mouth every 4 (four) hours as needed (neuropathy). Reported on 09/14/2015     Multiple Vitamin (MULTIVITAMIN WITH MINERALS) TABS tablet  Take 1 tablet by mouth daily.     pantoprazole (PROTONIX) 40 MG tablet Take 1 tablet (40 mg total) by mouth daily. (Patient taking differently: Take 20 mg by mouth daily. Takes 1/2 tablet) 90 tablet 0   No current facility-administered medications for this visit.    SURGICAL HISTORY:  Past Surgical History:  Procedure Laterality Date   APPENDECTOMY  1990s   CARPAL TUNNEL RELEASE Left 10-14-2001   [facility]   AND A-1 PULLEY RELEASE LEFT RING FINGER    CYSTOSCOPY W/ URETERAL STENT PLACEMENT Right 08/16/2017   Procedure: CYSTOSCOPY WITH RETROGRADE PYELOGRAM/URETERAL STENT PLACEMENT;  Surgeon: Ardis Hughs, MD;  Location: [facility];  Service: Urology;  Laterality: Right;   CYSTOSCOPY/RETROGRADE/URETEROSCOPY Right 09/08/2017   Procedure: CYSTOSCOPY/RETROGRADE/URETEROSCOPY/STONE EXTRACTION/ STENT EXCHANGE, STONE BASKET RETRIVAL, LASER;  Surgeon: Kathie Rhodes, MD;  Location: [facility];  Service: Urology;  Laterality: Right;   VAGINAL HYSTERECTOMY  1994   W/  BSO    REVIEW OF SYSTEMS:  A comprehensive review of systems was negative except for: Neurological: positive for dizziness   PHYSICAL EXAMINATION: General appearance: alert, cooperative, and no distress Head: Normocephalic, without obvious abnormality, atraumatic Neck: no adenopathy, no JVD, supple, symmetrical, trachea midline, and thyroid not enlarged, symmetric, no tenderness/mass/nodules Lymph nodes: Cervical, supraclavicular, and axillary nodes normal. Resp: clear to auscultation bilaterally Back: symmetric, no curvature. ROM normal. No CVA tenderness. Cardio: regular rate and rhythm, S1, S2 normal, no murmur, click, rub or gallop GI: soft, non-tender; bowel sounds normal; no masses,  no organomegaly Extremities: extremities normal, atraumatic, no cyanosis or edema  ECOG PERFORMANCE STATUS: 1 - Symptomatic but completely ambulatory  Blood pressure (!) 174/102, pulse 71, temperature 98 F (36.7 C), temperature source Oral, resp. rate 15, weight 156 lb 6.4 oz (70.9 kg), SpO2 98 %.  LABORATORY DATA: Lab Results  Component Value Date   WBC 7.3 01/08/2022   HGB 11.7 (L) 01/08/2022   HCT 35.4 (L) 01/08/2022   MCV 96.2 01/08/2022   PLT 200 01/08/2022      Chemistry      Component Value Date/Time   NA 143 01/08/2022 1029   NA 141 03/06/2017 1024   K 3.7 01/08/2022 1029   K 3.9 03/06/2017 1024   CL 106 01/08/2022 1029   CL 106 07/28/2012 0924   CO2 33  (H) 01/08/2022 1029   CO2 25 03/06/2017 1024   BUN 14 01/08/2022 1029   BUN 14.3 03/06/2017 1024   CREATININE 0.93 01/08/2022 1029   CREATININE 1.0 03/06/2017 1024      Component Value Date/Time   CALCIUM 9.3 01/08/2022 1029   CALCIUM 9.8 03/06/2017 1024   ALKPHOS 52 01/08/2022 1029   ALKPHOS 74 03/06/2017 1024   AST 18 01/08/2022 1029   AST 16 03/06/2017 1024   ALT 13 01/08/2022 1029   ALT 15 03/06/2017 1024   BILITOT 0.5 01/08/2022 1029   BILITOT 0.57 03/06/2017 1024       RADIOGRAPHIC STUDIES: No results found.  ASSESSMENT AND PLAN:  This is a very pleasant 81 years old African-American female with multiple myeloma and has been on maintenance Revlimid currently 5 mg by mouth daily and has been on this treatment for almost 6 years. Her treatment was discontinued secondary to neutropenia and persistent diarrhea.   The patient is currently on observation and she is feeling fine with no concerning complaints except for occasional dizzy spells. I recommended for her to proceed with her Zometa infusion today as planned. I will  see her back for follow-up visit in 3 months for evaluation with repeat myeloma panel. The patient was advised to call immediately if she has any other concerning symptoms in the interval. The patient voices understanding of current disease status and treatment options and is in agreement with the current care plan.  All questions were answered. The patient knows to call the clinic with any problems, questions or concerns. We can certainly see the patient much sooner if necessary.  Disclaimer: This note was dictated with voice recognition software. Similar sounding words can inadvertently be transcribed and may not be corrected upon review.

## 2022-01-15 NOTE — Patient Instructions (Signed)

## 2022-01-15 NOTE — Progress Notes (Signed)
No recent dental procedures- CA 9.3, Scr 0.93- labs to be used from 01/08/22 per Dr. Julien Nordmann.

## 2022-02-15 ENCOUNTER — Ambulatory Visit
Admission: RE | Admit: 2022-02-15 | Discharge: 2022-02-15 | Disposition: A | Discharge status: Home / Self Care | Payer: Medicare Other | Source: Ambulatory Visit | Attending: Internal Medicine | Admitting: Internal Medicine

## 2022-02-15 DIAGNOSIS — Z1231 Encounter for screening mammogram for malignant neoplasm of breast: Secondary | ICD-10-CM | POA: Diagnosis not present

## 2022-03-26 DIAGNOSIS — Z23 Encounter for immunization: Secondary | ICD-10-CM | POA: Diagnosis not present

## 2022-03-26 DIAGNOSIS — M791 Myalgia, unspecified site: Secondary | ICD-10-CM | POA: Diagnosis not present

## 2022-04-03 ENCOUNTER — Encounter: Payer: Self-pay | Admitting: Internal Medicine

## 2022-04-10 ENCOUNTER — Other Ambulatory Visit: Payer: Self-pay

## 2022-04-10 ENCOUNTER — Inpatient Hospital Stay: Payer: Medicare Other | Attending: Internal Medicine

## 2022-04-10 DIAGNOSIS — C9000 Multiple myeloma not having achieved remission: Secondary | ICD-10-CM | POA: Diagnosis not present

## 2022-04-10 LAB — CMP (CANCER CENTER ONLY)
ALT: 13 U/L (ref 0–44)
AST: 16 U/L (ref 15–41)
Albumin: 4.3 g/dL (ref 3.5–5.0)
Alkaline Phosphatase: 48 U/L (ref 38–126)
Anion gap: 4 — ABNORMAL LOW (ref 5–15)
BUN: 14 mg/dL (ref 8–23)
CO2: 32 mmol/L (ref 22–32)
Calcium: 10.2 mg/dL (ref 8.9–10.3)
Chloride: 104 mmol/L (ref 98–111)
Creatinine: 0.87 mg/dL (ref 0.44–1.00)
GFR, Estimated: >60 mL/min (ref >60)
Glucose, Bld: 88 mg/dL (ref 70–99)
Potassium: 3.7 mmol/L (ref 3.5–5.1)
Sodium: 140 mmol/L (ref 135–145)
Total Bilirubin: 0.4 mg/dL (ref 0.3–1.2)
Total Protein: 7.6 g/dL (ref 6.5–8.1)

## 2022-04-10 LAB — CBC WITH DIFFERENTIAL/PLATELET
Abs Immature Granulocytes: 0.02 10*3/uL (ref 0.00–0.07)
Basophils Absolute: 0 10*3/uL (ref 0.0–0.1)
Basophils Relative: 1 %
Eosinophils Absolute: 0.1 10*3/uL (ref 0.0–0.5)
Eosinophils Relative: 1 %
HCT: 35.7 % — ABNORMAL LOW (ref 36.0–46.0)
Hemoglobin: 11.5 g/dL — ABNORMAL LOW (ref 12.0–15.0)
Immature Granulocytes: 0 %
Lymphocytes Relative: 38 %
Lymphs Abs: 2.9 10*3/uL (ref 0.7–4.0)
MCH: 31.2 pg (ref 26.0–34.0)
MCHC: 32.2 g/dL (ref 30.0–36.0)
MCV: 96.7 fL (ref 80.0–100.0)
Monocytes Absolute: 0.6 10*3/uL (ref 0.1–1.0)
Monocytes Relative: 8 %
Neutro Abs: 4 10*3/uL (ref 1.7–7.7)
Neutrophils Relative %: 52 %
Platelets: 233 10*3/uL (ref 150–400)
RBC: 3.69 MIL/uL — ABNORMAL LOW (ref 3.87–5.11)
RDW: 13.9 % (ref 11.5–15.5)
WBC: 7.7 10*3/uL (ref 4.0–10.5)
nRBC: 0 % (ref 0.0–0.2)

## 2022-04-10 LAB — LACTATE DEHYDROGENASE: LDH: 173 U/L (ref 98–192)

## 2022-04-11 LAB — KAPPA/LAMBDA LIGHT CHAINS
Kappa free light chain: 56.4 mg/L — ABNORMAL HIGH (ref 3.3–19.4)
Kappa, lambda light chain ratio: 3.71 — ABNORMAL HIGH (ref 0.26–1.65)
Lambda free light chains: 15.2 mg/L (ref 5.7–26.3)

## 2022-04-12 LAB — IGG, IGA, IGM
IgA: 344 mg/dL (ref 64–422)
IgG (Immunoglobin G), Serum: 1481 mg/dL (ref 586–1602)
IgM (Immunoglobulin M), Srm: 53 mg/dL (ref 26–217)

## 2022-04-12 LAB — BETA 2 MICROGLOBULIN, SERUM: Beta-2 Microglobulin: 1.5 mg/L (ref 0.6–2.4)

## 2022-04-17 ENCOUNTER — Inpatient Hospital Stay: Payer: Medicare Other | Admitting: Internal Medicine

## 2022-04-17 ENCOUNTER — Inpatient Hospital Stay: Payer: Medicare Other

## 2022-04-17 ENCOUNTER — Other Ambulatory Visit: Payer: Self-pay

## 2022-04-17 VITALS — BP 158/79 | HR 77 | Temp 98.2°F | Resp 17 | Wt 153.1 lb

## 2022-04-17 DIAGNOSIS — C9000 Multiple myeloma not having achieved remission: Secondary | ICD-10-CM

## 2022-04-17 MED ORDER — SODIUM CHLORIDE 0.9 % IV SOLN: INTRAVENOUS | Status: DC

## 2022-04-17 MED ORDER — ZOLEDRONIC ACID 4 MG/100ML IV SOLN
4.0000 mg | Freq: Once | INTRAVENOUS | Status: AC
  Administered 2022-04-17: 4 mg via INTRAVENOUS
  Filled 2022-04-17: qty 100

## 2022-04-17 NOTE — Progress Notes (Signed)
[place] Telephone:(336) [phone]   Fax:(336) [place], MD [address] [place] [address]  DIAGNOSIS: Multiple myeloma, Light chain diagnosed in August 2010  PRIOR THERAPY: 1) status post systemic chemotherapy with Velcade, Doxil and Decadron between 04/24/2009 through 07/27/2009 discontinued secondary to peripheral neuropathy. 2) status post treatment with Revlimid and Decadron between 09/01/2009 through December 2011. 3) status post peripheral blood autologous stem cell transplant on 04/26/2010 at [facility] under the care of Dr. Miki Kins. 4) maintenance chemotherapy with Revlimid 10 mg by mouth daily started June 2012. This will be changed to 5 mg by mouth daily starting next week secondary to persistent diarrhea.  CURRENT THERAPY: 1) Zometa 4 mg IV every 3 months.  INTERVAL HISTORY: Carmen Fisher 82 y.o. female returns to the clinic today for 34-monthfollow-up visit.  The patient is feeling fine today with no concerning complaints except for occasional diarrhea.  She denied having any current chest pain, shortness of breath, cough or hemoptysis.  She has no nausea, vomiting, abdominal pain or constipation.  She has no headache or visual changes.  She has no recent weight loss or night sweats.  She is here today for evaluation with repeat myeloma panel.  MEDICAL HISTORY: Past Medical History:  Diagnosis Date   Dyslipidemia    GERD (gastroesophageal reflux disease)    Gout    09-05-2017  per pt stable,  last flare-up long time ago   [place] [facility]   History of acute pyelonephritis 08/16/2017   History of chemotherapy 04-24-2009  to 07-27-2009   systemic   History of sepsis    09/ 2014 secondary to UTI/  08-16-2017  SIRS, severe sepsis due to acute pyelonephritis with kidney stone obstruction   Hypokalemia    Intermittent diarrhea    Multiple  myeloma in remission ([facility] oncologist-  dr [facility]Julien Nord[facility] ) [facility]  [facility]-- Kappa Light Chain, completed chemo 07-27-2009 , s/p  high dose melphalan and autologous stem cell transplant 04-26-2010 @ [facility],  maintenance oral chemo (revlimid) started 06/ 2012--stopped 03/ 2019 due to neutropenia and diarrhea,  Zometa IV every 3 months   Osteoarthritis    Peripheral neuropathy due to chemotherapy ([facility]    Renal calculus, right    Right ureteral calculus    Wears dentures    upper   Wears glasses     ALLERGIES:  is allergic to aspirin, ampicillin, sulfa antibiotics, and penicillins.  MEDICATIONS:  Current Outpatient Medications  Medication Sig Dispense Refill   allopurinol (ZYLOPRIM) 300 MG tablet Take 1 tablet by mouth daily.     cholecalciferol (VITAMIN D) 1000 UNITS tablet Take 1,000 Units by mouth daily.     diphenoxylate-atropine (LOMOTIL) 2.5-0.025 MG tablet 1 tablet as needed     dorzolamide-timolol (COSOPT) 22.3-6.8 MG/ML ophthalmic solution Place 1 drop into both eyes 2 (two) times daily.      gabapentin (NEURONTIN) 600 MG tablet Take 1 tablet (600 mg total) by mouth 2 (two) times daily. 60 tablet 0   latanoprost (XALATAN) 0.005 % ophthalmic solution Place 1 drop into both eyes at bedtime.      LORazepam (ATIVAN) 1 MG tablet Take 1 mg by mouth every 4 (four) hours as needed (neuropathy). Reported on 09/14/2015     Multiple Vitamin (MULTIVITAMIN WITH MINERALS) TABS tablet Take 1 tablet by  mouth daily.     pantoprazole (PROTONIX) 40 MG tablet Take 1 tablet (40 mg total) by mouth daily. (Patient taking differently: Take 20 mg by mouth daily. Takes 1/2 tablet) 90 tablet 0   No current facility-administered medications for this visit.    SURGICAL HISTORY:  Past Surgical History:  Procedure Laterality Date   APPENDECTOMY  1990s   CARPAL TUNNEL RELEASE Left 10-14-2001   [place]   AND A-1 PULLEY RELEASE LEFT RING FINGER   CYSTOSCOPY W/ URETERAL STENT  PLACEMENT Right 08/16/2017   Procedure: CYSTOSCOPY WITH RETROGRADE PYELOGRAM/URETERAL STENT PLACEMENT;  Surgeon: Ardis Hughs, MD;  Location: [place];  Service: Urology;  Laterality: Right;   CYSTOSCOPY/RETROGRADE/URETEROSCOPY Right 09/08/2017   Procedure: CYSTOSCOPY/RETROGRADE/URETEROSCOPY/STONE EXTRACTION/ STENT EXCHANGE, STONE BASKET RETRIVAL, LASER;  Surgeon: Kathie Rhodes, MD;  Location: [place];  Service: Urology;  Laterality: Right;   VAGINAL HYSTERECTOMY  1994   W/  BSO    REVIEW OF SYSTEMS:  A comprehensive review of systems was negative except for: Constitutional: positive for fatigue Gastrointestinal: positive for diarrhea   PHYSICAL EXAMINATION: General appearance: alert, cooperative, and no distress Head: Normocephalic, without obvious abnormality, atraumatic Neck: no adenopathy, no JVD, supple, symmetrical, trachea midline, and thyroid not enlarged, symmetric, no tenderness/mass/nodules Lymph nodes: Cervical, supraclavicular, and axillary nodes normal. Resp: clear to auscultation bilaterally Back: symmetric, no curvature. ROM normal. No CVA tenderness. Cardio: regular rate and rhythm, S1, S2 normal, no murmur, click, rub or gallop GI: soft, non-tender; bowel sounds normal; no masses,  no organomegaly Extremities: extremities normal, atraumatic, no cyanosis or edema  ECOG PERFORMANCE STATUS: 1 - Symptomatic but completely ambulatory  Blood pressure (!) 158/79, pulse 77, temperature 98.2 F (36.8 C), temperature source Oral, resp. rate 17, weight 153 lb 2 oz (69.5 kg), SpO2 99 %.  LABORATORY DATA: Lab Results  Component Value Date   WBC 7.3 01/08/2022   HGB 11.7 (L) 01/08/2022   HCT 35.4 (L) 01/08/2022   MCV 96.2 01/08/2022   PLT 200 01/08/2022      Chemistry      Component Value Date/Time   NA 143 01/08/2022 1029   NA 141 03/06/2017 1024   K 3.7 01/08/2022 1029   K 3.9 03/06/2017 1024   CL 106 01/08/2022 1029   CL 106 07/28/2012 0924    CO2 33 (H) 01/08/2022 1029   CO2 25 03/06/2017 1024   BUN 14 01/08/2022 1029   BUN 14.3 03/06/2017 1024   CREATININE 0.93 01/08/2022 1029   CREATININE 1.0 03/06/2017 1024      Component Value Date/Time   CALCIUM 9.3 01/08/2022 1029   CALCIUM 9.8 03/06/2017 1024   ALKPHOS 52 01/08/2022 1029   ALKPHOS 74 03/06/2017 1024   AST 18 01/08/2022 1029   AST 16 03/06/2017 1024   ALT 13 01/08/2022 1029   ALT 15 03/06/2017 1024   BILITOT 0.5 01/08/2022 1029   BILITOT 0.57 03/06/2017 1024       RADIOGRAPHIC STUDIES: No results found.  ASSESSMENT AND PLAN:  This is a very pleasant 82 years old African-American female with multiple myeloma and has been on maintenance Revlimid currently 5 mg by mouth daily and has been on this treatment for almost 6 years. Her treatment was discontinued secondary to neutropenia and persistent diarrhea.   The patient is currently on observation and she is feeling fine with no concerning complaints except for the persistent few episodes of diarrhea. She had repeat myeloma panel performed recently.  I discussed the lab  result with the patient and there is no concerning findings for progression except for slightly increased free kappa light chain. I recommended for her to continue on observation with repeat myeloma panel in 6 months. For the bone disease, she will continue her treatment with Zometa every 3 months. The patient was advised to call immediately if she has any other concerning symptoms in the interval. The patient voices understanding of current disease status and treatment options and is in agreement with the current care plan.  All questions were answered. The patient knows to call the clinic with any problems, questions or concerns. We can certainly see the patient much sooner if necessary.  Disclaimer: This note was dictated with voice recognition software. Similar sounding words can inadvertently be transcribed and may not be corrected upon  review.

## 2022-04-17 NOTE — Patient Instructions (Signed)
Zoledronic Acid Injection (Hypercalcemia, Oncology) What is this medication? ZOLEDRONIC ACID (ZOE le dron ik AS id) slows calcium loss from bones. It high calcium levels in the blood from some kinds of cancer. It may be used in other people at risk for bone loss. This medicine may be used for other purposes; ask your health care provider or pharmacist if you have questions. COMMON BRAND NAME(S): Zometa What should I tell my care team before I take this medication? They need to know if you have any of these conditions: cancer dehydration dental disease kidney disease liver disease low levels of calcium in the blood lung or breathing disease (asthma) receiving steroids like dexamethasone or prednisone an unusual or allergic reaction to zoledronic acid, other medicines, foods, dyes, or preservatives pregnant or trying to get pregnant breast-feeding How should I use this medication? This drug is injected into a vein. It is given by a health care provider in a hospital or clinic setting. Talk to your health care provider about the use of this drug in children. Special care may be needed. Overdosage: If you think you have taken too much of this medicine contact a poison control center or emergency room at once. NOTE: This medicine is only for you. Do not share this medicine with others. What if I miss a dose? Keep appointments for follow-up doses. It is important not to miss your dose. Call your health care provider if you are unable to keep an appointment. What may interact with this medication? certain antibiotics given by injection NSAIDs, medicines for pain and inflammation, like ibuprofen or naproxen some diuretics like bumetanide, furosemide teriparatide thalidomide This list may not describe all possible interactions. Give your health care provider a list of all the medicines, herbs, non-prescription drugs, or dietary supplements you use. Also tell them if you smoke, drink alcohol, or  use illegal drugs. Some items may interact with your medicine. What should I watch for while using this medication? Visit your health care provider for regular checks on your progress. It may be some time before you see the benefit from this drug. Some people who take this drug have severe bone, joint, or muscle pain. This drug may also increase your risk for jaw problems or a broken thigh bone. Tell your health care provider right away if you have severe pain in your jaw, bones, joints, or muscles. Tell you health care provider if you have any pain that does not go away or that gets worse. Tell your dentist and dental surgeon that you are taking this drug. You should not have major dental surgery while on this drug. See your dentist to have a dental exam and fix any dental problems before starting this drug. Take good care of your teeth while on this drug. Make sure you see your dentist for regular follow-up appointments. You should make sure you get enough calcium and vitamin D while you are taking this drug. Discuss the foods you eat and the vitamins you take with your health care provider. Check with your health care provider if you have severe diarrhea, nausea, and vomiting, or if you sweat a lot. The loss of too much body fluid may make it dangerous for you to take this drug. You may need blood work done while you are taking this drug. Do not become pregnant while taking this drug. Women should inform their health care provider if they wish to become pregnant or think they might be pregnant. There is potential for serious  harm to an unborn child. Talk to your health care provider for more information. ?What side effects may I notice from receiving this medication? ?Side effects that you should report to your doctor or health care provider as soon as possible: ?allergic reactions (skin rash, itching or hives; swelling of the face, lips, or tongue) ?bone pain ?infection (fever, chills, cough, sore  throat, pain or trouble passing urine) ?jaw pain, especially after dental work ?joint pain ?kidney injury (trouble passing urine or change in the amount of urine) ?low blood pressure (dizziness; feeling faint or lightheaded, falls; unusually weak or tired) ?low calcium levels (fast heartbeat; muscle cramps or pain; pain, tingling, or numbness in the hands or feet; seizures) ?low magnesium levels (fast, irregular heartbeat; muscle cramp or pain; muscle weakness; tremors; seizures) ?low red blood cell counts (trouble breathing; feeling faint; lightheaded, falls; unusually weak or tired) ?muscle pain ?redness, blistering, peeling, or loosening of the skin, including inside the mouth ?severe diarrhea ?swelling of the ankles, feet, hands ?trouble breathing ?Side effects that usually do not require medical attention (report to your doctor or health care provider if they continue or are bothersome): ?anxious ?constipation ?coughing ?depressed mood ?eye irritation, itching, or pain ?fever ?general ill feeling or flu-like symptoms ?nausea ?pain, redness, or irritation at site where injected ?trouble sleeping ?This list may not describe all possible side effects. Call your doctor for medical advice about side effects. You may report side effects to FDA at 1-800-FDA-1088. ?Where should I keep my medication? ?This drug is given in a hospital or clinic. It will not be stored at home. ?NOTE: This sheet is a summary. It may not cover all possible information. If you have questions about this medicine, talk to your doctor, pharmacist, or health care provider. ?? 2022 Elsevier/Gold Standard (2020-12-05 00:00:00) ? ?

## 2022-04-22 DIAGNOSIS — H349 Unspecified retinal vascular occlusion: Secondary | ICD-10-CM | POA: Diagnosis not present

## 2022-04-22 DIAGNOSIS — H401132 Primary open-angle glaucoma, bilateral, moderate stage: Secondary | ICD-10-CM | POA: Diagnosis not present

## 2022-05-31 DIAGNOSIS — R7303 Prediabetes: Secondary | ICD-10-CM | POA: Diagnosis not present

## 2022-05-31 DIAGNOSIS — I10 Essential (primary) hypertension: Secondary | ICD-10-CM | POA: Diagnosis not present

## 2022-06-05 DIAGNOSIS — C9001 Multiple myeloma in remission: Secondary | ICD-10-CM | POA: Diagnosis not present

## 2022-06-05 DIAGNOSIS — N1831 Chronic kidney disease, stage 3a: Secondary | ICD-10-CM | POA: Diagnosis not present

## 2022-06-05 DIAGNOSIS — Z0000 Encounter for general adult medical examination without abnormal findings: Secondary | ICD-10-CM | POA: Diagnosis not present

## 2022-06-05 DIAGNOSIS — M810 Age-related osteoporosis without current pathological fracture: Secondary | ICD-10-CM | POA: Diagnosis not present

## 2022-06-05 DIAGNOSIS — G620 Drug-induced polyneuropathy: Secondary | ICD-10-CM | POA: Diagnosis not present

## 2022-06-05 DIAGNOSIS — Z23 Encounter for immunization: Secondary | ICD-10-CM | POA: Diagnosis not present

## 2022-06-05 DIAGNOSIS — I10 Essential (primary) hypertension: Secondary | ICD-10-CM | POA: Diagnosis not present

## 2022-06-05 DIAGNOSIS — M109 Gout, unspecified: Secondary | ICD-10-CM | POA: Diagnosis not present

## 2022-06-05 DIAGNOSIS — Z9481 Bone marrow transplant status: Secondary | ICD-10-CM | POA: Diagnosis not present

## 2022-06-05 DIAGNOSIS — K22719 Barrett's esophagus with dysplasia, unspecified: Secondary | ICD-10-CM | POA: Diagnosis not present

## 2022-06-05 DIAGNOSIS — G5601 Carpal tunnel syndrome, right upper limb: Secondary | ICD-10-CM | POA: Diagnosis not present

## 2022-06-05 DIAGNOSIS — E559 Vitamin D deficiency, unspecified: Secondary | ICD-10-CM | POA: Diagnosis not present

## 2022-06-17 ENCOUNTER — Encounter: Payer: Self-pay | Admitting: Internal Medicine

## 2022-06-17 DIAGNOSIS — G5601 Carpal tunnel syndrome, right upper limb: Secondary | ICD-10-CM | POA: Diagnosis not present

## 2022-06-17 DIAGNOSIS — R202 Paresthesia of skin: Secondary | ICD-10-CM | POA: Diagnosis not present

## 2022-06-17 DIAGNOSIS — M1811 Unilateral primary osteoarthritis of first carpometacarpal joint, right hand: Secondary | ICD-10-CM | POA: Diagnosis not present

## 2022-06-17 DIAGNOSIS — M79641 Pain in right hand: Secondary | ICD-10-CM | POA: Diagnosis not present

## 2022-06-17 DIAGNOSIS — R200 Anesthesia of skin: Secondary | ICD-10-CM | POA: Diagnosis not present

## 2022-07-17 ENCOUNTER — Telehealth: Payer: Self-pay | Admitting: Internal Medicine

## 2022-07-17 ENCOUNTER — Inpatient Hospital Stay: Payer: 59 | Attending: Internal Medicine

## 2022-07-17 ENCOUNTER — Other Ambulatory Visit: Payer: Self-pay | Admitting: Medical Oncology

## 2022-07-17 ENCOUNTER — Other Ambulatory Visit: Payer: Self-pay | Admitting: Orthopedic Surgery

## 2022-07-17 ENCOUNTER — Other Ambulatory Visit: Payer: Self-pay

## 2022-07-17 ENCOUNTER — Inpatient Hospital Stay: Payer: 59

## 2022-07-17 DIAGNOSIS — G5611 Other lesions of median nerve, right upper limb: Secondary | ICD-10-CM | POA: Diagnosis not present

## 2022-07-17 DIAGNOSIS — C9000 Multiple myeloma not having achieved remission: Secondary | ICD-10-CM

## 2022-07-17 DIAGNOSIS — G5601 Carpal tunnel syndrome, right upper limb: Secondary | ICD-10-CM | POA: Diagnosis not present

## 2022-07-17 LAB — BASIC METABOLIC PANEL - CANCER CENTER ONLY
Anion gap: 4 — ABNORMAL LOW (ref 5–15)
BUN: 14 mg/dL (ref 8–23)
CO2: 31 mmol/L (ref 22–32)
Calcium: 9.8 mg/dL (ref 8.9–10.3)
Chloride: 106 mmol/L (ref 98–111)
Creatinine: 0.86 mg/dL (ref 0.44–1.00)
GFR, Estimated: >60 mL/min (ref >60)
Glucose, Bld: 95 mg/dL (ref 70–99)
Potassium: 3.4 mmol/L — ABNORMAL LOW (ref 3.5–5.1)
Sodium: 141 mmol/L (ref 135–145)

## 2022-07-17 MED ORDER — ZOLEDRONIC ACID 4 MG/100ML IV SOLN
4.0000 mg | Freq: Once | INTRAVENOUS | Status: AC
  Administered 2022-07-17: 4 mg via INTRAVENOUS
  Filled 2022-07-17: qty 100

## 2022-07-17 MED ORDER — SODIUM CHLORIDE 0.9 % IV SOLN: INTRAVENOUS | Status: DC

## 2022-07-17 NOTE — Patient Instructions (Signed)

## 2022-07-17 NOTE — Telephone Encounter (Signed)
Patient called to verify time of appointments today.

## 2022-07-17 NOTE — Telephone Encounter (Signed)
Reached out to patient to add Labs before todays infusion per 4/17 IB, left voicemail.

## 2022-08-06 ENCOUNTER — Encounter: Payer: Self-pay | Admitting: Internal Medicine

## 2022-08-07 DIAGNOSIS — H401132 Primary open-angle glaucoma, bilateral, moderate stage: Secondary | ICD-10-CM | POA: Diagnosis not present

## 2022-08-07 DIAGNOSIS — H2513 Age-related nuclear cataract, bilateral: Secondary | ICD-10-CM | POA: Diagnosis not present

## 2022-08-07 DIAGNOSIS — H25013 Cortical age-related cataract, bilateral: Secondary | ICD-10-CM | POA: Diagnosis not present

## 2022-09-23 ENCOUNTER — Encounter (HOSPITAL_BASED_OUTPATIENT_CLINIC_OR_DEPARTMENT_OTHER): Payer: Self-pay

## 2022-09-23 ENCOUNTER — Ambulatory Visit (HOSPITAL_BASED_OUTPATIENT_CLINIC_OR_DEPARTMENT_OTHER): Admit: 2022-09-23 | Payer: 59 | Admitting: Orthopedic Surgery

## 2022-09-23 SURGERY — CARPAL TUNNEL RELEASE
Anesthesia: Choice | Laterality: Right

## 2022-10-09 ENCOUNTER — Inpatient Hospital Stay: Payer: 59 | Attending: Internal Medicine

## 2022-10-09 DIAGNOSIS — Z79899 Other long term (current) drug therapy: Secondary | ICD-10-CM | POA: Diagnosis not present

## 2022-10-09 DIAGNOSIS — C9000 Multiple myeloma not having achieved remission: Secondary | ICD-10-CM | POA: Diagnosis not present

## 2022-10-09 LAB — CBC WITH DIFFERENTIAL (CANCER CENTER ONLY)
Abs Immature Granulocytes: 0.02 10*3/uL (ref 0.00–0.07)
Basophils Absolute: 0 10*3/uL (ref 0.0–0.1)
Basophils Relative: 0 %
Eosinophils Absolute: 0 10*3/uL (ref 0.0–0.5)
Eosinophils Relative: 1 %
HCT: 35.1 % — ABNORMAL LOW (ref 36.0–46.0)
Hemoglobin: 11.5 g/dL — ABNORMAL LOW (ref 12.0–15.0)
Immature Granulocytes: 0 %
Lymphocytes Relative: 35 %
Lymphs Abs: 2.5 10*3/uL (ref 0.7–4.0)
MCH: 31.9 pg (ref 26.0–34.0)
MCHC: 32.8 g/dL (ref 30.0–36.0)
MCV: 97.5 fL (ref 80.0–100.0)
Monocytes Absolute: 0.6 10*3/uL (ref 0.1–1.0)
Monocytes Relative: 8 %
Neutro Abs: 4.1 10*3/uL (ref 1.7–7.7)
Neutrophils Relative %: 56 %
Platelet Count: 211 10*3/uL (ref 150–400)
RBC: 3.6 MIL/uL — ABNORMAL LOW (ref 3.87–5.11)
RDW: 13.9 % (ref 11.5–15.5)
WBC Count: 7.2 10*3/uL (ref 4.0–10.5)
nRBC: 0 % (ref 0.0–0.2)

## 2022-10-09 LAB — CMP (CANCER CENTER ONLY)
ALT: 14 U/L (ref 0–44)
AST: 17 U/L (ref 15–41)
Albumin: 3.9 g/dL (ref 3.5–5.0)
Alkaline Phosphatase: 53 U/L (ref 38–126)
Anion gap: 5 (ref 5–15)
BUN: 12 mg/dL (ref 8–23)
CO2: 29 mmol/L (ref 22–32)
Calcium: 9.5 mg/dL (ref 8.9–10.3)
Chloride: 107 mmol/L (ref 98–111)
Creatinine: 0.89 mg/dL (ref 0.44–1.00)
GFR, Estimated: >60 mL/min (ref >60)
Glucose, Bld: 85 mg/dL (ref 70–99)
Potassium: 3.7 mmol/L (ref 3.5–5.1)
Sodium: 141 mmol/L (ref 135–145)
Total Bilirubin: 0.3 mg/dL (ref 0.3–1.2)
Total Protein: 7.5 g/dL (ref 6.5–8.1)

## 2022-10-09 LAB — LACTATE DEHYDROGENASE: LDH: 165 U/L (ref 98–192)

## 2022-10-10 LAB — KAPPA/LAMBDA LIGHT CHAINS
Kappa free light chain: 62.2 mg/L — ABNORMAL HIGH (ref 3.3–19.4)
Kappa, lambda light chain ratio: 3.79 — ABNORMAL HIGH (ref 0.26–1.65)
Lambda free light chains: 16.4 mg/L (ref 5.7–26.3)

## 2022-10-10 LAB — BETA 2 MICROGLOBULIN, SERUM: Beta-2 Microglobulin: 1.6 mg/L (ref 0.6–2.4)

## 2022-10-10 LAB — IGG, IGA, IGM
IgA: 335 mg/dL (ref 64–422)
IgG (Immunoglobin G), Serum: 1353 mg/dL (ref 586–1602)
IgM (Immunoglobulin M), Srm: 57 mg/dL (ref 26–217)

## 2022-10-15 ENCOUNTER — Other Ambulatory Visit: Payer: Self-pay | Admitting: Physician Assistant

## 2022-10-16 ENCOUNTER — Inpatient Hospital Stay: Payer: 59 | Admitting: Internal Medicine

## 2022-10-16 ENCOUNTER — Other Ambulatory Visit: Payer: Self-pay

## 2022-10-16 ENCOUNTER — Inpatient Hospital Stay: Payer: 59

## 2022-10-16 VITALS — BP 143/82 | HR 69 | Resp 17

## 2022-10-16 VITALS — BP 158/79 | HR 74 | Temp 98.5°F | Resp 17 | Wt 154.6 lb

## 2022-10-16 DIAGNOSIS — C9000 Multiple myeloma not having achieved remission: Secondary | ICD-10-CM

## 2022-10-16 DIAGNOSIS — Z79899 Other long term (current) drug therapy: Secondary | ICD-10-CM | POA: Diagnosis not present

## 2022-10-16 MED ORDER — SODIUM CHLORIDE 0.9 % IV SOLN: Freq: Once | INTRAVENOUS | Status: AC

## 2022-10-16 MED ORDER — ZOLEDRONIC ACID 4 MG/100ML IV SOLN
4.0000 mg | Freq: Once | INTRAVENOUS | Status: AC
  Administered 2022-10-16: 4 mg via INTRAVENOUS
  Filled 2022-10-16: qty 100

## 2022-10-16 NOTE — Progress Notes (Signed)
[facility] Telephone:(336) [phone]   Fax:(336) [phone]  OFFICE PROGRESS NOTE  Merri Brunette, MD [address] [place] [address]  DIAGNOSIS: Multiple myeloma, Light chain diagnosed in August 2010  PRIOR THERAPY: 1) status post systemic chemotherapy with Velcade, Doxil and Decadron between 04/24/2009 through 07/27/2009 discontinued secondary to peripheral neuropathy. 2) status post treatment with Revlimid and Decadron between 09/01/2009 through December 2011. 3) status post peripheral blood autologous stem cell transplant on 04/26/2010 at [facility] under the care of Dr. Hurman Horn. 4) maintenance chemotherapy with Revlimid 10 mg by mouth daily started June 2012. This will be changed to 5 mg by mouth daily starting next week secondary to persistent diarrhea.  CURRENT THERAPY: 1) Zometa 4 mg IV every 3 months.  INTERVAL HISTORY: Carmen Fisher 82 y.o. female returns to the clinic today for 6 months follow-up visit.  The patient is feeling fine today with no concerning complaints except for occasional dizzy spells.  She is currently on gabapentin for neuropathy which can cause the dizzy spells.  She denied having any current chest pain, shortness of breath, cough or hemoptysis.  She has no nausea, vomiting but has intermittent diarrhea with no constipation or abdominal pain.  She has no recent weight loss or night sweats.  She is here today for evaluation with repeat myeloma panel.  MEDICAL HISTORY: Past Medical History:  Diagnosis Date   Dyslipidemia    GERD (gastroesophageal reflux disease)    Gout    09-05-2017  per pt stable,  last flare-up long time ago   H/O autologous stem cell transplant ([facility]) 04-26-2010   @ [facility]   History of acute pyelonephritis 08/16/2017   History of chemotherapy 04-24-2009  to 07-27-2009   systemic   History of sepsis    09/ 2014 secondary to UTI/  08-16-2017  SIRS, severe sepsis due to acute pyelonephritis  with kidney stone obstruction   Hypokalemia    Intermittent diarrhea    Multiple myeloma in remission [facility]) oncologist-  dr Arbutus Ped ([facility] ) and dr Hurman Horn at [facility]   dx 08/ 2010-- Kappa Light Chain, completed chemo 07-27-2009 , s/p  high dose melphalan and autologous stem cell transplant 04-26-2010 @ [facility],  maintenance oral chemo (revlimid) started 06/ 2012--stopped 03/ 2019 due to neutropenia and diarrhea,  Zometa IV every 3 months   Osteoarthritis    Peripheral neuropathy due to chemotherapy [facility])    Renal calculus, right    Right ureteral calculus    Wears dentures    upper   Wears glasses     ALLERGIES:  is allergic to aspirin, ampicillin, sulfa antibiotics, and penicillins.  MEDICATIONS:  Current Outpatient Medications  Medication Sig Dispense Refill   allopurinol (ZYLOPRIM) 300 MG tablet Take 1 tablet by mouth daily.     cholecalciferol (VITAMIN D) 1000 UNITS tablet Take 1,000 Units by mouth daily.     diphenoxylate-atropine (LOMOTIL) 2.5-0.025 MG tablet 1 tablet as needed     dorzolamide-timolol (COSOPT) 22.3-6.8 MG/ML ophthalmic solution Place 1 drop into both eyes 2 (two) times daily.      gabapentin (NEURONTIN) 600 MG tablet Take 1 tablet (600 mg total) by mouth 2 (two) times daily. 60 tablet 0   latanoprost (XALATAN) 0.005 % ophthalmic solution Place 1 drop into both eyes at bedtime.      Multiple Vitamin (MULTIVITAMIN WITH MINERALS) TABS tablet Take 1 tablet by mouth daily.     pantoprazole (PROTONIX) 40 MG  tablet Take 1 tablet (40 mg total) by mouth daily. (Patient taking differently: Take 20 mg by mouth daily. Takes 1/2 tablet) 90 tablet 0   LORazepam (ATIVAN) 1 MG tablet Take 1 mg by mouth every 4 (four) hours as needed (neuropathy). Reported on 09/14/2015 (Patient not taking: Reported on 10/16/2022)     No current facility-administered medications for this visit.    SURGICAL HISTORY:  Past Surgical History:  Procedure Laterality Date   APPENDECTOMY   1990s   CARPAL TUNNEL RELEASE Left 10-14-2001   [facility]   AND A-1 PULLEY RELEASE LEFT RING FINGER   CYSTOSCOPY W/ URETERAL STENT PLACEMENT Right 08/16/2017   Procedure: CYSTOSCOPY WITH RETROGRADE PYELOGRAM/URETERAL STENT PLACEMENT;  Surgeon: Crist Fat, MD;  Location: [facility] OR;  Service: Urology;  Laterality: Right;   CYSTOSCOPY/RETROGRADE/URETEROSCOPY Right 09/08/2017   Procedure: CYSTOSCOPY/RETROGRADE/URETEROSCOPY/STONE EXTRACTION/ STENT EXCHANGE, STONE BASKET RETRIVAL, LASER;  Surgeon: Ihor Gully, MD;  Location[facility];  Service: Urology;  Laterality: Right;   VAGINAL HYSTERECTOMY  1994   W/  BSO    REVIEW OF SYSTEMS:  A comprehensive review of systems was negative except for: Gastrointestinal: positive for diarrhea Neurological: positive for dizziness   PHYSICAL EXAMINATION: General appearance: alert, cooperative, and no distress Head: Normocephalic, without obvious abnormality, atraumatic Neck: no adenopathy, no JVD, supple, symmetrical, trachea midline, and thyroid not enlarged, symmetric, no tenderness/mass/nodules Lymph nodes: Cervical, supraclavicular, and axillary nodes normal. Resp: clear to auscultation bilaterally Back: symmetric, no curvature. ROM normal. No CVA tenderness. Cardio: regular rate and rhythm, S1, S2 normal, no murmur, click, rub or gallop GI: soft, non-tender; bowel sounds normal; no masses,  no organomegaly Extremities: extremities normal, atraumatic, no cyanosis or edema  ECOG PERFORMANCE STATUS: 1 - Symptomatic but completely ambulatory  Blood pressure (!) 158/79, pulse 74, temperature 98.5 F (36.9 C), temperature source Oral, resp. rate 17, weight 154 lb 9.6 oz (70.1 kg), SpO2 98%.  LABORATORY DATA: Lab Results  Component Value Date   WBC 7.2 10/09/2022   HGB 11.5 (L) 10/09/2022   HCT 35.1 (L) 10/09/2022   MCV 97.5 10/09/2022   PLT 211 10/09/2022      Chemistry      Component Value Date/Time   NA 141 10/09/2022 1319    NA 141 03/06/2017 1024   K 3.7 10/09/2022 1319   K 3.9 03/06/2017 1024   CL 107 10/09/2022 1319   CL 106 07/28/2012 0924   CO2 29 10/09/2022 1319   CO2 25 03/06/2017 1024   BUN 12 10/09/2022 1319   BUN 14.3 03/06/2017 1024   CREATININE 0.89 10/09/2022 1319   CREATININE 1.0 03/06/2017 1024      Component Value Date/Time   CALCIUM 9.5 10/09/2022 1319   CALCIUM 9.8 03/06/2017 1024   ALKPHOS 53 10/09/2022 1319   ALKPHOS 74 03/06/2017 1024   AST 17 10/09/2022 1319   AST 16 03/06/2017 1024   ALT 14 10/09/2022 1319   ALT 15 03/06/2017 1024   BILITOT 0.3 10/09/2022 1319   BILITOT 0.57 03/06/2017 1024       RADIOGRAPHIC STUDIES: No results found.  ASSESSMENT AND PLAN:  This is a very pleasant 82 years old African-American female with multiple myeloma and has been on maintenance Revlimid currently 5 mg by mouth daily and has been on this treatment for almost 6 years. Her treatment was discontinued secondary to neutropenia and persistent diarrhea.   The patient is currently on observation and feeling fine with no concerning complaints except for occasional dizzy  spells. She had repeat myeloma panel performed recently.  I discussed the lab result with the patient and there is no concerning findings for progression except for slight increase in the free kappa light chain. I recommended for her to continue on observation with repeat myeloma panel in 6 months. She will continue on Zometa infusion every 3 months. The patient was advised to call immediately if she has any other concerning symptoms in the interval. The patient voices understanding of current disease status and treatment options and is in agreement with the current care plan.  All questions were answered. The patient knows to call the clinic with any problems, questions or concerns. We can certainly see the patient much sooner if necessary.  Disclaimer: This note was dictated with voice recognition software. Similar sounding  words can inadvertently be transcribed and may not be corrected upon review.

## 2022-11-05 DIAGNOSIS — H401122 Primary open-angle glaucoma, left eye, moderate stage: Secondary | ICD-10-CM | POA: Diagnosis not present

## 2022-11-05 DIAGNOSIS — H2513 Age-related nuclear cataract, bilateral: Secondary | ICD-10-CM | POA: Diagnosis not present

## 2022-11-05 DIAGNOSIS — H5203 Hypermetropia, bilateral: Secondary | ICD-10-CM | POA: Diagnosis not present

## 2022-11-05 DIAGNOSIS — H348312 Tributary (branch) retinal vein occlusion, right eye, stable: Secondary | ICD-10-CM | POA: Diagnosis not present

## 2022-11-27 DIAGNOSIS — H2512 Age-related nuclear cataract, left eye: Secondary | ICD-10-CM | POA: Diagnosis not present

## 2022-11-27 DIAGNOSIS — H25812 Combined forms of age-related cataract, left eye: Secondary | ICD-10-CM | POA: Diagnosis not present

## 2023-01-15 ENCOUNTER — Inpatient Hospital Stay: Payer: 59 | Attending: Internal Medicine

## 2023-01-15 ENCOUNTER — Telehealth: Payer: Self-pay

## 2023-01-15 ENCOUNTER — Other Ambulatory Visit: Payer: Self-pay | Admitting: Physician Assistant

## 2023-01-15 ENCOUNTER — Inpatient Hospital Stay: Payer: 59

## 2023-01-15 VITALS — BP 154/94 | HR 73 | Temp 98.3°F | Resp 16

## 2023-01-15 DIAGNOSIS — C9000 Multiple myeloma not having achieved remission: Secondary | ICD-10-CM

## 2023-01-15 LAB — CMP (CANCER CENTER ONLY)
ALT: 13 U/L (ref 0–44)
AST: 19 U/L (ref 15–41)
Albumin: 4.2 g/dL (ref 3.5–5.0)
Alkaline Phosphatase: 51 U/L (ref 38–126)
Anion gap: 7 (ref 5–15)
BUN: 10 mg/dL (ref 8–23)
CO2: 29 mmol/L (ref 22–32)
Calcium: 9.6 mg/dL (ref 8.9–10.3)
Chloride: 106 mmol/L (ref 98–111)
Creatinine: 0.75 mg/dL (ref 0.44–1.00)
GFR, Estimated: >60 mL/min (ref >60)
Glucose, Bld: 88 mg/dL (ref 70–99)
Potassium: 3.4 mmol/L — ABNORMAL LOW (ref 3.5–5.1)
Sodium: 142 mmol/L (ref 135–145)
Total Bilirubin: 0.6 mg/dL (ref 0.3–1.2)
Total Protein: 7.7 g/dL (ref 6.5–8.1)

## 2023-01-15 MED ORDER — ZOLEDRONIC ACID 4 MG/100ML IV SOLN
4.0000 mg | Freq: Once | INTRAVENOUS | Status: AC
  Administered 2023-01-15: 4 mg via INTRAVENOUS
  Filled 2023-01-15: qty 100

## 2023-01-15 MED ORDER — SODIUM CHLORIDE 0.9 % IV SOLN: Freq: Once | INTRAVENOUS | Status: AC

## 2023-01-15 NOTE — Patient Instructions (Signed)

## 2023-01-15 NOTE — Telephone Encounter (Signed)
TC to pt x 2 without answer. TC to daughter due to need for add-on lab appt today prior to Zometa infusion. Informed daughter that lab appt is scheduled for 12:30. She states she will contact patient and let office know if there is any problem w/ her getting to this appt.

## 2023-02-05 ENCOUNTER — Other Ambulatory Visit: Payer: Self-pay | Admitting: Internal Medicine

## 2023-02-05 DIAGNOSIS — Z1231 Encounter for screening mammogram for malignant neoplasm of breast: Secondary | ICD-10-CM

## 2023-02-11 DIAGNOSIS — R519 Headache, unspecified: Secondary | ICD-10-CM | POA: Diagnosis not present

## 2023-02-11 DIAGNOSIS — I10 Essential (primary) hypertension: Secondary | ICD-10-CM | POA: Diagnosis not present

## 2023-02-11 DIAGNOSIS — Z23 Encounter for immunization: Secondary | ICD-10-CM | POA: Diagnosis not present

## 2023-02-11 DIAGNOSIS — C9000 Multiple myeloma not having achieved remission: Secondary | ICD-10-CM | POA: Diagnosis not present

## 2023-02-12 ENCOUNTER — Other Ambulatory Visit: Payer: Self-pay | Admitting: Internal Medicine

## 2023-02-12 ENCOUNTER — Encounter: Payer: Self-pay | Admitting: Physician Assistant

## 2023-02-12 DIAGNOSIS — R519 Headache, unspecified: Secondary | ICD-10-CM

## 2023-02-13 DIAGNOSIS — I10 Essential (primary) hypertension: Secondary | ICD-10-CM | POA: Diagnosis not present

## 2023-02-13 DIAGNOSIS — C9000 Multiple myeloma not having achieved remission: Secondary | ICD-10-CM | POA: Diagnosis not present

## 2023-02-13 DIAGNOSIS — R519 Headache, unspecified: Secondary | ICD-10-CM | POA: Diagnosis not present

## 2023-02-26 ENCOUNTER — Ambulatory Visit
Admission: RE | Admit: 2023-02-26 | Discharge: 2023-02-26 | Disposition: A | Discharge status: Home / Self Care | Payer: 59 | Source: Ambulatory Visit | Attending: Internal Medicine | Admitting: Internal Medicine

## 2023-02-26 DIAGNOSIS — Z1231 Encounter for screening mammogram for malignant neoplasm of breast: Secondary | ICD-10-CM

## 2023-03-05 DIAGNOSIS — I10 Essential (primary) hypertension: Secondary | ICD-10-CM | POA: Diagnosis not present

## 2023-03-05 DIAGNOSIS — K529 Noninfective gastroenteritis and colitis, unspecified: Secondary | ICD-10-CM | POA: Diagnosis not present

## 2023-03-14 ENCOUNTER — Ambulatory Visit
Admission: RE | Admit: 2023-03-14 | Discharge: 2023-03-14 | Disposition: A | Discharge status: Home / Self Care | Payer: 59 | Source: Ambulatory Visit | Attending: Internal Medicine | Admitting: Internal Medicine

## 2023-03-14 DIAGNOSIS — R519 Headache, unspecified: Secondary | ICD-10-CM | POA: Diagnosis not present

## 2023-03-14 DIAGNOSIS — R42 Dizziness and giddiness: Secondary | ICD-10-CM | POA: Diagnosis not present

## 2023-04-09 ENCOUNTER — Encounter: Payer: Self-pay | Admitting: Physician Assistant

## 2023-04-15 DIAGNOSIS — I10 Essential (primary) hypertension: Secondary | ICD-10-CM | POA: Diagnosis not present

## 2023-04-16 ENCOUNTER — Inpatient Hospital Stay: Payer: 59

## 2023-04-16 ENCOUNTER — Inpatient Hospital Stay: Payer: 59 | Attending: Internal Medicine | Admitting: Internal Medicine

## 2023-04-16 VITALS — BP 134/75 | HR 74 | Temp 97.3°F | Resp 16 | Ht 65.0 in | Wt 150.6 lb

## 2023-04-16 VITALS — BP 140/77 | HR 75 | Resp 14

## 2023-04-16 DIAGNOSIS — C9000 Multiple myeloma not having achieved remission: Secondary | ICD-10-CM | POA: Diagnosis not present

## 2023-04-16 DIAGNOSIS — Z79899 Other long term (current) drug therapy: Secondary | ICD-10-CM | POA: Diagnosis not present

## 2023-04-16 LAB — CMP (CANCER CENTER ONLY)
ALT: 15 U/L (ref 0–44)
AST: 19 U/L (ref 15–41)
Albumin: 4.1 g/dL (ref 3.5–5.0)
Alkaline Phosphatase: 51 U/L (ref 38–126)
Anion gap: 6 (ref 5–15)
BUN: 15 mg/dL (ref 8–23)
CO2: 31 mmol/L (ref 22–32)
Calcium: 9.6 mg/dL (ref 8.9–10.3)
Chloride: 105 mmol/L (ref 98–111)
Creatinine: 0.96 mg/dL (ref 0.44–1.00)
GFR, Estimated: 60 mL/min — ABNORMAL LOW (ref >60)
Glucose, Bld: 102 mg/dL — ABNORMAL HIGH (ref 70–99)
Potassium: 3 mmol/L — ABNORMAL LOW (ref 3.5–5.1)
Sodium: 142 mmol/L (ref 135–145)
Total Bilirubin: 0.5 mg/dL (ref 0.0–1.2)
Total Protein: 7.5 g/dL (ref 6.5–8.1)

## 2023-04-16 LAB — CBC WITH DIFFERENTIAL (CANCER CENTER ONLY)
Abs Immature Granulocytes: 0.02 10*3/uL (ref 0.00–0.07)
Basophils Absolute: 0 10*3/uL (ref 0.0–0.1)
Basophils Relative: 1 %
Eosinophils Absolute: 0.1 10*3/uL (ref 0.0–0.5)
Eosinophils Relative: 1 %
HCT: 34.8 % — ABNORMAL LOW (ref 36.0–46.0)
Hemoglobin: 11.6 g/dL — ABNORMAL LOW (ref 12.0–15.0)
Immature Granulocytes: 0 %
Lymphocytes Relative: 31 %
Lymphs Abs: 2 10*3/uL (ref 0.7–4.0)
MCH: 31.4 pg (ref 26.0–34.0)
MCHC: 33.3 g/dL (ref 30.0–36.0)
MCV: 94.1 fL (ref 80.0–100.0)
Monocytes Absolute: 0.5 10*3/uL (ref 0.1–1.0)
Monocytes Relative: 7 %
Neutro Abs: 3.9 10*3/uL (ref 1.7–7.7)
Neutrophils Relative %: 60 %
Platelet Count: 242 10*3/uL (ref 150–400)
RBC: 3.7 MIL/uL — ABNORMAL LOW (ref 3.87–5.11)
RDW: 13.9 % (ref 11.5–15.5)
WBC Count: 6.5 10*3/uL (ref 4.0–10.5)
nRBC: 0 % (ref 0.0–0.2)

## 2023-04-16 LAB — LACTATE DEHYDROGENASE: LDH: 175 U/L (ref 98–192)

## 2023-04-16 MED ORDER — POTASSIUM CHLORIDE CRYS ER 20 MEQ PO TBCR: 20.0000 meq | EXTENDED_RELEASE_TABLET | Freq: Every day | ORAL | 0 refills | Status: AC

## 2023-04-16 MED ORDER — SODIUM CHLORIDE 0.9 % IV SOLN: Freq: Once | INTRAVENOUS | Status: AC

## 2023-04-16 MED ORDER — ZOLEDRONIC ACID 4 MG/100ML IV SOLN
4.0000 mg | Freq: Once | INTRAVENOUS | Status: AC
Start: 2023-04-16 — End: 2023-04-16
  Administered 2023-04-16: 4 mg via INTRAVENOUS
  Filled 2023-04-16: qty 100

## 2023-04-16 NOTE — Patient Instructions (Signed)

## 2023-04-16 NOTE — Progress Notes (Signed)
 [facility] [phone]   [phone]  OFFICE PROGRESS NOTE  Imelda Man, MD [address] [place] [address]  DIAGNOSIS: Multiple myeloma, Light chain diagnosed in August 2010  PRIOR THERAPY: 1) status post systemic chemotherapy with Velcade, Doxil and Decadron  between 04/24/2009 through 07/27/2009 discontinued secondary to peripheral neuropathy. 2) status post treatment with Revlimid  and Decadron  between 09/01/2009 through December 2011. 3) status post peripheral blood autologous stem cell transplant on 04/26/2010 at [facility] under the care of Dr. Carlota Chestnut. 4) maintenance chemotherapy with Revlimid  10 mg by mouth daily started June 2012. This will be changed to 5 mg by mouth daily starting next week secondary to persistent diarrhea.  CURRENT THERAPY: 1) Zometa  4 mg IV every 3 months.  INTERVAL HISTORY: Carmen Fisher 83 y.o. female returns to the clinic for 11-month follow-up visit.Discussed the use of AI scribe software for clinical note transcription with the patient, who gave verbal consent to proceed.  History of Present Illness   The patient, with a history of myeloma, was last seen six months ago. She reports an ongoing issue with diarrhea, even after discontinuation of Revlimid  due to severe diarrhea. The frequency of diarrhea varies, with some days experiencing up to three episodes, while other days are symptom-free. The patient has been managing the diarrhea with Lomotil  and Imodium , which provide relief when taken.  The patient also reports episodes of elevated blood pressure, although readings at this visit were within acceptable ranges. She has been receiving Zometa  infusions every three months for her myeloma.  The patient's diarrhea has led to a decrease in potassium levels, with a recent reading of 3.0. She had previously been on potassium supplements, which were discontinued at some point. The patient  has agreed to restart potassium supplementation.      MEDICAL HISTORY: Past Medical History:  Diagnosis Date   Dyslipidemia    GERD (gastroesophageal reflux disease)    Gout    09-05-2017  per pt stable,  last flare-up long time ago   H/O autologous stem cell transplant (HCC) 04-26-2010   @ [facility]   History of acute pyelonephritis 08/16/2017   History of chemotherapy 04-24-2009  to 07-27-2009   systemic   History of sepsis    09/ 2014 secondary to UTI/  08-16-2017  SIRS, severe sepsis due to acute pyelonephritis with kidney stone obstruction   Hypokalemia    Intermittent diarrhea    Multiple myeloma in remission [facility]) oncologist-  dr Marguerita Shih ([facility] ) and dr Carlota Chestnut at [facility]   dx 08/ 2010-- Kappa Light Chain, completed chemo 07-27-2009 , s/p  high dose melphalan and autologous stem cell transplant 04-26-2010 @ [facility],  maintenance oral chemo (revlimid ) started 06/ 2012--stopped 03/ 2019 due to neutropenia and diarrhea,  Zometa  IV every 3 months   Osteoarthritis    Peripheral neuropathy due to chemotherapy [facility])    Renal calculus, right    Right ureteral calculus    Wears dentures    upper   Wears glasses     ALLERGIES:  is allergic to aspirin, ampicillin, sulfa antibiotics, and penicillins.  MEDICATIONS:  Current Outpatient Medications  Medication Sig Dispense Refill   allopurinol  (ZYLOPRIM ) 300 MG tablet Take 1 tablet by mouth daily.     cholecalciferol  (VITAMIN D ) 1000 UNITS tablet Take 1,000 Units by mouth daily.     diphenoxylate -atropine  (LOMOTIL ) 2.5-0.025 MG tablet 1 tablet as needed     dorzolamide -timolol  (COSOPT ) 22.3-6.8  MG/ML ophthalmic solution Place 1 drop into both eyes 2 (two) times daily.      gabapentin  (NEURONTIN ) 600 MG tablet Take 1 tablet (600 mg total) by mouth 2 (two) times daily. 60 tablet 0   latanoprost  (XALATAN ) 0.005 % ophthalmic solution Place 1 drop into both eyes at bedtime.      LORazepam (ATIVAN) 1 MG tablet Take 1 mg by mouth every 4  (four) hours as needed (neuropathy). Reported on 09/14/2015 (Patient not taking: Reported on 10/16/2022)     Multiple Vitamin (MULTIVITAMIN WITH MINERALS) TABS tablet Take 1 tablet by mouth daily.     pantoprazole  (PROTONIX ) 40 MG tablet Take 1 tablet (40 mg total) by mouth daily. (Patient taking differently: Take 20 mg by mouth daily. Takes 1/2 tablet) 90 tablet 0   No current facility-administered medications for this visit.    SURGICAL HISTORY:  Past Surgical History:  Procedure Laterality Date   APPENDECTOMY  1990s   CARPAL TUNNEL RELEASE Left 10-14-2001   [facility]   AND A-1 PULLEY RELEASE LEFT RING FINGER   CYSTOSCOPY W/ URETERAL STENT PLACEMENT Right 08/16/2017   Procedure: CYSTOSCOPY WITH RETROGRADE PYELOGRAM/URETERAL STENT PLACEMENT;  Surgeon: Andrez Banker, MD;  Location[facility] OR;  Service: Urology;  Laterality: Right;   CYSTOSCOPY/RETROGRADE/URETEROSCOPY Right 09/08/2017   Procedure: CYSTOSCOPY/RETROGRADE/URETEROSCOPY/STONE EXTRACTION/ STENT EXCHANGE, STONE BASKET RETRIVAL, LASER;  Surgeon: Ottelin, Mark, MD;  Location: [facility];  Service: Urology;  Laterality: Right;   VAGINAL HYSTERECTOMY  1994   W/  BSO    REVIEW OF SYSTEMS:  Constitutional: positive for fatigue Eyes: negative Ears, nose, mouth, throat, and face: negative Respiratory: negative Cardiovascular: negative Gastrointestinal: positive for diarrhea Genitourinary:negative Integument/breast: negative Hematologic/lymphatic: negative Musculoskeletal:negative Neurological: negative Behavioral/Psych: negative Endocrine: negative Allergic/Immunologic: negative   PHYSICAL EXAMINATION: General appearance: alert, cooperative, and no distress Head: Normocephalic, without obvious abnormality, atraumatic Neck: no adenopathy, no JVD, supple, symmetrical, trachea midline, and thyroid  not enlarged, symmetric, no tenderness/mass/nodules Lymph nodes: Cervical, supraclavicular, and axillary nodes normal. Resp:  clear to auscultation bilaterally Back: symmetric, no curvature. ROM normal. No CVA tenderness. Cardio: regular rate and rhythm, S1, S2 normal, no murmur, click, rub or gallop GI: soft, non-tender; bowel sounds normal; no masses,  no organomegaly Extremities: extremities normal, atraumatic, no cyanosis or edema Neurologic: Alert and oriented X 3, normal strength and tone. Normal symmetric reflexes. Normal coordination and gait  ECOG PERFORMANCE STATUS: 1 - Symptomatic but completely ambulatory  Blood pressure 134/75, pulse 74, temperature (!) 97.3 F (36.3 C), temperature source Temporal, resp. rate 16, height 5\' 5"  (1.651 m), weight 150 lb 9.6 oz (68.3 kg), SpO2 99%.  LABORATORY DATA: Lab Results  Component Value Date   WBC 6.5 04/16/2023   HGB 11.6 (L) 04/16/2023   HCT 34.8 (L) 04/16/2023   MCV 94.1 04/16/2023   PLT 242 04/16/2023      Chemistry      Component Value Date/Time   NA 142 04/16/2023 1051   NA 141 03/06/2017 1024   K 3.0 (L) 04/16/2023 1051   K 3.9 03/06/2017 1024   CL 105 04/16/2023 1051   CL 106 07/28/2012 0924   CO2 31 04/16/2023 1051   CO2 25 03/06/2017 1024   BUN 15 04/16/2023 1051   BUN 14.3 03/06/2017 1024   CREATININE 0.96 04/16/2023 1051   CREATININE 1.0 03/06/2017 1024      Component Value Date/Time   CALCIUM 9.6 04/16/2023 1051   CALCIUM 9.8 03/06/2017 1024   ALKPHOS 51 04/16/2023 1051  ALKPHOS 74 03/06/2017 1024   AST 19 04/16/2023 1051   AST 16 03/06/2017 1024   ALT 15 04/16/2023 1051   ALT 15 03/06/2017 1024   BILITOT 0.5 04/16/2023 1051   BILITOT 0.57 03/06/2017 1024       RADIOGRAPHIC STUDIES: No results found.  ASSESSMENT AND PLAN:  This is a very pleasant 83 years old African-American female with multiple myeloma and has been on maintenance Revlimid  currently 5 mg by mouth daily and has been on this treatment for almost 6 years. Her treatment was discontinued secondary to neutropenia and persistent diarrhea.   The patient is  currently on observation and she is feeling fine except for the persistent diarrhea that continued even after discontinuation of her treatment with Revlimid  and she is currently on Lomotil  and Imodium .    Multiple Myeloma Ongoing management with regular myeloma panel tests. Current blood count and chemistry results are available, but protein study results are pending. Scheduled for Zometa  infusion today. Discussed the importance of continuing Zometa  every three months. - Administer Zometa  infusion today - Review pending protein study results and follow up if concerning findings - Continue Zometa  every three months  Diarrhea Chronic diarrhea, persisting post-Revlimid  discontinuation. Frequency is about three times a day, with intermittent symptom-free days. Managed with Lomotil  and Imodium , providing relief when taken. Discussed ongoing management and monitoring. - Continue Lomotil  and Imodium  as needed - Monitor frequency and severity of diarrhea  Hypokalemia Potassium level is 3.0 mmol/L, likely secondary to chronic diarrhea. Discussed need for potassium supplementation to prevent complications. Preference for prescription potassium supplements over dietary changes. - Prescribe potassium supplements - Advise dietary intake of potassium-rich foods such as orange juice and bananas  Hypertension Reported elevated blood pressure. Today's reading is 134/75 mmHg, within acceptable range. No immediate changes to management discussed. - Monitor blood pressure regularly  General Health Maintenance Routine follow-up and monitoring of chronic conditions. - Follow up in six months.   The patient was advised to call immediately if she has any other concerning symptoms in the interval.  The patient voices understanding of current disease status and treatment options and is in agreement with the current care plan.  All questions were answered. The patient knows to call the clinic with any problems,  questions or concerns. We can certainly see the patient much sooner if necessary.  Disclaimer: This note was dictated with voice recognition software. Similar sounding words can inadvertently be transcribed and may not be corrected upon review.

## 2023-04-18 LAB — KAPPA/LAMBDA LIGHT CHAINS
Kappa free light chain: 66 mg/L — ABNORMAL HIGH (ref 3.3–19.4)
Kappa, lambda light chain ratio: 4.65 — ABNORMAL HIGH (ref 0.26–1.65)
Lambda free light chains: 14.2 mg/L (ref 5.7–26.3)

## 2023-04-18 LAB — BETA 2 MICROGLOBULIN, SERUM: Beta-2 Microglobulin: 1.4 mg/L (ref 0.6–2.4)

## 2023-04-18 LAB — IGG, IGA, IGM
IgA: 350 mg/dL (ref 64–422)
IgG (Immunoglobin G), Serum: 1494 mg/dL (ref 586–1602)
IgM (Immunoglobulin M), Srm: 75 mg/dL (ref 26–217)

## 2023-04-25 ENCOUNTER — Other Ambulatory Visit: Payer: Self-pay | Admitting: Internal Medicine

## 2023-04-28 ENCOUNTER — Other Ambulatory Visit: Payer: Self-pay | Admitting: Internal Medicine

## 2023-05-13 DIAGNOSIS — I10 Essential (primary) hypertension: Secondary | ICD-10-CM | POA: Diagnosis not present

## 2023-05-15 DIAGNOSIS — Z961 Presence of intraocular lens: Secondary | ICD-10-CM | POA: Diagnosis not present

## 2023-05-15 DIAGNOSIS — H401132 Primary open-angle glaucoma, bilateral, moderate stage: Secondary | ICD-10-CM | POA: Diagnosis not present

## 2023-05-27 ENCOUNTER — Ambulatory Visit (INDEPENDENT_AMBULATORY_CARE_PROVIDER_SITE_OTHER): Payer: 59 | Admitting: Diagnostic Neuroimaging

## 2023-05-27 ENCOUNTER — Encounter: Payer: Self-pay | Admitting: Diagnostic Neuroimaging

## 2023-05-27 VITALS — BP 142/80 | HR 73 | Ht 65.0 in | Wt 153.4 lb

## 2023-05-27 DIAGNOSIS — R269 Unspecified abnormalities of gait and mobility: Secondary | ICD-10-CM

## 2023-05-27 DIAGNOSIS — G620 Drug-induced polyneuropathy: Secondary | ICD-10-CM | POA: Diagnosis not present

## 2023-05-27 DIAGNOSIS — R42 Dizziness and giddiness: Secondary | ICD-10-CM | POA: Diagnosis not present

## 2023-05-27 DIAGNOSIS — G44209 Tension-type headache, unspecified, not intractable: Secondary | ICD-10-CM | POA: Diagnosis not present

## 2023-05-27 NOTE — Patient Instructions (Signed)
  HEADACHES (tension headaches + some migraine features; more stress in the last 1-2 years) - continue ibuprofen, tylenol as needed  DIZZINESS (orthostatic lightheadedness x few seconds with standing) - supportive care; stay hydrated  BALANCE OFF / NUMBNESS IN BILATERAL HANDS AND FEET (chemotherapy neuropathy) - continue gabapentin (may increase gabapentin to 600mg  twice a day) - fall precautions reviewed; consider PT evaluation

## 2023-05-27 NOTE — Progress Notes (Signed)
 [facility]  PATIENT: Carmen Fisher DOB: November 23, 1940  REFERRING CLINICIAN: Franchot Mimes, MD HISTORY FROM: patient and chart review  REASON FOR VISIT: follow up   HISTORICAL  CHIEF COMPLAINT:  Chief Complaint  Patient presents with   New Patient (Initial Visit)    Pt in in room 6 alone. New patient paper referral for New severe headaches and balance problems. Pt said headaches are better, pt said headaches are daily takes tylenol which helps. MRI brain in epic. No recent falls, pt said balance is off and times, no dizziness.    HISTORY OF PRESENT ILLNESS:   UPDATE (05/27/23, VRP): Since last visit, here for evaluation of new onset dizziness, headaches, gait and balance difficulties.  Patient has some episodes of lightheadedness when she stands up suddenly, lasting for few seconds and then resolving.  Also having some issues with headaches for the past 10+ years.  Pain in the back of the head, 2 times per week, sometimes associated with nausea.  Sometimes having some issues with her gait and balance.  UPDATE (09/08/18, VRP): Since last visit, continues with pain in right hand, wrist and fingers.  Symptoms symptoms radiate up her right arm.  Also has some milder numbness and tingling in left hand and bilateral feet.  Continues on gabapentin 600 mg twice a day.  Now is willing to consider EMG testing and possible carpal tunnel surgery if confirmed.  PRIOR HPI (06/09/17): 83 year old female here for evaluation of right hand numbness.  2011 patient had chemotherapy for multiple myeloma and developed peripheral neuropathy.  She developed numbness and tingling in her toes and bilateral hands.  At some point she was started on gabapentin for painful neuropathy related to chemotherapy.  Over the past 2 years symptoms have continued to get worse.  Particularly in her right hand.  Patient had previous left carpal tunnel surgery in 2003.  Now she has similar symptoms in her right hand.   This affects her right hand, mainly digits 1-4.  This wakes her up at nighttime.  Patient currently on gabapentin 600 mg twice a day with mild relief.   REVIEW OF SYSTEMS: Full 14 system review of systems performed and negative with exception of: Joint pain cramps aching muscles diarrhea easy bruising blurred vision memory loss not enough sleep.  ALLERGIES: Allergies  Allergen Reactions   Aspirin Anaphylaxis and Swelling   Ampicillin Rash   Sulfa Antibiotics Hives and Swelling   Penicillins Itching and Rash    Has patient had a PCN reaction causing immediate rash, facial/tongue/throat swelling, SOB or lightheadedness with hypotension: Yes Has patient had a PCN reaction causing severe rash involving mucus membranes or skin necrosis: No Has patient had a PCN reaction that required hospitalization: No Has patient had a PCN reaction occurring within the last 10 years: No If all of the above answers are "NO", then may proceed with Cephalosporin use.   Rash and itching Has received Zosyn and Augmentin in th    HOME MEDICATIONS: Outpatient Medications Prior to Visit  Medication Sig Dispense Refill   allopurinol (ZYLOPRIM) 300 MG tablet Take 1 tablet by mouth daily.     cholecalciferol (VITAMIN D) 1000 UNITS tablet Take 1,000 Units by mouth daily.     diphenoxylate-atropine (LOMOTIL) 2.5-0.025 MG tablet 1 tablet as needed     dorzolamide-timolol (COSOPT) 22.3-6.8 MG/ML ophthalmic solution Place 1 drop into both eyes 2 (two) times daily.      gabapentin (NEURONTIN) 600 MG tablet Take 1 tablet (  600 mg total) by mouth 2 (two) times daily. 60 tablet 0   latanoprost (XALATAN) 0.005 % ophthalmic solution Place 1 drop into both eyes at bedtime.      Multiple Vitamin (MULTIVITAMIN WITH MINERALS) TABS tablet Take 1 tablet by mouth daily.     pantoprazole (PROTONIX) 40 MG tablet Take 1 tablet (40 mg total) by mouth daily. (Patient taking differently: Take 20 mg by mouth daily. Takes 1/2 tablet) 90  tablet 0   potassium chloride SA (KLOR-CON M) 20 MEQ tablet Take 1 tablet (20 mEq total) by mouth daily. 10 tablet 0   LORazepam (ATIVAN) 1 MG tablet Take 1 mg by mouth every 4 (four) hours as needed (neuropathy). Reported on 09/14/2015 (Patient not taking: Reported on 10/16/2022)     No facility-administered medications prior to visit.    PAST MEDICAL HISTORY: Past Medical History:  Diagnosis Date   Dyslipidemia    GERD (gastroesophageal reflux disease)    Gout    09-05-2017  per pt stable,  last flare-up long time ago   H/O autologous stem cell transplant (HCC) 04-26-2010   @ [facility]   History of acute pyelonephritis 08/16/2017   History of chemotherapy 04-24-2009  to 07-27-2009   systemic   History of sepsis    09/ 2014 secondary to UTI/  08-16-2017  SIRS, severe sepsis due to acute pyelonephritis with kidney stone obstruction   Hypokalemia    Intermittent diarrhea    Multiple myeloma in remission [facility]) oncologist-  dr Arbutus Ped ([facility] ) and dr Hurman Horn at [facility]   dx 08/ 2010-- Kappa Light Chain, completed chemo 07-27-2009 , s/p  high dose melphalan and autologous stem cell transplant 04-26-2010 @ [facility],  maintenance oral chemo (revlimid) started 06/ 2012--stopped 03/ 2019 due to neutropenia and diarrhea,  Zometa IV every 3 months   Osteoarthritis    Peripheral neuropathy due to chemotherapy [facility])    Renal calculus, right    Right ureteral calculus    Wears dentures    upper   Wears glasses     PAST SURGICAL HISTORY: Past Surgical History:  Procedure Laterality Date   APPENDECTOMY  1990s   CARPAL TUNNEL RELEASE Left 10-14-2001   [facility]   AND A-1 PULLEY RELEASE LEFT RING FINGER   CYSTOSCOPY W/ URETERAL STENT PLACEMENT Right 08/16/2017   Procedure: CYSTOSCOPY WITH RETROGRADE PYELOGRAM/URETERAL STENT PLACEMENT;  Surgeon: Crist Fat, MD;  Location: [facility] OR;  Service: Urology;  Laterality: Right;   CYSTOSCOPY/RETROGRADE/URETEROSCOPY Right 09/08/2017   Procedure:  CYSTOSCOPY/RETROGRADE/URETEROSCOPY/STONE EXTRACTION/ STENT EXCHANGE, STONE BASKET RETRIVAL, LASER;  Surgeon: Ihor Gully, MD;  Location: [facility];  Service: Urology;  Laterality: Right;   VAGINAL HYSTERECTOMY  1994   W/  BSO    FAMILY HISTORY: Family History  Problem Relation Age of Onset   Stroke Mother    Hypertension Mother    Cancer Father        throat   Cancer Sister        breast   Sickle cell trait Sister    Hypertension Other     SOCIAL HISTORY:  Social History   Socioeconomic History   Marital status: Widowed    Spouse name: Not on file   Number of children: 2   Years of education: Not on file   Highest education level: Not on file  Occupational History    Comment: retired- Systems analyst  Tobacco Use   Smoking status: Never   Smokeless tobacco: Never  Vaping Use  Vaping status: Never Used  Substance and Sexual Activity   Alcohol use: Not Currently    Comment: Occasionally   Drug use: No   Sexual activity: Not on file  Other Topics Concern   Not on file  Social History Narrative   ** Merged History Encounter **   Lives alone.  Children: one in [place], and [place].  Retired.     Little caffeine   Social Drivers of Corporate investment banker Strain: Not on file  Food Insecurity: Not on file  Transportation Needs: Not on file  Physical Activity: Not on file  Stress: Not on file  Social Connections: Not on file  Intimate Partner Violence: Not on file     PHYSICAL EXAM  GENERAL EXAM/CONSTITUTIONAL: Vitals:  Vitals:   05/27/23 1150  BP: (!) 142/80  Pulse: 73  Weight: 153 lb 6.4 oz (69.6 kg)  Height: 5\' 5"  (1.651 m)   Body mass index is 25.53 kg/m. No results found. Patient is in no distress; well developed, nourished and groomed; neck is supple  CARDIOVASCULAR: Examination of carotid arteries is normal; no carotid bruits Regular rate and rhythm, no murmurs Examination of peripheral vascular system by  observation and palpation is normal  EYES: Ophthalmoscopic exam of optic discs and posterior segments is normal; no papilledema or hemorrhages  MUSCULOSKELETAL: Gait, strength, tone, movements noted in Neurologic exam below  NEUROLOGIC: MENTAL STATUS:      No data to display         awake, alert, oriented to person, place and time recent and remote memory intact normal attention and concentration language fluent, comprehension intact, naming intact,  fund of knowledge appropriate  CRANIAL NERVE:  2nd, 3rd, 4th, 6th - pupils equal and reactive to light, visual fields full to confrontation, extraocular muscles intact, no nystagmus 5th - facial sensation symmetric 7th - facial strength symmetric 8th - hearing intact 9th - palate elevates symmetrically, uvula midline 11th - shoulder shrug symmetric 12th - tongue protrusion midline MASKED FACIES  MOTOR:  normal bulk and tone, full strength in the BUE, BLE; EXCEPT ATROPHY IN RIGHT > LEFT ABDUCTOR POLLICUS BREVIS  SENSORY:  normal and symmetric to light touch, pinprick, temperature, vibration; EXCEPT DECR PP AND VIB IN RIGHT HAND (DIGITS 1-4) POSITIVE PHALEN'S IN RIGHT HAND  COORDINATION:  finger-nose-finger, fine finger movements normal  REFLEXES:  deep tendon reflexes TRACE and symmetric; ABSENT AT ANKLES  GAIT/STATION:  narrow based gait    DIAGNOSTIC DATA (LABS, IMAGING, TESTING) - I reviewed patient records, labs, notes, testing and imaging myself where available.  Lab Results  Component Value Date   WBC 6.5 04/16/2023   HGB 11.6 (L) 04/16/2023   HCT 34.8 (L) 04/16/2023   MCV 94.1 04/16/2023   PLT 242 04/16/2023      Component Value Date/Time   NA 142 04/16/2023 1051   NA 141 03/06/2017 1024   K 3.0 (L) 04/16/2023 1051   K 3.9 03/06/2017 1024   CL 105 04/16/2023 1051   CL 106 07/28/2012 0924   CO2 31 04/16/2023 1051   CO2 25 03/06/2017 1024   GLUCOSE 102 (H) 04/16/2023 1051   GLUCOSE 125  03/06/2017 1024   GLUCOSE 89 07/28/2012 0924   BUN 15 04/16/2023 1051   BUN 14.3 03/06/2017 1024   CREATININE 0.96 04/16/2023 1051   CREATININE 1.0 03/06/2017 1024   CALCIUM 9.6 04/16/2023 1051   CALCIUM 9.8 03/06/2017 1024   PROT 7.5 04/16/2023 1051   PROT 7.8 03/06/2017 1024  ALBUMIN 4.1 04/16/2023 1051   ALBUMIN 4.1 03/06/2017 1024   AST 19 04/16/2023 1051   AST 16 03/06/2017 1024   ALT 15 04/16/2023 1051   ALT 15 03/06/2017 1024   ALKPHOS 51 04/16/2023 1051   ALKPHOS 74 03/06/2017 1024   BILITOT 0.5 04/16/2023 1051   BILITOT 0.57 03/06/2017 1024   GFRNONAA 60 (L) 04/16/2023 1051   GFRAA >60 09/14/2019 1253   Lab Results  Component Value Date   CHOL  06/05/2008    155        ATP III CLASSIFICATION:  <200     mg/dL   Desirable  409-811  mg/dL   Borderline High  >=914    mg/dL   High          HDL 38 (L) 06/05/2008   LDLCALC  06/05/2008    92        Total Cholesterol/HDL:CHD Risk Coronary Heart Disease Risk Table                     Men   Women  1/2 Average Risk   3.4   3.3  Average Risk       5.0   4.4  2 X Average Risk   9.6   7.1  3 X Average Risk  23.4   11.0        Use the calculated Patient Ratio above and the CHD Risk Table to determine the patient's CHD Risk.        ATP III CLASSIFICATION (LDL):  <100     mg/dL   Optimal  782-956  mg/dL   Near or Above                    Optimal  130-159  mg/dL   Borderline  213-086  mg/dL   High  >578     mg/dL   Very High   TRIG 469 06/05/2008   CHOLHDL 4.1 06/05/2008   Lab Results  Component Value Date   HGBA1C 6.3 (H) 12/05/2012   Lab Results  Component Value Date   VITAMINB12 371 02/03/2009   Lab Results  Component Value Date   TSH 0.547 Test methodology is 3rd generation TSH 06/04/2008    03/14/23 MRI brain [I reviewed images myself and agree with interpretation. -VRP] 1. No acute or reversible finding. Old small vessel cerebellar infarctions. Old lacunar infarctions in the right basal  ganglia. Mild chronic small-vessel ischemic changes of the cerebral hemispheric white matter otherwise. 2. 9 mm heavily calcified meningioma projecting inward from the lateral temporal convexity on the left, not likely of any clinical relevance. No apparent enlargement since 2019.   10/01/18 EMG/NCS - Severe right median neuropathy at the wrist consistent with severe right carpal tunnel syndrome. - Underlying axonal sensorimotor polyneuropathy.    ASSESSMENT AND PLAN  83 y.o. year old female here with:  Dx  1. Tension headache   2. Orthostatic dizziness   3. Gait difficulty   4. Chemotherapy-induced neuropathy (HCC)       PLAN:  HEADACHES (tension headaches + some migraine features; more stress in the last 1-2 years) - continue ibuprofen, tylenol as needed  DIZZINESS (orthostatic lightheadedness x few seconds with standing) - supportive care; stay hydrated  BALANCE OFF / NUMBNESS IN BILATERAL HANDS AND FEET (chemotherapy neuropathy) - continue gabapentin 600mg  twice a day - fall precautions reviewed; consider PT evaluation  Return for return to PCP, pending if symptoms worsen or fail to  improve.    Suanne Marker, MD 05/27/2023, 12:19 PM Certified in Neurology, Neurophysiology and Neuroimaging  [facility] [address], [address] [place], [place] [address] [phone]

## 2023-07-08 ENCOUNTER — Other Ambulatory Visit: Payer: Self-pay

## 2023-07-08 DIAGNOSIS — C9000 Multiple myeloma not having achieved remission: Secondary | ICD-10-CM

## 2023-07-09 ENCOUNTER — Inpatient Hospital Stay: Payer: 59

## 2023-07-09 ENCOUNTER — Inpatient Hospital Stay: Payer: 59 | Attending: Internal Medicine

## 2023-07-09 VITALS — BP 154/80 | HR 66 | Temp 98.5°F | Resp 16

## 2023-07-09 DIAGNOSIS — C9000 Multiple myeloma not having achieved remission: Secondary | ICD-10-CM

## 2023-07-09 LAB — CMP (CANCER CENTER ONLY)
ALT: 15 U/L (ref 0–44)
AST: 19 U/L (ref 15–41)
Albumin: 4.4 g/dL (ref 3.5–5.0)
Alkaline Phosphatase: 55 U/L (ref 38–126)
Anion gap: 9 (ref 5–15)
BUN: 13 mg/dL (ref 8–23)
CO2: 31 mmol/L (ref 22–32)
Calcium: 9.7 mg/dL (ref 8.9–10.3)
Chloride: 102 mmol/L (ref 98–111)
Creatinine: 0.75 mg/dL (ref 0.44–1.00)
GFR, Estimated: >60 mL/min (ref >60)
Glucose, Bld: 91 mg/dL (ref 70–99)
Potassium: 3.3 mmol/L — ABNORMAL LOW (ref 3.5–5.1)
Sodium: 142 mmol/L (ref 135–145)
Total Bilirubin: 0.6 mg/dL (ref 0.0–1.2)
Total Protein: 7.9 g/dL (ref 6.5–8.1)

## 2023-07-09 MED ORDER — ZOLEDRONIC ACID 4 MG/100ML IV SOLN
4.0000 mg | Freq: Once | INTRAVENOUS | Status: AC
Start: 2023-07-09 — End: 2023-07-09
  Administered 2023-07-09: 4 mg via INTRAVENOUS
  Filled 2023-07-09: qty 100

## 2023-07-09 MED ORDER — SODIUM CHLORIDE 0.9 % IV SOLN
Freq: Once | INTRAVENOUS | Status: AC
Start: 2023-07-09 — End: 2023-07-09

## 2023-07-09 NOTE — Progress Notes (Signed)
 No dental issues per pt.- Ca 9.7, Scr 0.75

## 2023-07-09 NOTE — Patient Instructions (Signed)

## 2023-07-24 DIAGNOSIS — M810 Age-related osteoporosis without current pathological fracture: Secondary | ICD-10-CM | POA: Diagnosis not present

## 2023-07-24 DIAGNOSIS — R7303 Prediabetes: Secondary | ICD-10-CM | POA: Diagnosis not present

## 2023-07-24 DIAGNOSIS — I10 Essential (primary) hypertension: Secondary | ICD-10-CM | POA: Diagnosis not present

## 2023-08-11 DIAGNOSIS — R197 Diarrhea, unspecified: Secondary | ICD-10-CM | POA: Diagnosis not present

## 2023-08-11 DIAGNOSIS — E559 Vitamin D deficiency, unspecified: Secondary | ICD-10-CM | POA: Diagnosis not present

## 2023-08-11 DIAGNOSIS — C9001 Multiple myeloma in remission: Secondary | ICD-10-CM | POA: Diagnosis not present

## 2023-08-11 DIAGNOSIS — Z0000 Encounter for general adult medical examination without abnormal findings: Secondary | ICD-10-CM | POA: Diagnosis not present

## 2023-08-11 DIAGNOSIS — M25511 Pain in right shoulder: Secondary | ICD-10-CM | POA: Diagnosis not present

## 2023-08-11 DIAGNOSIS — G629 Polyneuropathy, unspecified: Secondary | ICD-10-CM | POA: Diagnosis not present

## 2023-08-11 DIAGNOSIS — I10 Essential (primary) hypertension: Secondary | ICD-10-CM | POA: Diagnosis not present

## 2023-08-11 DIAGNOSIS — M810 Age-related osteoporosis without current pathological fracture: Secondary | ICD-10-CM | POA: Diagnosis not present

## 2023-08-11 DIAGNOSIS — K219 Gastro-esophageal reflux disease without esophagitis: Secondary | ICD-10-CM | POA: Diagnosis not present

## 2023-08-11 DIAGNOSIS — G8929 Other chronic pain: Secondary | ICD-10-CM | POA: Diagnosis not present

## 2023-08-11 DIAGNOSIS — G620 Drug-induced polyneuropathy: Secondary | ICD-10-CM | POA: Diagnosis not present

## 2023-08-11 DIAGNOSIS — R8271 Bacteriuria: Secondary | ICD-10-CM | POA: Diagnosis not present

## 2023-08-19 DIAGNOSIS — M19011 Primary osteoarthritis, right shoulder: Secondary | ICD-10-CM | POA: Diagnosis not present

## 2023-08-19 DIAGNOSIS — M5030 Other cervical disc degeneration, unspecified cervical region: Secondary | ICD-10-CM | POA: Diagnosis not present

## 2023-09-02 DIAGNOSIS — M6281 Muscle weakness (generalized): Secondary | ICD-10-CM | POA: Diagnosis not present

## 2023-09-02 DIAGNOSIS — M19011 Primary osteoarthritis, right shoulder: Secondary | ICD-10-CM | POA: Diagnosis not present

## 2023-09-02 DIAGNOSIS — M25611 Stiffness of right shoulder, not elsewhere classified: Secondary | ICD-10-CM | POA: Diagnosis not present

## 2023-09-16 DIAGNOSIS — M19011 Primary osteoarthritis, right shoulder: Secondary | ICD-10-CM | POA: Diagnosis not present

## 2023-09-16 DIAGNOSIS — M25611 Stiffness of right shoulder, not elsewhere classified: Secondary | ICD-10-CM | POA: Diagnosis not present

## 2023-09-16 DIAGNOSIS — M6281 Muscle weakness (generalized): Secondary | ICD-10-CM | POA: Diagnosis not present

## 2023-09-23 DIAGNOSIS — M25611 Stiffness of right shoulder, not elsewhere classified: Secondary | ICD-10-CM | POA: Diagnosis not present

## 2023-09-23 DIAGNOSIS — M19011 Primary osteoarthritis, right shoulder: Secondary | ICD-10-CM | POA: Diagnosis not present

## 2023-09-23 DIAGNOSIS — M6281 Muscle weakness (generalized): Secondary | ICD-10-CM | POA: Diagnosis not present

## 2023-10-06 ENCOUNTER — Telehealth: Payer: Self-pay | Admitting: Internal Medicine

## 2023-10-06 NOTE — Telephone Encounter (Signed)
 Left the patient a voicemail with the rescheduled and changes appointment details.

## 2023-10-08 ENCOUNTER — Inpatient Hospital Stay: Payer: 59

## 2023-10-08 ENCOUNTER — Inpatient Hospital Stay

## 2023-10-08 ENCOUNTER — Inpatient Hospital Stay: Payer: 59 | Admitting: Internal Medicine

## 2023-10-08 ENCOUNTER — Inpatient Hospital Stay: Payer: 59 | Attending: Internal Medicine

## 2023-10-08 ENCOUNTER — Other Ambulatory Visit: Payer: Self-pay | Admitting: Physician Assistant

## 2023-10-08 VITALS — BP 146/70 | HR 71 | Temp 97.9°F | Resp 16 | Wt 152.0 lb

## 2023-10-08 DIAGNOSIS — Z79899 Other long term (current) drug therapy: Secondary | ICD-10-CM | POA: Diagnosis not present

## 2023-10-08 DIAGNOSIS — Z9221 Personal history of antineoplastic chemotherapy: Secondary | ICD-10-CM | POA: Insufficient documentation

## 2023-10-08 DIAGNOSIS — C9000 Multiple myeloma not having achieved remission: Secondary | ICD-10-CM | POA: Insufficient documentation

## 2023-10-08 DIAGNOSIS — K529 Noninfective gastroenteritis and colitis, unspecified: Secondary | ICD-10-CM | POA: Diagnosis not present

## 2023-10-08 DIAGNOSIS — D649 Anemia, unspecified: Secondary | ICD-10-CM | POA: Diagnosis not present

## 2023-10-08 DIAGNOSIS — Z9484 Stem cells transplant status: Secondary | ICD-10-CM | POA: Insufficient documentation

## 2023-10-08 LAB — CBC WITH DIFFERENTIAL (CANCER CENTER ONLY)
Abs Immature Granulocytes: 0.01 K/uL (ref 0.00–0.07)
Basophils Absolute: 0 K/uL (ref 0.0–0.1)
Basophils Relative: 1 %
Eosinophils Absolute: 0 K/uL (ref 0.0–0.5)
Eosinophils Relative: 1 %
HCT: 35.2 % — ABNORMAL LOW (ref 36.0–46.0)
Hemoglobin: 11.6 g/dL — ABNORMAL LOW (ref 12.0–15.0)
Immature Granulocytes: 0 %
Lymphocytes Relative: 34 %
Lymphs Abs: 2 K/uL (ref 0.7–4.0)
MCH: 31.2 pg (ref 26.0–34.0)
MCHC: 33 g/dL (ref 30.0–36.0)
MCV: 94.6 fL (ref 80.0–100.0)
Monocytes Absolute: 0.4 K/uL (ref 0.1–1.0)
Monocytes Relative: 7 %
Neutro Abs: 3.3 K/uL (ref 1.7–7.7)
Neutrophils Relative %: 57 %
Platelet Count: 220 K/uL (ref 150–400)
RBC: 3.72 MIL/uL — ABNORMAL LOW (ref 3.87–5.11)
RDW: 13.8 % (ref 11.5–15.5)
WBC Count: 5.8 K/uL (ref 4.0–10.5)
nRBC: 0 % (ref 0.0–0.2)

## 2023-10-08 LAB — CMP (CANCER CENTER ONLY)
ALT: 13 U/L (ref 0–44)
AST: 17 U/L (ref 15–41)
Albumin: 4.1 g/dL (ref 3.5–5.0)
Alkaline Phosphatase: 56 U/L (ref 38–126)
Anion gap: 5 (ref 5–15)
BUN: 15 mg/dL (ref 8–23)
CO2: 30 mmol/L (ref 22–32)
Calcium: 9.7 mg/dL (ref 8.9–10.3)
Chloride: 106 mmol/L (ref 98–111)
Creatinine: 0.97 mg/dL (ref 0.44–1.00)
GFR, Estimated: 59 mL/min — ABNORMAL LOW (ref >60)
Glucose, Bld: 96 mg/dL (ref 70–99)
Potassium: 3.6 mmol/L (ref 3.5–5.1)
Sodium: 141 mmol/L (ref 135–145)
Total Bilirubin: 0.4 mg/dL (ref 0.0–1.2)
Total Protein: 7.4 g/dL (ref 6.5–8.1)

## 2023-10-08 LAB — LACTATE DEHYDROGENASE: LDH: 170 U/L (ref 98–192)

## 2023-10-08 MED ORDER — ZOLEDRONIC ACID 4 MG/100ML IV SOLN
4.0000 mg | Freq: Once | INTRAVENOUS | Status: AC
  Administered 2023-10-08: 4 mg via INTRAVENOUS
  Filled 2023-10-08: qty 100

## 2023-10-08 NOTE — Patient Instructions (Signed)

## 2023-10-09 DIAGNOSIS — M25611 Stiffness of right shoulder, not elsewhere classified: Secondary | ICD-10-CM | POA: Diagnosis not present

## 2023-10-09 DIAGNOSIS — M6281 Muscle weakness (generalized): Secondary | ICD-10-CM | POA: Diagnosis not present

## 2023-10-09 DIAGNOSIS — M19011 Primary osteoarthritis, right shoulder: Secondary | ICD-10-CM | POA: Diagnosis not present

## 2023-10-09 LAB — KAPPA/LAMBDA LIGHT CHAINS
Kappa free light chain: 83.6 mg/L — ABNORMAL HIGH (ref 3.3–19.4)
Kappa, lambda light chain ratio: 4.92 — ABNORMAL HIGH (ref 0.26–1.65)
Lambda free light chains: 17 mg/L (ref 5.7–26.3)

## 2023-10-09 LAB — IGG, IGA, IGM
IgA: 359 mg/dL (ref 64–422)
IgG (Immunoglobin G), Serum: 1508 mg/dL (ref 586–1602)
IgM (Immunoglobulin M), Srm: 64 mg/dL (ref 26–217)

## 2023-10-09 LAB — BETA 2 MICROGLOBULIN, SERUM: Beta-2 Microglobulin: 1.6 mg/L (ref 0.6–2.4)

## 2023-10-27 ENCOUNTER — Telehealth: Payer: Self-pay

## 2023-10-27 NOTE — Telephone Encounter (Signed)
 Tried to reach patient in regards to lab appt tomorrow.  LVM informing patient that she had labs drew on 10/06/23, so no other labs needs to be drew tomorrow.  If any questions, asked to return my call.

## 2023-10-28 ENCOUNTER — Encounter: Payer: Self-pay | Admitting: Physician Assistant

## 2023-10-28 ENCOUNTER — Inpatient Hospital Stay (HOSPITAL_BASED_OUTPATIENT_CLINIC_OR_DEPARTMENT_OTHER): Admitting: Internal Medicine

## 2023-10-28 ENCOUNTER — Other Ambulatory Visit

## 2023-10-28 VITALS — BP 142/79 | HR 72 | Temp 97.6°F | Resp 16 | Ht 65.0 in | Wt 152.0 lb

## 2023-10-28 DIAGNOSIS — Z9221 Personal history of antineoplastic chemotherapy: Secondary | ICD-10-CM | POA: Diagnosis not present

## 2023-10-28 DIAGNOSIS — K529 Noninfective gastroenteritis and colitis, unspecified: Secondary | ICD-10-CM | POA: Diagnosis not present

## 2023-10-28 DIAGNOSIS — D649 Anemia, unspecified: Secondary | ICD-10-CM | POA: Diagnosis not present

## 2023-10-28 DIAGNOSIS — Z79899 Other long term (current) drug therapy: Secondary | ICD-10-CM | POA: Diagnosis not present

## 2023-10-28 DIAGNOSIS — C9000 Multiple myeloma not having achieved remission: Secondary | ICD-10-CM | POA: Diagnosis not present

## 2023-10-28 DIAGNOSIS — Z9484 Stem cells transplant status: Secondary | ICD-10-CM | POA: Diagnosis not present

## 2023-10-28 NOTE — Progress Notes (Signed)
 [facility] [phone]   [phone]  OFFICE PROGRESS NOTE  Carmen Nottingham, MD [address] [place] [address]  DIAGNOSIS: Multiple myeloma, Light chain diagnosed in August 2010  PRIOR THERAPY: 1) status post systemic chemotherapy with Velcade, Doxil and Decadron  between 04/24/2009 through 07/27/2009 discontinued secondary to peripheral neuropathy. 2) status post treatment with Revlimid  and Decadron  between 09/01/2009 through December 2011. 3) status post peripheral blood autologous stem cell transplant on 04/26/2010 at [facility] under the care of Dr. Nicki. 4) maintenance chemotherapy with Revlimid  10 mg by mouth daily started June 2012. This will be changed to 5 mg by mouth daily starting next week secondary to persistent diarrhea.  CURRENT THERAPY: 1) Zometa  4 mg IV every 3 months.  INTERVAL HISTORY: Carmen Fisher 83 y.o. female returns to the clinic for 47-month follow-up visit.Discussed the use of AI scribe software for clinical note transcription with the patient, who gave verbal consent to proceed.  History of Present Illness Carmen Fisher is an 83 year old female with multiple myeloma who presents for evaluation and repeat myeloma panel.  She was diagnosed with multiple myeloma in August 2010 and has undergone several treatments, including an autologous stem cell transplant in January 2012. She was previously on maintenance treatment with Revlimid , which was discontinued due to persistent diarrhea. She is currently on observation and receives Zometa  infusions every three months. Recent myeloma panel shows the kappa light chain level increased from 62 and 66 in January to 83, and the kappa lambda ratio is 4.92. Her IgG level is 1158, and her hemoglobin is 11.6, similar to previous results.  She experiences persistent diarrhea, which has been ongoing for many years despite various treatments. Potassium levels are  currently normal at 3.6.  She mentions experiencing arthritis-like symptoms in her right shoulder, which she attributes to aging. This is a new complaint as she did not have this issue before. No nausea or vomiting.    MEDICAL HISTORY: Past Medical History:  Diagnosis Date   Dyslipidemia    GERD (gastroesophageal reflux disease)    Gout    09-05-2017  per pt stable,  last flare-up long time ago   H/O autologous stem cell transplant (HCC) 04-26-2010   @ [facility]   History of acute pyelonephritis 08/16/2017   History of chemotherapy 04-24-2009  to 07-27-2009   systemic   History of sepsis    09/ 2014 secondary to UTI/  08-16-2017  SIRS, severe sepsis due to acute pyelonephritis with kidney stone obstruction   Hypokalemia    Intermittent diarrhea    Multiple myeloma in remission [facility]) oncologist-  dr sherrod ([facility] ) and dr nicki at [facility]   dx 08/ 2010-- Kappa Light Chain, completed chemo 07-27-2009 , s/p  high dose melphalan and autologous stem cell transplant 04-26-2010 @ [facility],  maintenance oral chemo (revlimid ) started 06/ 2012--stopped 03/ 2019 due to neutropenia and diarrhea,  Zometa  IV every 3 months   Osteoarthritis    Peripheral neuropathy due to chemotherapy [facility])    Renal calculus, right    Right ureteral calculus    Wears dentures    upper   Wears glasses     ALLERGIES:  is allergic to aspirin, ampicillin, sulfa antibiotics, and penicillins.  MEDICATIONS:  Current Outpatient Medications  Medication Sig Dispense Refill   allopurinol  (ZYLOPRIM ) 300 MG tablet Take 1 tablet by mouth daily.     cholecalciferol  (VITAMIN D ) 1000 UNITS  tablet Take 1,000 Units by mouth daily.     diphenoxylate -atropine  (LOMOTIL ) 2.5-0.025 MG tablet 1 tablet as needed     dorzolamide -timolol  (COSOPT ) 22.3-6.8 MG/ML ophthalmic solution Place 1 drop into both eyes 2 (two) times daily.      gabapentin  (NEURONTIN ) 600 MG tablet Take 1 tablet (600 mg total) by mouth 2 (two) times daily.  60 tablet 0   latanoprost  (XALATAN ) 0.005 % ophthalmic solution Place 1 drop into both eyes at bedtime.      LORazepam (ATIVAN) 1 MG tablet Take 1 mg by mouth every 4 (four) hours as needed (neuropathy). Reported on 09/14/2015 (Patient not taking: Reported on 10/16/2022)     Multiple Vitamin (MULTIVITAMIN WITH MINERALS) TABS tablet Take 1 tablet by mouth daily.     pantoprazole  (PROTONIX ) 40 MG tablet Take 1 tablet (40 mg total) by mouth daily. (Patient taking differently: Take 20 mg by mouth daily. Takes 1/2 tablet) 90 tablet 0   potassium chloride  SA (KLOR-CON  M) 20 MEQ tablet Take 1 tablet (20 mEq total) by mouth daily. 10 tablet 0   No current facility-administered medications for this visit.    SURGICAL HISTORY:  Past Surgical History:  Procedure Laterality Date   APPENDECTOMY  1990s   CARPAL TUNNEL RELEASE Left 10-14-2001   [facility]   AND A-1 PULLEY RELEASE LEFT RING FINGER   CYSTOSCOPY W/ URETERAL STENT PLACEMENT Right 08/16/2017   Procedure: CYSTOSCOPY WITH RETROGRADE PYELOGRAM/URETERAL STENT PLACEMENT;  Surgeon: Cam Morene ORN, MD;  Location: [facility] OR;  Service: Urology;  Laterality: Right;   CYSTOSCOPY/RETROGRADE/URETEROSCOPY Right 09/08/2017   Procedure: CYSTOSCOPY/RETROGRADE/URETEROSCOPY/STONE EXTRACTION/ STENT EXCHANGE, STONE BASKET RETRIVAL, LASER;  Surgeon: Ottelin, Mark, MD;  Location: [facility];  Service: Urology;  Laterality: Right;   VAGINAL HYSTERECTOMY  1994   W/  BSO    REVIEW OF SYSTEMS:  Constitutional: negative Eyes: negative Ears, nose, mouth, throat, and face: negative Respiratory: negative Cardiovascular: negative Gastrointestinal: positive for diarrhea Genitourinary:negative Integument/breast: negative Hematologic/lymphatic: negative Musculoskeletal:positive for arthralgias Neurological: negative Behavioral/Psych: negative Endocrine: negative Allergic/Immunologic: negative   PHYSICAL EXAMINATION: General appearance: alert, cooperative,  and no distress Head: Normocephalic, without obvious abnormality, atraumatic Neck: no adenopathy, no JVD, supple, symmetrical, trachea midline, and thyroid  not enlarged, symmetric, no tenderness/mass/nodules Lymph nodes: Cervical, supraclavicular, and axillary nodes normal. Resp: clear to auscultation bilaterally Back: symmetric, no curvature. ROM normal. No CVA tenderness. Cardio: regular rate and rhythm, S1, S2 normal, no murmur, click, rub or gallop GI: soft, non-tender; bowel sounds normal; no masses,  no organomegaly Extremities: extremities normal, atraumatic, no cyanosis or edema Neurologic: Alert and oriented X 3, normal strength and tone. Normal symmetric reflexes. Normal coordination and gait  ECOG PERFORMANCE STATUS: 1 - Symptomatic but completely ambulatory  Blood pressure (!) 142/79, pulse 72, temperature 97.6 F (36.4 C), temperature source Temporal, resp. rate 16, height 5' 5 (1.651 m), weight 152 lb (68.9 kg), SpO2 98%.  LABORATORY DATA: Lab Results  Component Value Date   WBC 5.8 10/08/2023   HGB 11.6 (L) 10/08/2023   HCT 35.2 (L) 10/08/2023   MCV 94.6 10/08/2023   PLT 220 10/08/2023      Chemistry      Component Value Date/Time   NA 141 10/08/2023 1036   NA 141 03/06/2017 1024   K 3.6 10/08/2023 1036   K 3.9 03/06/2017 1024   CL 106 10/08/2023 1036   CL 106 07/28/2012 0924   CO2 30 10/08/2023 1036   CO2 25 03/06/2017 1024   BUN 15 10/08/2023  1036   BUN 14.3 03/06/2017 1024   CREATININE 0.97 10/08/2023 1036   CREATININE 1.0 03/06/2017 1024      Component Value Date/Time   CALCIUM 9.7 10/08/2023 1036   CALCIUM 9.8 03/06/2017 1024   ALKPHOS 56 10/08/2023 1036   ALKPHOS 74 03/06/2017 1024   AST 17 10/08/2023 1036   AST 16 03/06/2017 1024   ALT 13 10/08/2023 1036   ALT 15 03/06/2017 1024   BILITOT 0.4 10/08/2023 1036   BILITOT 0.57 03/06/2017 1024       RADIOGRAPHIC STUDIES: No results found.  ASSESSMENT AND PLAN:  This is a very pleasant 83  years old African-American female with multiple myeloma and has been on maintenance Revlimid  currently 5 mg by mouth daily and has been on this treatment for almost 6 years. Her treatment was discontinued secondary to neutropenia and persistent diarrhea.   The patient is currently on observation and she is feeling fine except for the persistent diarrhea that continued even after discontinuation of her treatment with Revlimid  and she is currently on Lomotil  and Imodium . Assessment and Plan Assessment & Plan Multiple myeloma Multiple myeloma diagnosed in August 2010, currently on observation and receiving Zometa  infusions every three months. Recent myeloma panel shows an increase in kappa light chain from 66 in January to 83, but the kappa lambda ratio remains stable at 4.92. IgG levels are normal, and there are no renal issues. Mild anemia is well-managed with hemoglobin at 11.6. Overall, the condition is stable, and no changes in management are necessary at this time. - Continue Zometa  infusions every three months - Monitor myeloma panel for any further changes - Consider bone marrow biopsy if kappa light chain continues to increase  Mild anemia Mild anemia with hemoglobin at 11.6, consistent with previous levels. No new interventions required as it remains stable.  Chronic diarrhea Persistent diarrhea for several years, possibly contributing to hypokalemia. No new interventions discussed as the diarrhea is a known chronic issue. - Ensure dietary intake is rich in potassium (e.g., bananas, orange juice) She was advised to call immediately if she has any concerning symptoms in the interval. The patient voices understanding of current disease status and treatment options and is in agreement with the current care plan.  All questions were answered. The patient knows to call the clinic with any problems, questions or concerns. We can certainly see the patient much sooner if necessary.  Disclaimer:  This note was dictated with voice recognition software. Similar sounding words can inadvertently be transcribed and may not be corrected upon review.

## 2023-11-19 DIAGNOSIS — I10 Essential (primary) hypertension: Secondary | ICD-10-CM | POA: Diagnosis not present

## 2024-01-07 ENCOUNTER — Inpatient Hospital Stay: Payer: 59

## 2024-01-07 ENCOUNTER — Inpatient Hospital Stay: Payer: 59 | Attending: Internal Medicine

## 2024-01-07 VITALS — BP 140/72 | HR 64 | Temp 97.6°F | Resp 18

## 2024-01-07 DIAGNOSIS — C9000 Multiple myeloma not having achieved remission: Secondary | ICD-10-CM | POA: Diagnosis present

## 2024-01-07 DIAGNOSIS — Z79899 Other long term (current) drug therapy: Secondary | ICD-10-CM | POA: Diagnosis not present

## 2024-01-07 LAB — CBC WITH DIFFERENTIAL (CANCER CENTER ONLY)
Abs Immature Granulocytes: 0.01 K/uL (ref 0.00–0.07)
Basophils Absolute: 0 K/uL (ref 0.0–0.1)
Basophils Relative: 0 %
Eosinophils Absolute: 0 K/uL (ref 0.0–0.5)
Eosinophils Relative: 1 %
HCT: 34.8 % — ABNORMAL LOW (ref 36.0–46.0)
Hemoglobin: 11.8 g/dL — ABNORMAL LOW (ref 12.0–15.0)
Immature Granulocytes: 0 %
Lymphocytes Relative: 37 %
Lymphs Abs: 1.9 K/uL (ref 0.7–4.0)
MCH: 31.4 pg (ref 26.0–34.0)
MCHC: 33.9 g/dL (ref 30.0–36.0)
MCV: 92.6 fL (ref 80.0–100.0)
Monocytes Absolute: 0.5 K/uL (ref 0.1–1.0)
Monocytes Relative: 10 %
Neutro Abs: 2.7 K/uL (ref 1.7–7.7)
Neutrophils Relative %: 52 %
Platelet Count: 220 K/uL (ref 150–400)
RBC: 3.76 MIL/uL — ABNORMAL LOW (ref 3.87–5.11)
RDW: 13.9 % (ref 11.5–15.5)
WBC Count: 5.1 K/uL (ref 4.0–10.5)
nRBC: 0 % (ref 0.0–0.2)

## 2024-01-07 LAB — CMP (CANCER CENTER ONLY)
ALT: 13 U/L (ref 0–44)
AST: 16 U/L (ref 15–41)
Albumin: 4.1 g/dL (ref 3.5–5.0)
Alkaline Phosphatase: 49 U/L (ref 38–126)
Anion gap: 4 — ABNORMAL LOW (ref 5–15)
BUN: 9 mg/dL (ref 8–23)
CO2: 30 mmol/L (ref 22–32)
Calcium: 9.7 mg/dL (ref 8.9–10.3)
Chloride: 107 mmol/L (ref 98–111)
Creatinine: 0.67 mg/dL (ref 0.44–1.00)
GFR, Estimated: >60 mL/min (ref >60)
Glucose, Bld: 94 mg/dL (ref 70–99)
Potassium: 3.3 mmol/L — ABNORMAL LOW (ref 3.5–5.1)
Sodium: 141 mmol/L (ref 135–145)
Total Bilirubin: 0.5 mg/dL (ref 0.0–1.2)
Total Protein: 7.6 g/dL (ref 6.5–8.1)

## 2024-01-07 LAB — LACTATE DEHYDROGENASE: LDH: 156 U/L (ref 98–192)

## 2024-01-07 MED ORDER — ZOLEDRONIC ACID 4 MG/100ML IV SOLN
4.0000 mg | Freq: Once | INTRAVENOUS | Status: AC
  Administered 2024-01-07: 4 mg via INTRAVENOUS
  Filled 2024-01-07: qty 100

## 2024-01-07 MED ORDER — SODIUM CHLORIDE 0.9% FLUSH: 10.0000 mL | Freq: Once | INTRAVENOUS | Status: DC | PRN

## 2024-01-07 NOTE — Patient Instructions (Signed)

## 2024-01-08 DIAGNOSIS — R42 Dizziness and giddiness: Secondary | ICD-10-CM | POA: Diagnosis not present

## 2024-01-08 LAB — IGG, IGA, IGM
IgA: 342 mg/dL (ref 64–422)
IgG (Immunoglobin G), Serum: 1345 mg/dL (ref 586–1602)
IgM (Immunoglobulin M), Srm: 57 mg/dL (ref 26–217)

## 2024-01-08 LAB — KAPPA/LAMBDA LIGHT CHAINS
Kappa free light chain: 94.8 mg/L — ABNORMAL HIGH (ref 3.3–19.4)
Kappa, lambda light chain ratio: 5.64 — ABNORMAL HIGH (ref 0.26–1.65)
Lambda free light chains: 16.8 mg/L (ref 5.7–26.3)

## 2024-01-08 LAB — BETA 2 MICROGLOBULIN, SERUM: Beta-2 Microglobulin: 1.6 mg/L (ref 0.6–2.4)

## 2024-01-13 DIAGNOSIS — H8110 Benign paroxysmal vertigo, unspecified ear: Secondary | ICD-10-CM | POA: Diagnosis not present

## 2024-02-04 ENCOUNTER — Other Ambulatory Visit: Payer: Self-pay | Admitting: Internal Medicine

## 2024-02-04 DIAGNOSIS — Z1231 Encounter for screening mammogram for malignant neoplasm of breast: Secondary | ICD-10-CM

## 2024-03-02 ENCOUNTER — Ambulatory Visit
Admission: RE | Admit: 2024-03-02 | Discharge: 2024-03-02 | Disposition: A | Source: Ambulatory Visit | Attending: Internal Medicine | Admitting: Internal Medicine

## 2024-03-02 ENCOUNTER — Encounter: Payer: Self-pay | Admitting: Physician Assistant

## 2024-03-02 DIAGNOSIS — Z1231 Encounter for screening mammogram for malignant neoplasm of breast: Secondary | ICD-10-CM

## 2024-04-27 ENCOUNTER — Other Ambulatory Visit: Payer: Self-pay

## 2024-04-27 DIAGNOSIS — C9000 Multiple myeloma not having achieved remission: Secondary | ICD-10-CM

## 2024-04-28 ENCOUNTER — Telehealth: Payer: Self-pay | Admitting: Internal Medicine

## 2024-04-28 ENCOUNTER — Inpatient Hospital Stay: Attending: Internal Medicine

## 2024-04-28 ENCOUNTER — Inpatient Hospital Stay: Admitting: Internal Medicine

## 2024-04-28 ENCOUNTER — Encounter: Payer: Self-pay | Admitting: Physician Assistant

## 2024-04-28 ENCOUNTER — Inpatient Hospital Stay

## 2024-04-28 VITALS — BP 119/77 | HR 66 | Temp 98.0°F | Resp 17 | Wt 154.0 lb

## 2024-04-28 DIAGNOSIS — C9000 Multiple myeloma not having achieved remission: Secondary | ICD-10-CM

## 2024-04-28 DIAGNOSIS — Z79899 Other long term (current) drug therapy: Secondary | ICD-10-CM | POA: Diagnosis not present

## 2024-04-28 LAB — CMP (CANCER CENTER ONLY)
ALT: 13 U/L (ref 0–44)
AST: 20 U/L (ref 15–41)
Albumin: 4.3 g/dL (ref 3.5–5.0)
Alkaline Phosphatase: 62 U/L (ref 38–126)
Anion gap: 10 (ref 5–15)
BUN: 14 mg/dL (ref 8–23)
CO2: 28 mmol/L (ref 22–32)
Calcium: 9.7 mg/dL (ref 8.9–10.3)
Chloride: 104 mmol/L (ref 98–111)
Creatinine: 0.97 mg/dL (ref 0.44–1.00)
GFR, Estimated: 59 mL/min — ABNORMAL LOW
Glucose, Bld: 97 mg/dL (ref 70–99)
Potassium: 3.7 mmol/L (ref 3.5–5.1)
Sodium: 142 mmol/L (ref 135–145)
Total Bilirubin: 0.3 mg/dL (ref 0.0–1.2)
Total Protein: 7.3 g/dL (ref 6.5–8.1)

## 2024-04-28 LAB — CBC WITH DIFFERENTIAL (CANCER CENTER ONLY)
Abs Immature Granulocytes: 0.01 10*3/uL (ref 0.00–0.07)
Basophils Absolute: 0 10*3/uL (ref 0.0–0.1)
Basophils Relative: 0 %
Eosinophils Absolute: 0 10*3/uL (ref 0.0–0.5)
Eosinophils Relative: 1 %
HCT: 35.3 % — ABNORMAL LOW (ref 36.0–46.0)
Hemoglobin: 11.6 g/dL — ABNORMAL LOW (ref 12.0–15.0)
Immature Granulocytes: 0 %
Lymphocytes Relative: 29 %
Lymphs Abs: 2 10*3/uL (ref 0.7–4.0)
MCH: 31.3 pg (ref 26.0–34.0)
MCHC: 32.9 g/dL (ref 30.0–36.0)
MCV: 95.1 fL (ref 80.0–100.0)
Monocytes Absolute: 0.5 10*3/uL (ref 0.1–1.0)
Monocytes Relative: 8 %
Neutro Abs: 4.3 10*3/uL (ref 1.7–7.7)
Neutrophils Relative %: 62 %
Platelet Count: 216 10*3/uL (ref 150–400)
RBC: 3.71 MIL/uL — ABNORMAL LOW (ref 3.87–5.11)
RDW: 14.1 % (ref 11.5–15.5)
WBC Count: 6.9 10*3/uL (ref 4.0–10.5)
nRBC: 0 % (ref 0.0–0.2)

## 2024-04-28 LAB — LACTATE DEHYDROGENASE: LDH: 180 U/L (ref 105–235)

## 2024-04-28 MED ORDER — SODIUM CHLORIDE 0.9 % IV SOLN: Freq: Once | INTRAVENOUS | Status: AC

## 2024-04-28 MED ORDER — ZOLEDRONIC ACID 4 MG/100ML IV SOLN
4.0000 mg | Freq: Once | INTRAVENOUS | Status: AC
  Administered 2024-04-28: 4 mg via INTRAVENOUS
  Filled 2024-04-28: qty 100

## 2024-04-28 NOTE — Telephone Encounter (Signed)
 Pt called to sched her appts had to resched her 2nd infusion on the 7/28 to 7/10 because pt was gonna be out of town in April and it threw the treatment off by  2 weeks.

## 2024-04-28 NOTE — Progress Notes (Signed)
 "     [facility] Telephone:(336) [phone]   Fax:(336) [phone]  OFFICE PROGRESS NOTE  Carmen Nottingham, MD [address] [place] [address]  DIAGNOSIS: Multiple myeloma, Light chain diagnosed in August 2010  PRIOR THERAPY: 1) status post systemic chemotherapy with Velcade, Doxil and Decadron  between 04/24/2009 through 07/27/2009 discontinued secondary to peripheral neuropathy. 2) status post treatment with Revlimid  and Decadron  between 09/01/2009 through December 2011. 3) status post peripheral blood autologous stem cell transplant on 04/26/2010 at [facility] under the care of Dr. Nicki. 4) maintenance chemotherapy with Revlimid  10 mg by mouth daily started June 2012. This will be changed to 5 mg by mouth daily starting next week secondary to persistent diarrhea.  CURRENT THERAPY: 1) Zometa  4 mg IV every 3 months.  INTERVAL HISTORY: Carmen Fisher 84 y.o. female returns to the clinic for 7-month follow-up visit.Discussed the use of AI scribe software for clinical note transcription with the patient, who gave verbal consent to proceed.  History of Present Illness Carmen Fisher is an 84 year old female with multiple myeloma who presents for evaluation and repeat myeloma panel.  She was diagnosed with multiple myeloma in August 2010 and has received Velcade, Doxil, Decadron , and autologous stem cell transplant in January 2012. Maintenance therapy with Revlimid  was discontinued due to persistent diarrhea. She is currently managed with observation and Zometa  infusions every three months, with her most recent infusion scheduled for today.  She continues to experience chronic diarrhea, which has persisted despite discontinuation of Revlimid . She has not pursued further gastrointestinal evaluation and does not associate her symptoms with Zometa , as the timing and frequency do not correlate. She denies other gastrointestinal or systemic symptoms.  She  feels well overall and denies any new complaints since her last visit.     MEDICAL HISTORY: Past Medical History:  Diagnosis Date   Dyslipidemia    GERD (gastroesophageal reflux disease)    Gout    09-05-2017  per pt stable,  last flare-up long time ago   H/O autologous stem cell transplant (HCC) 04-26-2010   @ [facility]   History of acute pyelonephritis 08/16/2017   History of chemotherapy 04-24-2009  to 07-27-2009   systemic   History of sepsis    09/ 2014 secondary to UTI/  08-16-2017  SIRS, severe sepsis due to acute pyelonephritis with kidney stone obstruction   Hypokalemia    Intermittent diarrhea    Multiple myeloma in remission [facility]) oncologist-  dr sherrod ([facility] ) and dr nicki at [facility]   dx 08/ 2010-- Kappa Light Chain, completed chemo 07-27-2009 , s/p  high dose melphalan and autologous stem cell transplant 04-26-2010 @ [facility],  maintenance oral chemo (revlimid ) started 06/ 2012--stopped 03/ 2019 due to neutropenia and diarrhea,  Zometa  IV every 3 months   Osteoarthritis    Peripheral neuropathy due to chemotherapy    Renal calculus, right    Right ureteral calculus    Wears dentures    upper   Wears glasses     ALLERGIES:  is allergic to aspirin, ampicillin, sulfa antibiotics, and penicillins.  MEDICATIONS:  Current Outpatient Medications  Medication Sig Dispense Refill   allopurinol  (ZYLOPRIM ) 300 MG tablet Take 1 tablet by mouth daily.     cholecalciferol  (VITAMIN D ) 1000 UNITS tablet Take 1,000 Units by mouth daily.     diphenoxylate -atropine  (LOMOTIL ) 2.5-0.025 MG tablet 1 tablet as needed     dorzolamide -timolol  (COSOPT ) 22.3-6.8 MG/ML ophthalmic solution Place 1 drop  into both eyes 2 (two) times daily.      gabapentin  (NEURONTIN ) 600 MG tablet Take 1 tablet (600 mg total) by mouth 2 (two) times daily. 60 tablet 0   latanoprost  (XALATAN ) 0.005 % ophthalmic solution Place 1 drop into both eyes at bedtime.      LORazepam (ATIVAN) 1 MG tablet Take 1 mg  by mouth every 4 (four) hours as needed (neuropathy). Reported on 09/14/2015     Multiple Vitamin (MULTIVITAMIN WITH MINERALS) TABS tablet Take 1 tablet by mouth daily.     pantoprazole  (PROTONIX ) 40 MG tablet Take 1 tablet (40 mg total) by mouth daily. (Patient taking differently: Take 20 mg by mouth daily. Takes 1/2 tablet) 90 tablet 0   potassium chloride  SA (KLOR-CON  M) 20 MEQ tablet Take 1 tablet (20 mEq total) by mouth daily. 10 tablet 0   No current facility-administered medications for this visit.    SURGICAL HISTORY:  Past Surgical History:  Procedure Laterality Date   APPENDECTOMY  1990s   CARPAL TUNNEL RELEASE Left 10-14-2001   [facility]   AND A-1 PULLEY RELEASE LEFT RING FINGER   CYSTOSCOPY W/ URETERAL STENT PLACEMENT Right 08/16/2017   Procedure: CYSTOSCOPY WITH RETROGRADE PYELOGRAM/URETERAL STENT PLACEMENT;  Surgeon: Cam Morene ORN, MD;  Location: [facility] OR;  Service: Urology;  Laterality: Right;   CYSTOSCOPY/RETROGRADE/URETEROSCOPY Right 09/08/2017   Procedure: CYSTOSCOPY/RETROGRADE/URETEROSCOPY/STONE EXTRACTION/ STENT EXCHANGE, STONE BASKET RETRIVAL, LASER;  Surgeon: Ottelin, Mark, MD;  Location: [facility];  Service: Urology;  Laterality: Right;   VAGINAL HYSTERECTOMY  1994   W/  BSO    REVIEW OF SYSTEMS:  A comprehensive review of systems was negative except for: Constitutional: positive for fatigue Gastrointestinal: positive for diarrhea   PHYSICAL EXAMINATION: General appearance: alert, cooperative, and no distress Head: Normocephalic, without obvious abnormality, atraumatic Neck: no adenopathy, no JVD, supple, symmetrical, trachea midline, and thyroid  not enlarged, symmetric, no tenderness/mass/nodules Lymph nodes: Cervical, supraclavicular, and axillary nodes normal. Resp: clear to auscultation bilaterally Back: symmetric, no curvature. ROM normal. No CVA tenderness. Cardio: regular rate and rhythm, S1, S2 normal, no murmur, click, rub or gallop GI:  soft, non-tender; bowel sounds normal; no masses,  no organomegaly Extremities: extremities normal, atraumatic, no cyanosis or edema  ECOG PERFORMANCE STATUS: 1 - Symptomatic but completely ambulatory  Blood pressure 119/77, pulse 66, temperature 98 F (36.7 C), temperature source Temporal, resp. rate 17, weight 154 lb (69.9 kg), SpO2 99%.  LABORATORY DATA: Lab Results  Component Value Date   WBC 6.9 04/28/2024   HGB 11.6 (L) 04/28/2024   HCT 35.3 (L) 04/28/2024   MCV 95.1 04/28/2024   PLT 216 04/28/2024      Chemistry      Component Value Date/Time   NA 141 01/07/2024 0829   NA 141 03/06/2017 1024   K 3.3 (L) 01/07/2024 0829   K 3.9 03/06/2017 1024   CL 107 01/07/2024 0829   CL 106 07/28/2012 0924   CO2 30 01/07/2024 0829   CO2 25 03/06/2017 1024   BUN 9 01/07/2024 0829   BUN 14.3 03/06/2017 1024   CREATININE 0.67 01/07/2024 0829   CREATININE 1.0 03/06/2017 1024      Component Value Date/Time   CALCIUM 9.7 01/07/2024 0829   CALCIUM 9.8 03/06/2017 1024   ALKPHOS 49 01/07/2024 0829   ALKPHOS 74 03/06/2017 1024   AST 16 01/07/2024 0829   AST 16 03/06/2017 1024   ALT 13 01/07/2024 0829   ALT 15 03/06/2017 1024   BILITOT 0.5  01/07/2024 0829   BILITOT 0.57 03/06/2017 1024       RADIOGRAPHIC STUDIES: No results found.  ASSESSMENT AND PLAN:  This is a very pleasant 84 years old African-American female with multiple myeloma and has been on maintenance Revlimid  currently 5 mg by mouth daily and has been on this treatment for almost 6 years. Her treatment was discontinued secondary to neutropenia and persistent diarrhea.   The patient is currently on observation and she is feeling fine except for the persistent diarrhea that continued even after discontinuation of her treatment with Revlimid  and she is currently on Lomotil  and Imodium . Assessment and Plan Assessment & Plan Multiple myeloma Diagnosed in 2010, previously treated with bortezomib, doxorubicin liposomal,  dexamethasone , and autologous stem cell transplant. Maintenance lenalidomide  was discontinued due to persistent diarrhea. She is currently under observation and receives zoledronic  acid infusions every three months. She remains clinically well-managed with no new complaints except chronic diarrhea, which is not attributed to current therapy. Hematologic parameters are stable and recent laboratory evaluation revealed no concerning findings. Awaiting results of the full myeloma panel. - Ordered repeat myeloma panel. - Reviewed stable blood counts. - Administered zoledronic  acid infusion and will continue every three months. - Scheduled follow-up in six months with repeat blood work. She was advised to call immediately if she has any other concerning symptoms in the interval.  The patient voices understanding of current disease status and treatment options and is in agreement with the current care plan.  All questions were answered. The patient knows to call the clinic with any problems, questions or concerns. We can certainly see the patient much sooner if necessary.  Disclaimer: This note was dictated with voice recognition software. Similar sounding words can inadvertently be transcribed and may not be corrected upon review.       "

## 2024-04-28 NOTE — Patient Instructions (Signed)

## 2024-04-29 LAB — IGG, IGA, IGM
IgA: 324 mg/dL (ref 64–422)
IgG (Immunoglobin G), Serum: 1320 mg/dL (ref 586–1602)
IgM (Immunoglobulin M), Srm: 56 mg/dL (ref 26–217)

## 2024-04-29 LAB — BETA 2 MICROGLOBULIN, SERUM: Beta-2 Microglobulin: 1.5 mg/L (ref 0.6–2.4)

## 2024-07-09 ENCOUNTER — Inpatient Hospital Stay: Attending: Internal Medicine

## 2024-10-08 ENCOUNTER — Inpatient Hospital Stay: Attending: Internal Medicine

## 2024-10-26 ENCOUNTER — Inpatient Hospital Stay

## 2024-10-26 ENCOUNTER — Inpatient Hospital Stay: Admitting: Internal Medicine
